# Patient Record
Sex: Female | Born: 1935
Health system: Southern US, Community
[De-identification: ages and names within clinical notes are randomized; demographics above are authoritative.]

## PROBLEM LIST (undated history)

## (undated) DIAGNOSIS — E119 Type 2 diabetes mellitus without complications: Secondary | ICD-10-CM

## (undated) DIAGNOSIS — I10 Essential (primary) hypertension: Secondary | ICD-10-CM

## (undated) DIAGNOSIS — H269 Unspecified cataract: Secondary | ICD-10-CM

## (undated) DIAGNOSIS — J449 Chronic obstructive pulmonary disease, unspecified: Secondary | ICD-10-CM

## (undated) DIAGNOSIS — I428 Other cardiomyopathies: Secondary | ICD-10-CM

## (undated) DIAGNOSIS — I509 Heart failure, unspecified: Secondary | ICD-10-CM

## (undated) DIAGNOSIS — I4891 Unspecified atrial fibrillation: Secondary | ICD-10-CM

## (undated) DIAGNOSIS — T7840XA Allergy, unspecified, initial encounter: Secondary | ICD-10-CM

## (undated) HISTORY — PX: OTHER SURGICAL HISTORY: SHX169

## (undated) HISTORY — DX: Allergy, unspecified, initial encounter: T78.40XA

## (undated) HISTORY — DX: Unspecified cataract: H26.9

## (undated) HISTORY — DX: Chronic obstructive pulmonary disease, unspecified: J44.9

## (undated) HISTORY — PX: CHOLECYSTECTOMY: SHX55

## (undated) HISTORY — DX: Heart failure, unspecified: I50.9

---

## 2006-09-20 ENCOUNTER — Ambulatory Visit (HOSPITAL_COMMUNITY): Admission: RE | Admit: 2006-09-20 | Discharge: 2006-09-20 | Payer: Self-pay | Admitting: Ophthalmology

## 2006-10-30 ENCOUNTER — Ambulatory Visit (HOSPITAL_COMMUNITY): Admission: RE | Admit: 2006-10-30 | Discharge: 2006-10-30 | Payer: Self-pay | Admitting: Ophthalmology

## 2006-11-20 ENCOUNTER — Ambulatory Visit (HOSPITAL_COMMUNITY): Admission: RE | Admit: 2006-11-20 | Discharge: 2006-11-20 | Payer: Self-pay | Admitting: Ophthalmology

## 2012-05-06 ENCOUNTER — Encounter (HOSPITAL_COMMUNITY): Payer: Self-pay | Admitting: Pharmacy Technician

## 2012-05-07 ENCOUNTER — Ambulatory Visit (HOSPITAL_COMMUNITY)
Admission: RE | Admit: 2012-05-07 | Discharge: 2012-05-07 | Disposition: A | Discharge status: Home / Self Care | Payer: Medicare Other | Source: Ambulatory Visit | Attending: Ophthalmology | Admitting: Ophthalmology

## 2012-05-07 ENCOUNTER — Encounter (HOSPITAL_COMMUNITY): Payer: Self-pay

## 2012-05-07 ENCOUNTER — Encounter (HOSPITAL_COMMUNITY)
Admission: RE | Disposition: A | Discharge status: Home / Self Care | Payer: Self-pay | Source: Ambulatory Visit | Attending: Ophthalmology

## 2012-05-07 DIAGNOSIS — H26499 Other secondary cataract, unspecified eye: Secondary | ICD-10-CM | POA: Insufficient documentation

## 2012-05-07 SURGERY — TREATMENT, USING YAG LASER
Anesthesia: LOCAL | Laterality: Left

## 2012-05-07 MED ORDER — TROPICAMIDE 1 % OP SOLN
1.0000 [drp] | OPHTHALMIC | Status: AC
  Administered 2012-05-07 (×2): 1 [drp] via OPHTHALMIC

## 2012-05-07 MED ORDER — TROPICAMIDE 1 % OP SOLN
OPHTHALMIC | Status: AC
  Filled 2012-05-07: qty 3

## 2012-05-07 NOTE — Brief Op Note (Signed)
Leslie Kim 05/07/2012  [name] T. Nile Riggs, MD  Yag Laser Self Test Completedyes. Procedure: Posterior Capsulotomy, left eye.  Eye Protection Worn by Staff yes. Laser In Use Sign on Door yes.  Laser: Nd:YAG Spot Size: Fixed Burst Mode: III Power Setting: 3.7 mJ/burst  Number of shots: 13 Total energy delivered: 46.4 mJ  Patency of the peripheral iridotomy was confirmed visually.  The patient tolerated the procedure without difficulty. No complications were encountered.    The patient was discharged home with the instructions to continue all her current glaucoma medications, if any.   Patient instructed to go to office at 0100 for intraocular pressure check.  Patient verbalizes understanding of discharge instructions yes.

## 2012-05-07 NOTE — H&P (Signed)
The patient was re examined and there is no change in the patients condition since the original H and P. 

## 2012-05-15 ENCOUNTER — Encounter (HOSPITAL_COMMUNITY): Payer: Self-pay

## 2012-05-21 ENCOUNTER — Encounter (HOSPITAL_COMMUNITY)
Admission: RE | Disposition: A | Discharge status: Home / Self Care | Payer: Self-pay | Source: Ambulatory Visit | Attending: Ophthalmology

## 2012-05-21 ENCOUNTER — Encounter (HOSPITAL_COMMUNITY): Payer: Self-pay | Admitting: *Deleted

## 2012-05-21 ENCOUNTER — Ambulatory Visit (HOSPITAL_COMMUNITY)
Admission: RE | Admit: 2012-05-21 | Discharge: 2012-05-21 | Disposition: A | Discharge status: Home / Self Care | Payer: Medicare Other | Source: Ambulatory Visit | Attending: Ophthalmology | Admitting: Ophthalmology

## 2012-05-21 DIAGNOSIS — H26499 Other secondary cataract, unspecified eye: Secondary | ICD-10-CM | POA: Insufficient documentation

## 2012-05-21 SURGERY — TREATMENT, USING YAG LASER
Anesthesia: LOCAL | Laterality: Right

## 2012-05-21 MED ORDER — TROPICAMIDE 1 % OP SOLN
1.0000 [drp] | OPHTHALMIC | Status: AC
  Administered 2012-05-21 (×2): 1 [drp] via OPHTHALMIC

## 2012-05-21 MED ORDER — TROPICAMIDE 1 % OP SOLN
OPHTHALMIC | Status: AC
  Filled 2012-05-21: qty 3

## 2012-05-21 NOTE — Brief Op Note (Signed)
Leslie Kim 05/21/2012  Jeremaih Klima T. Nile Riggs, MD  Yag Laser Self Test Completedyes. Procedure: Posterior Capsulotomy, left eye.  Eye Protection Worn by Staff yes. Laser In Use Sign on Door yes.  Laser: Nd:YAG Spot Size: Fixed Burst Mode: III Power Setting: 3.3 mJ/burst  Number of shots: 16 Total energy delivered: 53.0 mJ  Patency of the peripheral iridotomy was confirmed visually.  The patient tolerated the procedure without difficulty. No complications were encountered.    The patient was discharged home with the instructions to continue all her current glaucoma medications, if any.   Patient instructed to go to office at 0100 for intraocular pressure check.  Patient verbalizes understanding of discharge instructions yes.

## 2012-05-21 NOTE — H&P (Signed)
The patient was re examined and there is no change in the patients condition since the original H and P. 

## 2013-03-25 ENCOUNTER — Encounter (HOSPITAL_COMMUNITY): Payer: Self-pay | Admitting: Anesthesiology

## 2014-02-19 ENCOUNTER — Other Ambulatory Visit (HOSPITAL_COMMUNITY): Payer: Self-pay | Admitting: Pulmonary Disease

## 2014-02-19 DIAGNOSIS — Z780 Asymptomatic menopausal state: Secondary | ICD-10-CM

## 2014-02-27 ENCOUNTER — Other Ambulatory Visit (HOSPITAL_COMMUNITY): Payer: Medicare Other

## 2014-09-22 DIAGNOSIS — K210 Gastro-esophageal reflux disease with esophagitis: Secondary | ICD-10-CM | POA: Diagnosis not present

## 2014-09-22 DIAGNOSIS — E1121 Type 2 diabetes mellitus with diabetic nephropathy: Secondary | ICD-10-CM | POA: Diagnosis not present

## 2014-09-22 DIAGNOSIS — I10 Essential (primary) hypertension: Secondary | ICD-10-CM | POA: Diagnosis not present

## 2014-09-22 DIAGNOSIS — J449 Chronic obstructive pulmonary disease, unspecified: Secondary | ICD-10-CM | POA: Diagnosis not present

## 2014-12-21 DIAGNOSIS — Z23 Encounter for immunization: Secondary | ICD-10-CM | POA: Diagnosis not present

## 2014-12-21 DIAGNOSIS — Z0000 Encounter for general adult medical examination without abnormal findings: Secondary | ICD-10-CM | POA: Diagnosis not present

## 2015-03-23 DIAGNOSIS — K210 Gastro-esophageal reflux disease with esophagitis: Secondary | ICD-10-CM | POA: Diagnosis not present

## 2015-03-23 DIAGNOSIS — J449 Chronic obstructive pulmonary disease, unspecified: Secondary | ICD-10-CM | POA: Diagnosis not present

## 2015-03-23 DIAGNOSIS — E1121 Type 2 diabetes mellitus with diabetic nephropathy: Secondary | ICD-10-CM | POA: Diagnosis not present

## 2015-03-23 DIAGNOSIS — I10 Essential (primary) hypertension: Secondary | ICD-10-CM | POA: Diagnosis not present

## 2015-03-24 DIAGNOSIS — J449 Chronic obstructive pulmonary disease, unspecified: Secondary | ICD-10-CM | POA: Diagnosis not present

## 2015-03-24 DIAGNOSIS — K210 Gastro-esophageal reflux disease with esophagitis: Secondary | ICD-10-CM | POA: Diagnosis not present

## 2015-03-24 DIAGNOSIS — E1121 Type 2 diabetes mellitus with diabetic nephropathy: Secondary | ICD-10-CM | POA: Diagnosis not present

## 2015-03-24 DIAGNOSIS — I10 Essential (primary) hypertension: Secondary | ICD-10-CM | POA: Diagnosis not present

## 2015-06-15 DIAGNOSIS — Z23 Encounter for immunization: Secondary | ICD-10-CM | POA: Diagnosis not present

## 2015-06-15 DIAGNOSIS — K210 Gastro-esophageal reflux disease with esophagitis: Secondary | ICD-10-CM | POA: Diagnosis not present

## 2015-06-15 DIAGNOSIS — I10 Essential (primary) hypertension: Secondary | ICD-10-CM | POA: Diagnosis not present

## 2015-06-15 DIAGNOSIS — J449 Chronic obstructive pulmonary disease, unspecified: Secondary | ICD-10-CM | POA: Diagnosis not present

## 2015-06-15 DIAGNOSIS — E1121 Type 2 diabetes mellitus with diabetic nephropathy: Secondary | ICD-10-CM | POA: Diagnosis not present

## 2015-10-13 DIAGNOSIS — I10 Essential (primary) hypertension: Secondary | ICD-10-CM | POA: Diagnosis not present

## 2015-10-13 DIAGNOSIS — K210 Gastro-esophageal reflux disease with esophagitis: Secondary | ICD-10-CM | POA: Diagnosis not present

## 2015-10-13 DIAGNOSIS — E1121 Type 2 diabetes mellitus with diabetic nephropathy: Secondary | ICD-10-CM | POA: Diagnosis not present

## 2015-10-13 DIAGNOSIS — J449 Chronic obstructive pulmonary disease, unspecified: Secondary | ICD-10-CM | POA: Diagnosis not present

## 2015-11-29 DIAGNOSIS — I4891 Unspecified atrial fibrillation: Secondary | ICD-10-CM

## 2015-11-29 HISTORY — DX: Unspecified atrial fibrillation: I48.91

## 2015-12-18 ENCOUNTER — Inpatient Hospital Stay (HOSPITAL_COMMUNITY)
Admission: EM | Admit: 2015-12-18 | Discharge: 2015-12-24 | DRG: 286 | Disposition: A | Discharge status: Home / Self Care | Payer: Medicare Other | Attending: Internal Medicine | Admitting: Internal Medicine

## 2015-12-18 ENCOUNTER — Emergency Department (HOSPITAL_COMMUNITY): Payer: Medicare Other

## 2015-12-18 ENCOUNTER — Encounter (HOSPITAL_COMMUNITY): Payer: Self-pay

## 2015-12-18 ENCOUNTER — Inpatient Hospital Stay (HOSPITAL_COMMUNITY): Payer: Medicare Other

## 2015-12-18 DIAGNOSIS — I259 Chronic ischemic heart disease, unspecified: Secondary | ICD-10-CM | POA: Diagnosis not present

## 2015-12-18 DIAGNOSIS — I509 Heart failure, unspecified: Secondary | ICD-10-CM | POA: Diagnosis not present

## 2015-12-18 DIAGNOSIS — K219 Gastro-esophageal reflux disease without esophagitis: Secondary | ICD-10-CM | POA: Diagnosis not present

## 2015-12-18 DIAGNOSIS — N179 Acute kidney failure, unspecified: Secondary | ICD-10-CM | POA: Diagnosis not present

## 2015-12-18 DIAGNOSIS — Z79899 Other long term (current) drug therapy: Secondary | ICD-10-CM | POA: Diagnosis not present

## 2015-12-18 DIAGNOSIS — E119 Type 2 diabetes mellitus without complications: Secondary | ICD-10-CM | POA: Diagnosis present

## 2015-12-18 DIAGNOSIS — I4891 Unspecified atrial fibrillation: Secondary | ICD-10-CM | POA: Diagnosis present

## 2015-12-18 DIAGNOSIS — R000 Tachycardia, unspecified: Secondary | ICD-10-CM | POA: Diagnosis not present

## 2015-12-18 DIAGNOSIS — I272 Other secondary pulmonary hypertension: Secondary | ICD-10-CM | POA: Diagnosis not present

## 2015-12-18 DIAGNOSIS — I248 Other forms of acute ischemic heart disease: Secondary | ICD-10-CM | POA: Diagnosis present

## 2015-12-18 DIAGNOSIS — E875 Hyperkalemia: Secondary | ICD-10-CM | POA: Diagnosis not present

## 2015-12-18 DIAGNOSIS — I5023 Acute on chronic systolic (congestive) heart failure: Secondary | ICD-10-CM | POA: Diagnosis not present

## 2015-12-18 DIAGNOSIS — Z87891 Personal history of nicotine dependence: Secondary | ICD-10-CM | POA: Diagnosis not present

## 2015-12-18 DIAGNOSIS — I428 Other cardiomyopathies: Secondary | ICD-10-CM

## 2015-12-18 DIAGNOSIS — E876 Hypokalemia: Secondary | ICD-10-CM | POA: Diagnosis not present

## 2015-12-18 DIAGNOSIS — I959 Hypotension, unspecified: Secondary | ICD-10-CM | POA: Diagnosis not present

## 2015-12-18 DIAGNOSIS — Z7984 Long term (current) use of oral hypoglycemic drugs: Secondary | ICD-10-CM

## 2015-12-18 DIAGNOSIS — I447 Left bundle-branch block, unspecified: Secondary | ICD-10-CM | POA: Diagnosis not present

## 2015-12-18 DIAGNOSIS — I480 Paroxysmal atrial fibrillation: Secondary | ICD-10-CM | POA: Diagnosis not present

## 2015-12-18 DIAGNOSIS — I110 Hypertensive heart disease with heart failure: Secondary | ICD-10-CM | POA: Diagnosis present

## 2015-12-18 DIAGNOSIS — Z7982 Long term (current) use of aspirin: Secondary | ICD-10-CM | POA: Diagnosis not present

## 2015-12-18 DIAGNOSIS — I5020 Unspecified systolic (congestive) heart failure: Secondary | ICD-10-CM | POA: Diagnosis not present

## 2015-12-18 DIAGNOSIS — I481 Persistent atrial fibrillation: Secondary | ICD-10-CM | POA: Diagnosis not present

## 2015-12-18 DIAGNOSIS — J189 Pneumonia, unspecified organism: Secondary | ICD-10-CM | POA: Diagnosis present

## 2015-12-18 DIAGNOSIS — I5021 Acute systolic (congestive) heart failure: Secondary | ICD-10-CM | POA: Diagnosis not present

## 2015-12-18 DIAGNOSIS — I429 Cardiomyopathy, unspecified: Secondary | ICD-10-CM | POA: Diagnosis present

## 2015-12-18 DIAGNOSIS — J440 Chronic obstructive pulmonary disease with acute lower respiratory infection: Secondary | ICD-10-CM | POA: Diagnosis not present

## 2015-12-18 DIAGNOSIS — Z7901 Long term (current) use of anticoagulants: Secondary | ICD-10-CM

## 2015-12-18 DIAGNOSIS — I10 Essential (primary) hypertension: Secondary | ICD-10-CM | POA: Diagnosis not present

## 2015-12-18 DIAGNOSIS — R0602 Shortness of breath: Secondary | ICD-10-CM | POA: Diagnosis not present

## 2015-12-18 DIAGNOSIS — R7989 Other specified abnormal findings of blood chemistry: Secondary | ICD-10-CM | POA: Diagnosis not present

## 2015-12-18 DIAGNOSIS — E118 Type 2 diabetes mellitus with unspecified complications: Secondary | ICD-10-CM | POA: Diagnosis not present

## 2015-12-18 DIAGNOSIS — I2510 Atherosclerotic heart disease of native coronary artery without angina pectoris: Secondary | ICD-10-CM | POA: Diagnosis not present

## 2015-12-18 DIAGNOSIS — R079 Chest pain, unspecified: Secondary | ICD-10-CM | POA: Diagnosis not present

## 2015-12-18 HISTORY — DX: Unspecified atrial fibrillation: I48.91

## 2015-12-18 HISTORY — DX: Type 2 diabetes mellitus without complications: E11.9

## 2015-12-18 HISTORY — DX: Essential (primary) hypertension: I10

## 2015-12-18 HISTORY — DX: Other cardiomyopathies: I42.8

## 2015-12-18 LAB — CBG MONITORING, ED: Glucose-Capillary: 313 mg/dL — ABNORMAL HIGH (ref 65–99)

## 2015-12-18 LAB — GLUCOSE, CAPILLARY
Glucose-Capillary: 291 mg/dL — ABNORMAL HIGH (ref 65–99)
Glucose-Capillary: 202 mg/dL — ABNORMAL HIGH (ref 65–99)
Glucose-Capillary: 143 mg/dL — ABNORMAL HIGH (ref 65–99)

## 2015-12-18 LAB — I-STAT CG4 LACTIC ACID, ED: Lactic Acid, Venous: 2.87 mmol/L (ref 0.5–2.0)

## 2015-12-18 LAB — CBC WITH DIFFERENTIAL/PLATELET
Basophils Absolute: 0 10*3/uL (ref 0.0–0.1)
Basophils Relative: 0 %
Eosinophils Absolute: 0 10*3/uL (ref 0.0–0.7)
Eosinophils Relative: 0 %
HEMATOCRIT: 33.3 % — AB (ref 36.0–46.0)
HEMOGLOBIN: 10.9 g/dL — AB (ref 12.0–15.0)
LYMPHS ABS: 2 10*3/uL (ref 0.7–4.0)
Lymphocytes Relative: 15 %
MCH: 26.4 pg (ref 26.0–34.0)
MCHC: 32.7 g/dL (ref 30.0–36.0)
MCV: 80.6 fL (ref 78.0–100.0)
MONO ABS: 0.6 10*3/uL (ref 0.1–1.0)
MONOS PCT: 5 %
NEUTROS ABS: 10.8 10*3/uL — AB (ref 1.7–7.7)
NEUTROS PCT: 80 %
Platelets: 242 10*3/uL (ref 150–400)
RBC: 4.13 MIL/uL (ref 3.87–5.11)
RDW: 15 % (ref 11.5–15.5)
WBC: 13.4 10*3/uL — ABNORMAL HIGH (ref 4.0–10.5)

## 2015-12-18 LAB — TSH: TSH: 3.456 u[IU]/mL (ref 0.350–4.500)

## 2015-12-18 LAB — MRSA PCR SCREENING: MRSA BY PCR: NEGATIVE

## 2015-12-18 LAB — TROPONIN I
TROPONIN I: 0.06 ng/mL — AB (ref <0.031)
Troponin I: 0.05 ng/mL — ABNORMAL HIGH (ref <0.031)
Troponin I: 0.11 ng/mL — ABNORMAL HIGH (ref <0.031)

## 2015-12-18 LAB — BRAIN NATRIURETIC PEPTIDE: B Natriuretic Peptide: 661 pg/mL — ABNORMAL HIGH (ref 0.0–100.0)

## 2015-12-18 LAB — BASIC METABOLIC PANEL
Anion gap: 14 (ref 5–15)
BUN: 29 mg/dL — AB (ref 6–20)
CHLORIDE: 94 mmol/L — AB (ref 101–111)
CO2: 21 mmol/L — ABNORMAL LOW (ref 22–32)
CREATININE: 1.28 mg/dL — AB (ref 0.44–1.00)
Calcium: 9.2 mg/dL (ref 8.9–10.3)
GFR calc Af Amer: 46 mL/min — ABNORMAL LOW (ref >60)
GFR calc non Af Amer: 39 mL/min — ABNORMAL LOW (ref >60)
GLUCOSE: 282 mg/dL — AB (ref 65–99)
POTASSIUM: 3.7 mmol/L (ref 3.5–5.1)
Sodium: 129 mmol/L — ABNORMAL LOW (ref 135–145)

## 2015-12-18 LAB — ECHOCARDIOGRAM COMPLETE
Height: 64 in
WEIGHTICAEL: 2800 [oz_av]

## 2015-12-18 LAB — MAGNESIUM: Magnesium: 1.4 mg/dL — ABNORMAL LOW (ref 1.7–2.4)

## 2015-12-18 MED ORDER — ONDANSETRON HCL 4 MG/2ML IJ SOLN: 4.0000 mg | Freq: Four times a day (QID) | INTRAMUSCULAR | Status: DC | PRN

## 2015-12-18 MED ORDER — DEXTROSE 5 % IV SOLN
1.0000 g | INTRAVENOUS | Status: DC
  Administered 2015-12-19 – 2015-12-21 (×3): 1 g via INTRAVENOUS
  Filled 2015-12-18 (×4): qty 10

## 2015-12-18 MED ORDER — ENOXAPARIN SODIUM 80 MG/0.8ML ~~LOC~~ SOLN
80.0000 mg | Freq: Two times a day (BID) | SUBCUTANEOUS | Status: DC
  Administered 2015-12-18 – 2015-12-22 (×9): 80 mg via SUBCUTANEOUS
  Filled 2015-12-18 (×11): qty 0.8

## 2015-12-18 MED ORDER — DEXTROSE 5 % IV SOLN
500.0000 mg | Freq: Once | INTRAVENOUS | Status: AC
  Administered 2015-12-18: 500 mg via INTRAVENOUS
  Filled 2015-12-18: qty 500

## 2015-12-18 MED ORDER — AMIODARONE HCL IN DEXTROSE 360-4.14 MG/200ML-% IV SOLN: 30.0000 mg/h | INTRAVENOUS | Status: DC

## 2015-12-18 MED ORDER — GUAIFENESIN ER 600 MG PO TB12
1200.0000 mg | ORAL_TABLET | Freq: Two times a day (BID) | ORAL | Status: DC
  Administered 2015-12-18 – 2015-12-24 (×13): 1200 mg via ORAL
  Filled 2015-12-18 (×14): qty 2

## 2015-12-18 MED ORDER — DEXTROSE 5 % IV SOLN
500.0000 mg | INTRAVENOUS | Status: DC
  Administered 2015-12-19 – 2015-12-21 (×3): 500 mg via INTRAVENOUS
  Filled 2015-12-18 (×4): qty 500

## 2015-12-18 MED ORDER — LEVALBUTEROL HCL 0.63 MG/3ML IN NEBU
0.6300 mg | INHALATION_SOLUTION | Freq: Four times a day (QID) | RESPIRATORY_TRACT | Status: DC | PRN
  Administered 2015-12-18 – 2015-12-20 (×5): 0.63 mg via RESPIRATORY_TRACT
  Filled 2015-12-18 (×5): qty 3

## 2015-12-18 MED ORDER — ETOMIDATE 2 MG/ML IV SOLN
INTRAVENOUS | Status: AC
  Filled 2015-12-18: qty 10

## 2015-12-18 MED ORDER — ACETAMINOPHEN 325 MG PO TABS: 650.0000 mg | ORAL_TABLET | ORAL | Status: DC | PRN

## 2015-12-18 MED ORDER — AMIODARONE HCL IN DEXTROSE 360-4.14 MG/200ML-% IV SOLN: 60.0000 mg/h | INTRAVENOUS | Status: DC

## 2015-12-18 MED ORDER — AMIODARONE LOAD VIA INFUSION: 150.0000 mg | Freq: Once | INTRAVENOUS | Status: DC

## 2015-12-18 MED ORDER — SODIUM CHLORIDE 0.9 % IV BOLUS (SEPSIS)
500.0000 mL | Freq: Once | INTRAVENOUS | Status: AC
  Administered 2015-12-18: 500 mL via INTRAVENOUS

## 2015-12-18 MED ORDER — ASPIRIN EC 81 MG PO TBEC
81.0000 mg | DELAYED_RELEASE_TABLET | Freq: Every day | ORAL | Status: DC
  Administered 2015-12-18 – 2015-12-24 (×7): 81 mg via ORAL
  Filled 2015-12-18 (×7): qty 1

## 2015-12-18 MED ORDER — DILTIAZEM HCL 25 MG/5ML IV SOLN
INTRAVENOUS | Status: AC
  Administered 2015-12-18: 10 mg
  Filled 2015-12-18: qty 5

## 2015-12-18 MED ORDER — DILTIAZEM LOAD VIA INFUSION
10.0000 mg | Freq: Once | INTRAVENOUS | Status: AC
  Administered 2015-12-18: 10 mg via INTRAVENOUS
  Filled 2015-12-18: qty 10

## 2015-12-18 MED ORDER — MAGNESIUM SULFATE 2 GM/50ML IV SOLN
2.0000 g | INTRAVENOUS | Status: AC
  Administered 2015-12-18 (×2): 2 g via INTRAVENOUS
  Filled 2015-12-18 (×2): qty 50

## 2015-12-18 MED ORDER — SODIUM CHLORIDE 0.9% FLUSH
3.0000 mL | Freq: Two times a day (BID) | INTRAVENOUS | Status: DC
  Administered 2015-12-18 – 2015-12-23 (×8): 3 mL via INTRAVENOUS

## 2015-12-18 MED ORDER — DEXTROSE 5 % IV SOLN: 5.0000 mg/h | INTRAVENOUS | Status: DC

## 2015-12-18 MED ORDER — PANTOPRAZOLE SODIUM 40 MG PO TBEC
40.0000 mg | DELAYED_RELEASE_TABLET | Freq: Every day | ORAL | Status: DC
Start: 2015-12-18 — End: 2015-12-24
  Administered 2015-12-18 – 2015-12-24 (×7): 40 mg via ORAL
  Filled 2015-12-18 (×7): qty 1

## 2015-12-18 MED ORDER — VITAMIN B-12 1000 MCG PO TABS
1000.0000 ug | ORAL_TABLET | Freq: Every day | ORAL | Status: DC
  Administered 2015-12-18 – 2015-12-24 (×7): 1000 ug via ORAL
  Filled 2015-12-18 (×7): qty 1

## 2015-12-18 MED ORDER — SODIUM CHLORIDE 0.9% FLUSH: 3.0000 mL | INTRAVENOUS | Status: DC | PRN

## 2015-12-18 MED ORDER — ETOMIDATE 2 MG/ML IV SOLN
5.0000 mg | Freq: Once | INTRAVENOUS | Status: AC
  Administered 2015-12-18: 5 mg via INTRAVENOUS

## 2015-12-18 MED ORDER — SODIUM CHLORIDE 0.9 % IV SOLN
250.0000 mL | INTRAVENOUS | Status: DC | PRN
  Administered 2015-12-18: 10 mL via INTRAVENOUS
  Administered 2015-12-20: 250 mL via INTRAVENOUS

## 2015-12-18 MED ORDER — METOPROLOL TARTRATE 25 MG PO TABS
25.0000 mg | ORAL_TABLET | Freq: Two times a day (BID) | ORAL | Status: DC
  Administered 2015-12-18 – 2015-12-19 (×4): 25 mg via ORAL
  Filled 2015-12-18 (×5): qty 1

## 2015-12-18 MED ORDER — INSULIN ASPART 100 UNIT/ML ~~LOC~~ SOLN
0.0000 [IU] | Freq: Three times a day (TID) | SUBCUTANEOUS | Status: DC
  Administered 2015-12-18: 8 [IU] via SUBCUTANEOUS
  Administered 2015-12-18: 5 [IU] via SUBCUTANEOUS
  Administered 2015-12-19: 3 [IU] via SUBCUTANEOUS
  Administered 2015-12-19: 5 [IU] via SUBCUTANEOUS
  Administered 2015-12-19 – 2015-12-20 (×2): 2 [IU] via SUBCUTANEOUS
  Administered 2015-12-20 (×2): 3 [IU] via SUBCUTANEOUS
  Administered 2015-12-21: 5 [IU] via SUBCUTANEOUS
  Administered 2015-12-21: 2 [IU] via SUBCUTANEOUS
  Administered 2015-12-21 – 2015-12-23 (×6): 3 [IU] via SUBCUTANEOUS
  Administered 2015-12-23: 5 [IU] via SUBCUTANEOUS
  Administered 2015-12-24 (×2): 3 [IU] via SUBCUTANEOUS

## 2015-12-18 MED ORDER — FUROSEMIDE 10 MG/ML IJ SOLN
20.0000 mg | Freq: Two times a day (BID) | INTRAMUSCULAR | Status: DC
  Administered 2015-12-18 – 2015-12-21 (×6): 20 mg via INTRAVENOUS
  Filled 2015-12-18 (×6): qty 2

## 2015-12-18 MED ORDER — MAGNESIUM SULFATE 4 GM/100ML IV SOLN
4.0000 g | Freq: Once | INTRAVENOUS | Status: DC
Start: 2015-12-18 — End: 2015-12-18

## 2015-12-18 MED ORDER — INSULIN ASPART 100 UNIT/ML ~~LOC~~ SOLN
0.0000 [IU] | Freq: Every day | SUBCUTANEOUS | Status: DC
  Administered 2015-12-20 – 2015-12-23 (×3): 2 [IU] via SUBCUTANEOUS

## 2015-12-18 MED ORDER — CEFTRIAXONE SODIUM 1 G IJ SOLR
1.0000 g | Freq: Once | INTRAMUSCULAR | Status: AC
  Administered 2015-12-18: 1 g via INTRAVENOUS
  Filled 2015-12-18: qty 10

## 2015-12-18 MED ORDER — DILTIAZEM HCL 100 MG IV SOLR
INTRAVENOUS | Status: AC
  Filled 2015-12-18: qty 100

## 2015-12-18 NOTE — ED Notes (Cosign Needed)
Pt BP 106/88. Dr Rubin Payor notified. Decrease Cardizem 5mg  and continue to monitor pt

## 2015-12-18 NOTE — ED Notes (Signed)
Pt BP 133/95 HR 175. Dr Rubin Payor notified order to Increase Cardizem 10 mg IV

## 2015-12-18 NOTE — ED Notes (Signed)
BP 63/40. Dr Rubin Payor notified and at bedside at this time. Pt reports feeling well. Pale in color

## 2015-12-18 NOTE — H&P (Signed)
History and Physical    Leslie Kim:[id number] DOB: 08/15/1935 DOA: 12/18/2015  Referring MD/NP/PA: Benjiman Core, MD  PCP: Fredirick Maudlin, MD  Outpatient Specialists:  Patient coming from: Home  Chief Complaint: Shortness of breath   HPI: Leslie Kim is a 80 y.o. female with medical history significant of COPD, HTN and DM presented with complaints of shortness of breath that onset three days ago. She has felt feverish but has no document fever. She has a nonproductive cough. Denied chest pain or palpations. She also denied nausea, vomiting or diarrhea, new medications Although she denies outpatient LE edema, she feels her legs are more swollen since arrival to ED. Since her SOB progressively became worse she came to the ED for evaluation.   ED Course: In the ED she was noted to have uncontrolled HR in the 180's and EKG revealed new onset atrial fibrillation. CXR revealed bilateral infiltrate consistent with CHF vs PNA. BNP noted to be elevated at 661. She was started on Cardizem drip with no improvement. Since she remained tachycardiac and was becoming hypotensive. Cardizem drip was discontinued and she was successfully cardioverted by EDP. She has been referred for admission.    Review of Systems: As per HPI otherwise 10 point review of systems negative.    Past Medical History  Diagnosis Date  . Diabetes mellitus without complication (HCC)   . Hypertension     Past Surgical History  Procedure Laterality Date  . Cholecystectomy       reports that she quit smoking about 8 years ago. She does not have any smokeless tobacco history on file. She reports that she does not drink alcohol or use illicit drugs.  Allergies  Allergen Reactions  . Codeine Other (See Comments)    Too strong for patient      No family history on file.   Prior to Admission medications   Medication Sig Start Date End Date Taking? Authorizing Provider  acetaminophen (TYLENOL) 500 MG tablet  Take 1,000 mg by mouth every 6 (six) hours as needed. Pain    Historical Provider, MD  aspirin EC 81 MG tablet Take 81 mg by mouth daily.    Historical Provider, MD  Cranberry 500 MG CAPS Take 1 capsule by mouth daily.    Historical Provider, MD  esomeprazole (NEXIUM) 40 MG capsule Take 40 mg by mouth daily.    Historical Provider, MD  Olmesartan-Amlodipine-HCTZ (TRIBENZOR) 40-10-25 MG TABS Take 1 tablet by mouth daily.    Historical Provider, MD  sitaGLIPtan-metformin (JANUMET) 50-1000 MG per tablet Take 1 tablet by mouth 2 (two) times daily.    Historical Provider, MD  vitamin B-12 (CYANOCOBALAMIN) 1000 MCG tablet Take 1,000 mcg by mouth daily.    Historical Provider, MD    Physical Exam: Filed Vitals:   12/18/15 0745 12/18/15 0755 12/18/15 0815 12/18/15 0832  BP: 140/105 122/79 107/72 120/95  Pulse: 32 80 105 54  Temp:      Resp: 29 28 27 30   Height:      Weight:      SpO2: 97% 95% 96% 97%      Constitutional: NAD, calm, comfortable Filed Vitals:   12/18/15 0745 12/18/15 0755 12/18/15 0815 12/18/15 0832  BP: 140/105 122/79 107/72 120/95  Pulse: 32 80 105 54  Temp:      Resp: 29 28 27 30   Height:      Weight:      SpO2: 97% 95% 96% 97%   Eyes: PERRL, lids and  conjunctivae normal ENMT: Mucous membranes are moist. Posterior pharynx clear of any exudate or lesions.Normal dentition.  Neck: normal, supple, no masses, no thyromegaly Respiratory: Crackles at bases. Increased respiratory effort.   Cardiovascular: Irregular. No murmurs / rubs / gallops. 1+ LE edema. 2+ pedal pulses. No carotid bruits.  Abdomen: no tenderness, no masses palpated. No hepatosplenomegaly. Bowel sounds positive.  Musculoskeletal: no clubbing / cyanosis. No joint deformity upper and lower extremities. Good ROM, no contractures. Normal muscle tone.  Skin: no rashes, lesions, ulcers. No induration Neurologic: CN 2-12 grossly intact. Sensation intact, DTR normal. Strength 5/5 in all 4.  Psychiatric: Normal  judgment and insight. Alert and oriented x 3. Normal mood.   Labs on Admission: I have personally reviewed following labs and imaging studies  CBC:  Recent Labs Lab 12/18/15 0650  WBC 13.4*  NEUTROABS 10.8*  HGB 10.9*  HCT 33.3*  MCV 80.6  PLT 242   Basic Metabolic Panel:  Recent Labs Lab 12/18/15 0650  NA 129*  K 3.7  CL 94*  CO2 21*  GLUCOSE 282*  BUN 29*  CREATININE 1.28*  CALCIUM 9.2     Recent Labs Lab 12/18/15 0650  TROPONINI 0.05*    CBG:  Recent Labs Lab 12/18/15 0742  GLUCAP 313*    Radiological Exams on Admission: Dg Chest Portable 1 View  12/18/2015  CLINICAL DATA:  Chest pain EXAM: PORTABLE CHEST 1 VIEW COMPARISON:  None. FINDINGS: Mild focal/patchy opacity in the right upper lobe. Additional patchy opacities in the bilateral lower lobes. Overall, this appearance is considered suspicious for multifocal pneumonia. No definite pleural effusions.  No pneumothorax. Heart is normal in size. IMPRESSION: Mild patchy opacities in the right upper lobe and bilateral lower lobes, suspicious for pneumonia. Electronically Signed   By: Charline Bills M.D.   On: 12/18/2015 08:01    EKG: Independently reviewed. Afib with RVR.  Assessment/Plan Active Problems:   Atrial fibrillation with RVR (HCC)   HTN (hypertension)   Diabetes mellitus (HCC)                                                                                                                                   1. Atrial fibrillation with RVR, she has been cardioverted and is now back in SR. We will start low dose lopressor to help maintain SR. Has [place] VAS score of at least 5. She will be started on anticoagulation with Lovenox, with likely transition to [facility]. Will check TSH and cycle cardiac markers.  2. Possible CAP. Started on empiric abx. BC in process. Afebrile, mild leukocytosis. Elevated lactic acid likely related to extreme tachycardia as opposed to sepsis.  3. AKI, Creatinine elevated  will continue to monitor in the setting of diuresis.  4. Acute CHF. ECHO has been order to assess LV function. She has been started on low dose IV lasix. Monitor intake and output. She is also on low-dose BB. Will restart  ARB once, hemodynamics have shown stability.  5. COPD. No significant wheezing at this time. Will continue on bronchodilators  6. Essential HTN. hypotensive due to tachycardia. Blood pressure is now stable. Will hold antihypertensives for now.  7. DM type 2. Will hold oral agents and start SSI.    DVT prophylaxis: Lovenox.  Code Status: Full  Family Communication: Husband and son bedside.  Disposition Plan: Admit to SDU Consults called: None Admission status: Inpatient   Erick Blinks, MD  [facility] Pager [phone]  If 7PM-7AM, please contact night-coverage [email or web address] Password [email or web address]  12/18/2015, 8:48 AM     By signing my name below, I, Zadie Cleverly, attest that this documentation has been prepared under the direction and in the presence of Erick Blinks, MD. Electronically signed: Zadie Cleverly, Scribe. 12/18/2015 10:10am   I, Dr. Erick Blinks, personally performed the services described in this documentaiton. All medical record entries made by the scribe were at my direction and in my presence. I have reviewed the chart and agree that the record reflects my personal performance and is accurate and complete  Erick Blinks, MD, 12/18/2015 10:39 AM

## 2015-12-18 NOTE — ED Notes (Cosign Needed)
Dr Rubin Payor notified of BP 107/72. No change in cardizem  at this time

## 2015-12-18 NOTE — Sedation Documentation (Signed)
Pt delivered 2 shocks converted to sinus rhythm after 2nd shock

## 2015-12-18 NOTE — ED Notes (Signed)
Pt reports having shortness of breath a few days ago that has progressively got worse.

## 2015-12-18 NOTE — ED Notes (Cosign Needed)
Pt signed consent for conscious sedation and cardioversion

## 2015-12-18 NOTE — ED Provider Notes (Signed)
CSN: [id number]     Arrival date & time 12/18/15  0761 History   First MD Initiated Contact with Patient 12/18/15 [phone]     Chief Complaint  Patient presents with  . Shortness of Breath    Level 5 caveat due to shortness of breath.  Patient is a 80 y.o. female presenting with shortness of breath. The history is provided by the patient.  Shortness of Breath Associated symptoms: no chest pain and no cough   Patient presents with shortness of breath. Has had it for the last 3 days. No real cough. No real chest pain. States she's had decreased activity. No fevers or chills. No sick contacts. She states she's had less physical activity loss appetite. No dysuria. Found to have a new onset A. fib with RVR. Patient states she does not feel her heart racing. No history of atrial fibrillation.  Past Medical History  Diagnosis Date  . Diabetes mellitus without complication (HCC)   . Hypertension    Past Surgical History  Procedure Laterality Date  . Cholecystectomy     No family history on file. Social History  Substance Use Topics  . Smoking status: Former Smoker    Quit date: 12/18/2007  . Smokeless tobacco: None  . Alcohol Use: No   OB History    No data available     Review of Systems  Unable to perform ROS: Severe respiratory distress  Constitutional: Positive for appetite change and fatigue.  Respiratory: Positive for shortness of breath. Negative for cough.   Cardiovascular: Negative for chest pain and leg swelling.  Genitourinary: Negative for dysuria.  Musculoskeletal: Negative for back pain.      Allergies  Codeine  Home Medications   Prior to Admission medications   Medication Sig Start Date End Date Taking? Authorizing Provider  acetaminophen (TYLENOL) 500 MG tablet Take 1,000 mg by mouth every 6 (six) hours as needed. Pain   Yes Historical Provider, MD  aspirin EC 81 MG tablet Take 81 mg by mouth daily.   Yes Historical Provider, MD  Cranberry 500 MG CAPS Take 1  capsule by mouth daily.   Yes Historical Provider, MD  esomeprazole (NEXIUM) 40 MG capsule Take 40 mg by mouth daily.   Yes Historical Provider, MD  Olmesartan-Amlodipine-HCTZ (TRIBENZOR) 40-10-25 MG TABS Take 1 tablet by mouth daily.   Yes Historical Provider, MD  sitaGLIPtan-metformin (JANUMET) 50-1000 MG per tablet Take 1 tablet by mouth 2 (two) times daily.   Yes Historical Provider, MD  vitamin B-12 (CYANOCOBALAMIN) 1000 MCG tablet Take 1,000 mcg by mouth daily.   Yes Historical Provider, MD   BP 135/80 mmHg  Pulse 81  Temp(Src) 97.8 F (36.6 C)  Resp 25  Ht 5\' 4"  (1.626 m)  Wt 175 lb (79.379 kg)  BMI 30.02 kg/m2  SpO2 97% Physical Exam  Constitutional: She appears well-developed.  HENT:  Head: Atraumatic.  Eyes: EOM are normal.  Neck: Neck supple.  Cardiovascular:  Regular tachycardia  Pulmonary/Chest:  Tachypnea without wheezes or rales  Abdominal: Soft. There is no tenderness.  Musculoskeletal: She exhibits no edema.  Neurological: She is alert.  Skin: Skin is warm.    ED Course  .Cardioversion Date/Time: 12/18/2015 10:00 AM Performed by: Benjiman Core Authorized by: Benjiman Core Consent: Verbal consent obtained. Written consent obtained. Risks and benefits: risks, benefits and alternatives were discussed Consent given by: patient Patient understanding: patient states understanding of the procedure being performed Patient consent: the patient's understanding of the procedure matches consent  given Imaging studies: imaging studies available Required items: required blood products, implants, devices, and special equipment available Patient identity confirmed: verbally with patient and arm band Time out: Immediately prior to procedure a "time out" was called to verify the correct patient, procedure, equipment, support staff and site/side marked as required. Patient sedated: yes Sedation type: moderate (conscious) sedation Sedatives: etomidate Vitals: Vital  signs were monitored during sedation. (5 minutes of sedation) Cardioversion basis: emergent Pre-procedure rhythm: atrial fibrillation Chest area: chest area exposed Electrodes: pads Electrodes placed: anterior-posterior Number of attempts: 2 Attempt 1 mode: synchronous Attempt 1 waveform: biphasic Attempt 1 shock (in Joules): 120 Attempt 1 outcome: no change in rhythm Attempt 2 mode: synchronous Attempt 2 waveform: biphasic Attempt 2 shock (in Joules): 150 Attempt 2 outcome: conversion to normal sinus rhythm Post-procedure rhythm: normal sinus rhythm Complications: no complications Patient tolerance: Patient tolerated the procedure well with no immediate complications   (including critical care time) Labs Review Labs Reviewed  BASIC METABOLIC PANEL - Abnormal; Notable for the following:    Sodium 129 (*)    Chloride 94 (*)    CO2 21 (*)    Glucose, Bld 282 (*)    BUN 29 (*)    Creatinine, Ser 1.28 (*)    GFR calc non Af Amer 39 (*)    GFR calc Af Amer 46 (*)    All other components within normal limits  TROPONIN I - Abnormal; Notable for the following:    Troponin I 0.05 (*)    All other components within normal limits  BRAIN NATRIURETIC PEPTIDE - Abnormal; Notable for the following:    B Natriuretic Peptide 661.0 (*)    All other components within normal limits  CBC WITH DIFFERENTIAL/PLATELET - Abnormal; Notable for the following:    WBC 13.4 (*)    Hemoglobin 10.9 (*)    HCT 33.3 (*)    Neutro Abs 10.8 (*)    All other components within normal limits  CBG MONITORING, ED - Abnormal; Notable for the following:    Glucose-Capillary 313 (*)    All other components within normal limits  I-STAT CG4 LACTIC ACID, ED - Abnormal; Notable for the following:    Lactic Acid, Venous 2.87 (*)    All other components within normal limits  CULTURE, BLOOD (ROUTINE X 2)  CULTURE, BLOOD (ROUTINE X 2)    Imaging Review Dg Chest Portable 1 View  12/18/2015  CLINICAL DATA:  Chest  pain EXAM: PORTABLE CHEST 1 VIEW COMPARISON:  None. FINDINGS: Mild focal/patchy opacity in the right upper lobe. Additional patchy opacities in the bilateral lower lobes. Overall, this appearance is considered suspicious for multifocal pneumonia. No definite pleural effusions.  No pneumothorax. Heart is normal in size. IMPRESSION: Mild patchy opacities in the right upper lobe and bilateral lower lobes, suspicious for pneumonia. Electronically Signed   By: Charline Bills M.D.   On: 12/18/2015 08:01   I have personally reviewed and evaluated these images and lab results as part of my medical decision-making.   EKG Interpretation   Date/Time:  Saturday Dec 18 2015 09:56:00 EDT Ventricular Rate:  79 PR Interval:  125 QRS Duration: 117 QT Interval:  392 QTC Calculation: 449 R Axis:   69 Text Interpretation:  Sinus rhythm Nonspecific intraventricular conduction  delay Anterior infarct, old Repol abnrm suggests ischemia, diffuse leads  Afib is now sinus Confirmed by Rubin Payor  MD, Harrold Donath [phone]) on 12/18/2015  10:12:11 AM      MDM   Final  diagnoses:  Atrial fibrillation with RVR (HCC)  CAP (community acquired pneumonia)    Patient presented with weakness and shortness of breath. Began around 3 days ago. Found to be in new onset A. fib with RVR. Lab work showed minimally elevated troponin and BNP elevated. Chest x-ray showed was called with multifocal pneumonia. She's had a occasional cough but no production. No fevers. Could be edema. Patient became more hypotensive and was not tolerating Cardizem drip. Cardizem drip stopping fluid boluses given and she was still somewhat hypotensive. Decision made with patient 2 proceed with electrical cardioversion. On second attempt at 150 J after first attempt 120 patient converted to sinus rhythm patient felt somewhat better and will be admitted to step down.  Chadsvasc score of 5.  CRITICAL CARE Performed by: Billee Cashing Total critical care  time: 30 minutes Critical care time was exclusive of separately billable procedures and treating other patients. Critical care was necessary to treat or prevent imminent or life-threatening deterioration. Critical care was time spent personally by me on the following activities: development of treatment plan with patient and/or surrogate as well as nursing, discussions with consultants, evaluation of patient's response to treatment, examination of patient, obtaining history from patient or surrogate, ordering and performing treatments and interventions, ordering and review of laboratory studies, ordering and review of radiographic studies, pulse oximetry and re-evaluation of patient's condition.    Benjiman Core, MD 12/18/15 1034

## 2015-12-18 NOTE — ED Notes (Cosign Needed)
BP 74/57 dr Britt Boozer notified. Order to stop cardizem bolus 500 NS

## 2015-12-18 NOTE — Progress Notes (Signed)
ANTICOAGULATION CONSULT NOTE - Initial Consult  Pharmacy Consult for Lovenox Indication: atrial fibrillation  Allergies  Allergen Reactions  . Codeine Other (See Comments)    Too strong for patient      Patient Measurements: Height: 5\' 4"  (162.6 cm) Weight: 175 lb (79.379 kg) IBW/kg (Calculated) : 54.7 Heparin Dosing Weight: 79.4Kg  Vital Signs: Temp: 97.2 F (36.2 C) (05/20 1138) Temp Source: Oral (05/20 1138) BP: 115/73 mmHg (05/20 1050) Pulse Rate: 87 (05/20 1050)  Labs:  Recent Labs  12/18/15 0650  HGB 10.9*  HCT 33.3*  PLT 242  CREATININE 1.28*  TROPONINI 0.05*    Estimated Creatinine Clearance: 37.5 mL/min (by C-G formula based on Cr of 1.28).   Medical History: Past Medical History  Diagnosis Date  . Diabetes mellitus without complication (HCC)   . Hypertension     Medications:  Prescriptions prior to admission  Medication Sig Dispense Refill Last Dose  . acetaminophen (TYLENOL) 500 MG tablet Take 1,000 mg by mouth every 6 (six) hours as needed. Pain   unknown  . aspirin EC 81 MG tablet Take 81 mg by mouth daily.   12/16/2015  . Cranberry 500 MG CAPS Take 1 capsule by mouth daily.   12/16/2015  . esomeprazole (NEXIUM) 40 MG capsule Take 40 mg by mouth daily.   12/16/2015  . Olmesartan-Amlodipine-HCTZ (TRIBENZOR) 40-10-25 MG TABS Take 1 tablet by mouth daily.   12/17/2015 at Unknown time  . sitaGLIPtan-metformin (JANUMET) 50-1000 MG per tablet Take 1 tablet by mouth 2 (two) times daily.   12/16/2015  . vitamin B-12 (CYANOCOBALAMIN) 1000 MCG tablet Take 1,000 mcg by mouth daily.   12/16/2015    Assessment: 79yo female.  Asked to initiate Lovenox for afib.  clcr > 30  Goal of Therapy:  Anti-Xa level 0.6-1 units/ml 4hrs after LMWH dose given Monitor platelets by anticoagulation protocol: Yes   Plan:  Lovenox 1mg /Kg SQ q12hrs Monitor CBC, s/sx of bleeding complications  Margo Aye, Siera Beyersdorf A 12/18/2015,11:48 AM

## 2015-12-18 NOTE — ED Notes (Signed)
Cultures obtained prior to starting zithromax IV

## 2015-12-18 NOTE — Progress Notes (Signed)
*  PRELIMINARY RESULTS* Echocardiogram 2D Echocardiogram has been performed.  Doristine Section 12/18/2015, 1:58 PM

## 2015-12-19 LAB — CBC
HCT: 30.8 % — ABNORMAL LOW (ref 36.0–46.0)
Hemoglobin: 10.1 g/dL — ABNORMAL LOW (ref 12.0–15.0)
MCH: 26.6 pg (ref 26.0–34.0)
MCHC: 32.8 g/dL (ref 30.0–36.0)
MCV: 81.1 fL (ref 78.0–100.0)
Platelets: 202 10*3/uL (ref 150–400)
RBC: 3.8 MIL/uL — AB (ref 3.87–5.11)
RDW: 14.6 % (ref 11.5–15.5)
WBC: 11.6 10*3/uL — ABNORMAL HIGH (ref 4.0–10.5)

## 2015-12-19 LAB — GLUCOSE, CAPILLARY
GLUCOSE-CAPILLARY: 183 mg/dL — AB (ref 65–99)
GLUCOSE-CAPILLARY: 169 mg/dL — AB (ref 65–99)
Glucose-Capillary: 238 mg/dL — ABNORMAL HIGH (ref 65–99)
Glucose-Capillary: 149 mg/dL — ABNORMAL HIGH (ref 65–99)

## 2015-12-19 LAB — BASIC METABOLIC PANEL
Anion gap: 11 (ref 5–15)
BUN: 23 mg/dL — ABNORMAL HIGH (ref 6–20)
CALCIUM: 8.6 mg/dL — AB (ref 8.9–10.3)
CO2: 25 mmol/L (ref 22–32)
CREATININE: 1.05 mg/dL — AB (ref 0.44–1.00)
Chloride: 98 mmol/L — ABNORMAL LOW (ref 101–111)
GFR calc non Af Amer: 50 mL/min — ABNORMAL LOW (ref >60)
GFR, EST AFRICAN AMERICAN: 58 mL/min — AB (ref >60)
Glucose, Bld: 183 mg/dL — ABNORMAL HIGH (ref 65–99)
Potassium: 3 mmol/L — ABNORMAL LOW (ref 3.5–5.1)
Sodium: 134 mmol/L — ABNORMAL LOW (ref 135–145)

## 2015-12-19 LAB — MAGNESIUM: Magnesium: 2 mg/dL (ref 1.7–2.4)

## 2015-12-19 LAB — TROPONIN I: TROPONIN I: 0.15 ng/mL — AB (ref <0.031)

## 2015-12-19 MED ORDER — POTASSIUM CHLORIDE CRYS ER 20 MEQ PO TBCR
40.0000 meq | EXTENDED_RELEASE_TABLET | Freq: Two times a day (BID) | ORAL | Status: DC
  Administered 2015-12-19 (×2): 40 meq via ORAL
  Filled 2015-12-19 (×2): qty 2

## 2015-12-19 NOTE — Progress Notes (Signed)
Subjective: She was admitted yesterday with atrial fibrillation rapid ventricular response. She says she feels better. She was cardioverted in the emergency department. She had not been responding to medical treatment. This morning she says she feels better. She had low magnesium and low potassium. Echocardiogram showed evidence of systolic heart failure plus some dyskinesia of her cardiac muscle. She has not been known to have cardiac disease in the past but has had COPD. She also has what appears to be pulmonary hypertension. Chest x-ray showed an infiltrate and there was concern about whether this was heart failure and she did have an elevated BNP but she is also coughing and may have pneumonia.  Objective: Vital signs in last 24 hours: Temp:  [97 F (36.1 C)-98.1 F (36.7 C)] 97.2 F (36.2 C) (05/21 0840) Pulse Rate:  [66-163] 83 (05/21 0800) Resp:  [15-32] 32 (05/21 0800) BP: (78-161)/(11-126) 121/63 mmHg (05/21 0800) SpO2:  [89 %-100 %] 96 % (05/21 0800) FiO2 (%):  [32 %] 32 % (05/21 0840) Weight:  [79.4 kg (175 lb 0.7 oz)] 79.4 kg (175 lb 0.7 oz) (05/21 0500) Weight change: 0.02 kg (0.7 oz) Last BM Date: 12/17/15  Intake/Output from previous day: 05/20 0701 - 05/21 0700 In: 50.5 [I.V.:50.5] Out: 475 [Urine:475]  PHYSICAL EXAM General appearance: alert, cooperative and no distress Resp: rhonchi bilaterally and More on the right than on the left Cardio: regular rate and rhythm, S1, S2 normal, no murmur, click, rub or gallop GI: soft, non-tender; bowel sounds normal; no masses,  no organomegaly Extremities: extremities normal, atraumatic, no cyanosis or edema  Lab Results:  Results for orders placed or performed during the hospital encounter of 12/18/15 (from the past 48 hour(s))  Basic metabolic panel     Status: Abnormal   Collection Time: 12/18/15  6:50 AM  Result Value Ref Range   Sodium 129 (L) 135 - 145 mmol/L   Potassium 3.7 3.5 - 5.1 mmol/L   Chloride 94 (L) 101 -  111 mmol/L   CO2 21 (L) 22 - 32 mmol/L   Glucose, Bld 282 (H) 65 - 99 mg/dL   BUN 29 (H) 6 - 20 mg/dL   Creatinine, Ser 1.28 (H) 0.44 - 1.00 mg/dL   Calcium 9.2 8.9 - 10.3 mg/dL   GFR calc non Af Amer 39 (L) >60 mL/min   GFR calc Af Amer 46 (L) >60 mL/min    Comment: (NOTE) The eGFR has been calculated using the CKD EPI equation. This calculation has not been validated in all clinical situations. eGFR's persistently <60 mL/min signify possible Chronic Kidney Disease.    Anion gap 14 5 - 15  Troponin I     Status: Abnormal   Collection Time: 12/18/15  6:50 AM  Result Value Ref Range   Troponin I 0.05 (H) <0.031 ng/mL    Comment:        PERSISTENTLY INCREASED TROPONIN VALUES IN THE RANGE OF 0.04-0.49 ng/mL CAN BE SEEN IN:       -UNSTABLE ANGINA       -CONGESTIVE HEART FAILURE       -MYOCARDITIS       -CHEST TRAUMA       -ARRYHTHMIAS       -LATE PRESENTING MYOCARDIAL INFARCTION       -COPD   CLINICAL FOLLOW-UP RECOMMENDED.   Brain natriuretic peptide     Status: Abnormal   Collection Time: 12/18/15  6:50 AM  Result Value Ref Range   B Natriuretic Peptide 661.0 (H)  0.0 - 100.0 pg/mL  CBC with Differential     Status: Abnormal   Collection Time: 12/18/15  6:50 AM  Result Value Ref Range   WBC 13.4 (H) 4.0 - 10.5 K/uL   RBC 4.13 3.87 - 5.11 MIL/uL   Hemoglobin 10.9 (L) 12.0 - 15.0 g/dL   HCT 33.3 (L) 36.0 - 46.0 %   MCV 80.6 78.0 - 100.0 fL   MCH 26.4 26.0 - 34.0 pg   MCHC 32.7 30.0 - 36.0 g/dL   RDW 15.0 11.5 - 15.5 %   Platelets 242 150 - 400 K/uL   Neutrophils Relative % 80 %   Neutro Abs 10.8 (H) 1.7 - 7.7 K/uL   Lymphocytes Relative 15 %   Lymphs Abs 2.0 0.7 - 4.0 K/uL   Monocytes Relative 5 %   Monocytes Absolute 0.6 0.1 - 1.0 K/uL   Eosinophils Relative 0 %   Eosinophils Absolute 0.0 0.0 - 0.7 K/uL   Basophils Relative 0 %   Basophils Absolute 0.0 0.0 - 0.1 K/uL  CBG monitoring, ED     Status: Abnormal   Collection Time: 12/18/15  7:42 AM  Result Value Ref  Range   Glucose-Capillary 313 (H) 65 - 99 mg/dL  Culture, blood (routine x 2)     Status: None (Preliminary result)   Collection Time: 12/18/15  8:17 AM  Result Value Ref Range   Specimen Description BLOOD LEFT ANTECUBITAL    Special Requests BOTTLES DRAWN AEROBIC AND ANAEROBIC 6CC    Culture NO GROWTH < 24 HOURS    Report Status PENDING   Culture, blood (routine x 2)     Status: None (Preliminary result)   Collection Time: 12/18/15  8:24 AM  Result Value Ref Range   Specimen Description BLOOD BLOOD RIGHT HAND    Special Requests BOTTLES DRAWN AEROBIC ONLY 6CC    Culture NO GROWTH < 24 HOURS    Report Status PENDING   I-Stat CG4 Lactic Acid, ED     Status: Abnormal   Collection Time: 12/18/15  8:30 AM  Result Value Ref Range   Lactic Acid, Venous 2.87 (HH) 0.5 - 2.0 mmol/L   Comment NOTIFIED PHYSICIAN   MRSA PCR Screening     Status: None   Collection Time: 12/18/15 11:13 AM  Result Value Ref Range   MRSA by PCR NEGATIVE NEGATIVE    Comment:        The GeneXpert MRSA Assay (FDA approved for NASAL specimens only), is one component of a comprehensive MRSA colonization surveillance program. It is not intended to diagnose MRSA infection nor to guide or monitor treatment for MRSA infections.   Glucose, capillary     Status: Abnormal   Collection Time: 12/18/15 11:21 AM  Result Value Ref Range   Glucose-Capillary 291 (H) 65 - 99 mg/dL   Comment 1 Notify RN    Comment 2 Document in Chart   TSH     Status: None   Collection Time: 12/18/15 11:33 AM  Result Value Ref Range   TSH 3.456 0.350 - 4.500 uIU/mL  Troponin I     Status: Abnormal   Collection Time: 12/18/15 11:33 AM  Result Value Ref Range   Troponin I 0.06 (H) <0.031 ng/mL    Comment:        PERSISTENTLY INCREASED TROPONIN VALUES IN THE RANGE OF 0.04-0.49 ng/mL CAN BE SEEN IN:       -UNSTABLE ANGINA       -CONGESTIVE HEART FAILURE       -  MYOCARDITIS       -CHEST TRAUMA       -ARRYHTHMIAS       -LATE  PRESENTING MYOCARDIAL INFARCTION       -COPD   CLINICAL FOLLOW-UP RECOMMENDED.   Magnesium     Status: Abnormal   Collection Time: 12/18/15 11:33 AM  Result Value Ref Range   Magnesium 1.4 (L) 1.7 - 2.4 mg/dL  Glucose, capillary     Status: Abnormal   Collection Time: 12/18/15  4:39 PM  Result Value Ref Range   Glucose-Capillary 202 (H) 65 - 99 mg/dL   Comment 1 Notify RN    Comment 2 Document in Chart   Troponin I     Status: Abnormal   Collection Time: 12/18/15  4:43 PM  Result Value Ref Range   Troponin I 0.11 (H) <0.031 ng/mL    Comment:        PERSISTENTLY INCREASED TROPONIN VALUES IN THE RANGE OF 0.04-0.49 ng/mL CAN BE SEEN IN:       -UNSTABLE ANGINA       -CONGESTIVE HEART FAILURE       -MYOCARDITIS       -CHEST TRAUMA       -ARRYHTHMIAS       -LATE PRESENTING MYOCARDIAL INFARCTION       -COPD   CLINICAL FOLLOW-UP RECOMMENDED.   Glucose, capillary     Status: Abnormal   Collection Time: 12/18/15  9:31 PM  Result Value Ref Range   Glucose-Capillary 143 (H) 65 - 99 mg/dL  Troponin I     Status: Abnormal   Collection Time: 12/18/15 11:48 PM  Result Value Ref Range   Troponin I 0.15 (H) <0.031 ng/mL    Comment:        PERSISTENTLY INCREASED TROPONIN VALUES IN THE RANGE OF 0.04-0.49 ng/mL CAN BE SEEN IN:       -UNSTABLE ANGINA       -CONGESTIVE HEART FAILURE       -MYOCARDITIS       -CHEST TRAUMA       -ARRYHTHMIAS       -LATE PRESENTING MYOCARDIAL INFARCTION       -COPD   CLINICAL FOLLOW-UP RECOMMENDED.   Basic metabolic panel     Status: Abnormal   Collection Time: 12/19/15  5:09 AM  Result Value Ref Range   Sodium 134 (L) 135 - 145 mmol/L   Potassium 3.0 (L) 3.5 - 5.1 mmol/L    Comment: DELTA CHECK NOTED   Chloride 98 (L) 101 - 111 mmol/L   CO2 25 22 - 32 mmol/L   Glucose, Bld 183 (H) 65 - 99 mg/dL   BUN 23 (H) 6 - 20 mg/dL   Creatinine, Ser 1.05 (H) 0.44 - 1.00 mg/dL   Calcium 8.6 (L) 8.9 - 10.3 mg/dL   GFR calc non Af Amer 50 (L) >60 mL/min    GFR calc Af Amer 58 (L) >60 mL/min    Comment: (NOTE) The eGFR has been calculated using the CKD EPI equation. This calculation has not been validated in all clinical situations. eGFR's persistently <60 mL/min signify possible Chronic Kidney Disease.    Anion gap 11 5 - 15  CBC     Status: Abnormal   Collection Time: 12/19/15  5:09 AM  Result Value Ref Range   WBC 11.6 (H) 4.0 - 10.5 K/uL   RBC 3.80 (L) 3.87 - 5.11 MIL/uL   Hemoglobin 10.1 (L) 12.0 - 15.0 g/dL   HCT 30.8 (L)  36.0 - 46.0 %   MCV 81.1 78.0 - 100.0 fL   MCH 26.6 26.0 - 34.0 pg   MCHC 32.8 30.0 - 36.0 g/dL   RDW 14.6 11.5 - 15.5 %   Platelets 202 150 - 400 K/uL  Glucose, capillary     Status: Abnormal   Collection Time: 12/19/15  7:49 AM  Result Value Ref Range   Glucose-Capillary 183 (H) 65 - 99 mg/dL    ABGS No results for input(s): PHART, PO2ART, TCO2, HCO3 in the last 72 hours.  Invalid input(s): PCO2 CULTURES Recent Results (from the past 240 hour(s))  Culture, blood (routine x 2)     Status: None (Preliminary result)   Collection Time: 12/18/15  8:17 AM  Result Value Ref Range Status   Specimen Description BLOOD LEFT ANTECUBITAL  Final   Special Requests BOTTLES DRAWN AEROBIC AND ANAEROBIC 6CC  Final   Culture NO GROWTH < 24 HOURS  Final   Report Status PENDING  Incomplete  Culture, blood (routine x 2)     Status: None (Preliminary result)   Collection Time: 12/18/15  8:24 AM  Result Value Ref Range Status   Specimen Description BLOOD BLOOD RIGHT HAND  Final   Special Requests BOTTLES DRAWN AEROBIC ONLY 6CC  Final   Culture NO GROWTH < 24 HOURS  Final   Report Status PENDING  Incomplete  MRSA PCR Screening     Status: None   Collection Time: 12/18/15 11:13 AM  Result Value Ref Range Status   MRSA by PCR NEGATIVE NEGATIVE Final    Comment:        The GeneXpert MRSA Assay (FDA approved for NASAL specimens only), is one component of a comprehensive MRSA colonization surveillance program. It is  not intended to diagnose MRSA infection nor to guide or monitor treatment for MRSA infections.    Studies/Results: Dg Chest Portable 1 View  12/18/2015  CLINICAL DATA:  Chest pain EXAM: PORTABLE CHEST 1 VIEW COMPARISON:  None. FINDINGS: Mild focal/patchy opacity in the right upper lobe. Additional patchy opacities in the bilateral lower lobes. Overall, this appearance is considered suspicious for multifocal pneumonia. No definite pleural effusions.  No pneumothorax. Heart is normal in size. IMPRESSION: Mild patchy opacities in the right upper lobe and bilateral lower lobes, suspicious for pneumonia. Electronically Signed   By: Julian Hy M.D.   On: 12/18/2015 08:01    Medications:  Prior to Admission:  Prescriptions prior to admission  Medication Sig Dispense Refill Last Dose  . acetaminophen (TYLENOL) 500 MG tablet Take 1,000 mg by mouth every 6 (six) hours as needed. Pain   unknown  . aspirin EC 81 MG tablet Take 81 mg by mouth daily.   12/16/2015  . Cranberry 500 MG CAPS Take 1 capsule by mouth daily.   12/16/2015  . esomeprazole (NEXIUM) 40 MG capsule Take 40 mg by mouth daily.   12/16/2015  . Olmesartan-Amlodipine-HCTZ (TRIBENZOR) 40-10-25 MG TABS Take 1 tablet by mouth daily.   12/17/2015 at Unknown time  . sitaGLIPtan-metformin (JANUMET) 50-1000 MG per tablet Take 1 tablet by mouth 2 (two) times daily.   12/16/2015  . vitamin B-12 (CYANOCOBALAMIN) 1000 MCG tablet Take 1,000 mcg by mouth daily.   12/16/2015   Scheduled: . aspirin EC  81 mg Oral Daily  . azithromycin  500 mg Intravenous Q24H  . cefTRIAXone (ROCEPHIN)  IV  1 g Intravenous Q24H  . enoxaparin (LOVENOX) injection  80 mg Subcutaneous Q12H  . furosemide  20 mg  Intravenous BID  . guaiFENesin  1,200 mg Oral BID  . insulin aspart  0-15 Units Subcutaneous TID WC  . insulin aspart  0-5 Units Subcutaneous QHS  . metoprolol tartrate  25 mg Oral BID  . pantoprazole  40 mg Oral Daily  . potassium chloride  40 mEq Oral BID   . sodium chloride flush  3 mL Intravenous Q12H  . vitamin B-12  1,000 mcg Oral Daily   Continuous:  [id number] chloride, acetaminophen, levalbuterol, ondansetron (ZOFRAN) IV, sodium chloride flush  Assesment: She was admitted with atrial fibrillation with rapid ventricular response. She has responded to cardioversion. Her heart rates in the 70s now.  She has what appears to be acute congestive heart failure. Her echocardiogram shows reduced systolic function.  She has COPD at baseline and now has what appears to be community-acquired pneumonia as well.  She has diabetes which is doing okay  She has pulmonary hypertension which may be related to her heart failure or to her COPD. Her COPD has not seemed severe prior to this illness  She has hypertension and that's well-controlled  She has multiple electrolyte abnormalities that may have helped precipitate her episode of atrial fibrillation with rapid ventricular response  Troponin level is elevated Active Problems:   Atrial fibrillation with RVR (HCC)   HTN (hypertension)   Diabetes mellitus (HCC)   Acute CHF (congestive heart failure) ([place])   CAP (community acquired pneumonia)    Plan: She will remain in the intensive care unit. She has multiple medical problems. I will replace her potassium. Recheck her magnesium. Cardiology consultation has been requested. Continue with antibiotics for pneumonia. Repeat EKG    LOS: 1 day   Cresencio Reesor L 12/19/2015, 9:31 AM

## 2015-12-20 ENCOUNTER — Encounter (HOSPITAL_COMMUNITY): Payer: Self-pay | Admitting: Adult Health

## 2015-12-20 DIAGNOSIS — I5021 Acute systolic (congestive) heart failure: Secondary | ICD-10-CM

## 2015-12-20 LAB — BASIC METABOLIC PANEL
Anion gap: 7 (ref 5–15)
BUN: 18 mg/dL (ref 6–20)
CALCIUM: 8.5 mg/dL — AB (ref 8.9–10.3)
CHLORIDE: 99 mmol/L — AB (ref 101–111)
CO2: 29 mmol/L (ref 22–32)
CREATININE: 0.87 mg/dL (ref 0.44–1.00)
GFR calc Af Amer: >60 mL/min (ref >60)
GFR calc non Af Amer: >60 mL/min (ref >60)
GLUCOSE: 176 mg/dL — AB (ref 65–99)
Potassium: 3.4 mmol/L — ABNORMAL LOW (ref 3.5–5.1)
Sodium: 135 mmol/L (ref 135–145)

## 2015-12-20 LAB — GLUCOSE, CAPILLARY
GLUCOSE-CAPILLARY: 132 mg/dL — AB (ref 65–99)
GLUCOSE-CAPILLARY: 206 mg/dL — AB (ref 65–99)
Glucose-Capillary: 182 mg/dL — ABNORMAL HIGH (ref 65–99)
Glucose-Capillary: 161 mg/dL — ABNORMAL HIGH (ref 65–99)

## 2015-12-20 LAB — MAGNESIUM: Magnesium: 1.6 mg/dL — ABNORMAL LOW (ref 1.7–2.4)

## 2015-12-20 MED ORDER — CARVEDILOL 6.25 MG PO TABS
6.2500 mg | ORAL_TABLET | Freq: Two times a day (BID) | ORAL | Status: DC
  Administered 2015-12-20 – 2015-12-24 (×9): 6.25 mg via ORAL
  Filled 2015-12-20 (×2): qty 2
  Filled 2015-12-20 (×2): qty 1
  Filled 2015-12-20: qty 2
  Filled 2015-12-20: qty 1
  Filled 2015-12-20 (×3): qty 2

## 2015-12-20 MED ORDER — POTASSIUM CHLORIDE CRYS ER 20 MEQ PO TBCR
40.0000 meq | EXTENDED_RELEASE_TABLET | Freq: Three times a day (TID) | ORAL | Status: DC
  Administered 2015-12-20 – 2015-12-24 (×12): 40 meq via ORAL
  Filled 2015-12-20 (×14): qty 2

## 2015-12-20 MED ORDER — OFF THE BEAT BOOK
Freq: Once | Status: AC
  Administered 2015-12-21: 1
  Filled 2015-12-20: qty 1

## 2015-12-20 MED ORDER — MAGNESIUM SULFATE 2 GM/50ML IV SOLN
2.0000 g | Freq: Once | INTRAVENOUS | Status: AC
  Administered 2015-12-20: 2 g via INTRAVENOUS
  Filled 2015-12-20: qty 50

## 2015-12-20 NOTE — Clinical Documentation Improvement (Signed)
Cardiology Internal Medicine Pulmonology  Can the diagnosis of "Acute CHF" be further specified? Please document response in next progress note. Thank you!    Type - Systolic, Diastolic, Systolic and Diastolic  Other  Clinically Undetermined  Document any associated diagnoses/conditions  Supporting Information:  Systolic Dysfunction: Echo reveals EF of 30-35% with no prior echo to compare this to. Will change from metoprolol to coreg 6.25 mg BID. BP has been labile. Consider adding ACE-I or ARB later. Once she is recovered from pneumonia, will consider transfer to [facility] for cardiac cath.  Being treated with IV Lasix 20 mg 2 times daily  Please exercise your independent, professional judgment when responding. A specific answer is not anticipated or expected.  Thank You,  Shellee Milo RN, BSN, CCDS Health Information Management [place] [phone]; Cell: [phone]

## 2015-12-20 NOTE — Consult Note (Signed)
CARDIOLOGY CONSULT NOTE   Patient ID: Leslie Kim MRN: [id number] DOB/AGE: 09-03-1935 80 y.o.  Admit Date: 12/18/2015 Requesting Physician: Kari Baars MD Primary Physician: Fredirick Maudlin, MD Consulting Cardiologist: Nona Dell MD Reason for Consultation: New Onset Atrial fib with RVR  Clinical Summary Leslie Kim is a 80 y.o.female with history of hypertension, COPD, and diabetes mellitus who presented to ER with complaints of progressive dyspnea and fatigue with exertion. She states that symptoms began approximately Wednesday of last week with DOE. This worsened to PND and orthopnea over the next 3 days but she waited to seek medical treatment until Saturday. She states that she actually began to have worsening fatigue about 1 year ago with decreased energy but felt it was related to her age. She denies having any recent chest tightness or palpitations. No fevers or chills, no recent progressive cough.  On arrival to ER, BP elevated, HR 175, O2 Sat 94%, R 32. CXR revealed suspected multifocal pneumonia and she was started on IV Rocephin. She was found to be in atrial fibrillation with RVR on initial EKG. The patient was unaware of rapid HR.  She was started on IV diltiazem gtt after 10 mg bolus, and started at 5 mg hour. She  became hypotensive with BP 74/57. As a result of this diltiazem was stopped and DCCV was completed by ER physician with shock of 120 J, followed by 150 J which was successful in converting her to NSR. Repeat EKG demonstrated NSR with LBBB.    Na 129, Creatinine 1.28, Glucose 282. Troponin 0.05, BNP 661. WBC 13.4. Hgb 10.9, Hct 33.3 Subsequent troponin 0.06, 0.11, 0.15 respectively. She has been placed on Lasix 20 mg IV BID, metoprolol, and ASA. She is coughing some this morning, mildly productive but breathing status is improved overall. Able to sleep without PND or orthopnea.   Allergies  Allergen Reactions  . Codeine Other (See Comments)    Too strong  for patient      Medications Scheduled Medications: . aspirin EC  81 mg Oral Daily  . azithromycin  500 mg Intravenous Q24H  . carvedilol  6.25 mg Oral BID WC  . cefTRIAXone (ROCEPHIN)  IV  1 g Intravenous Q24H  . enoxaparin (LOVENOX) injection  80 mg Subcutaneous Q12H  . furosemide  20 mg Intravenous BID  . guaiFENesin  1,200 mg Oral BID  . insulin aspart  0-15 Units Subcutaneous TID WC  . insulin aspart  0-5 Units Subcutaneous QHS  . magnesium sulfate 1 - 4 g bolus IVPB  2 g Intravenous Once  . pantoprazole  40 mg Oral Daily  . potassium chloride  40 mEq Oral TID  . sodium chloride flush  3 mL Intravenous Q12H  . vitamin B-12  1,000 mcg Oral Daily    PRN Medications: sodium chloride, acetaminophen, levalbuterol, ondansetron (ZOFRAN) IV, sodium chloride flush   Past Medical History  Diagnosis Date  . Type 2 diabetes mellitus (HCC)   . Essential hypertension     Past Surgical History  Procedure Laterality Date  . Cholecystectomy      Family History  Problem Relation Age of Onset  . Diabetes Mother   . Diabetes Father   . Diabetes Brother   . Diabetes Brother   . Diabetes Brother   . Diabetes Brother   . Diabetes Brother   . Cancer Father    Social History Leslie Kim reports that she quit smoking about 80 years ago. Her smoking use included Cigarettes.  She does not have any smokeless tobacco history on file. Leslie Kim reports that she does not drink alcohol.  Review of Systems Complete review of systems are found to be negative unless outlined in H&P above. Exertional fatigue over several months to a year. No syncope. No leg edema.  Physical Examination Blood pressure 157/73, pulse 90, temperature 97 F (36.1 C), temperature source Oral, resp. rate 27, height  (1.626 m), weight 173 lb 15.1 oz (78.9 kg), SpO2 93 %.  Intake/Output Summary (Last 24 hours) at 12/20/15 0918 Last data filed at 12/20/15 0600  Gross per 24 hour  Intake   1380 ml  Output   1650  ml  Net   -270 ml    Telemetry: NSR with LBBB rates in the 90's.   GEN: Calm, speaking in full senses after coughing spell this morning.  HEENT: Conjunctiva and lids normal, oropharynx clear. Neck: Supple, mildlyelevated JVP, no carotid bruits, no thyromegaly. Lungs: Few scattered crackles, no wheezing, diminished slightly in the bases.  Cardiac: Regular rate and rhythm, no S3 or significant systolic murmur, no pericardial rub. Abdomen: Soft, nontender, no hepatomegaly, bowel sounds present, no guarding or rebound. Extremities: No pitting edema, distal pulses 2+. Skin: Warm and dry. Musculoskeletal: No kyphosis. Neuropsychiatric: Alert and oriented x3, affect grossly appropriate.  Prior Cardiac Testing/Procedures 1.Echocardiogram:12/18/2015 Left ventricle: The cavity size was normal. Systolic function was  moderately to severely reduced. The estimated ejection fraction  was in the range of 30% to 35%. Dyskinesis of the anteroseptal,  anterior, and apical myocardium. - Aortic valve: Valve area (VTI): 2.03 cm^2. Valve area (Vmax):  2.04 cm^2. Valve area (Vmean): 1.9 cm^2. - Mitral valve: Moderately to severely calcified annulus. Mildly  calcified leaflets . There was mild to moderate regurgitation.  Valve area by continuity equation (using LVOT flow): 1.11 cm^2. - Left atrium: The atrium was mildly dilated. - Pulmonary arteries: Systolic pressure was increased. PA peak  pressure: 40 mm Hg (S).  Lab Results  Basic Metabolic Panel:  Recent Labs Lab 12/18/15 0650 12/18/15 1133 12/19/15 0509 12/20/15 0441  NA 129*  --  134* 135  K 3.7  --  3.0* 3.4*  CL 94*  --  98* 99*  CO2 21*  --  25 29  GLUCOSE 282*  --  183* 176*  BUN 29*  --  23* 18  CREATININE 1.28*  --  1.05* 0.87  CALCIUM 9.2  --  8.6* 8.5*  MG  --  1.4* 2.0 1.6*   CBC:  Recent Labs Lab 12/18/15 0650 12/19/15 0509  WBC 13.4* 11.6*  NEUTROABS 10.8*  --   HGB 10.9* 10.1*  HCT 33.3* 30.8*  MCV 80.6  81.1  PLT 242 202   Cardiac Enzymes:  Recent Labs Lab 12/18/15 0650 12/18/15 1133 12/18/15 1643 12/18/15 2348  TROPONINI 0.05* 0.06* 0.11* 0.15*   Radiology: FINDINGS:CXR Mild focal/patchy opacity in the right upper lobe. Additional patchy opacities in the bilateral lower lobes. Overall, this appearance is considered suspicious for multifocal pneumonia.  No definite pleural effusions. No pneumothorax.  Heart is normal in size.  IMPRESSION: Mild patchy opacities in the right upper lobe and bilateral lower lobes, suspicious for pneumonia.  ECG: NSR with LBBB. Rate of 82 bpm.   Impression and Recommendations  1. Atrial fibrillation with RVR: Now converted to NSR with LBBB after DCCV in ER. She is on metoprolol 25 mg BID and ASA. CHADSVASC Score of 6. Mildly elevated troponin, likely related to demand ischemia due  to HR, but with associated cardiomyopathy and wall motion abnormalities, underlying ischemic heart disease is also suspected.  2. Systolic Dysfunction: Echo reveals EF of 30-35% with no prior echo to compare this to. Will change from metoprolol to coreg 6.25 mg BID. BP has been labile. Consider adding ACE-I or ARB later. Once she is recovered from pneumonia, will consider transfer to [facility] for cardiac cath. Check lipids.   3. Pneumonia: Per PCP  4. Diabetes: Recommend Hgb A1C.   5. Hypertension: Labile. At home on  olmesartan-amlodipine-HCTZ (Tribenzor).   6. GERD: On PPI. Magnesium 1.6. Replete daily  Signed: Bettey Mare. Lawrence NP AACC  12/20/2015, 9:18 AM Co-Sign MD  Attending note:  Patient seen and examined. Reviewed records and updated her chart. Discussed the case with Ms. Lawrence NP and modified above note. Ms. Nghiem presents with a one-week history of progressive dyspnea on exertion and fatigue culminating in orthopnea. She does not report any recent fevers or chills, no cough until just recently. She states that over the last year she has had more  prominent dyspnea on exertion with ADLs, but no exertional chest tightness or palpitations. She is now admitted to the hospital with suspected pneumonia, also had transient atrial fibrillation with RVR at presentation - was treated with IV diltiazem initially, although ultimately underwent DCCV in the ER due to hypotension. CHADSVASC score is 6.  On examination this morning she stated that she was breathing more easily. Did have a coughing spell this morning. She has been afebrile, recent heart rate in the 90s and sinus rhythm, systolic blood pressure in the 150s. Lungs exhibit a few scattered crackles, no wheezing. Mildly elevated JVP noted. Cardiac exam with RRR but no S3. No peripheral edema. Lab work shows creatinine 0.8, hemoglobin 10.1, peak troponin I 0.15, BNP 661. Echocardiogram reported LVEF 30-35% with dyskinesis of the anteroseptal and apical myocardium. ECG in sinus rhythm shows left bundle branch block. Chest x-ray reveals mild patchy airspace opacities in the right upper lobe and both lower lobes.  Atrial fibrillation with RVR, now maintaining sinus rhythm after electrical cardioversion in the ER. We are transitioning from metoprolol to Coreg in the face of cardiomyopathy with LVEF 30-35%, also newly diagnosed. She is being treated for possible pneumonia, although acute systolic heart failure also is part of this picture. Whether her cardiomyopathy is truly new or not is hard to say, she reports dyspnea and exertion and fatigue over the last year. Cardiac enzymes are increased with troponin I of 0.15, possibly demand ischemia in the face of RVR, but underlying CAD is certainly suspected. Plan is to continue aspirin for now, transition from metoprolol to Coreg, consider adding ARB next depending on blood pressure, check lipid panel. Ultimately can further discuss oral anticoagulation after invasive testing complete. Cardiac catheterization will be considered for further evaluation of cardiac status  as pulmonary status improves. Will tentatively consider transfer to [facility] in the next few days.  Jonelle Sidle, M.D., F.A.C.C.

## 2015-12-20 NOTE — Progress Notes (Signed)
Subjective: She says she feels better. She had another EKG yesterday that showed left bundle branch block which was not present on her original EKG. Her echocardiogram showed significant abnormalities. She's not short of breath. She is coughing a little bit.  Objective: Vital signs in last 24 hours: Temp:  [97 F (36.1 C)-98 F (36.7 C)] 97 F (36.1 C) (05/22 0400) Pulse Rate:  [64-94] 90 (05/22 0630) Resp:  [17-30] 27 (05/22 0630) BP: (84-157)/(47-117) 157/73 mmHg (05/22 0600) SpO2:  [88 %-98 %] 93 % (05/22 0630) Weight:  [78.9 kg (173 lb 15.1 oz)] 78.9 kg (173 lb 15.1 oz) (05/22 0500) Weight change: -0.5 kg (-1 lb 1.6 oz) Last BM Date: 12/17/15  Intake/Output from previous day: 05/21 0701 - 05/22 0700 In: 1430 [P.O.:840; I.V.:290; IV Piggyback:300] Out: 1650 [Urine:1650]  PHYSICAL EXAM General appearance: alert, cooperative and no distress Resp: clear to auscultation bilaterally Cardio: regular rate and rhythm, S1, S2 normal, no murmur, click, rub or gallop GI: soft, non-tender; bowel sounds normal; no masses,  no organomegaly Extremities: extremities normal, atraumatic, no cyanosis or edema  Lab Results:  Results for orders placed or performed during the hospital encounter of 12/18/15 (from the past 48 hour(s))  MRSA PCR Screening     Status: None   Collection Time: 12/18/15 11:13 AM  Result Value Ref Range   MRSA by PCR NEGATIVE NEGATIVE    Comment:        The GeneXpert MRSA Assay (FDA approved for NASAL specimens only), is one component of a comprehensive MRSA colonization surveillance program. It is not intended to diagnose MRSA infection nor to guide or monitor treatment for MRSA infections.   Glucose, capillary     Status: Abnormal   Collection Time: 12/18/15 11:21 AM  Result Value Ref Range   Glucose-Capillary 291 (H) 65 - 99 mg/dL   Comment 1 Notify RN    Comment 2 Document in Chart   TSH     Status: None   Collection Time: 12/18/15 11:33 AM  Result  Value Ref Range   TSH 3.456 0.350 - 4.500 uIU/mL  Troponin I     Status: Abnormal   Collection Time: 12/18/15 11:33 AM  Result Value Ref Range   Troponin I 0.06 (H) <0.031 ng/mL    Comment:        PERSISTENTLY INCREASED TROPONIN VALUES IN THE RANGE OF 0.04-0.49 ng/mL CAN BE SEEN IN:       -UNSTABLE ANGINA       -CONGESTIVE HEART FAILURE       -MYOCARDITIS       -CHEST TRAUMA       -ARRYHTHMIAS       -LATE PRESENTING MYOCARDIAL INFARCTION       -COPD   CLINICAL FOLLOW-UP RECOMMENDED.   Magnesium     Status: Abnormal   Collection Time: 12/18/15 11:33 AM  Result Value Ref Range   Magnesium 1.4 (L) 1.7 - 2.4 mg/dL  Glucose, capillary     Status: Abnormal   Collection Time: 12/18/15  4:39 PM  Result Value Ref Range   Glucose-Capillary 202 (H) 65 - 99 mg/dL   Comment 1 Notify RN    Comment 2 Document in Chart   Troponin I     Status: Abnormal   Collection Time: 12/18/15  4:43 PM  Result Value Ref Range   Troponin I 0.11 (H) <0.031 ng/mL    Comment:        PERSISTENTLY INCREASED TROPONIN VALUES IN THE RANGE OF  0.04-0.49 ng/mL CAN BE SEEN IN:       -UNSTABLE ANGINA       -CONGESTIVE HEART FAILURE       -MYOCARDITIS       -CHEST TRAUMA       -ARRYHTHMIAS       -LATE PRESENTING MYOCARDIAL INFARCTION       -COPD   CLINICAL FOLLOW-UP RECOMMENDED.   Glucose, capillary     Status: Abnormal   Collection Time: 12/18/15  9:31 PM  Result Value Ref Range   Glucose-Capillary 143 (H) 65 - 99 mg/dL  Troponin I     Status: Abnormal   Collection Time: 12/18/15 11:48 PM  Result Value Ref Range   Troponin I 0.15 (H) <0.031 ng/mL    Comment:        PERSISTENTLY INCREASED TROPONIN VALUES IN THE RANGE OF 0.04-0.49 ng/mL CAN BE SEEN IN:       -UNSTABLE ANGINA       -CONGESTIVE HEART FAILURE       -MYOCARDITIS       -CHEST TRAUMA       -ARRYHTHMIAS       -LATE PRESENTING MYOCARDIAL INFARCTION       -COPD   CLINICAL FOLLOW-UP RECOMMENDED.   Basic metabolic panel     Status:  Abnormal   Collection Time: 12/19/15  5:09 AM  Result Value Ref Range   Sodium 134 (L) 135 - 145 mmol/L   Potassium 3.0 (L) 3.5 - 5.1 mmol/L    Comment: DELTA CHECK NOTED   Chloride 98 (L) 101 - 111 mmol/L   CO2 25 22 - 32 mmol/L   Glucose, Bld 183 (H) 65 - 99 mg/dL   BUN 23 (H) 6 - 20 mg/dL   Creatinine, Ser 1.05 (H) 0.44 - 1.00 mg/dL   Calcium 8.6 (L) 8.9 - 10.3 mg/dL   GFR calc non Af Amer 50 (L) >60 mL/min   GFR calc Af Amer 58 (L) >60 mL/min    Comment: (NOTE) The eGFR has been calculated using the CKD EPI equation. This calculation has not been validated in all clinical situations. eGFR's persistently <60 mL/min signify possible Chronic Kidney Disease.    Anion gap 11 5 - 15  CBC     Status: Abnormal   Collection Time: 12/19/15  5:09 AM  Result Value Ref Range   WBC 11.6 (H) 4.0 - 10.5 K/uL   RBC 3.80 (L) 3.87 - 5.11 MIL/uL   Hemoglobin 10.1 (L) 12.0 - 15.0 g/dL   HCT 30.8 (L) 36.0 - 46.0 %   MCV 81.1 78.0 - 100.0 fL   MCH 26.6 26.0 - 34.0 pg   MCHC 32.8 30.0 - 36.0 g/dL   RDW 14.6 11.5 - 15.5 %   Platelets 202 150 - 400 K/uL  Magnesium     Status: None   Collection Time: 12/19/15  5:09 AM  Result Value Ref Range   Magnesium 2.0 1.7 - 2.4 mg/dL  Glucose, capillary     Status: Abnormal   Collection Time: 12/19/15  7:49 AM  Result Value Ref Range   Glucose-Capillary 183 (H) 65 - 99 mg/dL  Glucose, capillary     Status: Abnormal   Collection Time: 12/19/15 11:46 AM  Result Value Ref Range   Glucose-Capillary 238 (H) 65 - 99 mg/dL   Comment 1 Notify RN    Comment 2 Document in Chart   Glucose, capillary     Status: Abnormal   Collection Time: 12/19/15  4:26 PM  Result Value Ref Range   Glucose-Capillary 149 (H) 65 - 99 mg/dL   Comment 1 Notify RN    Comment 2 Document in Chart   Glucose, capillary     Status: Abnormal   Collection Time: 12/19/15  9:52 PM  Result Value Ref Range   Glucose-Capillary 169 (H) 65 - 99 mg/dL  Basic metabolic panel     Status:  Abnormal   Collection Time: 12/20/15  4:41 AM  Result Value Ref Range   Sodium 135 135 - 145 mmol/L   Potassium 3.4 (L) 3.5 - 5.1 mmol/L   Chloride 99 (L) 101 - 111 mmol/L   CO2 29 22 - 32 mmol/L   Glucose, Bld 176 (H) 65 - 99 mg/dL   BUN 18 6 - 20 mg/dL   Creatinine, Ser 0.87 0.44 - 1.00 mg/dL   Calcium 8.5 (L) 8.9 - 10.3 mg/dL   GFR calc non Af Amer >60 >60 mL/min   GFR calc Af Amer >60 >60 mL/min    Comment: (NOTE) The eGFR has been calculated using the CKD EPI equation. This calculation has not been validated in all clinical situations. eGFR's persistently <60 mL/min signify possible Chronic Kidney Disease.    Anion gap 7 5 - 15  Magnesium     Status: Abnormal   Collection Time: 12/20/15  4:41 AM  Result Value Ref Range   Magnesium 1.6 (L) 1.7 - 2.4 mg/dL  Glucose, capillary     Status: Abnormal   Collection Time: 12/20/15  7:38 AM  Result Value Ref Range   Glucose-Capillary 182 (H) 65 - 99 mg/dL   Comment 1 Notify RN    Comment 2 Document in Chart     ABGS No results for input(s): PHART, PO2ART, TCO2, HCO3 in the last 72 hours.  Invalid input(s): PCO2 CULTURES Recent Results (from the past 240 hour(s))  Culture, blood (routine x 2)     Status: None (Preliminary result)   Collection Time: 12/18/15  8:17 AM  Result Value Ref Range Status   Specimen Description BLOOD LEFT ANTECUBITAL  Final   Special Requests BOTTLES DRAWN AEROBIC AND ANAEROBIC 6CC  Final   Culture NO GROWTH < 24 HOURS  Final   Report Status PENDING  Incomplete  Culture, blood (routine x 2)     Status: None (Preliminary result)   Collection Time: 12/18/15  8:24 AM  Result Value Ref Range Status   Specimen Description BLOOD BLOOD RIGHT HAND  Final   Special Requests BOTTLES DRAWN AEROBIC ONLY 6CC  Final   Culture NO GROWTH < 24 HOURS  Final   Report Status PENDING  Incomplete  MRSA PCR Screening     Status: None   Collection Time: 12/18/15 11:13 AM  Result Value Ref Range Status   MRSA by PCR  NEGATIVE NEGATIVE Final    Comment:        The GeneXpert MRSA Assay (FDA approved for NASAL specimens only), is one component of a comprehensive MRSA colonization surveillance program. It is not intended to diagnose MRSA infection nor to guide or monitor treatment for MRSA infections.    Studies/Results: No results found.  Medications:  Prior to Admission:  Prescriptions prior to admission  Medication Sig Dispense Refill Last Dose  . acetaminophen (TYLENOL) 500 MG tablet Take 1,000 mg by mouth every 6 (six) hours as needed. Pain   unknown  . aspirin EC 81 MG tablet Take 81 mg by mouth daily.   12/16/2015  .  Cranberry 500 MG CAPS Take 1 capsule by mouth daily.   12/16/2015  . esomeprazole (NEXIUM) 40 MG capsule Take 40 mg by mouth daily.   12/16/2015  . Olmesartan-Amlodipine-HCTZ (TRIBENZOR) 40-10-25 MG TABS Take 1 tablet by mouth daily.   12/17/2015 at Unknown time  . sitaGLIPtan-metformin (JANUMET) 50-1000 MG per tablet Take 1 tablet by mouth 2 (two) times daily.   12/16/2015  . vitamin B-12 (CYANOCOBALAMIN) 1000 MCG tablet Take 1,000 mcg by mouth daily.   12/16/2015   Scheduled: . aspirin EC  81 mg Oral Daily  . azithromycin  500 mg Intravenous Q24H  . cefTRIAXone (ROCEPHIN)  IV  1 g Intravenous Q24H  . enoxaparin (LOVENOX) injection  80 mg Subcutaneous Q12H  . furosemide  20 mg Intravenous BID  . guaiFENesin  1,200 mg Oral BID  . insulin aspart  0-15 Units Subcutaneous TID WC  . insulin aspart  0-5 Units Subcutaneous QHS  . magnesium sulfate 1 - 4 g bolus IVPB  2 g Intravenous Once  . metoprolol tartrate  25 mg Oral BID  . pantoprazole  40 mg Oral Daily  . potassium chloride  40 mEq Oral TID  . sodium chloride flush  3 mL Intravenous Q12H  . vitamin B-12  1,000 mcg Oral Daily   Continuous:  FBX:UXYBFX chloride, acetaminophen, levalbuterol, ondansetron (ZOFRAN) IV, sodium chloride flush  Assesment: She was admitted with atrial fibrillation with rapid ventricular response  and required cardioversion in the emergency department because she did not respond to medical therapy. She has what appears to be acute congestive heart failure and her cardiac ejection fraction was in the 30s on echocardiogram yesterday. She has community-acquired pneumonia which is improving she has hypertension which is stable and free well controlled on current medications. She has diabetes which is doing okay. She had hypokalemia and low magnesium which are being replaced Active Problems:   Atrial fibrillation with RVR (HCC)   HTN (hypertension)   Diabetes mellitus ([place])   Acute CHF (congestive heart failure) ([place])   CAP (community acquired pneumonia)    Plan: Continue treatments. Cardiology consultation. She may be able to be transferred out of stepdown depending on results.    LOS: 2 days   Taelyr Jantz L 12/20/2015, 8:40 AM

## 2015-12-21 ENCOUNTER — Inpatient Hospital Stay (HOSPITAL_COMMUNITY): Payer: Medicare Other

## 2015-12-21 DIAGNOSIS — I480 Paroxysmal atrial fibrillation: Secondary | ICD-10-CM

## 2015-12-21 DIAGNOSIS — I5023 Acute on chronic systolic (congestive) heart failure: Secondary | ICD-10-CM | POA: Diagnosis present

## 2015-12-21 LAB — GLUCOSE, CAPILLARY
GLUCOSE-CAPILLARY: 228 mg/dL — AB (ref 65–99)
GLUCOSE-CAPILLARY: 135 mg/dL — AB (ref 65–99)
Glucose-Capillary: 173 mg/dL — ABNORMAL HIGH (ref 65–99)
Glucose-Capillary: 176 mg/dL — ABNORMAL HIGH (ref 65–99)

## 2015-12-21 LAB — BASIC METABOLIC PANEL
ANION GAP: 7 (ref 5–15)
BUN: 20 mg/dL (ref 6–20)
CALCIUM: 8.8 mg/dL — AB (ref 8.9–10.3)
CO2: 29 mmol/L (ref 22–32)
CREATININE: 0.89 mg/dL (ref 0.44–1.00)
Chloride: 101 mmol/L (ref 101–111)
GLUCOSE: 163 mg/dL — AB (ref 65–99)
Potassium: 4.4 mmol/L (ref 3.5–5.1)
Sodium: 137 mmol/L (ref 135–145)

## 2015-12-21 LAB — LIPID PANEL
CHOLESTEROL: 165 mg/dL (ref 0–200)
HDL: 49 mg/dL (ref >40)
LDL Cholesterol: 97 mg/dL (ref 0–99)
TRIGLYCERIDES: 97 mg/dL (ref <150)
Total CHOL/HDL Ratio: 3.4 RATIO
VLDL: 19 mg/dL (ref 0–40)

## 2015-12-21 LAB — HEMOGLOBIN A1C
HEMOGLOBIN A1C: 7.4 % — AB (ref 4.8–5.6)
MEAN PLASMA GLUCOSE: 166 mg/dL

## 2015-12-21 MED ORDER — FUROSEMIDE 10 MG/ML IJ SOLN
40.0000 mg | Freq: Two times a day (BID) | INTRAMUSCULAR | Status: DC
  Administered 2015-12-21 – 2015-12-22 (×3): 40 mg via INTRAVENOUS
  Filled 2015-12-21 (×4): qty 4

## 2015-12-21 MED ORDER — POLYETHYLENE GLYCOL 3350 17 G PO PACK
17.0000 g | PACK | Freq: Every day | ORAL | Status: DC
  Administered 2015-12-21: 17 g via ORAL
  Filled 2015-12-21 (×3): qty 1

## 2015-12-21 NOTE — Progress Notes (Signed)
Subjective: Cardiology consult noted and appreciated. I think she probably does have acute on chronic systolic heart failure based on her history. She still has some congestion in her chest. She is receiving IV Lasix. However she is positive a liter or so since admission. She has no chest pain.  Objective: Vital signs in last 24 hours: Temp:  [97 F (36.1 C)-98 F (36.7 C)] 97.7 F (36.5 C) (05/23 0400) Pulse Rate:  [65-88] 87 (05/23 0700) Resp:  [15-30] 30 (05/23 0700) BP: (99-151)/(54-89) 144/81 mmHg (05/23 0700) SpO2:  [94 %-100 %] 97 % (05/23 0700) FiO2 (%):  [32 %] 32 % (05/23 0757) Weight:  [78.6 kg (173 lb 4.5 oz)] 78.6 kg (173 lb 4.5 oz) (05/23 0500) Weight change: -0.3 kg (-10.6 oz) Last BM Date: 12/17/15  Intake/Output from previous day: 05/22 0701 - 05/23 0700 In: 1820 [P.O.:1280; I.V.:240; IV Piggyback:300] Out: -   PHYSICAL EXAM General appearance: alert, cooperative and mild distress Resp: rhonchi bilaterally Cardio: regular rate and rhythm, S1, S2 normal, no murmur, click, rub or gallop GI: soft, non-tender; bowel sounds normal; no masses,  no organomegaly Extremities: extremities normal, atraumatic, no cyanosis or edema  Lab Results:  Results for orders placed or performed during the hospital encounter of 12/18/15 (from the past 48 hour(s))  Glucose, capillary     Status: Abnormal   Collection Time: 12/19/15 11:46 AM  Result Value Ref Range   Glucose-Capillary 238 (H) 65 - 99 mg/dL   Comment 1 Notify RN    Comment 2 Document in Chart   Glucose, capillary     Status: Abnormal   Collection Time: 12/19/15  4:26 PM  Result Value Ref Range   Glucose-Capillary 149 (H) 65 - 99 mg/dL   Comment 1 Notify RN    Comment 2 Document in Chart   Glucose, capillary     Status: Abnormal   Collection Time: 12/19/15  9:52 PM  Result Value Ref Range   Glucose-Capillary 169 (H) 65 - 99 mg/dL  Basic metabolic panel     Status: Abnormal   Collection Time: 12/20/15  4:41 AM   Result Value Ref Range   Sodium 135 135 - 145 mmol/L   Potassium 3.4 (L) 3.5 - 5.1 mmol/L   Chloride 99 (L) 101 - 111 mmol/L   CO2 29 22 - 32 mmol/L   Glucose, Bld 176 (H) 65 - 99 mg/dL   BUN 18 6 - 20 mg/dL   Creatinine, Ser 0.87 0.44 - 1.00 mg/dL   Calcium 8.5 (L) 8.9 - 10.3 mg/dL   GFR calc non Af Amer >60 >60 mL/min   GFR calc Af Amer >60 >60 mL/min    Comment: (NOTE) The eGFR has been calculated using the CKD EPI equation. This calculation has not been validated in all clinical situations. eGFR's persistently <60 mL/min signify possible Chronic Kidney Disease.    Anion gap 7 5 - 15  Magnesium     Status: Abnormal   Collection Time: 12/20/15  4:41 AM  Result Value Ref Range   Magnesium 1.6 (L) 1.7 - 2.4 mg/dL  Glucose, capillary     Status: Abnormal   Collection Time: 12/20/15  7:38 AM  Result Value Ref Range   Glucose-Capillary 182 (H) 65 - 99 mg/dL   Comment 1 Notify RN    Comment 2 Document in Chart   Glucose, capillary     Status: Abnormal   Collection Time: 12/20/15 11:39 AM  Result Value Ref Range   Glucose-Capillary  161 (H) 65 - 99 mg/dL   Comment 1 Notify RN    Comment 2 Document in Chart   Glucose, capillary     Status: Abnormal   Collection Time: 12/20/15  4:35 PM  Result Value Ref Range   Glucose-Capillary 132 (H) 65 - 99 mg/dL  Glucose, capillary     Status: Abnormal   Collection Time: 12/20/15  9:34 PM  Result Value Ref Range   Glucose-Capillary 206 (H) 65 - 99 mg/dL   Comment 1 Notify RN   Basic metabolic panel     Status: Abnormal   Collection Time: 12/21/15  4:58 AM  Result Value Ref Range   Sodium 137 135 - 145 mmol/L   Potassium 4.4 3.5 - 5.1 mmol/L    Comment: DELTA CHECK NOTED   Chloride 101 101 - 111 mmol/L   CO2 29 22 - 32 mmol/L   Glucose, Bld 163 (H) 65 - 99 mg/dL   BUN 20 6 - 20 mg/dL   Creatinine, Ser 0.89 0.44 - 1.00 mg/dL   Calcium 8.8 (L) 8.9 - 10.3 mg/dL   GFR calc non Af Amer >60 >60 mL/min   GFR calc Af Amer >60 >60 mL/min     Comment: (NOTE) The eGFR has been calculated using the CKD EPI equation. This calculation has not been validated in all clinical situations. eGFR's persistently <60 mL/min signify possible Chronic Kidney Disease.    Anion gap 7 5 - 15  Lipid panel     Status: None   Collection Time: 12/21/15  4:58 AM  Result Value Ref Range   Cholesterol 165 0 - 200 mg/dL   Triglycerides 97 <150 mg/dL   HDL 49 >40 mg/dL   Total CHOL/HDL Ratio 3.4 RATIO   VLDL 19 0 - 40 mg/dL   LDL Cholesterol 97 0 - 99 mg/dL    Comment:        Total Cholesterol/HDL:CHD Risk Coronary Heart Disease Risk Table                     Men   Women  1/2 Average Risk   3.4   3.3  Average Risk       5.0   4.4  2 X Average Risk   9.6   7.1  3 X Average Risk  23.4   11.0        Use the calculated Patient Ratio above and the CHD Risk Table to determine the patient's CHD Risk.        ATP III CLASSIFICATION (LDL):  <100     mg/dL   Optimal  100-129  mg/dL   Near or Above                    Optimal  130-159  mg/dL   Borderline  160-189  mg/dL   High  >190     mg/dL   Very High   Glucose, capillary     Status: Abnormal   Collection Time: 12/21/15  7:23 AM  Result Value Ref Range   Glucose-Capillary 173 (H) 65 - 99 mg/dL   Comment 1 Notify RN    Comment 2 Document in Chart     ABGS No results for input(s): PHART, PO2ART, TCO2, HCO3 in the last 72 hours.  Invalid input(s): PCO2 CULTURES Recent Results (from the past 240 hour(s))  Culture, blood (routine x 2)     Status: None (Preliminary result)   Collection Time: 12/18/15  8:17  AM  Result Value Ref Range Status   Specimen Description BLOOD LEFT ANTECUBITAL  Final   Special Requests BOTTLES DRAWN AEROBIC AND ANAEROBIC 6CC  Final   Culture NO GROWTH 2 DAYS  Final   Report Status PENDING  Incomplete  Culture, blood (routine x 2)     Status: None (Preliminary result)   Collection Time: 12/18/15  8:24 AM  Result Value Ref Range Status   Specimen Description  BLOOD BLOOD RIGHT HAND  Final   Special Requests BOTTLES DRAWN AEROBIC ONLY 6CC  Final   Culture NO GROWTH 2 DAYS  Final   Report Status PENDING  Incomplete  MRSA PCR Screening     Status: None   Collection Time: 12/18/15 11:13 AM  Result Value Ref Range Status   MRSA by PCR NEGATIVE NEGATIVE Final    Comment:        The GeneXpert MRSA Assay (FDA approved for NASAL specimens only), is one component of a comprehensive MRSA colonization surveillance program. It is not intended to diagnose MRSA infection nor to guide or monitor treatment for MRSA infections.    Studies/Results: No results found.  Medications:  Prior to Admission:  Prescriptions prior to admission  Medication Sig Dispense Refill Last Dose  . acetaminophen (TYLENOL) 500 MG tablet Take 1,000 mg by mouth every 6 (six) hours as needed. Pain   unknown  . aspirin EC 81 MG tablet Take 81 mg by mouth daily.   12/16/2015  . Cranberry 500 MG CAPS Take 1 capsule by mouth daily.   12/16/2015  . esomeprazole (NEXIUM) 40 MG capsule Take 40 mg by mouth daily.   12/16/2015  . Olmesartan-Amlodipine-HCTZ (TRIBENZOR) 40-10-25 MG TABS Take 1 tablet by mouth daily.   12/17/2015 at Unknown time  . sitaGLIPtan-metformin (JANUMET) 50-1000 MG per tablet Take 1 tablet by mouth 2 (two) times daily.   12/16/2015  . vitamin B-12 (CYANOCOBALAMIN) 1000 MCG tablet Take 1,000 mcg by mouth daily.   12/16/2015   Scheduled: . aspirin EC  81 mg Oral Daily  . azithromycin  500 mg Intravenous Q24H  . carvedilol  6.25 mg Oral BID WC  . cefTRIAXone (ROCEPHIN)  IV  1 g Intravenous Q24H  . enoxaparin (LOVENOX) injection  80 mg Subcutaneous Q12H  . furosemide  20 mg Intravenous BID  . guaiFENesin  1,200 mg Oral BID  . insulin aspart  0-15 Units Subcutaneous TID WC  . insulin aspart  0-5 Units Subcutaneous QHS  . off the beat book   Does not apply Once  . pantoprazole  40 mg Oral Daily  . polyethylene glycol  17 g Oral Daily  . potassium chloride  40 mEq  Oral TID  . sodium chloride flush  3 mL Intravenous Q12H  . vitamin B-12  1,000 mcg Oral Daily   Continuous:  EYC:XKGYJE chloride, acetaminophen, levalbuterol, ondansetron (ZOFRAN) IV, sodium chloride flush  Assesment: She was admitted with atrial fibrillation with rapid ventricular response. She was cardioverted in the emergency department because she did not respond to medical treatment. She has remained in sinus rhythm. She has what appears to be acute on chronic heart failure. She has wall motion abnormalities on echocardiogram. She has some element of community-acquired pneumonia and has COPD at baseline. She has diabetes which is fairly well controlled. She probably has coronary atherosclerosis but that is to be determined Active Problems:   Atrial fibrillation with RVR (HCC)   HTN (hypertension)   Diabetes mellitus (HCC)   Acute CHF (congestive  heart failure) ([place])   CAP (community acquired pneumonia)   Acute on chronic systolic heart failure ([place])    Plan: Check chest x-ray. There was some question as to whether she truly had pneumonia or whether this was heart failure. She has been coughing up some purulent sputum and is being treated for community-acquired pneumonia chest x-ray is to see if her infiltrates look better. Continue with other treatments. Plan is to transfer to [facility] later for cardiac catheterization. She may need increase in diuretics and will discuss that with cardiology    LOS: 3 days   Jataya Wann L 12/21/2015, 8:00 AM

## 2015-12-21 NOTE — Progress Notes (Signed)
Subjective:  Short of breath moving in bed to sit up. No chest pain.  Objective:  Vital Signs in the last 24 hours: Temp:  [97 F (36.1 C)-98.2 F (36.8 C)] 97.7 F (36.5 C) (05/23 0400) Pulse Rate:  [65-88] 73 (05/23 0600) Resp:  [15-27] 18 (05/23 0600) BP: (99-153)/(54-89) 122/89 mmHg (05/23 0600) SpO2:  [94 %-100 %] 100 % (05/23 0600) Weight:  [173 lb 4.5 oz (78.6 kg)] 173 lb 4.5 oz (78.6 kg) (05/23 0500)  Intake/Output from previous day: 05/22 0701 - 05/23 0700 In: 1820 [P.O.:1280; I.V.:240; IV Piggyback:300] Out: -   Physical Exam: NECK: Without JVD, HJR, or bruit LUNGS:Decreased breath sounds right lung. No rales HEART: Regular rate and rhythm, no murmur, gallop, rub, bruit, thrill, or heave EXTREMITIES: Without cyanosis, clubbing, or edema  Lab Results:  Recent Labs  12/19/15 0509  WBC 11.6*  HGB 10.1*  PLT 202    Recent Labs  12/20/15 0441 12/21/15 0458  NA 135 137  K 3.4* 4.4  CL 99* 101  CO2 29 29  GLUCOSE 176* 163*  BUN 18 20  CREATININE 0.87 0.89    Recent Labs  12/18/15 1643 12/18/15 2348  TROPONINI 0.11* 0.15*    Recent Labs  12/21/15 0458  CHOL 165    Cardiac Studies:  Echocardiogram:12/18/2015 Left ventricle: The cavity size was normal. Systolic function was   moderately to severely reduced. The estimated ejection fraction   was in the range of 30% to 35%. Dyskinesis of the anteroseptal,   anterior, and apical myocardium. - Aortic valve: Valve area (VTI): 2.03 cm^2. Valve area (Vmax):   2.04 cm^2. Valve area (Vmean): 1.9 cm^2. - Mitral valve: Moderately to severely calcified annulus. Mildly   calcified leaflets . There was mild to moderate regurgitation.   Valve area by continuity equation (using LVOT flow): 1.11 cm^2. - Left atrium: The atrium was mildly dilated. - Pulmonary arteries: Systolic pressure was increased. PA peak   pressure: 40 mm Hg (S).  Assessment/Plan:   1. Atrial fibrillation with RVR: Maintaining  sinus rhythm with LBBB after DCCV in ER due to hypotension. She is now on coreg and ASA. CHADSVASC Score of 6. Mildly elevated troponin and cardiomyopathy, will ultimately need cardiac catheterization to rule out underlying ischemic heart disease. Transition to oral anticoagulation after catheterization.  2. Acute systolic heart failure: :I/O's positive 1820. CXR pending. Will increase Lasix 40 mg BID today. Discussed with Dr. Juanetta Gosling. Will diurese today and plan to send to [facility] tomorrow for catheterization. Echo reveals EF of 30-35%. On coreg 6.25 mg BID. BP has been low normal. Consider adding ACE-I or ARB later.  3. Pneumonia: On IV antibiotics per Dr. Juanetta Gosling.  4. Hypertension: At home on  olmesartan-amlodipine-HCTZ (Tribenzor).   LOS: 3 days   Jacolyn Reedy 12/21/2015, 7:53 AM  Attending note:  Patient seen and examined. Discussed with Ms. Leslie Bers PA-C and modified above note. Ms. Guttierrez was dyspneic this AM, no chest pain, intake greater than output. She remains in sinus rhythm on telemetry. Current medications include aspirin, Coreg, Lovenox, and Lasix with potassium supplements. Plan is still ultimately a cardiac catheterization for further evaluation of coronary anatomy in the setting of acute systolic heart failure and cardiomyopathy. We are holding off on anticoagulation for atrial fibrillation until after invasive testing is completed. Agree with increasing Lasix to 40 mg IV twice daily. Depending on clinical progress tomorrow, we may consider transfer to [facility] at that time. Hold off on  ARB for now.  Jonelle Sidle, M.D., F.A.C.C.

## 2015-12-21 NOTE — Care Management Note (Signed)
Case Management Note  Patient Details  Name: HAMIDA JIMINEZ MRN: [id number] Date of Birth: 01-19-1936  Subjective/Objective:                  Pt admitted with a-fib and CHF. Pt is from home, lives with her husband, pt's son at the bedside. Pt is ind with ADL's at baseline. Pt has PCP, drives herself to appointments and has no difficulty affording her medications. Pt ambulates independently and has no respiratory DME prior to admission. Pt currently on supplemental O2. Anticipate transfer to [facility] in next 1-2 days for cardiac cath.   Action/Plan: Pt may need home O2 assessment if still requiring supplemental O2 at DC. Anticipate DC home with self care. Will cont to follow for DC planning.   Expected Discharge Date:     12/23/2015             Expected Discharge Plan:  Home/Self Care  In-House Referral:  NA  Discharge planning Services  CM Consult  Post Acute Care Choice:  NA Choice offered to:  NA  DME Arranged:    DME Agency:     HH Arranged:    HH Agency:     Status of Service:  In process, will continue to follow  Medicare Important Message Given:    Date Medicare IM Given:    Medicare IM give by:    Date Additional Medicare IM Given:    Additional Medicare Important Message give by:     If discussed at Long Length of Stay Meetings, dates discussed:    Additional Comments:  Malcolm Metro, RN 12/21/2015, 2:25 PM

## 2015-12-21 NOTE — Care Management Important Message (Signed)
Important Message  Patient Details  Name: Leslie Kim MRN: [id number] Date of Birth: 04-01-1936   Medicare Important Message Given:  Yes    Malcolm Metro, RN 12/21/2015, 2:27 PM

## 2015-12-22 LAB — BASIC METABOLIC PANEL
Anion gap: 7 (ref 5–15)
BUN: 21 mg/dL — ABNORMAL HIGH (ref 6–20)
CHLORIDE: 93 mmol/L — AB (ref 101–111)
CO2: 34 mmol/L — AB (ref 22–32)
CREATININE: 1.04 mg/dL — AB (ref 0.44–1.00)
Calcium: 9.6 mg/dL (ref 8.9–10.3)
GFR calc non Af Amer: 50 mL/min — ABNORMAL LOW (ref >60)
GFR, EST AFRICAN AMERICAN: 59 mL/min — AB (ref >60)
Glucose, Bld: 281 mg/dL — ABNORMAL HIGH (ref 65–99)
POTASSIUM: 4.5 mmol/L (ref 3.5–5.1)
SODIUM: 134 mmol/L — AB (ref 135–145)

## 2015-12-22 LAB — GLUCOSE, CAPILLARY
GLUCOSE-CAPILLARY: 186 mg/dL — AB (ref 65–99)
GLUCOSE-CAPILLARY: 174 mg/dL — AB (ref 65–99)
GLUCOSE-CAPILLARY: 232 mg/dL — AB (ref 65–99)
Glucose-Capillary: 183 mg/dL — ABNORMAL HIGH (ref 65–99)

## 2015-12-22 LAB — CBC WITH DIFFERENTIAL/PLATELET
BASOS ABS: 0 10*3/uL (ref 0.0–0.1)
BASOS PCT: 0 %
EOS ABS: 0.2 10*3/uL (ref 0.0–0.7)
Eosinophils Relative: 2 %
HEMATOCRIT: 35.3 % — AB (ref 36.0–46.0)
HEMOGLOBIN: 10.9 g/dL — AB (ref 12.0–15.0)
Lymphocytes Relative: 27 %
Lymphs Abs: 2.4 10*3/uL (ref 0.7–4.0)
MCH: 26.3 pg (ref 26.0–34.0)
MCHC: 30.9 g/dL (ref 30.0–36.0)
MCV: 85.1 fL (ref 78.0–100.0)
Monocytes Absolute: 0.4 10*3/uL (ref 0.1–1.0)
Monocytes Relative: 5 %
NEUTROS ABS: 5.7 10*3/uL (ref 1.7–7.7)
NEUTROS PCT: 66 %
Platelets: 284 10*3/uL (ref 150–400)
RBC: 4.15 MIL/uL (ref 3.87–5.11)
RDW: 15.1 % (ref 11.5–15.5)
WBC: 8.7 10*3/uL (ref 4.0–10.5)

## 2015-12-22 MED ORDER — LOSARTAN POTASSIUM 25 MG PO TABS
25.0000 mg | ORAL_TABLET | Freq: Every day | ORAL | Status: DC
  Administered 2015-12-22 – 2015-12-24 (×3): 25 mg via ORAL
  Filled 2015-12-22 (×3): qty 1

## 2015-12-22 MED ORDER — AZITHROMYCIN 250 MG PO TABS
250.0000 mg | ORAL_TABLET | Freq: Every day | ORAL | Status: DC
  Administered 2015-12-22 – 2015-12-24 (×3): 250 mg via ORAL
  Filled 2015-12-22 (×3): qty 1

## 2015-12-22 NOTE — Care Management Important Message (Signed)
Important Message  Patient Details  Name: Leslie Kim MRN: [id number] Date of Birth: [birth year]   Medicare Important Message Given:  Yes    Malcolm Metro, RN 12/22/2015, 11:39 AM

## 2015-12-22 NOTE — Progress Notes (Signed)
Consulting cardiologist: Dr. Jonelle Sidle  Seen for followup: Acute systolic heart failure, transient atrial fibrillation  Subjective:    Breathing more easily today. No chest pain or palpitations. Stable appetite.  Objective:   Temp:  [96.9 F (36.1 C)-98 F (36.7 C)] 98 F (36.7 C) (05/24 0400) Pulse Rate:  [69-86] 74 (05/24 0600) Resp:  [16-28] 18 (05/24 0600) BP: (81-153)/(53-96) 148/69 mmHg (05/24 0600) SpO2:  [98 %-100 %] 100 % (05/24 0600) FiO2 (%):  [32 %] 32 % (05/23 0757) Weight:  [173 lb 4.5 oz (78.6 kg)] 173 lb 4.5 oz (78.6 kg) (05/24 0500) Last BM Date: 12/17/15  Filed Weights   12/20/15 0500 12/21/15 0500 12/22/15 0500  Weight: 173 lb 15.1 oz (78.9 kg) 173 lb 4.5 oz (78.6 kg) 173 lb 4.5 oz (78.6 kg)    Intake/Output Summary (Last 24 hours) at 12/22/15 0756 Last data filed at 12/21/15 1200  Gross per 24 hour  Intake    780 ml  Output      0 ml  Net    780 ml    Telemetry: Sinus rhythm.  Exam:  General: Elderly woman, no distress.  Lungs: Decreased breath sounds with scattered rhonchi, no egophony or wheezing.  Cardiac: RRR with soft apical systolic murmur.  Abdomen: NABS.  Extremities: No pitting edema.  Lab Results:  Basic Metabolic Panel:  Recent Labs Lab 12/18/15 1133 12/19/15 0509 12/20/15 0441 12/21/15 0458  NA  --  134* 135 137  K  --  3.0* 3.4* 4.4  CL  --  98* 99* 101  CO2  --  25 29 29   GLUCOSE  --  183* 176* 163*  BUN  --  23* 18 20  CREATININE  --  1.05* 0.87 0.89  CALCIUM  --  8.6* 8.5* 8.8*  MG 1.4* 2.0 1.6*  --     CBC:  Recent Labs Lab 12/18/15 0650 12/19/15 0509  WBC 13.4* 11.6*  HGB 10.9* 10.1*  HCT 33.3* 30.8*  MCV 80.6 81.1  PLT 242 202    Cardiac Enzymes:  Recent Labs Lab 12/18/15 1133 12/18/15 1643 12/18/15 2348  TROPONINI 0.06* 0.11* 0.15*    Echocardiogram 12/18/2015: Study Conclusions  - Left ventricle: The cavity size was normal. Systolic function was  moderately to  severely reduced. The estimated ejection fraction  was in the range of 30% to 35%. Dyskinesis of the anteroseptal,  anterior, and apical myocardium. - Aortic valve: Valve area (VTI): 2.03 cm^2. Valve area (Vmax):  2.04 cm^2. Valve area (Vmean): 1.9 cm^2. - Mitral valve: Moderately to severely calcified annulus. Mildly  calcified leaflets . There was mild to moderate regurgitation.  Valve area by continuity equation (using LVOT flow): 1.11 cm^2. - Left atrium: The atrium was mildly dilated. - Pulmonary arteries: Systolic pressure was increased. PA peak  pressure: 40 mm Hg (S).  Chest x-ray 12/21/2015: FINDINGS: Stable heart size and vascular pattern. Previous right upper lobe opacity nearly resolved. Previous left lower lobe opacity also nearly resolved. No pleural effusion.  IMPRESSION: Near complete resolution of previous bilateral opacities   Medications:   Scheduled Medications: . aspirin EC  81 mg Oral Daily  . azithromycin  500 mg Intravenous Q24H  . carvedilol  6.25 mg Oral BID WC  . cefTRIAXone (ROCEPHIN)  IV  1 g Intravenous Q24H  . enoxaparin (LOVENOX) injection  80 mg Subcutaneous Q12H  . furosemide  40 mg Intravenous BID  . guaiFENesin  1,200 mg Oral BID  .  insulin aspart  0-15 Units Subcutaneous TID WC  . insulin aspart  0-5 Units Subcutaneous QHS  . pantoprazole  40 mg Oral Daily  . polyethylene glycol  17 g Oral Daily  . potassium chloride  40 mEq Oral TID  . sodium chloride flush  3 mL Intravenous Q12H  . vitamin B-12  1,000 mcg Oral Daily     PRN Medications:  sodium chloride, acetaminophen, levalbuterol, ondansetron (ZOFRAN) IV, sodium chloride flush   Assessment:   1. Acute systolic heart failure with LVEF of 30-35% by echocardiogram. Onset of cardiomyopathy uncertain but symptoms worsened approximately one week prior to presentation. She feels better on IV Lasix (intake and output are incomplete) and chest x-ray shows clearing.  2. Transient  atrial fibrillation with RVR. Patient was cardioverted in the ER at presentation due to hypotension on diltiazem. CHADSVASC score is 6. Have held off anticoagulation anticipating upcoming cardiac catheterization. She has maintained sinus rhythm.  3. Possible community-acquired pneumonia. She has been treated with Rocephin per Dr. Juanetta Gosling. Afebrile and normal white blood cell count. Rapid improvement in chest x-ray also suggests improving pulmonary edema.  4. Essential hypertension.  5. Elevated troponin I, peak 0.15. Likely secondary to demand ischemia with acute systolic heart failure and rapid atrial fibrillation.  6. LDL 97.   Plan/Discussion:    Discussed and family. Will arrange transfer to our cardiology service at [facility]. Scheduling right and left heart catheterization for tomorrow for evaluation of coronary anatomy and hemodynamics to help guide further treatment. Continue aspirin, Coreg, Lovenox, and Lasix. Add low dose ARB. We will check with Dr. Juanetta Gosling regarding conversion to oral antibiotics to complete course for possible CAP.   Jonelle Sidle, M.D., F.A.C.C.

## 2015-12-22 NOTE — Progress Notes (Signed)
Subjective: She says she feels better. She has less shortness of breath this morning. No chest pain. No cough.  Objective: Vital signs in last 24 hours: Temp:  [96.9 F (36.1 C)-98 F (36.7 C)] 98 F (36.7 C) (05/24 0400) Pulse Rate:  [69-86] 74 (05/24 0600) Resp:  [16-28] 18 (05/24 0600) BP: (81-153)/(53-96) 148/69 mmHg (05/24 0600) SpO2:  [98 %-100 %] 100 % (05/24 0600) Weight:  [78.6 kg (173 lb 4.5 oz)] 78.6 kg (173 lb 4.5 oz) (05/24 0500) Weight change: 0 kg (0 lb) Last BM Date: 12/17/15  Intake/Output from previous day: 05/23 0701 - 05/24 0700 In: 780 [P.O.:480; IV Piggyback:300] Out: -   PHYSICAL EXAM General appearance: alert, cooperative and no distress Resp: clear to auscultation bilaterally Cardio: regular rate and rhythm, S1, S2 normal, no murmur, click, rub or gallop GI: soft, non-tender; bowel sounds normal; no masses,  no organomegaly Extremities: extremities normal, atraumatic, no cyanosis or edema  Lab Results:  Results for orders placed or performed during the hospital encounter of 12/18/15 (from the past 48 hour(s))  Glucose, capillary     Status: Abnormal   Collection Time: 12/20/15 11:39 AM  Result Value Ref Range   Glucose-Capillary 161 (H) 65 - 99 mg/dL   Comment 1 Notify RN    Comment 2 Document in Chart   Glucose, capillary     Status: Abnormal   Collection Time: 12/20/15  4:35 PM  Result Value Ref Range   Glucose-Capillary 132 (H) 65 - 99 mg/dL  Glucose, capillary     Status: Abnormal   Collection Time: 12/20/15  9:34 PM  Result Value Ref Range   Glucose-Capillary 206 (H) 65 - 99 mg/dL   Comment 1 Notify RN   Basic metabolic panel     Status: Abnormal   Collection Time: 12/21/15  4:58 AM  Result Value Ref Range   Sodium 137 135 - 145 mmol/L   Potassium 4.4 3.5 - 5.1 mmol/L    Comment: DELTA CHECK NOTED   Chloride 101 101 - 111 mmol/L   CO2 29 22 - 32 mmol/L   Glucose, Bld 163 (H) 65 - 99 mg/dL   BUN 20 6 - 20 mg/dL   Creatinine, Ser  0.89 0.44 - 1.00 mg/dL   Calcium 8.8 (L) 8.9 - 10.3 mg/dL   GFR calc non Af Amer >60 >60 mL/min   GFR calc Af Amer >60 >60 mL/min    Comment: (NOTE) The eGFR has been calculated using the CKD EPI equation. This calculation has not been validated in all clinical situations. eGFR's persistently <60 mL/min signify possible Chronic Kidney Disease.    Anion gap 7 5 - 15  Lipid panel     Status: None   Collection Time: 12/21/15  4:58 AM  Result Value Ref Range   Cholesterol 165 0 - 200 mg/dL   Triglycerides 97 <150 mg/dL   HDL 49 >40 mg/dL   Total CHOL/HDL Ratio 3.4 RATIO   VLDL 19 0 - 40 mg/dL   LDL Cholesterol 97 0 - 99 mg/dL    Comment:        Total Cholesterol/HDL:CHD Risk Coronary Heart Disease Risk Table                     Men   Women  1/2 Average Risk   3.4   3.3  Average Risk       5.0   4.4  2 X Average Risk   9.6  7.1  3 X Average Risk  23.4   11.0        Use the calculated Patient Ratio above and the CHD Risk Table to determine the patient's CHD Risk.        ATP III CLASSIFICATION (LDL):  <100     mg/dL   Optimal  100-129  mg/dL   Near or Above                    Optimal  130-159  mg/dL   Borderline  160-189  mg/dL   High  >190     mg/dL   Very High   Glucose, capillary     Status: Abnormal   Collection Time: 12/21/15  7:23 AM  Result Value Ref Range   Glucose-Capillary 173 (H) 65 - 99 mg/dL   Comment 1 Notify RN    Comment 2 Document in Chart   Glucose, capillary     Status: Abnormal   Collection Time: 12/21/15 11:35 AM  Result Value Ref Range   Glucose-Capillary 228 (H) 65 - 99 mg/dL   Comment 1 Notify RN    Comment 2 Document in Chart   Glucose, capillary     Status: Abnormal   Collection Time: 12/21/15  4:25 PM  Result Value Ref Range   Glucose-Capillary 135 (H) 65 - 99 mg/dL   Comment 1 Notify RN    Comment 2 Document in Chart   Glucose, capillary     Status: Abnormal   Collection Time: 12/21/15  9:10 PM  Result Value Ref Range    Glucose-Capillary 176 (H) 65 - 99 mg/dL   Comment 1 Notify RN    Comment 2 Document in Chart   Glucose, capillary     Status: Abnormal   Collection Time: 12/22/15  7:45 AM  Result Value Ref Range   Glucose-Capillary 186 (H) 65 - 99 mg/dL   Comment 1 Notify RN    Comment 2 Document in Chart     ABGS No results for input(s): PHART, PO2ART, TCO2, HCO3 in the last 72 hours.  Invalid input(s): PCO2 CULTURES Recent Results (from the past 240 hour(s))  Culture, blood (routine x 2)     Status: None (Preliminary result)   Collection Time: 12/18/15  8:17 AM  Result Value Ref Range Status   Specimen Description BLOOD LEFT ANTECUBITAL  Final   Special Requests BOTTLES DRAWN AEROBIC AND ANAEROBIC 6CC  Final   Culture NO GROWTH 3 DAYS  Final   Report Status PENDING  Incomplete  Culture, blood (routine x 2)     Status: None (Preliminary result)   Collection Time: 12/18/15  8:24 AM  Result Value Ref Range Status   Specimen Description BLOOD BLOOD RIGHT HAND  Final   Special Requests BOTTLES DRAWN AEROBIC ONLY 6CC  Final   Culture NO GROWTH 3 DAYS  Final   Report Status PENDING  Incomplete  MRSA PCR Screening     Status: None   Collection Time: 12/18/15 11:13 AM  Result Value Ref Range Status   MRSA by PCR NEGATIVE NEGATIVE Final    Comment:        The GeneXpert MRSA Assay (FDA approved for NASAL specimens only), is one component of a comprehensive MRSA colonization surveillance program. It is not intended to diagnose MRSA infection nor to guide or monitor treatment for MRSA infections.    Studies/Results: Dg Chest Port 1 View  12/21/2015  CLINICAL DATA:  Shortness of breath EXAM: PORTABLE CHEST  1 VIEW COMPARISON:  12/18/2015 FINDINGS: Stable heart size and vascular pattern. Previous right upper lobe opacity nearly resolved. Previous left lower lobe opacity also nearly resolved. No pleural effusion. IMPRESSION: Near complete resolution of previous bilateral opacities Electronically  Signed   By: Skipper Cliche M.D.   On: 12/21/2015 08:50    Medications:  Prior to Admission:  Prescriptions prior to admission  Medication Sig Dispense Refill Last Dose  . acetaminophen (TYLENOL) 500 MG tablet Take 1,000 mg by mouth every 6 (six) hours as needed. Pain   unknown  . aspirin EC 81 MG tablet Take 81 mg by mouth daily.   12/16/2015  . Cranberry 500 MG CAPS Take 1 capsule by mouth daily.   12/16/2015  . esomeprazole (NEXIUM) 40 MG capsule Take 40 mg by mouth daily.   12/16/2015  . Olmesartan-Amlodipine-HCTZ (TRIBENZOR) 40-10-25 MG TABS Take 1 tablet by mouth daily.   12/17/2015 at Unknown time  . sitaGLIPtan-metformin (JANUMET) 50-1000 MG per tablet Take 1 tablet by mouth 2 (two) times daily.   12/16/2015  . vitamin B-12 (CYANOCOBALAMIN) 1000 MCG tablet Take 1,000 mcg by mouth daily.   12/16/2015   Scheduled: . aspirin EC  81 mg Oral Daily  . azithromycin  500 mg Intravenous Q24H  . carvedilol  6.25 mg Oral BID WC  . cefTRIAXone (ROCEPHIN)  IV  1 g Intravenous Q24H  . enoxaparin (LOVENOX) injection  80 mg Subcutaneous Q12H  . furosemide  40 mg Intravenous BID  . guaiFENesin  1,200 mg Oral BID  . insulin aspart  0-15 Units Subcutaneous TID WC  . insulin aspart  0-5 Units Subcutaneous QHS  . pantoprazole  40 mg Oral Daily  . polyethylene glycol  17 g Oral Daily  . potassium chloride  40 mEq Oral TID  . sodium chloride flush  3 mL Intravenous Q12H  . vitamin B-12  1,000 mcg Oral Daily   Continuous:  GBT:DVVOHY chloride, acetaminophen, levalbuterol, ondansetron (ZOFRAN) IV, sodium chloride flush  Assesment: She was admitted with atrial fibrillation with rapid ventricular response and had cardioversion in the emergency department because she did not respond to medical treatment. She has had elevated troponin probably multifactorial. Echocardiogram shows that she has systolic heart failure and I think this is acute on chronic. She also has some wall motion abnormalities. She has  been treated for pneumonia and her chest x-ray yesterday showed near complete resolution of the infiltrate. This may have been focal luminary edema but she did have some cough and congestion and has known COPD. She has diabetes which is been pretty well controlled and hypertension in her medications for that are being adjusted. She had an increase in her dose of Lasix yesterday but intake and output is incomplete with no urine output recorded she looks better Active Problems:   Atrial fibrillation with RVR (HCC)   HTN (hypertension)   Diabetes mellitus (HCC)   Acute CHF (congestive heart failure) (HCC)   CAP (community acquired pneumonia)   Acute on chronic systolic heart failure ([place])    Plan: Switch to oral antibiotic probably just for a total of 5 days. Continue other treatments. She looks clinically better although we can't tell if she is diuresed based on her intake and output. Okay from my point of view for transfer    LOS: 4 days   Leslie Kim L 12/22/2015, 8:05 AM

## 2015-12-22 NOTE — Progress Notes (Signed)
Report called to 3 west RN @[facility]. Waiting for [facility] to transport pt this evening.

## 2015-12-23 ENCOUNTER — Encounter (HOSPITAL_COMMUNITY)
Admission: EM | Disposition: A | Discharge status: Home / Self Care | Payer: Self-pay | Source: Home / Self Care | Attending: Pulmonary Disease

## 2015-12-23 DIAGNOSIS — I5020 Unspecified systolic (congestive) heart failure: Secondary | ICD-10-CM

## 2015-12-23 DIAGNOSIS — I10 Essential (primary) hypertension: Secondary | ICD-10-CM

## 2015-12-23 DIAGNOSIS — R7989 Other specified abnormal findings of blood chemistry: Secondary | ICD-10-CM

## 2015-12-23 DIAGNOSIS — I4891 Unspecified atrial fibrillation: Secondary | ICD-10-CM

## 2015-12-23 DIAGNOSIS — I5023 Acute on chronic systolic (congestive) heart failure: Secondary | ICD-10-CM

## 2015-12-23 DIAGNOSIS — I2510 Atherosclerotic heart disease of native coronary artery without angina pectoris: Secondary | ICD-10-CM

## 2015-12-23 DIAGNOSIS — J189 Pneumonia, unspecified organism: Secondary | ICD-10-CM

## 2015-12-23 HISTORY — PX: CARDIAC CATHETERIZATION: SHX172

## 2015-12-23 LAB — GLUCOSE, CAPILLARY
GLUCOSE-CAPILLARY: 203 mg/dL — AB (ref 65–99)
GLUCOSE-CAPILLARY: 158 mg/dL — AB (ref 65–99)
GLUCOSE-CAPILLARY: 153 mg/dL — AB (ref 65–99)
GLUCOSE-CAPILLARY: 167 mg/dL — AB (ref 65–99)
Glucose-Capillary: 228 mg/dL — ABNORMAL HIGH (ref 65–99)

## 2015-12-23 LAB — CULTURE, BLOOD (ROUTINE X 2)
CULTURE: NO GROWTH
Culture: NO GROWTH

## 2015-12-23 LAB — POCT I-STAT 3, VENOUS BLOOD GAS (G3P V)
Acid-Base Excess: 6 mmol/L — ABNORMAL HIGH (ref 0.0–2.0)
BICARBONATE: 32.8 meq/L — AB (ref 20.0–24.0)
O2 Saturation: 62 %
PCO2 VEN: 60.5 mmHg — AB (ref 45.0–50.0)
PH VEN: 7.342 — AB (ref 7.250–7.300)
PO2 VEN: 35 mmHg (ref 31.0–45.0)
TCO2: 35 mmol/L (ref 0–100)

## 2015-12-23 LAB — PROTIME-INR
INR: 1.12 (ref 0.00–1.49)
Prothrombin Time: 14.6 seconds (ref 11.6–15.2)

## 2015-12-23 SURGERY — RIGHT/LEFT HEART CATH AND CORONARY ANGIOGRAPHY

## 2015-12-23 MED ORDER — MIDAZOLAM HCL 2 MG/2ML IJ SOLN
INTRAMUSCULAR | Status: AC
  Filled 2015-12-23: qty 2

## 2015-12-23 MED ORDER — HEPARIN (PORCINE) IN NACL 2-0.9 UNIT/ML-% IJ SOLN
INTRAMUSCULAR | Status: DC | PRN
  Administered 2015-12-23: 1000 mL

## 2015-12-23 MED ORDER — IOPAMIDOL (ISOVUE-370) INJECTION 76%
INTRAVENOUS | Status: AC
  Filled 2015-12-23: qty 100

## 2015-12-23 MED ORDER — NITROGLYCERIN 1 MG/10 ML FOR IR/CATH LAB
INTRA_ARTERIAL | Status: DC | PRN
  Administered 2015-12-23: 200 ug via INTRACORONARY

## 2015-12-23 MED ORDER — LIDOCAINE HCL (PF) 1 % IJ SOLN
INTRAMUSCULAR | Status: AC
  Filled 2015-12-23: qty 30

## 2015-12-23 MED ORDER — MIDAZOLAM HCL 2 MG/2ML IJ SOLN
INTRAMUSCULAR | Status: DC | PRN
  Administered 2015-12-23 (×2): 1 mg via INTRAVENOUS

## 2015-12-23 MED ORDER — HEPARIN SODIUM (PORCINE) 1000 UNIT/ML IJ SOLN
INTRAMUSCULAR | Status: AC
  Filled 2015-12-23: qty 1

## 2015-12-23 MED ORDER — SODIUM CHLORIDE 0.9 % IV SOLN: INTRAVENOUS | Status: DC

## 2015-12-23 MED ORDER — APIXABAN 5 MG PO TABS
5.0000 mg | ORAL_TABLET | Freq: Two times a day (BID) | ORAL | Status: DC
  Administered 2015-12-24: 5 mg via ORAL
  Filled 2015-12-23: qty 1

## 2015-12-23 MED ORDER — SODIUM CHLORIDE 0.9% FLUSH: 3.0000 mL | Freq: Two times a day (BID) | INTRAVENOUS | Status: DC

## 2015-12-23 MED ORDER — SODIUM CHLORIDE 0.9 % IV SOLN: 250.0000 mL | INTRAVENOUS | Status: DC | PRN

## 2015-12-23 MED ORDER — FUROSEMIDE 10 MG/ML IJ SOLN: 40.0000 mg | Freq: Two times a day (BID) | INTRAMUSCULAR | Status: DC

## 2015-12-23 MED ORDER — LIDOCAINE HCL (PF) 1 % IJ SOLN
INTRAMUSCULAR | Status: DC | PRN
  Administered 2015-12-23: 20 mL
  Administered 2015-12-23: 2 mL

## 2015-12-23 MED ORDER — FUROSEMIDE 10 MG/ML IJ SOLN
40.0000 mg | Freq: Two times a day (BID) | INTRAMUSCULAR | Status: DC
  Administered 2015-12-23: 40 mg via INTRAVENOUS
  Filled 2015-12-23 (×2): qty 4

## 2015-12-23 MED ORDER — FENTANYL CITRATE (PF) 100 MCG/2ML IJ SOLN
INTRAMUSCULAR | Status: AC
  Filled 2015-12-23: qty 2

## 2015-12-23 MED ORDER — SODIUM CHLORIDE 0.9% FLUSH
3.0000 mL | INTRAVENOUS | Status: DC | PRN
Start: 2015-12-23 — End: 2015-12-23

## 2015-12-23 MED ORDER — SODIUM CHLORIDE 0.9 % WEIGHT BASED INFUSION: 1.0000 mL/kg/h | INTRAVENOUS | Status: AC

## 2015-12-23 MED ORDER — ACETAMINOPHEN 325 MG PO TABS: 650.0000 mg | ORAL_TABLET | ORAL | Status: DC | PRN

## 2015-12-23 MED ORDER — FENTANYL CITRATE (PF) 100 MCG/2ML IJ SOLN
INTRAMUSCULAR | Status: DC | PRN
  Administered 2015-12-23 (×2): 25 ug via INTRAVENOUS

## 2015-12-23 MED ORDER — NITROGLYCERIN 1 MG/10 ML FOR IR/CATH LAB
INTRA_ARTERIAL | Status: AC
  Filled 2015-12-23: qty 10

## 2015-12-23 MED ORDER — VERAPAMIL HCL 2.5 MG/ML IV SOLN
INTRAVENOUS | Status: AC
  Filled 2015-12-23: qty 2

## 2015-12-23 MED ORDER — ONDANSETRON HCL 4 MG/2ML IJ SOLN: 4.0000 mg | Freq: Four times a day (QID) | INTRAMUSCULAR | Status: DC | PRN

## 2015-12-23 MED ORDER — HEPARIN SODIUM (PORCINE) 1000 UNIT/ML IJ SOLN
INTRAMUSCULAR | Status: DC | PRN
  Administered 2015-12-23: 4000 [IU] via INTRAVENOUS

## 2015-12-23 MED ORDER — SODIUM CHLORIDE 0.9% FLUSH: 3.0000 mL | INTRAVENOUS | Status: DC | PRN

## 2015-12-23 MED ORDER — HEPARIN (PORCINE) IN NACL 2-0.9 UNIT/ML-% IJ SOLN
INTRAMUSCULAR | Status: AC
  Filled 2015-12-23: qty 500

## 2015-12-23 MED ORDER — SODIUM CHLORIDE 0.9% FLUSH
3.0000 mL | Freq: Two times a day (BID) | INTRAVENOUS | Status: DC
  Administered 2015-12-23 – 2015-12-24 (×2): 3 mL via INTRAVENOUS

## 2015-12-23 SURGICAL SUPPLY — 13 items
CATH INFINITI 5 FR JL3.5 (CATHETERS) ×4 IMPLANT
CATH INFINITI JR4 5F (CATHETERS) ×2 IMPLANT
CATH SWAN GANZ 7F STRAIGHT (CATHETERS) ×2 IMPLANT
DEVICE RAD COMP TR BAND LRG (VASCULAR PRODUCTS) ×3 IMPLANT
GLIDESHEATH SLEND SS 6F .021 (SHEATH) ×2 IMPLANT
KIT HEART LEFT (KITS) ×3 IMPLANT
KIT HEART RIGHT NAMIC (KITS) ×3 IMPLANT
PACK CARDIAC CATHETERIZATION (CUSTOM PROCEDURE TRAY) ×3 IMPLANT
SHEATH PINNACLE 7F 10CM (SHEATH) ×2 IMPLANT
TRANSDUCER W/STOPCOCK (MISCELLANEOUS) ×3 IMPLANT
TUBING CIL FLEX 10 FLL-RA (TUBING) ×3 IMPLANT
WIRE HI TORQ VERSACORE-J 145CM (WIRE) ×2 IMPLANT
WIRE SAFE-T 1.5MM-J .035X260CM (WIRE) ×2 IMPLANT

## 2015-12-23 NOTE — Progress Notes (Signed)
Patient arrived via stretcher by [facility] and update given on VS and route to [facility] events, will get her settled and show her where everything is and get her ready for a heart cath in the am, VSS with no c/o pain voiced by patient.

## 2015-12-23 NOTE — Interval H&P Note (Signed)
Cath Lab Visit (complete for each Cath Lab visit)  Clinical Evaluation Leading to the Procedure:   ACS: No.  Non-ACS:    Anginal Classification: CCS III  Anti-ischemic medical therapy: Minimal Therapy (1 class of medications)  Non-Invasive Test Results: No non-invasive testing performed  Prior CABG: No previous CABG      History and Physical Interval Note:  12/23/2015 4:32 PM  [name]  has presented today for surgery, with the diagnosis of cp  The various methods of treatment have been discussed with the patient and family. After consideration of risks, benefits and other options for treatment, the patient has consented to  Procedure(s): Right/Left Heart Cath and Coronary Angiography (N/A) as a surgical intervention .  The patient's history has been reviewed, patient examined, no change in status, stable for surgery.  I have reviewed the patient's chart and labs.  Questions were answered to the patient's satisfaction.     Dalton [employer]

## 2015-12-23 NOTE — Progress Notes (Signed)
DAILY PROGRESS NOTE  Subjective:  No events overnight. Breathing is good. Transferred from [facility] for R/LHC due to cardiomyopathy and elevated troponin.  Objective:  Temp:  [96 F (35.6 C)-98.2 F (36.8 C)] 98.1 F (36.7 C) (05/25 0731) Pulse Rate:  [73-83] 80 (05/25 0908) Resp:  [17-29] 28 (05/25 0731) BP: (113-157)/(49-112) 139/78 mmHg (05/25 0908) SpO2:  [98 %-100 %] 98 % (05/25 0731) Weight:  [169 lb (76.658 kg)] 169 lb (76.658 kg) (05/25 0037) Weight change: -4 lb 4.5 oz (-1.942 kg)  Intake/Output from previous day: 05/24 0701 - 05/25 0700 In: 680 [P.O.:680] Out: 400 [Urine:400]  Intake/Output from this shift:    Medications: Current Facility-Administered Medications  Medication Dose Route Frequency Provider Last Rate Last Dose  . 0.9 %  sodium chloride infusion  250 mL Intravenous PRN Kathie Dike, MD 10 mL/hr at 12/21/15 0701 250 mL at 12/21/15 0701  . acetaminophen (TYLENOL) tablet 650 mg  650 mg Oral Q4H PRN Kathie Dike, MD      . aspirin EC tablet 81 mg  81 mg Oral Daily Kathie Dike, MD   81 mg at 12/22/15 0946  . azithromycin (ZITHROMAX) tablet 250 mg  250 mg Oral Daily Sinda Du, MD   250 mg at 12/22/15 0945  . carvedilol (COREG) tablet 6.25 mg  6.25 mg Oral BID WC Lendon Colonel, NP   6.25 mg at 12/23/15 0908  . enoxaparin (LOVENOX) injection 80 mg  80 mg Subcutaneous Q12H Kathie Dike, MD   80 mg at 12/22/15 2336  . furosemide (LASIX) injection 40 mg  40 mg Intravenous BID Imogene Burn, PA-C   40 mg at 12/22/15 1732  . guaiFENesin (MUCINEX) 12 hr tablet 1,200 mg  1,200 mg Oral BID Kathie Dike, MD   1,200 mg at 12/22/15 2127  . insulin aspart (novoLOG) injection 0-15 Units  0-15 Units Subcutaneous TID WC Kathie Dike, MD   3 Units at 12/22/15 1732  . insulin aspart (novoLOG) injection 0-5 Units  0-5 Units Subcutaneous QHS Kathie Dike, MD   2 Units at 12/22/15 2122  . levalbuterol (XOPENEX) nebulizer solution 0.63 mg  0.63 mg  Nebulization Q6H PRN Kathie Dike, MD   0.63 mg at 12/20/15 2256  . losartan (COZAAR) tablet 25 mg  25 mg Oral Daily Satira Sark, MD   25 mg at 12/22/15 0947  . ondansetron (ZOFRAN) injection 4 mg  4 mg Intravenous Q6H PRN Kathie Dike, MD      . pantoprazole (PROTONIX) EC tablet 40 mg  40 mg Oral Daily Kathie Dike, MD   40 mg at 12/22/15 0946  . polyethylene glycol (MIRALAX / GLYCOLAX) packet 17 g  17 g Oral Daily Sinda Du, MD   17 g at 12/21/15 0931  . potassium chloride SA (K-DUR,KLOR-CON) CR tablet 40 mEq  40 mEq Oral TID Sinda Du, MD   40 mEq at 12/22/15 2124  . sodium chloride flush (NS) 0.9 % injection 3 mL  3 mL Intravenous Q12H Kathie Dike, MD   3 mL at 12/22/15 2128  . sodium chloride flush (NS) 0.9 % injection 3 mL  3 mL Intravenous PRN Kathie Dike, MD      . vitamin B-12 (CYANOCOBALAMIN) tablet 1,000 mcg  1,000 mcg Oral Daily Kathie Dike, MD   1,000 mcg at 12/22/15 1031    Physical Exam: General appearance: alert and no distress Neck: no carotid bruit and no JVD Lungs: clear to auscultation bilaterally Heart: regular rate and rhythm,  S1, S2 normal, no murmur, click, rub or gallop Abdomen: soft, non-tender; bowel sounds normal; no masses,  no organomegaly Extremities: extremities normal, atraumatic, no cyanosis or edema Pulses: 2+ and symmetric Skin: Skin color, texture, turgor normal. No rashes or lesions Neurologic: Grossly normal Psych: Pleasant  Lab Results: Results for orders placed or performed during the hospital encounter of 12/18/15 (from the past 48 hour(s))  Glucose, capillary     Status: Abnormal   Collection Time: 12/21/15 11:35 AM  Result Value Ref Range   Glucose-Capillary 228 (H) 65 - 99 mg/dL   Comment 1 Notify RN    Comment 2 Document in Chart   Glucose, capillary     Status: Abnormal   Collection Time: 12/21/15  4:25 PM  Result Value Ref Range   Glucose-Capillary 135 (H) 65 - 99 mg/dL   Comment 1 Notify RN    Comment  2 Document in Chart   Glucose, capillary     Status: Abnormal   Collection Time: 12/21/15  9:10 PM  Result Value Ref Range   Glucose-Capillary 176 (H) 65 - 99 mg/dL   Comment 1 Notify RN    Comment 2 Document in Chart   Glucose, capillary     Status: Abnormal   Collection Time: 12/22/15  7:45 AM  Result Value Ref Range   Glucose-Capillary 186 (H) 65 - 99 mg/dL   Comment 1 Notify RN    Comment 2 Document in Chart   Basic metabolic panel     Status: Abnormal   Collection Time: 12/22/15  9:20 AM  Result Value Ref Range   Sodium 134 (L) 135 - 145 mmol/L   Potassium 4.5 3.5 - 5.1 mmol/L   Chloride 93 (L) 101 - 111 mmol/L   CO2 34 (H) 22 - 32 mmol/L   Glucose, Bld 281 (H) 65 - 99 mg/dL   BUN 21 (H) 6 - 20 mg/dL   Creatinine, Ser 1.04 (H) 0.44 - 1.00 mg/dL   Calcium 9.6 8.9 - 10.3 mg/dL   GFR calc non Af Amer 50 (L) >60 mL/min   GFR calc Af Amer 59 (L) >60 mL/min    Comment: (NOTE) The eGFR has been calculated using the CKD EPI equation. This calculation has not been validated in all clinical situations. eGFR's persistently <60 mL/min signify possible Chronic Kidney Disease.    Anion gap 7 5 - 15  CBC with Differential/Platelet     Status: Abnormal   Collection Time: 12/22/15  9:20 AM  Result Value Ref Range   WBC 8.7 4.0 - 10.5 K/uL   RBC 4.15 3.87 - 5.11 MIL/uL   Hemoglobin 10.9 (L) 12.0 - 15.0 g/dL   HCT 35.3 (L) 36.0 - 46.0 %   MCV 85.1 78.0 - 100.0 fL   MCH 26.3 26.0 - 34.0 pg   MCHC 30.9 30.0 - 36.0 g/dL   RDW 15.1 11.5 - 15.5 %   Platelets 284 150 - 400 K/uL   Neutrophils Relative % 66 %   Neutro Abs 5.7 1.7 - 7.7 K/uL   Lymphocytes Relative 27 %   Lymphs Abs 2.4 0.7 - 4.0 K/uL   Monocytes Relative 5 %   Monocytes Absolute 0.4 0.1 - 1.0 K/uL   Eosinophils Relative 2 %   Eosinophils Absolute 0.2 0.0 - 0.7 K/uL   Basophils Relative 0 %   Basophils Absolute 0.0 0.0 - 0.1 K/uL  Glucose, capillary     Status: Abnormal   Collection Time: 12/22/15 11:15 AM  Result  Value Ref Range   Glucose-Capillary 174 (H) 65 - 99 mg/dL   Comment 1 Notify RN    Comment 2 Document in Chart   Glucose, capillary     Status: Abnormal   Collection Time: 12/22/15  4:22 PM  Result Value Ref Range   Glucose-Capillary 183 (H) 65 - 99 mg/dL   Comment 1 Notify RN    Comment 2 Document in Chart   Glucose, capillary     Status: Abnormal   Collection Time: 12/22/15  9:17 PM  Result Value Ref Range   Glucose-Capillary 232 (H) 65 - 99 mg/dL  Glucose, capillary     Status: Abnormal   Collection Time: 12/23/15  8:58 AM  Result Value Ref Range   Glucose-Capillary 203 (H) 65 - 99 mg/dL    Imaging: No results found.  Assessment:  1. Active Problems: 2.   Atrial fibrillation with RVR ([place]) 3.   HTN (hypertension) 4.   Diabetes mellitus ([place]) 5.   Acute CHF (congestive heart failure) ([place]) 6.   CAP (community acquired pneumonia) 46.   Acute on chronic systolic heart failure ([place]) 8.   Plan:  1. Plan for [facility] today. Discussed risks/benefits/alternatives with her and she is agreeable to proceed. Clear liquid diet this am and NPO after that.   Time Spent Directly with Patient:  15 minutes  Length of Stay:  LOS: 5 days   Leslie Casino, MD, [facility] Attending Cardiologist [place] 12/23/2015, 9:42 AM

## 2015-12-23 NOTE — Progress Notes (Addendum)
Site area: RFV Site Prior to Removal:  Level 0 Pressure Applied For:10 min Manual:  yes  Patient Status During Pull:stable   Post Pull Site:  Level 0 Post Pull Instructions Given:yes   Post Pull Pulses Present: palpable Dressing Applied:  tegaderm Bedrest begins @ 1800 till 2000 Comments:

## 2015-12-23 NOTE — H&P (View-Only) (Signed)
DAILY PROGRESS NOTE  Subjective:  No events overnight. Breathing is good. Transferred from [facility] for R/LHC due to cardiomyopathy and elevated troponin.  Objective:  Temp:  [96 F (35.6 C)-98.2 F (36.8 C)] 98.1 F (36.7 C) (05/25 0731) Pulse Rate:  [73-83] 80 (05/25 0908) Resp:  [17-29] 28 (05/25 0731) BP: (113-157)/(49-112) 139/78 mmHg (05/25 0908) SpO2:  [98 %-100 %] 98 % (05/25 0731) Weight:  [169 lb (76.658 kg)] 169 lb (76.658 kg) (05/25 0037) Weight change: -4 lb 4.5 oz (-1.942 kg)  Intake/Output from previous day: 05/24 0701 - 05/25 0700 In: 680 [P.O.:680] Out: 400 [Urine:400]  Intake/Output from this shift:    Medications: Current Facility-Administered Medications  Medication Dose Route Frequency Provider Last Rate Last Dose  . 0.9 %  sodium chloride infusion  250 mL Intravenous PRN Kathie Dike, MD 10 mL/hr at 12/21/15 0701 250 mL at 12/21/15 0701  . acetaminophen (TYLENOL) tablet 650 mg  650 mg Oral Q4H PRN Kathie Dike, MD      . aspirin EC tablet 81 mg  81 mg Oral Daily Kathie Dike, MD   81 mg at 12/22/15 0946  . azithromycin (ZITHROMAX) tablet 250 mg  250 mg Oral Daily Sinda Du, MD   250 mg at 12/22/15 0945  . carvedilol (COREG) tablet 6.25 mg  6.25 mg Oral BID WC Lendon Colonel, NP   6.25 mg at 12/23/15 0908  . enoxaparin (LOVENOX) injection 80 mg  80 mg Subcutaneous Q12H Kathie Dike, MD   80 mg at 12/22/15 2336  . furosemide (LASIX) injection 40 mg  40 mg Intravenous BID Imogene Burn, PA-C   40 mg at 12/22/15 1732  . guaiFENesin (MUCINEX) 12 hr tablet 1,200 mg  1,200 mg Oral BID Kathie Dike, MD   1,200 mg at 12/22/15 2127  . insulin aspart (novoLOG) injection 0-15 Units  0-15 Units Subcutaneous TID WC Kathie Dike, MD   3 Units at 12/22/15 1732  . insulin aspart (novoLOG) injection 0-5 Units  0-5 Units Subcutaneous QHS Kathie Dike, MD   2 Units at 12/22/15 2122  . levalbuterol (XOPENEX) nebulizer solution 0.63 mg  0.63 mg  Nebulization Q6H PRN Kathie Dike, MD   0.63 mg at 12/20/15 2256  . losartan (COZAAR) tablet 25 mg  25 mg Oral Daily Satira Sark, MD   25 mg at 12/22/15 0947  . ondansetron (ZOFRAN) injection 4 mg  4 mg Intravenous Q6H PRN Kathie Dike, MD      . pantoprazole (PROTONIX) EC tablet 40 mg  40 mg Oral Daily Kathie Dike, MD   40 mg at 12/22/15 0946  . polyethylene glycol (MIRALAX / GLYCOLAX) packet 17 g  17 g Oral Daily Sinda Du, MD   17 g at 12/21/15 0931  . potassium chloride SA (K-DUR,KLOR-CON) CR tablet 40 mEq  40 mEq Oral TID Sinda Du, MD   40 mEq at 12/22/15 2124  . sodium chloride flush (NS) 0.9 % injection 3 mL  3 mL Intravenous Q12H Kathie Dike, MD   3 mL at 12/22/15 2128  . sodium chloride flush (NS) 0.9 % injection 3 mL  3 mL Intravenous PRN Kathie Dike, MD      . vitamin B-12 (CYANOCOBALAMIN) tablet 1,000 mcg  1,000 mcg Oral Daily Kathie Dike, MD   1,000 mcg at 12/22/15 2094    Physical Exam: General appearance: alert and no distress Neck: no carotid bruit and no JVD Lungs: clear to auscultation bilaterally Heart: regular rate and rhythm,  S1, S2 normal, no murmur, click, rub or gallop Abdomen: soft, non-tender; bowel sounds normal; no masses,  no organomegaly Extremities: extremities normal, atraumatic, no cyanosis or edema Pulses: 2+ and symmetric Skin: Skin color, texture, turgor normal. No rashes or lesions Neurologic: Grossly normal Psych: Pleasant  Lab Results: Results for orders placed or performed during the hospital encounter of 12/18/15 (from the past 48 hour(s))  Glucose, capillary     Status: Abnormal   Collection Time: 12/21/15 11:35 AM  Result Value Ref Range   Glucose-Capillary 228 (H) 65 - 99 mg/dL   Comment 1 Notify RN    Comment 2 Document in Chart   Glucose, capillary     Status: Abnormal   Collection Time: 12/21/15  4:25 PM  Result Value Ref Range   Glucose-Capillary 135 (H) 65 - 99 mg/dL   Comment 1 Notify RN    Comment  2 Document in Chart   Glucose, capillary     Status: Abnormal   Collection Time: 12/21/15  9:10 PM  Result Value Ref Range   Glucose-Capillary 176 (H) 65 - 99 mg/dL   Comment 1 Notify RN    Comment 2 Document in Chart   Glucose, capillary     Status: Abnormal   Collection Time: 12/22/15  7:45 AM  Result Value Ref Range   Glucose-Capillary 186 (H) 65 - 99 mg/dL   Comment 1 Notify RN    Comment 2 Document in Chart   Basic metabolic panel     Status: Abnormal   Collection Time: 12/22/15  9:20 AM  Result Value Ref Range   Sodium 134 (L) 135 - 145 mmol/L   Potassium 4.5 3.5 - 5.1 mmol/L   Chloride 93 (L) 101 - 111 mmol/L   CO2 34 (H) 22 - 32 mmol/L   Glucose, Bld 281 (H) 65 - 99 mg/dL   BUN 21 (H) 6 - 20 mg/dL   Creatinine, Ser 1.04 (H) 0.44 - 1.00 mg/dL   Calcium 9.6 8.9 - 10.3 mg/dL   GFR calc non Af Amer 50 (L) >60 mL/min   GFR calc Af Amer 59 (L) >60 mL/min    Comment: (NOTE) The eGFR has been calculated using the CKD EPI equation. This calculation has not been validated in all clinical situations. eGFR's persistently <60 mL/min signify possible Chronic Kidney Disease.    Anion gap 7 5 - 15  CBC with Differential/Platelet     Status: Abnormal   Collection Time: 12/22/15  9:20 AM  Result Value Ref Range   WBC 8.7 4.0 - 10.5 K/uL   RBC 4.15 3.87 - 5.11 MIL/uL   Hemoglobin 10.9 (L) 12.0 - 15.0 g/dL   HCT 35.3 (L) 36.0 - 46.0 %   MCV 85.1 78.0 - 100.0 fL   MCH 26.3 26.0 - 34.0 pg   MCHC 30.9 30.0 - 36.0 g/dL   RDW 15.1 11.5 - 15.5 %   Platelets 284 150 - 400 K/uL   Neutrophils Relative % 66 %   Neutro Abs 5.7 1.7 - 7.7 K/uL   Lymphocytes Relative 27 %   Lymphs Abs 2.4 0.7 - 4.0 K/uL   Monocytes Relative 5 %   Monocytes Absolute 0.4 0.1 - 1.0 K/uL   Eosinophils Relative 2 %   Eosinophils Absolute 0.2 0.0 - 0.7 K/uL   Basophils Relative 0 %   Basophils Absolute 0.0 0.0 - 0.1 K/uL  Glucose, capillary     Status: Abnormal   Collection Time: 12/22/15 11:15 AM  Result  Value Ref Range   Glucose-Capillary 174 (H) 65 - 99 mg/dL   Comment 1 Notify RN    Comment 2 Document in Chart   Glucose, capillary     Status: Abnormal   Collection Time: 12/22/15  4:22 PM  Result Value Ref Range   Glucose-Capillary 183 (H) 65 - 99 mg/dL   Comment 1 Notify RN    Comment 2 Document in Chart   Glucose, capillary     Status: Abnormal   Collection Time: 12/22/15  9:17 PM  Result Value Ref Range   Glucose-Capillary 232 (H) 65 - 99 mg/dL  Glucose, capillary     Status: Abnormal   Collection Time: 12/23/15  8:58 AM  Result Value Ref Range   Glucose-Capillary 203 (H) 65 - 99 mg/dL    Imaging: No results found.  Assessment:  1. Active Problems: 2.   Atrial fibrillation with RVR ([place]) 3.   HTN (hypertension) 4.   Diabetes mellitus ([place]) 5.   Acute CHF (congestive heart failure) ([place]) 6.   CAP (community acquired pneumonia) 40.   Acute on chronic systolic heart failure ([place]) 8.   Plan:  1. Plan for [facility] today. Discussed risks/benefits/alternatives with her and she is agreeable to proceed. Clear liquid diet this am and NPO after that.   Time Spent Directly with Patient:  15 minutes  Length of Stay:  LOS: 5 days   Pixie Casino, MD, [facility] Attending Cardiologist [place] 12/23/2015, 9:42 AM

## 2015-12-23 NOTE — Progress Notes (Signed)
Report received via phone from Robyne Peers RN, reviewed orders, labs, test, VS, meds and patient's general condition, awaiting her arrival to [facility].

## 2015-12-24 ENCOUNTER — Encounter (HOSPITAL_COMMUNITY): Payer: Self-pay | Admitting: Cardiology

## 2015-12-24 ENCOUNTER — Other Ambulatory Visit: Payer: Self-pay | Admitting: Student

## 2015-12-24 DIAGNOSIS — E118 Type 2 diabetes mellitus with unspecified complications: Secondary | ICD-10-CM

## 2015-12-24 DIAGNOSIS — Z5181 Encounter for therapeutic drug level monitoring: Secondary | ICD-10-CM

## 2015-12-24 DIAGNOSIS — I2510 Atherosclerotic heart disease of native coronary artery without angina pectoris: Secondary | ICD-10-CM

## 2015-12-24 DIAGNOSIS — E875 Hyperkalemia: Secondary | ICD-10-CM

## 2015-12-24 DIAGNOSIS — Z79899 Other long term (current) drug therapy: Secondary | ICD-10-CM

## 2015-12-24 DIAGNOSIS — I428 Other cardiomyopathies: Secondary | ICD-10-CM

## 2015-12-24 LAB — GLUCOSE, CAPILLARY
Glucose-Capillary: 183 mg/dL — ABNORMAL HIGH (ref 65–99)
Glucose-Capillary: 164 mg/dL — ABNORMAL HIGH (ref 65–99)

## 2015-12-24 LAB — BASIC METABOLIC PANEL
ANION GAP: 8 (ref 5–15)
BUN: 28 mg/dL — AB (ref 6–20)
CO2: 32 mmol/L (ref 22–32)
Calcium: 10.1 mg/dL (ref 8.9–10.3)
Chloride: 95 mmol/L — ABNORMAL LOW (ref 101–111)
Creatinine, Ser: 1.19 mg/dL — ABNORMAL HIGH (ref 0.44–1.00)
GFR calc Af Amer: 50 mL/min — ABNORMAL LOW (ref >60)
GFR calc non Af Amer: 43 mL/min — ABNORMAL LOW (ref >60)
GLUCOSE: 180 mg/dL — AB (ref 65–99)
POTASSIUM: 5.3 mmol/L — AB (ref 3.5–5.1)
Sodium: 135 mmol/L (ref 135–145)

## 2015-12-24 LAB — CBC
HCT: 34.2 % — ABNORMAL LOW (ref 36.0–46.0)
Hemoglobin: 10.4 g/dL — ABNORMAL LOW (ref 12.0–15.0)
MCH: 25.3 pg — ABNORMAL LOW (ref 26.0–34.0)
MCHC: 30.4 g/dL (ref 30.0–36.0)
MCV: 83.2 fL (ref 78.0–100.0)
Platelets: 274 10*3/uL (ref 150–400)
RBC: 4.11 MIL/uL (ref 3.87–5.11)
RDW: 15 % (ref 11.5–15.5)
WBC: 9.7 10*3/uL (ref 4.0–10.5)

## 2015-12-24 MED ORDER — LOSARTAN POTASSIUM 25 MG PO TABS: 25.0000 mg | ORAL_TABLET | Freq: Every day | ORAL | Status: DC

## 2015-12-24 MED ORDER — FUROSEMIDE 40 MG PO TABS: 40.0000 mg | ORAL_TABLET | Freq: Every day | ORAL | Status: DC

## 2015-12-24 MED ORDER — APIXABAN 5 MG PO TABS: 5.0000 mg | ORAL_TABLET | Freq: Two times a day (BID) | ORAL | Status: DC

## 2015-12-24 MED ORDER — APIXABAN 5 MG PO TABS
5.0000 mg | ORAL_TABLET | Freq: Two times a day (BID) | ORAL | Status: DC
Start: 2015-12-24 — End: 2016-01-21

## 2015-12-24 MED ORDER — CARVEDILOL 6.25 MG PO TABS
6.2500 mg | ORAL_TABLET | Freq: Two times a day (BID) | ORAL | Status: DC
Start: 2015-12-24 — End: 2016-07-26

## 2015-12-24 MED ORDER — OFF THE BEAT BOOK
Freq: Once | Status: AC
  Administered 2015-12-24: 10:00:00
  Filled 2015-12-24: qty 1

## 2015-12-24 MED ORDER — FUROSEMIDE 40 MG PO TABS
40.0000 mg | ORAL_TABLET | Freq: Every day | ORAL | Status: DC
  Administered 2015-12-24: 40 mg via ORAL
  Filled 2015-12-24: qty 1

## 2015-12-24 MED FILL — Verapamil HCl IV Soln 2.5 MG/ML: INTRAVENOUS | Qty: 2 | Status: AC

## 2015-12-24 NOTE — Discharge Instructions (Signed)
Information on my medicine - ELIQUIS (apixaban)  This medication education was reviewed with me or my healthcare representative as part of my discharge preparation.  The pharmacist that spoke with me during my hospital stay was:  Signe Colt, RPH  Why was Eliquis prescribed for you? Eliquis was prescribed for you to reduce the risk of forming blood clots that can cause a stroke if you have a medical condition called atrial fibrillation (a type of irregular heartbeat) OR to reduce the risk of a blood clots forming after orthopedic surgery.  What do You need to know about Eliquis ? Take your Eliquis TWICE DAILY - one tablet in the morning and one tablet in the evening with or without food.  It would be best to take the doses about the same time each day.  If you have difficulty swallowing the tablet whole please discuss with your pharmacist how to take the medication safely.  Take Eliquis exactly as prescribed by your doctor and DO NOT stop taking Eliquis without talking to the doctor who prescribed the medication.  Stopping may increase your risk of developing a new clot or stroke.  Refill your prescription before you run out.  After discharge, you should have regular check-up appointments with your healthcare provider that is prescribing your Eliquis.  In the future your dose may need to be changed if your kidney function or weight changes by a significant amount or as you get older.  What do you do if you miss a dose? If you miss a dose, take it as soon as you remember on the same day and resume taking twice daily.  Do not take more than one dose of ELIQUIS at the same time.  Important Safety Information A possible side effect of Eliquis is bleeding. You should call your healthcare provider right away if you experience any of the following: ? Bleeding from an injury or your nose that does not stop. ? Unusual colored urine (red or dark brown) or unusual colored stools (red or  black). ? Unusual bruising for unknown reasons. ? A serious fall or if you hit your head (even if there is no bleeding).  Some medicines may interact with Eliquis and might increase your risk of bleeding or clotting while on Eliquis. To help avoid this, consult your healthcare provider or pharmacist prior to using any new prescription or non-prescription medications, including herbals, vitamins, non-steroidal anti-inflammatory drugs (NSAIDs) and supplements.  This website has more information on Eliquis (apixaban): [email or web address].cy.

## 2015-12-24 NOTE — Progress Notes (Signed)
Ambulatory O2 saturation is acceptable. Finished 7 days of antibiotics, therefore azithromycin d/c'd. Mildly hyperkalemic today. Potassium supplementation stopped. Will need repeat BMET on Monday. Ok for discharge this afternoon.  Chrystie Nose, MD, [facility] Attending Cardiologist [facility]

## 2015-12-24 NOTE — Progress Notes (Signed)
SATURATION QUALIFICATIONS: (This note is used to comply with regulatory documentation for home oxygen)  Patient Saturations on Room Air at Rest = 90-93%  Patient Saturations on Room Air while Ambulating = 90-93%  Patient Saturations on 0 Liters of oxygen while Ambulating = 90%

## 2015-12-24 NOTE — Progress Notes (Signed)
DAILY PROGRESS NOTE  Subjective:  Breathing better - no significant obstructive CAD by cath yesterday. Moderately reduced CO/CI with fairly well compensated right heart pressures with RA 8, PCWP of 9 and PA Sat% of 62.  Objective:  Temp:  [97.7 F (36.5 C)-98.6 F (37 C)] 98.6 F (37 C) (05/26 0700) Pulse Rate:  [0-92] 79 (05/26 0700) Resp:  [0-27] 25 (05/26 0700) BP: (105-155)/(39-84) 139/79 mmHg (05/26 0700) SpO2:  [0 %-99 %] 95 % (05/26 0700) Weight:  [165 lb 9.6 oz (75.116 kg)] 165 lb 9.6 oz (75.116 kg) (05/26 0406) Weight change: -3 lb 6.4 oz (-1.542 kg)  Intake/Output from previous day: 05/25 0701 - 05/26 0700 In: 600 [P.O.:600] Out: -   Intake/Output from this shift:    Medications: Current Facility-Administered Medications  Medication Dose Route Frequency Provider Last Rate Last Dose  . 0.9 %  sodium chloride infusion  250 mL Intravenous PRN Kathie Dike, MD 10 mL/hr at 12/21/15 0701 250 mL at 12/21/15 0701  . 0.9 %  sodium chloride infusion  250 mL Intravenous PRN Larey Dresser, MD      . acetaminophen (TYLENOL) tablet 650 mg  650 mg Oral Q4H PRN Kathie Dike, MD      . acetaminophen (TYLENOL) tablet 650 mg  650 mg Oral Q4H PRN Larey Dresser, MD      . apixaban Arne [place]) tablet 5 mg  5 mg Oral BID Larey Dresser, MD      . aspirin EC tablet 81 mg  81 mg Oral Daily Kathie Dike, MD   81 mg at 12/23/15 0959  . azithromycin (ZITHROMAX) tablet 250 mg  250 mg Oral Daily Sinda Du, MD   250 mg at 12/23/15 0959  . carvedilol (COREG) tablet 6.25 mg  6.25 mg Oral BID WC Lendon Colonel, NP   6.25 mg at 12/23/15 1845  . furosemide (LASIX) injection 40 mg  40 mg Intravenous BID Sinda Du, MD   40 mg at 12/23/15 2012  . guaiFENesin (MUCINEX) 12 hr tablet 1,200 mg  1,200 mg Oral BID Kathie Dike, MD   1,200 mg at 12/23/15 2038  . insulin aspart (novoLOG) injection 0-15 Units  0-15 Units Subcutaneous TID WC Kathie Dike, MD   3 Units at 12/23/15  1848  . insulin aspart (novoLOG) injection 0-5 Units  0-5 Units Subcutaneous QHS Kathie Dike, MD   2 Units at 12/23/15 2227  . levalbuterol (XOPENEX) nebulizer solution 0.63 mg  0.63 mg Nebulization Q6H PRN Kathie Dike, MD   0.63 mg at 12/20/15 2256  . losartan (COZAAR) tablet 25 mg  25 mg Oral Daily Satira Sark, MD   25 mg at 12/23/15 1000  . ondansetron (ZOFRAN) injection 4 mg  4 mg Intravenous Q6H PRN Kathie Dike, MD      . ondansetron (ZOFRAN) injection 4 mg  4 mg Intravenous Q6H PRN Larey Dresser, MD      . pantoprazole (PROTONIX) EC tablet 40 mg  40 mg Oral Daily Kathie Dike, MD   40 mg at 12/23/15 1000  . polyethylene glycol (MIRALAX / GLYCOLAX) packet 17 g  17 g Oral Daily Sinda Du, MD   17 g at 12/21/15 0931  . potassium chloride SA (K-DUR,KLOR-CON) CR tablet 40 mEq  40 mEq Oral TID Sinda Du, MD   40 mEq at 12/23/15 1846  . sodium chloride flush (NS) 0.9 % injection 3 mL  3 mL Intravenous Q12H Kathie Dike, MD   3  mL at 12/23/15 1003  . sodium chloride flush (NS) 0.9 % injection 3 mL  3 mL Intravenous PRN Kathie Dike, MD      . sodium chloride flush (NS) 0.9 % injection 3 mL  3 mL Intravenous Q12H Larey Dresser, MD   3 mL at 12/23/15 2013  . sodium chloride flush (NS) 0.9 % injection 3 mL  3 mL Intravenous PRN Larey Dresser, MD      . vitamin B-12 (CYANOCOBALAMIN) tablet 1,000 mcg  1,000 mcg Oral Daily Kathie Dike, MD   1,000 mcg at 12/23/15 1001    Physical Exam: General appearance: alert and no distress Lungs: diminished breath sounds RLL Heart: regular rate and rhythm Extremities: extremities normal, atraumatic, no cyanosis or edema and cath site without ecchymosis, bruit or hematoma Neurologic: Grossly normal  Lab Results: Results for orders placed or performed during the hospital encounter of 12/18/15 (from the past 48 hour(s))  Basic metabolic panel     Status: Abnormal   Collection Time: 12/22/15  9:20 AM  Result Value Ref Range     Sodium 134 (L) 135 - 145 mmol/L   Potassium 4.5 3.5 - 5.1 mmol/L   Chloride 93 (L) 101 - 111 mmol/L   CO2 34 (H) 22 - 32 mmol/L   Glucose, Bld 281 (H) 65 - 99 mg/dL   BUN 21 (H) 6 - 20 mg/dL   Creatinine, Ser 1.04 (H) 0.44 - 1.00 mg/dL   Calcium 9.6 8.9 - 10.3 mg/dL   GFR calc non Af Amer 50 (L) >60 mL/min   GFR calc Af Amer 59 (L) >60 mL/min    Comment: (NOTE) The eGFR has been calculated using the CKD EPI equation. This calculation has not been validated in all clinical situations. eGFR's persistently <60 mL/min signify possible Chronic Kidney Disease.    Anion gap 7 5 - 15  CBC with Differential/Platelet     Status: Abnormal   Collection Time: 12/22/15  9:20 AM  Result Value Ref Range   WBC 8.7 4.0 - 10.5 K/uL   RBC 4.15 3.87 - 5.11 MIL/uL   Hemoglobin 10.9 (L) 12.0 - 15.0 g/dL   HCT 35.3 (L) 36.0 - 46.0 %   MCV 85.1 78.0 - 100.0 fL   MCH 26.3 26.0 - 34.0 pg   MCHC 30.9 30.0 - 36.0 g/dL   RDW 15.1 11.5 - 15.5 %   Platelets 284 150 - 400 K/uL   Neutrophils Relative % 66 %   Neutro Abs 5.7 1.7 - 7.7 K/uL   Lymphocytes Relative 27 %   Lymphs Abs 2.4 0.7 - 4.0 K/uL   Monocytes Relative 5 %   Monocytes Absolute 0.4 0.1 - 1.0 K/uL   Eosinophils Relative 2 %   Eosinophils Absolute 0.2 0.0 - 0.7 K/uL   Basophils Relative 0 %   Basophils Absolute 0.0 0.0 - 0.1 K/uL  Glucose, capillary     Status: Abnormal   Collection Time: 12/22/15 11:15 AM  Result Value Ref Range   Glucose-Capillary 174 (H) 65 - 99 mg/dL   Comment 1 Notify RN    Comment 2 Document in Chart   Glucose, capillary     Status: Abnormal   Collection Time: 12/22/15  4:22 PM  Result Value Ref Range   Glucose-Capillary 183 (H) 65 - 99 mg/dL   Comment 1 Notify RN    Comment 2 Document in Chart   Glucose, capillary     Status: Abnormal   Collection Time: 12/22/15  9:17 PM  Result Value Ref Range   Glucose-Capillary 232 (H) 65 - 99 mg/dL  Glucose, capillary     Status: Abnormal   Collection Time: 12/23/15   8:58 AM  Result Value Ref Range   Glucose-Capillary 203 (H) 65 - 99 mg/dL  Protime-INR     Status: None   Collection Time: 12/23/15 10:41 AM  Result Value Ref Range   Prothrombin Time 14.6 11.6 - 15.2 seconds   INR 1.12 0.00 - 1.49  Glucose, capillary     Status: Abnormal   Collection Time: 12/23/15 11:38 AM  Result Value Ref Range   Glucose-Capillary 158 (H) 65 - 99 mg/dL  I-STAT 3, venous blood gas (G3P V)     Status: Abnormal   Collection Time: 12/23/15  5:00 PM  Result Value Ref Range   pH, Ven 7.342 (H) 7.250 - 7.300   pCO2, Ven 60.5 (H) 45.0 - 50.0 mmHg   pO2, Ven 35.0 31.0 - 45.0 mmHg   Bicarbonate 32.8 (H) 20.0 - 24.0 mEq/L   TCO2 35 0 - 100 mmol/L   O2 Saturation 62.0 %   Acid-Base Excess 6.0 (H) 0.0 - 2.0 mmol/L   Patient temperature HIDE    Sample type VENOUS    Comment NOTIFIED PHYSICIAN   Glucose, capillary     Status: Abnormal   Collection Time: 12/23/15  5:50 PM  Result Value Ref Range   Glucose-Capillary 153 (H) 65 - 99 mg/dL  Glucose, capillary     Status: Abnormal   Collection Time: 12/23/15  6:31 PM  Result Value Ref Range   Glucose-Capillary 167 (H) 65 - 99 mg/dL  Glucose, capillary     Status: Abnormal   Collection Time: 12/23/15  9:23 PM  Result Value Ref Range   Glucose-Capillary 228 (H) 65 - 99 mg/dL  CBC     Status: Abnormal   Collection Time: 12/24/15  4:41 AM  Result Value Ref Range   WBC 9.7 4.0 - 10.5 K/uL   RBC 4.11 3.87 - 5.11 MIL/uL   Hemoglobin 10.4 (L) 12.0 - 15.0 g/dL   HCT 34.2 (L) 36.0 - 46.0 %   MCV 83.2 78.0 - 100.0 fL   MCH 25.3 (L) 26.0 - 34.0 pg   MCHC 30.4 30.0 - 36.0 g/dL   RDW 15.0 11.5 - 15.5 %   Platelets 274 150 - 400 K/uL    Imaging: No results found.  Assessment:  1. Active Problems: 2.   Atrial fibrillation with RVR ([place]) 3.   HTN (hypertension) 4.   Diabetes mellitus ([place]) 5.   Acute CHF (congestive heart failure) ([place]) 6.   CAP (community acquired pneumonia) 43.   Acute on chronic systolic heart failure  ([place]) 8.   Plan:  1. Reassuring cath findings - non-obstructive CAD and reduced CO with compensated right heart pressures. Will switch from IV to oral lasix today. She is on aspirin for CAD and Eliquis for a-fib (however, spontaneously converted back to sinus at [facility]), coreg and losartan. Vitals stable. Will need 3 more days of azithromycin (including today) to complete a 5 day course as per pulmonary recommendations. O2 sat was in the upper 80's overnight, placed back on oxygen. Advised nurse to check ambulatory O2 off oxygen today - if it stays above 88% she could be discharged home without oxygen. Follow-up with Dr. Domenic Polite in [place].  Time Spent Directly with Patient:  15 minutes   Length of Stay:  LOS: 6 days   Leslie Kim  C. Debara Pickett, MD, [facility] Attending Cardiologist [place] 12/24/2015, 7:53 AM

## 2015-12-24 NOTE — Discharge Summary (Signed)
Discharge Summary    Patient ID: Leslie Kim,  MRN: [id number], DOB/AGE: 1936/04/09 80 y.o.  Admit date: 12/18/2015 Discharge date: 12/24/2015  Primary Care Provider: HAWKINS,EDWARD Kim Primary Cardiologist: Dr. Diona Browner  Discharge Diagnoses    Principal Problem:   Atrial fibrillation with RVR (HCC) Active Problems:   HTN (hypertension)   Diabetes mellitus (HCC)   Acute CHF (congestive heart failure) (HCC)   CAP (community acquired pneumonia)   Acute on chronic systolic heart failure (HCC)   History of Present Illness     Leslie Kim is a 80 y.o. female with past medical history of COPD, HTN, and Type 2 DM who presented to [facility] on 12/18/2015 for worsening dyspnea and a non-productive cough for the past 3 days. While in the ED, her CXR was suspicious for PNA and EKG showed new onset atrial fibrillation with RVR, HR in the 180's.   She was started on a Cardizem drip at that time but became hypotensive while in the ED and underwent successful DCCV (120J, followed by 150J) with return to NSR. With her CHA2DS2-VASc Score of 7 (CHF, HTN, DM, Vascular, Female, Age (2)), she was started on Lovenox at that time with the plan to transition to a NOAC prior to discharge. She was also started on empiric antibiotic therapy and admitted for further observation.   Hospital Course     Consultants: Pulmonology   On 12/20/2015, Cardiology was consulted for her new onset atrial fibrillation along with an echocardiogram showing a newly reduced EF of 30-35% (no prior echocardiogram available for comparison). She was switched from Metoprolol to Coreg with anticipation of the addition of an ACE-I prior to discharge. She had been started on IV Lasix  BID and was diuresing well with improvement in her dyspnea.  As she continued to recover from her PNA, transfer to [facility] for a cardiac catheterization to evaluate the etiology of her cardiomyopathy was recommended to the patient.  The risks and benefits of the procedure were thoroughly reviewed with the patient and she agreed to proceed. She was therefore transferred to [facility] on 12/23/2015.  Her cardiac catheterization showed normal left and right heart filling pressures and normal cardiac output. No obstructive CAD was noted on her cath, therefore her reduced EF was consistent with a nonischemic cardiomyopathy.   The following morning, she reported her breathing was improved. With her reassuring cath and compensated right heart filling pressures, her IV Lasix was switched to PO and she was started on Eliquis for anticoagulation in regards to her new onset-atrial fibrillation. Overnight, her oxygen saturations dropped into the 80's, therefore her O2 saturation was checked with ambulation the following morning and remained at 90%. She completed her 7-day course of Abx treatment for CAP prior to discharge.   She was last examined by Dr. Rennis Golden and deemed stable for discharge. She was instructed to have a BMET next Tuesday at [facility] in [place] to further evaluate her potassium (at 5.3 on 12/24/2015). Two-week Cardiology follow-up has been arranged at our [facility] location. She was given prescriptions for her new cardiac medications along with a 30-day card to use with Eliquis. She discharged home in stable condition.  _____________  Discharge Vitals Blood pressure 120/58, pulse 78, temperature 98.1 F (36.7 C), temperature source Oral, resp. rate 18, height  (1.626 m), weight 165 lb 9.6 oz (75.116 kg), SpO2 94 %.  Filed Weights   12/22/15 0500 12/23/15 0037 12/24/15 0406  Weight:  173 lb 4.5 oz (78.6 kg) 169 lb (76.658 kg) 165 lb 9.6 oz (75.116 kg)    Labs & Radiologic Studies     CBC  Recent Labs  12/22/15 0920 12/24/15 0441  WBC 8.7 9.7  NEUTROABS 5.7  --   HGB 10.9* 10.4*  HCT 35.3* 34.2*  MCV 85.1 83.2  PLT 284 274   Basic Metabolic Panel  Recent Labs  12/22/15 0920 12/24/15 0735  NA  134* 135  K 4.5 5.3*  CL 93* 95*  CO2 34* 32  GLUCOSE 281* 180*  BUN 21* 28*  CREATININE 1.04* 1.19*  CALCIUM 9.6 10.1    Dg Chest Port 1 View: 12/21/2015  CLINICAL DATA:  Shortness of breath EXAM: PORTABLE CHEST 1 VIEW COMPARISON:  12/18/2015 FINDINGS: Stable heart size and vascular pattern. Previous right upper lobe opacity nearly resolved. Previous left lower lobe opacity also nearly resolved. No pleural effusion. IMPRESSION: Near complete resolution of previous bilateral opacities Electronically Signed   By: Esperanza Heir M.D.   On: 12/21/2015 08:50   Dg Chest Portable 1 View: 12/18/2015  CLINICAL DATA:  Chest pain EXAM: PORTABLE CHEST 1 VIEW COMPARISON:  None. FINDINGS: Mild focal/patchy opacity in the right upper lobe. Additional patchy opacities in the bilateral lower lobes. Overall, this appearance is considered suspicious for multifocal pneumonia. No definite pleural effusions.  No pneumothorax. Heart is normal in size. IMPRESSION: Mild patchy opacities in the right upper lobe and bilateral lower lobes, suspicious for pneumonia. Electronically Signed   By: Charline Bills M.D.   On: 12/18/2015 08:01    Diagnostic Studies/Procedures     Cardiac Catheterization: 12/23/2015  Dominance: Co-dominant   Left Main  Short, no significant disease.     Left Anterior Descending  Luminal irregularities in the LAD. 30% distal LAD stenosis. Moderate D1 with 30% ostial stenosis.     Left Circumflex  Large, co-dominant vessel. Moderate ramus, small OM1, large branching OM2 that gave off a left-sided PDA branch. 40% stenosis mid AV LCx just after OM2 take-off.     Right Coronary Artery  Unusual RCA configuration. Larger caliber RCA at the ostium narrowed in the proximal RCA considerably. The RCA remained small throughout its course. There were luminal irregularities but no significant atherosclerotic stenosis. No evidence for coronary artery dissection. 200 mcg IC NTG was given, no change  in the appearance of the artery so not vasospasm.       Right Heart Pressures RHC Procedural Findings (mmHg): Hemodynamics RA 8 RV 34/8 PA 26/15, mean 19 PCWP mean 9 LV 127/12 AO 132/61  Oxygen saturations: PA 62% AO 94%  Cardiac Output (Fick) 5.07  Cardiac Index (Fick) 2.8  Cardiac Output (Thermo) 4.67 Cardiac Index (Thermo) 2.58    1. Normal left and right heart filling pressures and normal cardiac output.  2. Nonobstructive CAD.  3. Nonischemic cardiomyopathy.   Echocardiogram: 12/18/2015 Study Conclusions - Left ventricle: The cavity size was normal. Systolic function was  moderately to severely reduced. The estimated ejection fraction  was in the range of 30% to 35%. Dyskinesis of the anteroseptal,  anterior, and apical myocardium. - Aortic valve: Valve area (VTI): 2.03 cm^2. Valve area (Vmax):  2.04 cm^2. Valve area (Vmean): 1.9 cm^2. - Mitral valve: Moderately to severely calcified annulus. Mildly  calcified leaflets . There was mild to moderate regurgitation.  Valve area by continuity equation (using LVOT flow): 1.11 cm^2. - Left atrium: The atrium was mildly dilated. - Pulmonary arteries: Systolic pressure was increased. PA peak  pressure: 40 mm Hg (S).  Disposition   Pt is being discharged home today in good condition.  Follow-up Plans & Appointments    Follow-up Information    Follow up with Joni Reining, NP On 01/07/2016.   Specialties:  Nurse Practitioner, Radiology, Cardiology   Why:  Cardiology Hospital Follow-Up on 01/07/2016 at 2:30PM (Dr. McDowell's Office).    Contact information:   [address] [place] [address] [phone]       Follow up with [facility] On 12/28/2015.   Why:  Lab Work on Tuesday (Across from [facility] Emergency Room at [facility] is closed on Monday so please go Tuesday).   Contact information:   [address] [place] [phone]      Discharge Instructions    Diet - low sodium heart healthy    Complete by:  As directed      Increase activity slowly    Complete by:  As directed            Discharge Medications   Current Discharge Medication List    START taking these medications   Details  apixaban (ELIQUIS) 5 MG TABS tablet Take 1 tablet (5 mg total) by mouth 2 (two) times daily. Qty: 60 tablet, Refills: 10    carvedilol (COREG) 6.25 MG tablet Take 1 tablet (6.25 mg total) by mouth 2 (two) times daily with a meal. Qty: 60 tablet, Refills: 6    furosemide (LASIX) 40 MG tablet Take 1 tablet (40 mg total) by mouth daily. Qty: 30 tablet, Refills: 6    losartan (COZAAR) 25 MG tablet Take 1 tablet (25 mg total) by mouth daily. Qty: 30 tablet, Refills: 6      CONTINUE these medications which have NOT CHANGED   Details  acetaminophen (TYLENOL) 500 MG tablet Take 1,000 mg by mouth every 6 (six) hours as needed. Pain    aspirin EC 81 MG tablet Take 81 mg by mouth daily.    Cranberry 500 MG CAPS Take 1 capsule by mouth daily.    esomeprazole (NEXIUM) 40 MG capsule Take 40 mg by mouth daily.    sitaGLIPtan-metformin (JANUMET) 50-1000 MG per tablet Take 1 tablet by mouth 2 (two) times daily.    vitamin B-12 (CYANOCOBALAMIN) 1000 MCG tablet Take 1,000 mcg by mouth daily.      STOP taking these medications     Olmesartan-Amlodipine-HCTZ (TRIBENZOR) 40-10-25 MG TABS           Allergies Allergies  Allergen Reactions  . Codeine Other (See Comments)    Too strong for patient       Outstanding Labs/Studies   BMET on 12/28/2015.  Duration of Discharge Encounter   Greater than 30 minutes including physician time.  Signed, Ellsworth Lennox, PA-C 12/24/2015, 1:56 PM

## 2015-12-24 NOTE — Care Management Note (Signed)
Case Management Note  Patient Details  Name: Leslie Kim MRN: [id number] Date of Birth: 07-12-1936  Subjective/Objective:   Pt admitted for Atrial Fib. Plan for d/c home on Eliquis. Pt has 30 day free card.                  Action/Plan: CM did call [facility] in [place] to make sure medication is in stock and it is. Pt aware of co pay. Pt did state that for refills she would like to get a 90 Rx. If not here she can wait until later. Pt will not need home 02. No further needs from CM at this time.    Expected Discharge Date:                  Expected Discharge Plan:  Home/Self Care  In-House Referral:  NA  Discharge planning Services  CM Consult, Medication Assistance  Post Acute Care Choice:  NA Choice offered to:  NA  DME Arranged:  N/A DME Agency:  NA  HH Arranged:  NA HH Agency:  NA  Status of Service:  Completed, signed off  Medicare Important Message Given:  Yes Date Medicare IM Given:    Medicare IM give by:    Date Additional Medicare IM Given:    Additional Medicare Important Message give by:     If discussed at Long Length of Stay Meetings, dates discussed:    Additional Comments:  Gala Lewandowsky, RN 12/24/2015, 1:33 PM

## 2015-12-28 ENCOUNTER — Other Ambulatory Visit: Payer: Self-pay | Admitting: *Deleted

## 2015-12-28 DIAGNOSIS — E875 Hyperkalemia: Secondary | ICD-10-CM

## 2015-12-28 DIAGNOSIS — Z79899 Other long term (current) drug therapy: Secondary | ICD-10-CM | POA: Diagnosis not present

## 2015-12-28 DIAGNOSIS — Z5181 Encounter for therapeutic drug level monitoring: Secondary | ICD-10-CM | POA: Diagnosis not present

## 2015-12-28 LAB — BASIC METABOLIC PANEL
BUN: 22 mg/dL (ref 7–25)
CALCIUM: 9.5 mg/dL (ref 8.6–10.4)
CO2: 27 mmol/L (ref 20–31)
Chloride: 98 mmol/L (ref 98–110)
Creat: 1.08 mg/dL — ABNORMAL HIGH (ref 0.60–0.93)
Glucose, Bld: 141 mg/dL — ABNORMAL HIGH (ref 65–99)
POTASSIUM: 4.6 mmol/L (ref 3.5–5.3)
SODIUM: 135 mmol/L (ref 135–146)

## 2016-01-07 ENCOUNTER — Ambulatory Visit (INDEPENDENT_AMBULATORY_CARE_PROVIDER_SITE_OTHER): Payer: Medicare Other | Admitting: Adult Health

## 2016-01-07 ENCOUNTER — Encounter: Payer: Self-pay | Admitting: Adult Health

## 2016-01-07 VITALS — BP 124/66 | HR 89 | Ht 64.0 in | Wt 171.0 lb

## 2016-01-07 DIAGNOSIS — I481 Persistent atrial fibrillation: Secondary | ICD-10-CM | POA: Diagnosis not present

## 2016-01-07 DIAGNOSIS — I4819 Other persistent atrial fibrillation: Secondary | ICD-10-CM

## 2016-01-07 DIAGNOSIS — I119 Hypertensive heart disease without heart failure: Secondary | ICD-10-CM

## 2016-01-07 DIAGNOSIS — I43 Cardiomyopathy in diseases classified elsewhere: Secondary | ICD-10-CM

## 2016-01-07 DIAGNOSIS — I4891 Unspecified atrial fibrillation: Secondary | ICD-10-CM | POA: Insufficient documentation

## 2016-01-07 DIAGNOSIS — I509 Heart failure, unspecified: Secondary | ICD-10-CM | POA: Diagnosis not present

## 2016-01-07 NOTE — Progress Notes (Signed)
Name: Leslie Kim    DOB: 07/13/1936  Age: 80 y.o.  MR#: [id number]       PCP:  Fredirick Maudlin, MD      Insurance: Payor: [employer] MEDICARE / Plan: [facility] MEDICARE / Product Type: *No Product type* /   CC:   No chief complaint on file.   VS Filed Vitals:   01/07/16 1418  BP: 124/66  Pulse: 89  Height: 5\' 4"  (1.626 m)  Weight: 171 lb (77.565 kg)  SpO2: 93%    Weights Current Weight  01/07/16 171 lb (77.565 kg)  12/24/15 165 lb 9.6 oz (75.116 kg)    Blood Pressure  BP Readings from Last 3 Encounters:  01/07/16 124/66  12/24/15 120/58  05/21/12 135/69     Admit date:  (Not on file) Last encounter with RMR:  Visit date not found   Allergy Codeine  Current Outpatient Prescriptions  Medication Sig Dispense Refill  . acetaminophen (TYLENOL) 500 MG tablet Take 1,000 mg by mouth every 6 (six) hours as needed. Pain    . apixaban (ELIQUIS) 5 MG TABS tablet Take 1 tablet (5 mg total) by mouth 2 (two) times daily. 60 tablet 10  . aspirin EC 81 MG tablet Take 81 mg by mouth daily.    . carvedilol (COREG) 6.25 MG tablet Take 1 tablet (6.25 mg total) by mouth 2 (two) times daily with a meal. 60 tablet 6  . Cranberry 500 MG CAPS Take 1 capsule by mouth daily.    Marland Kitchen esomeprazole (NEXIUM) 40 MG capsule Take 40 mg by mouth daily.    . furosemide (LASIX) 40 MG tablet Take 1 tablet (40 mg total) by mouth daily. 30 tablet 6  . losartan (COZAAR) 25 MG tablet Take 1 tablet (25 mg total) by mouth daily. 30 tablet 6  . sitaGLIPtan-metformin (JANUMET) 50-1000 MG per tablet Take 1 tablet by mouth 2 (two) times daily.    . vitamin B-12 (CYANOCOBALAMIN) 1000 MCG tablet Take 1,000 mcg by mouth daily.     No current facility-administered medications for this visit.    Discontinued Meds:   There are no discontinued medications.  Patient Active Problem List   Diagnosis Date Noted  . Nonischemic cardiomyopathy (HCC) 12/24/2015  . Acute on chronic systolic heart failure (HCC) 12/21/2015   . Atrial fibrillation with RVR (HCC) 12/18/2015  . HTN (hypertension) 12/18/2015  . Diabetes mellitus (HCC) 12/18/2015  . CAP (community acquired pneumonia) 12/18/2015    LABS    Component Value Date/Time   NA 135 12/28/2015 1142   NA 135 12/24/2015 0735   NA 134* 12/22/2015 0920   K 4.6 12/28/2015 1142   K 5.3* 12/24/2015 0735   K 4.5 12/22/2015 0920   CL 98 12/28/2015 1142   CL 95* 12/24/2015 0735   CL 93* 12/22/2015 0920   CO2 27 12/28/2015 1142   CO2 32 12/24/2015 0735   CO2 34* 12/22/2015 0920   GLUCOSE 141* 12/28/2015 1142   GLUCOSE 180* 12/24/2015 0735   GLUCOSE 281* 12/22/2015 0920   BUN 22 12/28/2015 1142   BUN 28* 12/24/2015 0735   BUN 21* 12/22/2015 0920   CREATININE 1.08* 12/28/2015 1142   CREATININE 1.19* 12/24/2015 0735   CREATININE 1.04* 12/22/2015 0920   CREATININE 0.89 12/21/2015 0458   CALCIUM 9.5 12/28/2015 1142   CALCIUM 10.1 12/24/2015 0735   CALCIUM 9.6 12/22/2015 0920   GFRNONAA 43* 12/24/2015 0735   GFRNONAA 50* 12/22/2015 0920   GFRNONAA >60 12/21/2015 4098  GFRAA 50* 12/24/2015 0735   GFRAA 59* 12/22/2015 0920   GFRAA >60 12/21/2015 0458   CMP     Component Value Date/Time   NA 135 12/28/2015 1142   K 4.6 12/28/2015 1142   CL 98 12/28/2015 1142   CO2 27 12/28/2015 1142   GLUCOSE 141* 12/28/2015 1142   BUN 22 12/28/2015 1142   CREATININE 1.08* 12/28/2015 1142   CREATININE 1.19* 12/24/2015 0735   CALCIUM 9.5 12/28/2015 1142   GFRNONAA 43* 12/24/2015 0735   GFRAA 50* 12/24/2015 0735       Component Value Date/Time   WBC 9.7 12/24/2015 0441   WBC 8.7 12/22/2015 0920   WBC 11.6* 12/19/2015 0509   HGB 10.4* 12/24/2015 0441   HGB 10.9* 12/22/2015 0920   HGB 10.1* 12/19/2015 0509   HCT 34.2* 12/24/2015 0441   HCT 35.3* 12/22/2015 0920   HCT 30.8* 12/19/2015 0509   MCV 83.2 12/24/2015 0441   MCV 85.1 12/22/2015 0920   MCV 81.1 12/19/2015 0509    Lipid Panel     Component Value Date/Time   CHOL 165 12/21/2015 0458   TRIG  97 12/21/2015 0458   HDL 49 12/21/2015 0458   CHOLHDL 3.4 12/21/2015 0458   VLDL 19 12/21/2015 0458   LDLCALC 97 12/21/2015 0458    ABG    Component Value Date/Time   HCO3 32.8* 12/23/2015 1700   TCO2 35 12/23/2015 1700   O2SAT 62.0 12/23/2015 1700     Lab Results  Component Value Date   TSH 3.456 12/18/2015   BNP (last 3 results)  Recent Labs  12/18/15 0650  BNP 661.0*    ProBNP (last 3 results) No results for input(s): PROBNP in the last 8760 hours.  Cardiac Panel (last 3 results) No results for input(s): CKTOTAL, CKMB, TROPONINI, RELINDX in the last 72 hours.  Iron/TIBC/Ferritin/ %Sat No results found for: IRON, TIBC, FERRITIN, IRONPCTSAT   EKG Orders placed or performed during the hospital encounter of 12/18/15  . EKG 12-Lead  . EKG 12-Lead  . ED EKG  . ED EKG  . EKG 12-Lead  . EKG 12-Lead  . EKG 12-Lead  . EKG 12-Lead  . EKG 12-Lead  . EKG 12-Lead  . EKG 12-Lead  . EKG 12-Lead  . EKG 12-Lead  . EKG 12-Lead  . EKG     Prior Assessment and Plan Problem List as of 01/07/2016      Cardiovascular and Mediastinum   Atrial fibrillation with RVR (HCC)   HTN (hypertension)   Acute on chronic systolic heart failure (HCC)   Nonischemic cardiomyopathy (HCC)     Respiratory   CAP (community acquired pneumonia)     Endocrine   Diabetes mellitus [facility])       Imaging: Dg Chest Port 1 View  12/21/2015  CLINICAL DATA:  Shortness of breath EXAM: PORTABLE CHEST 1 VIEW COMPARISON:  12/18/2015 FINDINGS: Stable heart size and vascular pattern. Previous right upper lobe opacity nearly resolved. Previous left lower lobe opacity also nearly resolved. No pleural effusion. IMPRESSION: Near complete resolution of previous bilateral opacities Electronically Signed   By: Esperanza Heir M.D.   On: 12/21/2015 08:50   Dg Chest Portable 1 View  12/18/2015  CLINICAL DATA:  Chest pain EXAM: PORTABLE CHEST 1 VIEW COMPARISON:  None. FINDINGS: Mild focal/patchy opacity in the  right upper lobe. Additional patchy opacities in the bilateral lower lobes. Overall, this appearance is considered suspicious for multifocal pneumonia. No definite pleural effusions.  No pneumothorax. Heart  is normal in size. IMPRESSION: Mild patchy opacities in the right upper lobe and bilateral lower lobes, suspicious for pneumonia. Electronically Signed   By: Charline Bills M.D.   On: 12/18/2015 08:01

## 2016-01-07 NOTE — Progress Notes (Signed)
Cardiology Office Note   Date:  01/07/2016   ID:  Leslie Kim, DOB 19-Nov-1935, MRN [id number]  PCP:  Fredirick Maudlin, MD  Cardiologist: McDowell/  Joni Reining, NP   No chief complaint on file.     History of Present Illness: Leslie Kim is a 80 y.o. female who presents for ongoing assessment and management of atrial fibrillation with RVR, hypertension, with history of type 2 diabetes, and COPD. The patient was started on a diltiazem drip but became hypotensive while in ED and underwent successful direct current cardioversion. (120 J., followed by 150 J). CHADS VASC Score of 7.   The patient was transferred to [facility] for cardiac catheterization to evaluate the etiology of cardiomyopathy as she had an echocardiogram which revealed newly reduced EF of 30-35%. Her cardiac catheterization showed normal left and right filling pressures and normal cardiac output. No obstructive CAD was noted on her cath, and therefore her reduced EF was consistent with nonischemic cardiomyopathy. The patient had mild CHF and was diuresed and switched to Lasix by mouth, she started on ELIQUIS for anticoagulation. She was also treated for community-acquired pneumonia for the seven-day course of antibiotics. She is here for TOC followup.  Today she feels mildly short of breath.this is just occurred this morning. She admits to having trouble avoiding salty foods. She continues to take her Lasix as directed. She states she is having some fatigue from [place] medications. She has changed her medication regimen timing to take the Lasix after lunch, but now complains that it is keeping her up at night urinating. She denies any dizziness, fluid retention, or chest discomfort. She had no bleeding on ELIQUIS.  Past Medical History  Diagnosis Date  . Type 2 diabetes mellitus (HCC)   . Essential hypertension   . New onset atrial fibrillation (HCC) 11/2015    a. s/p DCCV on 12/18/2015 b. started on Eliquis  .  Nonischemic cardiomyopathy (HCC)     a. 11/2015: echo showing EF of 30-35%, cath with nonobstructive dz    Past Surgical History  Procedure Laterality Date  . Cholecystectomy    . Cardiac catheterization N/A 12/23/2015    Procedure: Right/Left Heart Cath and Coronary Angiography;  Surgeon: Laurey Morale, MD;  Location: [facility] INVASIVE CV LAB;  Service: Cardiovascular;  Laterality: N/A;     Current Outpatient Prescriptions  Medication Sig Dispense Refill  . acetaminophen (TYLENOL) 500 MG tablet Take 1,000 mg by mouth every 6 (six) hours as needed. Pain    . apixaban (ELIQUIS) 5 MG TABS tablet Take 1 tablet (5 mg total) by mouth 2 (two) times daily. 60 tablet 10  . aspirin EC 81 MG tablet Take 81 mg by mouth daily.    . carvedilol (COREG) 6.25 MG tablet Take 1 tablet (6.25 mg total) by mouth 2 (two) times daily with a meal. 60 tablet 6  . Cranberry 500 MG CAPS Take 1 capsule by mouth daily.    Marland Kitchen esomeprazole (NEXIUM) 40 MG capsule Take 40 mg by mouth daily.    . furosemide (LASIX) 40 MG tablet Take 1 tablet (40 mg total) by mouth daily. 30 tablet 6  . losartan (COZAAR) 25 MG tablet Take 1 tablet (25 mg total) by mouth daily. 30 tablet 6  . sitaGLIPtan-metformin (JANUMET) 50-1000 MG per tablet Take 1 tablet by mouth 2 (two) times daily.    . vitamin B-12 (CYANOCOBALAMIN) 1000 MCG tablet Take 1,000 mcg by mouth daily.     No current facility-administered  medications for this visit.    Allergies:   Codeine    Social History:  The patient  reports that she quit smoking about 8 years ago. Her smoking use included Cigarettes. She does not have any smokeless tobacco history on file. She reports that she does not drink alcohol or use illicit drugs.   Family History:  The patient's family history includes Cancer in her father; Diabetes in her brother, brother, brother, brother, brother, father, and mother.    ROS: All other systems are reviewed and negative. Unless otherwise mentioned in H&P     PHYSICAL EXAM: VS:  BP 124/66 mmHg  Pulse 89  Ht 5\' 4"  (1.626 m)  Wt 171 lb (77.565 kg)  BMI 29.34 kg/m2  SpO2 93% , BMI Body mass index is 29.34 kg/(m^2). GEN: Well nourished, well developed, in no acute distress HEENT: normal Neck: no JVD, carotid bruits, or masses Cardiac: RRR; no murmurs, rubs, or gallops,no edema  Respiratory:  Clear to auscultation bilaterally, normal work of breathing GI: soft, nontender, nondistended, + BS MS: no deformity or atrophy Skin: warm and dry, no rash Neuro:  Strength and sensation are intact Psych: euthymic mood, full affect  Recent Labs: 12/18/2015: B Natriuretic Peptide 661.0*; TSH 3.456 12/20/2015: Magnesium 1.6* 12/24/2015: Hemoglobin 10.4*; Platelets 274 12/28/2015: BUN 22; Creat 1.08*; Potassium 4.6; Sodium 135    Lipid Panel    Component Value Date/Time   CHOL 165 12/21/2015 0458   TRIG 97 12/21/2015 0458   HDL 49 12/21/2015 0458   CHOLHDL 3.4 12/21/2015 0458   VLDL 19 12/21/2015 0458   LDLCALC 97 12/21/2015 0458      Wt Readings from Last 3 Encounters:  01/07/16 171 lb (77.565 kg)  12/24/15 165 lb 9.6 oz (75.116 kg)    ASSESSMENT AND PLAN:  1. Atrial fibrillation with RVR: She is tolerating Apixaban and carvedilol without side effects with the exception of some mild fatigue. Continue this medication regimen. Heart rate is controlled.  2. Nonischemic cardiomyopathy:EF of 30-35%. She is having some trouble breathing today and has gained approximately 6 pounds. I've advised her to take an additional dose of Lasix this afternoon when returning home. She is given a daily weight chart and advised to weigh herself at the same time every day usually in the morning after urinating. She is been counseled on low sodium diet. She will need a followup BMET in 2 weeks and a followup appointment in one week.  3. Hypertension:moderately controlled for reduced EF. She's been advised to take her Lasix first thing in the morning and losartan  after lunch to avoid accumulative effect of medications and she states that that is why she started taking her Lasix in the afternoon. I would prefer that she take her diuretic Persing in the morning to avoid nighttime urination.   Current medicines are reviewed at length with the patient today.    Labs/ tests ordered today include: BMET  Orders Placed This Encounter  Procedures  . Basic Metabolic Panel (BMET)     Disposition:   FU with 1 month  Signed, Joni Reining, NP  01/07/2016 2:56 PM    [facility] [address] [place], [place] [address] Phone: [phone]; Fax: [phone]

## 2016-01-07 NOTE — Patient Instructions (Addendum)
Your physician recommends that you schedule a follow-up appointment in: 1 month K Lawrence NP   Please take extra 1/2 dose lasix when you get home today   Weigh daily and keep record   Lab work: BMET in 2 weeks      Thank you for choosing [facility] !

## 2016-01-18 DIAGNOSIS — Z0000 Encounter for general adult medical examination without abnormal findings: Secondary | ICD-10-CM | POA: Diagnosis not present

## 2016-01-21 ENCOUNTER — Other Ambulatory Visit: Payer: Self-pay | Admitting: Student

## 2016-01-21 DIAGNOSIS — Z0000 Encounter for general adult medical examination without abnormal findings: Secondary | ICD-10-CM | POA: Diagnosis not present

## 2016-01-21 DIAGNOSIS — E119 Type 2 diabetes mellitus without complications: Secondary | ICD-10-CM | POA: Diagnosis not present

## 2016-01-21 DIAGNOSIS — E1121 Type 2 diabetes mellitus with diabetic nephropathy: Secondary | ICD-10-CM | POA: Diagnosis not present

## 2016-01-21 DIAGNOSIS — I509 Heart failure, unspecified: Secondary | ICD-10-CM | POA: Diagnosis not present

## 2016-01-21 LAB — BASIC METABOLIC PANEL
BUN: 16 mg/dL (ref 7–25)
CALCIUM: 9 mg/dL (ref 8.6–10.4)
CO2: 27 mmol/L (ref 20–31)
CREATININE: 0.92 mg/dL (ref 0.60–0.93)
Chloride: 98 mmol/L (ref 98–110)
GLUCOSE: 154 mg/dL — AB (ref 65–99)
Potassium: 3.9 mmol/L (ref 3.5–5.3)
Sodium: 139 mmol/L (ref 135–146)

## 2016-01-24 ENCOUNTER — Telehealth: Payer: Self-pay

## 2016-01-24 NOTE — Telephone Encounter (Signed)
Prior auth for Eliquis 5 mg submitted to [facility]. 

## 2016-01-25 ENCOUNTER — Telehealth: Payer: Self-pay

## 2016-01-25 NOTE — Telephone Encounter (Signed)
Eliquis 5 mg approved through 07/30/2016. [id number].

## 2016-02-07 ENCOUNTER — Encounter: Payer: Self-pay | Admitting: Adult Health

## 2016-02-07 ENCOUNTER — Ambulatory Visit (INDEPENDENT_AMBULATORY_CARE_PROVIDER_SITE_OTHER): Payer: Medicare Other | Admitting: Adult Health

## 2016-02-07 VITALS — BP 132/64 | HR 65 | Ht 63.0 in | Wt 167.0 lb

## 2016-02-07 DIAGNOSIS — I5022 Chronic systolic (congestive) heart failure: Secondary | ICD-10-CM | POA: Diagnosis not present

## 2016-02-07 DIAGNOSIS — I10 Essential (primary) hypertension: Secondary | ICD-10-CM

## 2016-02-07 DIAGNOSIS — Z79899 Other long term (current) drug therapy: Secondary | ICD-10-CM | POA: Diagnosis not present

## 2016-02-07 MED ORDER — SACUBITRIL-VALSARTAN 24-26 MG PO TABS: 1.0000 | ORAL_TABLET | Freq: Two times a day (BID) | ORAL | Status: DC

## 2016-02-07 NOTE — Progress Notes (Signed)
Name: Leslie Kim    DOB: 10/31/1935  Age: 80 y.o.  MR#: [id number]       PCP:  Fredirick Maudlin, MD      Insurance: Payor: Advertising copywriter MEDICARE / Plan: [facility] MEDICARE / Product Type: *No Product type* /   CC:   No chief complaint on file.   VS Filed Vitals:   02/07/16 1327  Pulse: 65  Height: 5\' 3"  (1.6 m)  Weight: 167 lb (75.751 kg)  SpO2: 93%    Weights Current Weight  02/07/16 167 lb (75.751 kg)  01/07/16 171 lb (77.565 kg)  12/24/15 165 lb 9.6 oz (75.116 kg)    Blood Pressure  BP Readings from Last 3 Encounters:  01/07/16 124/66  12/24/15 120/58  05/21/12 135/69     Admit date:  (Not on file) Last encounter with RMR:  01/07/2016   Allergy Codeine  Current Outpatient Prescriptions  Medication Sig Dispense Refill  . acetaminophen (TYLENOL) 500 MG tablet Take 1,000 mg by mouth every 6 (six) hours as needed. Pain    . aspirin EC 81 MG tablet Take 81 mg by mouth daily.    . carvedilol (COREG) 6.25 MG tablet Take 1 tablet (6.25 mg total) by mouth 2 (two) times daily with a meal. 60 tablet 6  . Cranberry 500 MG CAPS Take 1 capsule by mouth daily.    Marland Kitchen ELIQUIS 5 MG TABS tablet TAKE 1 TABLET BY MOUTH TWICE DAILY 60 tablet 6  . esomeprazole (NEXIUM) 40 MG capsule Take 40 mg by mouth daily.    . furosemide (LASIX) 40 MG tablet Take 1 tablet (40 mg total) by mouth daily. 30 tablet 6  . losartan (COZAAR) 25 MG tablet Take 1 tablet (25 mg total) by mouth daily. 30 tablet 6  . sitaGLIPtan-metformin (JANUMET) 50-1000 MG per tablet Take 1 tablet by mouth 2 (two) times daily.    . vitamin B-12 (CYANOCOBALAMIN) 1000 MCG tablet Take 1,000 mcg by mouth daily.     No current facility-administered medications for this visit.    Discontinued Meds:   There are no discontinued medications.  Patient Active Problem List   Diagnosis Date Noted  . Atrial fibrillation (HCC) 01/07/2016  . Nonischemic cardiomyopathy (HCC) 12/24/2015  . Acute on chronic systolic heart failure (HCC)  71/69/6789  . Atrial fibrillation with RVR (HCC) 12/18/2015  . HTN (hypertension) 12/18/2015  . Diabetes mellitus (HCC) 12/18/2015  . CAP (community acquired pneumonia) 12/18/2015    LABS    Component Value Date/Time   NA 139 01/21/2016 0815   NA 135 12/28/2015 1142   NA 135 12/24/2015 0735   K 3.9 01/21/2016 0815   K 4.6 12/28/2015 1142   K 5.3* 12/24/2015 0735   CL 98 01/21/2016 0815   CL 98 12/28/2015 1142   CL 95* 12/24/2015 0735   CO2 27 01/21/2016 0815   CO2 27 12/28/2015 1142   CO2 32 12/24/2015 0735   GLUCOSE 154* 01/21/2016 0815   GLUCOSE 141* 12/28/2015 1142   GLUCOSE 180* 12/24/2015 0735   BUN 16 01/21/2016 0815   BUN 22 12/28/2015 1142   BUN 28* 12/24/2015 0735   CREATININE 0.92 01/21/2016 0815   CREATININE 1.08* 12/28/2015 1142   CREATININE 1.19* 12/24/2015 0735   CREATININE 1.04* 12/22/2015 0920   CREATININE 0.89 12/21/2015 0458   CALCIUM 9.0 01/21/2016 0815   CALCIUM 9.5 12/28/2015 1142   CALCIUM 10.1 12/24/2015 0735   GFRNONAA 43* 12/24/2015 0735   GFRNONAA 50* 12/22/2015 0920  GFRNONAA >60 12/21/2015 0458   GFRAA 50* 12/24/2015 0735   GFRAA 59* 12/22/2015 0920   GFRAA >60 12/21/2015 0458   CMP     Component Value Date/Time   NA 139 01/21/2016 0815   K 3.9 01/21/2016 0815   CL 98 01/21/2016 0815   CO2 27 01/21/2016 0815   GLUCOSE 154* 01/21/2016 0815   BUN 16 01/21/2016 0815   CREATININE 0.92 01/21/2016 0815   CREATININE 1.19* 12/24/2015 0735   CALCIUM 9.0 01/21/2016 0815   GFRNONAA 43* 12/24/2015 0735   GFRAA 50* 12/24/2015 0735       Component Value Date/Time   WBC 9.7 12/24/2015 0441   WBC 8.7 12/22/2015 0920   WBC 11.6* 12/19/2015 0509   HGB 10.4* 12/24/2015 0441   HGB 10.9* 12/22/2015 0920   HGB 10.1* 12/19/2015 0509   HCT 34.2* 12/24/2015 0441   HCT 35.3* 12/22/2015 0920   HCT 30.8* 12/19/2015 0509   MCV 83.2 12/24/2015 0441   MCV 85.1 12/22/2015 0920   MCV 81.1 12/19/2015 0509    Lipid Panel     Component Value  Date/Time   CHOL 165 12/21/2015 0458   TRIG 97 12/21/2015 0458   HDL 49 12/21/2015 0458   CHOLHDL 3.4 12/21/2015 0458   VLDL 19 12/21/2015 0458   LDLCALC 97 12/21/2015 0458    ABG    Component Value Date/Time   HCO3 32.8* 12/23/2015 1700   TCO2 35 12/23/2015 1700   O2SAT 62.0 12/23/2015 1700     Lab Results  Component Value Date   TSH 3.456 12/18/2015   BNP (last 3 results)  Recent Labs  12/18/15 0650  BNP 661.0*    ProBNP (last 3 results) No results for input(s): PROBNP in the last 8760 hours.  Cardiac Panel (last 3 results) No results for input(s): CKTOTAL, CKMB, TROPONINI, RELINDX in the last 72 hours.  Iron/TIBC/Ferritin/ %Sat No results found for: IRON, TIBC, FERRITIN, IRONPCTSAT   EKG Orders placed or performed during the hospital encounter of 12/18/15  . EKG 12-Lead  . EKG 12-Lead  . ED EKG  . ED EKG  . EKG 12-Lead  . EKG 12-Lead  . EKG 12-Lead  . EKG 12-Lead  . EKG 12-Lead  . EKG 12-Lead  . EKG 12-Lead  . EKG 12-Lead  . EKG 12-Lead  . EKG 12-Lead  . EKG     Prior Assessment and Plan Problem List as of 02/07/2016      Cardiovascular and Mediastinum   Atrial fibrillation with RVR (HCC)   HTN (hypertension)   Acute on chronic systolic heart failure (HCC)   Nonischemic cardiomyopathy (HCC)   Atrial fibrillation (HCC)     Respiratory   CAP (community acquired pneumonia)     Endocrine   Diabetes mellitus (HCC)       Imaging: No results found.

## 2016-02-07 NOTE — Progress Notes (Signed)
Cardiology Office Note   Date:  02/07/2016   ID:  Leslie Kim, DOB 10-16-1935, MRN [id number]  PCP:  Fredirick Maudlin, MD  Cardiologist: [place]/ Joni Reining, NP   No chief complaint on file.     History of Present Illness: Leslie Kim is a 80 y.o. female who presents for ongoing assessment and management of atrial fibrillation with RVR, hypertension with history of type 2 diabetes and COPD.she was last seen in the office on 01/07/2016 with complaints of dyspnea. She needed to having trouble avoiding salty foods. She had no bleeding with ELIQUIS.  She was advised to take her Lasix daily and additional one in the afternoon should she gain 3-5 pounds. She was re-counseled on a low sodium diet. She is advised to take losartan after lunch to avoid accumulative effect with as her morning medications.followup labs were completed sodium 139 potassium 3.9 chloride 90 CO2 27 BUN 16 creatinine 0.92.  She is still in very well today with changes in medication timing. She denies any further complaints of significant fatigue. The patient's only complaint is when she climbs stairs from her basement after doing laundry she does have some significant fatigue and has to sit down to rest. She is asking her husband to carry the laundry for her as she is unable to do so without being short of breath. She has extremely brief periods of dizziness which occur for a second or more and dissipate on its own. Denies any rapid heart rhythm or chest discomfort  Past Medical History  Diagnosis Date  . Type 2 diabetes mellitus (HCC)   . Essential hypertension   . New onset atrial fibrillation (HCC) 11/2015    a. s/p DCCV on 12/18/2015 b. started on Eliquis  . Nonischemic cardiomyopathy (HCC)     a. 11/2015: echo showing EF of 30-35%, cath with nonobstructive dz    Past Surgical History  Procedure Laterality Date  . Cholecystectomy    . Cardiac catheterization N/A 12/23/2015    Procedure: Right/Left Heart  Cath and Coronary Angiography;  Surgeon: Laurey Morale, MD;  Location: [facility] INVASIVE CV LAB;  Service: Cardiovascular;  Laterality: N/A;     Current Outpatient Prescriptions  Medication Sig Dispense Refill  . acetaminophen (TYLENOL) 500 MG tablet Take 1,000 mg by mouth every 6 (six) hours as needed. Pain    . aspirin EC 81 MG tablet Take 81 mg by mouth daily.    . carvedilol (COREG) 6.25 MG tablet Take 1 tablet (6.25 mg total) by mouth 2 (two) times daily with a meal. 60 tablet 6  . Cranberry 500 MG CAPS Take 1 capsule by mouth daily.    Marland Kitchen ELIQUIS 5 MG TABS tablet TAKE 1 TABLET BY MOUTH TWICE DAILY 60 tablet 6  . esomeprazole (NEXIUM) 40 MG capsule Take 40 mg by mouth daily.    . furosemide (LASIX) 40 MG tablet Take 1 tablet (40 mg total) by mouth daily. 30 tablet 6  . losartan (COZAAR) 25 MG tablet Take 1 tablet (25 mg total) by mouth daily. 30 tablet 6  . sitaGLIPtan-metformin (JANUMET) 50-1000 MG per tablet Take 1 tablet by mouth 2 (two) times daily.    . vitamin B-12 (CYANOCOBALAMIN) 1000 MCG tablet Take 1,000 mcg by mouth daily.     No current facility-administered medications for this visit.    Allergies:   Codeine    Social History:  The patient  reports that she quit smoking about 8 years ago. Her smoking use included  Cigarettes. She does not have any smokeless tobacco history on file. She reports that she does not drink alcohol or use illicit drugs.   Family History:  The patient's family history includes Cancer in her father; Diabetes in her brother, brother, brother, brother, brother, father, and mother.    ROS: All other systems are reviewed and negative. Unless otherwise mentioned in H&P    PHYSICAL EXAM: VS:  There were no vitals taken for this visit. , BMI There is no weight on file to calculate BMI. GEN: Well nourished, well developed, in no acute distress HEENT: normal Neck: no JVD, carotid bruits, or masses Cardiac: RRR; distant heart sounds:no murmurs, rubs, or  gallops,no edema  Respiratory:  clear to auscultation bilaterally, normal work of breathing GI: soft, nontender, nondistended, + BS MS: no deformity or atrophy Skin: warm and dry, no rash Neuro:  Strength and sensation are intact Psych: euthymic mood, full affect  Recent Labs: 12/18/2015: B Natriuretic Peptide 661.0*; TSH 3.456 12/20/2015: Magnesium 1.6* 12/24/2015: Hemoglobin 10.4*; Platelets 274 01/21/2016: BUN 16; Creat 0.92; Potassium 3.9; Sodium 139    Lipid Panel    Component Value Date/Time   CHOL 165 12/21/2015 0458   TRIG 97 12/21/2015 0458   HDL 49 12/21/2015 0458   CHOLHDL 3.4 12/21/2015 0458   VLDL 19 12/21/2015 0458   LDLCALC 97 12/21/2015 0458      Wt Readings from Last 3 Encounters:  01/07/16 171 lb (77.565 kg)  12/24/15 165 lb 9.6 oz (75.116 kg)    ASSESSMENT AND PLAN:  1. Nonischemic cardiopathy with systolic dysfunction: EF of 30-35% noted on most recent echo. We'll discontinue losartan and began contrast showed 26/24 mg. She will take this twice a day. I have given her samples and she will come back in 2 weeks for a blood pressure check on any medicine along with a BMET she is tolerating this well will give her an Rx for this.Will await her response to treatment prior to doing so. She may need financial assistance.  2. Atrial fib: heart rate is well-controlled until she climbs stairs. She then feels her heart racing. I've advised her to avoid strenuous activity for now and have her husband. A heavy laundry basket filled with clothes. She will remain on ELIQUIS as she is not having any complaints of bleeding.  Current medicines are reviewed at length with the patient today.    Labs/ tests ordered today include: BMET     Disposition:   FU with 2 weeks for a blood pressure check, 1 month for a followup appointment with BMET  Signed, Joni Reining, NP  02/07/2016 6:58 AM    [facility] [address] [place], [place]  [address] Phone: [phone]; Fax: [phone]

## 2016-02-07 NOTE — Patient Instructions (Signed)
Medication Instructions:  STOP LOSARTAN START ENTRESTO 24/26 TWO TIMES DAILY    Labwork: Your physician recommends that you return for lab work in: JUST BEFORE THE 1 MONTHS FOLLOW UP    Testing/Procedures: NONE  Follow-Up: Your physician recommends that you schedule a follow-up appointment in: 1 MONTH    Any Other Special Instructions Will Be Listed Below (If Applicable).  COME BACK IN 2 WEEKS FOR A BLOOD PRESSURE CHECK    If you need a refill on your cardiac medications before your next appointment, please call your pharmacy.

## 2016-02-10 ENCOUNTER — Telehealth: Payer: Self-pay | Admitting: Adult Health

## 2016-02-10 NOTE — Telephone Encounter (Signed)
Will forward to KL 

## 2016-02-10 NOTE — Telephone Encounter (Signed)
Have her stop the Enteresto. Restart losartan as she was taking before. Recheck BP in a few days. Sorry this medication made her feel badly.

## 2016-02-10 NOTE — Telephone Encounter (Signed)
Patient states Leslie Kim started her on Entresto Monday, 02/08/16 but she's afraid it's making her too sick to continue taking. Leslie Kim stated that she is taking the medication right after breakfast and right after dinner (twice a day) and it has made her feel light headed and dizzy each time.   The last dose Leslie Kim took was last night (Tuesday, 02/09/16)  after supper, she chose to skip her AM dose this morning and still feels light headed and dizzy. Leslie Kim reports that even though she still has these symptoms they have been improving as the day goes on.   Please advise on any medication changes we can make.

## 2016-02-14 MED ORDER — LOSARTAN POTASSIUM 25 MG PO TABS: 25.0000 mg | ORAL_TABLET | Freq: Every day | ORAL | Status: DC

## 2016-02-14 NOTE — Telephone Encounter (Signed)
Pt already stopped Entresto ,placed herself back on losartan 25 mg daily.Cannot find BP machine at home,will come tomorrow for BP check at 4 pm

## 2016-02-15 ENCOUNTER — Ambulatory Visit (INDEPENDENT_AMBULATORY_CARE_PROVIDER_SITE_OTHER): Payer: Medicare Other

## 2016-02-15 VITALS — BP 130/68 | HR 90 | Wt 167.0 lb

## 2016-02-15 DIAGNOSIS — I10 Essential (primary) hypertension: Secondary | ICD-10-CM

## 2016-02-15 NOTE — Progress Notes (Signed)
Pt was started on Entresto, but did not tolerate well. She went back on the losartan 25 mg daily. She has not had any problems. She states that she just bought a new bp monitor for home and she will keep track of her bp better than before.

## 2016-02-18 NOTE — Progress Notes (Signed)
Glad she is feeling better. Leslie Kim is not for everyone. She had samples given to her. She is to continue to keep BP record as she has been doing.

## 2016-03-13 ENCOUNTER — Encounter: Payer: Self-pay | Admitting: Adult Health

## 2016-03-13 ENCOUNTER — Ambulatory Visit (INDEPENDENT_AMBULATORY_CARE_PROVIDER_SITE_OTHER): Payer: Medicare Other | Admitting: Adult Health

## 2016-03-13 VITALS — BP 148/68 | HR 89 | Ht 63.0 in | Wt 166.0 lb

## 2016-03-13 DIAGNOSIS — Z79899 Other long term (current) drug therapy: Secondary | ICD-10-CM | POA: Diagnosis not present

## 2016-03-13 DIAGNOSIS — I429 Cardiomyopathy, unspecified: Secondary | ICD-10-CM | POA: Diagnosis not present

## 2016-03-13 DIAGNOSIS — I482 Chronic atrial fibrillation, unspecified: Secondary | ICD-10-CM

## 2016-03-13 NOTE — Progress Notes (Signed)
Name: Leslie Kim    DOB: August 01, 1935  Age: 80 y.o.  MR#: [id number]       PCP:  Fredirick MaudlinHAWKINS,EDWARD L, MD      Insurance: Payor: [employer]vertising copywriter[employer]ALTHCARE MEDICARE / [facility] / Product Type: *No Product type* /   CC:   No chief complaint on file.   VS Vitals:   03/13/16 1426  BP: (!) 148/68  Pulse: 89  SpO2: 94%  Weight: 166 lb (75.3 kg)  Height: 5\' 3"  (1.6 m)    Weights Current Weight  03/13/16 166 lb (75.3 kg)  02/15/16 167 lb (75.8 kg)  02/07/16 167 lb (75.8 kg)    Blood Pressure  BP Readings from Last 3 Encounters:  03/13/16 (!) 148/68  02/15/16 130/68  02/07/16 132/64     Admit date:  (Not on file) Last encounter with RMR:  02/10/2016   Allergy Codeine  Current Outpatient Prescriptions  Medication Sig Dispense Refill  . acetaminophen (TYLENOL) 500 MG tablet Take 1,000 mg by mouth every 6 (six) hours as needed. Pain    . aspirin EC 81 MG tablet Take 81 mg by mouth daily.    . carvedilol (COREG) 6.25 MG tablet Take 1 tablet (6.25 mg total) by mouth 2 (two) times daily with a meal. 60 tablet 6  . Cranberry 500 MG CAPS Take 1 capsule by mouth daily.    Marland Kitchen. ELIQUIS 5 MG TABS tablet TAKE 1 TABLET BY MOUTH TWICE DAILY 60 tablet 6  . esomeprazole (NEXIUM) 40 MG capsule Take 40 mg by mouth daily.    . furosemide (LASIX) 40 MG tablet Take 1 tablet (40 mg total) by mouth daily. 30 tablet 6  . losartan (COZAAR) 25 MG tablet Take 1 tablet (25 mg total) by mouth daily. 90 tablet 3  . sitaGLIPtan-metformin (JANUMET) 50-1000 MG per tablet Take 1 tablet by mouth 2 (two) times daily.    . vitamin B-12 (CYANOCOBALAMIN) 1000 MCG tablet Take 1,000 mcg by mouth daily.     No current facility-administered medications for this visit.     Discontinued Meds:   There are no discontinued medications.  Patient Active Problem List   Diagnosis Date Noted  . Atrial fibrillation (HCC) 01/07/2016  . Nonischemic cardiomyopathy (HCC) 12/24/2015  . Acute on chronic systolic heart failure (HCC)  14/78/295605/23/2017  . Atrial fibrillation with RVR (HCC) 12/18/2015  . HTN (hypertension) 12/18/2015  . Diabetes mellitus (HCC) 12/18/2015  . CAP (community acquired pneumonia) 12/18/2015    LABS    Component Value Date/Time   NA 139 01/21/2016 0815   NA 135 12/28/2015 1142   NA 135 12/24/2015 0735   K 3.9 01/21/2016 0815   K 4.6 12/28/2015 1142   K 5.3 (H) 12/24/2015 0735   CL 98 01/21/2016 0815   CL 98 12/28/2015 1142   CL 95 (L) 12/24/2015 0735   CO2 27 01/21/2016 0815   CO2 27 12/28/2015 1142   CO2 32 12/24/2015 0735   GLUCOSE 154 (H) 01/21/2016 0815   GLUCOSE 141 (H) 12/28/2015 1142   GLUCOSE 180 (H) 12/24/2015 0735   BUN 16 01/21/2016 0815   BUN 22 12/28/2015 1142   BUN 28 (H) 12/24/2015 0735   CREATININE 0.92 01/21/2016 0815   CREATININE 1.08 (H) 12/28/2015 1142   CREATININE 1.19 (H) 12/24/2015 0735   CREATININE 1.04 (H) 12/22/2015 0920   CREATININE 0.89 12/21/2015 0458   CALCIUM 9.0 01/21/2016 0815   CALCIUM 9.5 12/28/2015 1142   CALCIUM 10.1 12/24/2015 0735  GFRNONAA 43 (L) 12/24/2015 0735   GFRNONAA 50 (L) 12/22/2015 0920   GFRNONAA >60 12/21/2015 0458   GFRAA 50 (L) 12/24/2015 0735   GFRAA 59 (L) 12/22/2015 0920   GFRAA >60 12/21/2015 0458   CMP     Component Value Date/Time   NA 139 01/21/2016 0815   K 3.9 01/21/2016 0815   CL 98 01/21/2016 0815   CO2 27 01/21/2016 0815   GLUCOSE 154 (H) 01/21/2016 0815   BUN 16 01/21/2016 0815   CREATININE 0.92 01/21/2016 0815   CALCIUM 9.0 01/21/2016 0815   GFRNONAA 43 (L) 12/24/2015 0735   GFRAA 50 (L) 12/24/2015 0735       Component Value Date/Time   WBC 9.7 12/24/2015 0441   WBC 8.7 12/22/2015 0920   WBC 11.6 (H) 12/19/2015 0509   HGB 10.4 (L) 12/24/2015 0441   HGB 10.9 (L) 12/22/2015 0920   HGB 10.1 (L) 12/19/2015 0509   HCT 34.2 (L) 12/24/2015 0441   HCT 35.3 (L) 12/22/2015 0920   HCT 30.8 (L) 12/19/2015 0509   MCV 83.2 12/24/2015 0441   MCV 85.1 12/22/2015 0920   MCV 81.1 12/19/2015 0509    Lipid  Panel     Component Value Date/Time   CHOL 165 12/21/2015 0458   TRIG 97 12/21/2015 0458   HDL 49 12/21/2015 0458   CHOLHDL 3.4 12/21/2015 0458   VLDL 19 12/21/2015 0458   LDLCALC 97 12/21/2015 0458    ABG    Component Value Date/Time   HCO3 32.8 (H) 12/23/2015 1700   TCO2 35 12/23/2015 1700   O2SAT 62.0 12/23/2015 1700     Lab Results  Component Value Date   TSH 3.456 12/18/2015   BNP (last 3 results)  Recent Labs  12/18/15 0650  BNP 661.0*    ProBNP (last 3 results) No results for input(s): PROBNP in the last 8760 hours.  Cardiac Panel (last 3 results) No results for input(s): CKTOTAL, CKMB, TROPONINI, RELINDX in the last 72 hours.  Iron/TIBC/Ferritin/ %Sat No results found for: IRON, TIBC, FERRITIN, IRONPCTSAT   EKG Orders placed or performed during the hospital encounter of 12/18/15  . EKG 12-Lead  . EKG 12-Lead  . ED EKG  . ED EKG  . EKG 12-Lead  . EKG 12-Lead  . EKG 12-Lead  . EKG 12-Lead  . EKG 12-Lead  . EKG 12-Lead  . EKG 12-Lead  . EKG 12-Lead  . EKG 12-Lead  . EKG 12-Lead  . EKG     Prior Assessment and Plan Problem List as of 03/13/2016 Reviewed: 02/07/2016  2:17 PM by Joni Reining, NP     Cardiovascular and Mediastinum   Atrial fibrillation with RVR (HCC)   HTN (hypertension)   Acute on chronic systolic heart failure (HCC)   Nonischemic cardiomyopathy (HCC)   Atrial fibrillation (HCC)     Respiratory   CAP (community acquired pneumonia)     Endocrine   Diabetes mellitus (HCC)       Imaging: No results found.

## 2016-03-13 NOTE — Progress Notes (Signed)
Cardiology Office Note   Date:  03/13/2016   ID:  Leslie Mantisancy A Johnsen, DOB 10-Jan-1936, MRN [id number]  PCP:  Fredirick MaudlinHAWKINS,EDWARD L, MD  Cardiologist: McDowell/  Joni ReiningKathryn Jahan Friedlander, NP   No chief complaint on file.     History of Present Illness: Leslie Kim is a 80 y.o. female who presents for ongoing assessment and management of atrial fibrillation with RVR, hypertension, and history also to include diabetes and COPD. The patient was last seen in the office in July of 2017 and continued to have complaints of dyspnea he was also having trouble avoiding salty foods. She was advised on taking extra doses of Lasix if she gained 3-5 pounds.  Most recent echocardiogram revealed an EF of 30-35%. The patient was started on Entresto, 2624 mg twice a day samples were provided.she did not tolerate this and she was returned to losartan. She is here for followup.follow up evaluation by phone concerning restarting losartan found that she was feeling much better off of the prior medication and back on her losartan.  She is without any complaints today. Doing well without dyspnea lower extremity edema or chest pain.  Past Medical History:  Diagnosis Date  . Essential hypertension   . New onset atrial fibrillation (HCC) 11/2015   a. s/p DCCV on 12/18/2015 b. started on Eliquis  . Nonischemic cardiomyopathy (HCC)    a. 11/2015: echo showing EF of 30-35%, cath with nonobstructive dz  . Type 2 diabetes mellitus (HCC)     Past Surgical History:  Procedure Laterality Date  . CARDIAC CATHETERIZATION N/A 12/23/2015   Procedure: Right/Left Heart Cath and Coronary Angiography;  Surgeon: Laurey Moralealton S McLean, MD;  [facility];  Service: Cardiovascular;  Laterality: N/A;  . CHOLECYSTECTOMY       Current Outpatient Prescriptions  Medication Sig Dispense Refill  . acetaminophen (TYLENOL) 500 MG tablet Take 1,000 mg by mouth every 6 (six) hours as needed. Pain    . aspirin EC 81 MG tablet Take 81 mg by  mouth daily.    . carvedilol (COREG) 6.25 MG tablet Take 1 tablet (6.25 mg total) by mouth 2 (two) times daily with a meal. 60 tablet 6  . Cranberry 500 MG CAPS Take 1 capsule by mouth daily.    Marland Kitchen. ELIQUIS 5 MG TABS tablet TAKE 1 TABLET BY MOUTH TWICE DAILY 60 tablet 6  . esomeprazole (NEXIUM) 40 MG capsule Take 40 mg by mouth daily.    . furosemide (LASIX) 40 MG tablet Take 1 tablet (40 mg total) by mouth daily. 30 tablet 6  . losartan (COZAAR) 25 MG tablet Take 1 tablet (25 mg total) by mouth daily. 90 tablet 3  . sitaGLIPtan-metformin (JANUMET) 50-1000 MG per tablet Take 1 tablet by mouth 2 (two) times daily.    . vitamin B-12 (CYANOCOBALAMIN) 1000 MCG tablet Take 1,000 mcg by mouth daily.     No current facility-administered medications for this visit.     Allergies:   Codeine    Social History:  The patient  reports that she quit smoking about 8 years ago. Her smoking use included Cigarettes. She does not have any smokeless tobacco history on file. She reports that she does not drink alcohol or use drugs.   Family History:  The patient's family history includes Cancer in her father; Diabetes in her brother, brother, brother, brother, brother, father, and mother.    ROS: All other systems are reviewed and negative. Unless otherwise mentioned in H&P  PHYSICAL EXAM: VS:  BP (!) 148/68   Pulse 89   Ht 5\' 3"  (1.6 m)   Wt 166 lb (75.3 kg)   SpO2 94%   BMI 29.41 kg/m  , BMI Body mass index is 29.41 kg/m. GEN: Well nourished, well developed, in no acute distress  HEENT: normal  Neck: no JVD, carotid bruits, or masses Cardiac: RRR; no murmurs, rubs, or gallops,no edema  Respiratory:  clear to auscultation bilaterally, normal work of breathing GI: soft, nontender, nondistended, + BS MS: no deformity or atrophy  Skin: warm and dry, no rash Neuro:  Strength and sensation are intact Psych: euthymic mood, full affect   Recent Labs: 12/18/2015: B Natriuretic Peptide 661.0; TSH  3.456 12/20/2015: Magnesium 1.6 12/24/2015: Hemoglobin 10.4; Platelets 274 01/21/2016: BUN 16; Creat 0.92; Potassium 3.9; Sodium 139    Lipid Panel    Component Value Date/Time   CHOL 165 12/21/2015 0458   TRIG 97 12/21/2015 0458   HDL 49 12/21/2015 0458   CHOLHDL 3.4 12/21/2015 0458   VLDL 19 12/21/2015 0458   LDLCALC 97 12/21/2015 0458      Wt Readings from Last 3 Encounters:  03/13/16 166 lb (75.3 kg)  02/15/16 167 lb (75.8 kg)  02/07/16 167 lb (75.8 kg)      ASSESSMENT AND PLAN:  1. Atrial fibrillation:well, heart rate is well controlled, she is asymptomatic. Remains on carvedilol 6.25 mg twice a day. She is tolerating ELIQUIS 5 mg twice a day without excessive bruising. She had lab work done yesterday and we're awaiting the results. No changes in her medication regimen.  2. Nonischemic arthropathy with reduced systolic function: patient has no evidence of fluid overload. She was unable to tolerate Entresto. She continues on losartan, beta blocker, and Lasix 40 mg daily. Labs were drawn yesterday. Blood pressure is slightly elevated. We'll repeat echocardiogram in 2 months.will increase carvedilol to 12.5 mg twice a day.   Current medicines are reviewed at length with the patient today.    Labs/ tests ordered today include: echocardiogram October 2017  Orders Placed This Encounter  Procedures  . ECHOCARDIOGRAM COMPLETE     Disposition:   FU with 3 months Signed, Joni Reining, NP  03/13/2016 3:23 PM    [facility] [address], [place], [place] [address] Phone: [phone]; Fax: [phone]

## 2016-03-13 NOTE — Patient Instructions (Signed)
Your physician recommends that you schedule a follow-up appointment in: 3 Months with Dr. McDowell  Your physician recommends that you continue on your current medications as directed. Please refer to the Current Medication list given to you today.  Your physician has requested that you have an echocardiogram. Echocardiography is a painless test that uses sound waves to create images of your heart. It provides your doctor with information about the size and shape of your heart and how well your heart's chambers and valves are working. This procedure takes approximately one hour. There are no restrictions for this procedure.  If you need a refill on your cardiac medications before your next appointment, please call your pharmacy.  Thank you for choosing [facility]!    

## 2016-03-14 ENCOUNTER — Telehealth: Payer: Self-pay | Admitting: *Deleted

## 2016-03-14 DIAGNOSIS — E119 Type 2 diabetes mellitus without complications: Secondary | ICD-10-CM | POA: Diagnosis not present

## 2016-03-14 DIAGNOSIS — Z79899 Other long term (current) drug therapy: Secondary | ICD-10-CM

## 2016-03-14 DIAGNOSIS — Z961 Presence of intraocular lens: Secondary | ICD-10-CM | POA: Diagnosis not present

## 2016-03-14 LAB — BASIC METABOLIC PANEL
BUN: 16 mg/dL (ref 7–25)
CHLORIDE: 100 mmol/L (ref 98–110)
CO2: 28 mmol/L (ref 20–31)
Calcium: 9 mg/dL (ref 8.6–10.4)
Creat: 0.9 mg/dL (ref 0.60–0.93)
Glucose, Bld: 96 mg/dL (ref 65–99)
Potassium: 3.6 mmol/L (ref 3.5–5.3)
Sodium: 137 mmol/L (ref 135–146)

## 2016-03-14 MED ORDER — MAGNESIUM OXIDE -MG SUPPLEMENT 200 MG PO TABS: 200.0000 mg | ORAL_TABLET | Freq: Every day | ORAL | 11 refills | Status: DC

## 2016-03-14 NOTE — Telephone Encounter (Signed)
Orders placed.

## 2016-03-14 NOTE — Telephone Encounter (Signed)
-----   Message from Jodelle Gross, NP sent at 03/14/2016  9:52 AM EDT ----- Have her begin magnesium 200 mg daily. This will help potassium as she is on Nexium, which can decrease magnesium and potassium. Follow up BMET in 2 weeks.   Thanks!

## 2016-03-28 DIAGNOSIS — Z79899 Other long term (current) drug therapy: Secondary | ICD-10-CM | POA: Diagnosis not present

## 2016-03-29 LAB — BASIC METABOLIC PANEL
BUN: 14 mg/dL (ref 7–25)
CHLORIDE: 99 mmol/L (ref 98–110)
CO2: 26 mmol/L (ref 20–31)
Calcium: 9.3 mg/dL (ref 8.6–10.4)
Creat: 1.08 mg/dL — ABNORMAL HIGH (ref 0.60–0.93)
Glucose, Bld: 113 mg/dL — ABNORMAL HIGH (ref 65–99)
POTASSIUM: 4.4 mmol/L (ref 3.5–5.3)
Sodium: 137 mmol/L (ref 135–146)

## 2016-05-15 ENCOUNTER — Ambulatory Visit (HOSPITAL_COMMUNITY)
Admission: RE | Admit: 2016-05-15 | Discharge: 2016-05-15 | Disposition: A | Discharge status: Home / Self Care | Payer: Medicare Other | Source: Ambulatory Visit | Attending: Adult Health | Admitting: Adult Health

## 2016-05-15 DIAGNOSIS — I501 Left ventricular failure: Secondary | ICD-10-CM | POA: Diagnosis not present

## 2016-05-15 DIAGNOSIS — I348 Other nonrheumatic mitral valve disorders: Secondary | ICD-10-CM | POA: Insufficient documentation

## 2016-05-15 DIAGNOSIS — I429 Cardiomyopathy, unspecified: Secondary | ICD-10-CM | POA: Diagnosis not present

## 2016-05-15 NOTE — Progress Notes (Signed)
*  PRELIMINARY RESULTS* Echocardiogram 2D Echocardiogram has been performed.  Stacey Drain 05/15/2016, 11:18 AM

## 2016-06-15 NOTE — Progress Notes (Signed)
Cardiology Office Note  Date: 06/16/2016   ID: Leslie Kim, DOB [birth year], MRN [id number]  PCP: Fredirick MaudlinHAWKINS,EDWARD L, MD  Primary Cardiologist: Nona DellSamuel McDowell, MD   Chief Complaint  Patient presents with  . Cardiomyopathy    History of Present Illness: Leslie Mantisancy A Sandford is a 80 y.o. female that I saw as an inpatient consult back in May of this year. Most recent office visit was with Ms. Lawrence NP in August. She presents for a routine follow-up visit, states that she has been doing well. Currently has NYHA class II dyspnea. No chest pain with exertion. Her weight has been stable and she has had no significant leg edema or orthopnea.  She has a history of nonischemic cardiomyopathy, follow-up echocardiogram in October is outlined below showing LVEF 35-40%. She continues on Coreg, Cozaar, and Lasix. Did not tolerate Entresto when tried originally.  She has had no palpitations or obvious recurrent atrial fibrillation. She continues on Eliquis without bleeding problems. I did ask her to stop aspirin.  Past Medical History:  Diagnosis Date  . Essential hypertension   . New onset atrial fibrillation (HCC) 11/2015   a. s/p DCCV on 12/18/2015 b. started on Eliquis  . Nonischemic cardiomyopathy (HCC)    a. 11/2015: echo showing EF of 30-35%, cath with nonobstructive dz  . Type 2 diabetes mellitus (HCC)     Past Surgical History:  Procedure Laterality Date  . CARDIAC CATHETERIZATION N/A 12/23/2015   Procedure: Right/Left Heart Cath and Coronary Angiography;  Surgeon: Laurey Moralealton S McLean, MD;  [facility];  Service: Cardiovascular;  Laterality: N/A;  . CHOLECYSTECTOMY      Current Outpatient Prescriptions  Medication Sig Dispense Refill  . acetaminophen (TYLENOL) 500 MG tablet Take 1,000 mg by mouth every 6 (six) hours as needed. Pain    . carvedilol (COREG) 6.25 MG tablet Take 1 tablet (6.25 mg total) by mouth 2 (two) times daily with a meal. 60 tablet 6  . Cranberry 500  MG CAPS Take 1 capsule by mouth daily.    Marland Kitchen. ELIQUIS 5 MG TABS tablet TAKE 1 TABLET BY MOUTH TWICE DAILY 60 tablet 6  . esomeprazole (NEXIUM) 40 MG capsule Take 40 mg by mouth daily.    . furosemide (LASIX) 40 MG tablet Take 1 tablet (40 mg total) by mouth daily. 30 tablet 6  . Magnesium Oxide 200 MG TABS Take 1 tablet (200 mg total) by mouth daily. 30 each 11  . sitaGLIPtan-metformin (JANUMET) 50-1000 MG per tablet Take 1 tablet by mouth 2 (two) times daily.    . vitamin B-12 (CYANOCOBALAMIN) 1000 MCG tablet Take 1,000 mcg by mouth daily.    Marland Kitchen. losartan (COZAAR) 50 MG tablet Take 1 tablet (50 mg total) by mouth daily. 90 tablet 3   No current facility-administered medications for this visit.    Allergies:  Codeine   Social History: The patient  reports that she quit smoking about 8 years ago. Her smoking use included Cigarettes. She has never used smokeless tobacco. She reports that she does not drink alcohol or use drugs.   ROS:  Please see the history of present illness. Otherwise, complete review of systems is positive for none.  All other systems are reviewed and negative.   Physical Exam: VS:  BP 136/70   Pulse 88   Ht 5\' 4"  (1.626 m)   Wt 165 lb 3.2 oz (74.9 kg)   SpO2 94%   BMI 28.36 kg/m , BMI Body mass  index is 28.36 kg/m.  Wt Readings from Last 3 Encounters:  06/16/16 165 lb 3.2 oz (74.9 kg)  03/13/16 166 lb (75.3 kg)  02/15/16 167 lb (75.8 kg)    General: Patient appears comfortable at rest. HEENT: Conjunctiva and lids normal, oropharynx clear. Neck: Supple, no elevated JVP or carotid bruits, no thyromegaly. Lungs: Clear to auscultation, nonlabored breathing at rest. Cardiac: Regular rate and rhythm, no S3 or significant systolic murmur, no pericardial rub. Abdomen: Soft, nontender, bowel sounds present. Extremities: No pitting edema, distal pulses 2+. Skin: Warm and dry. Musculoskeletal: No kyphosis. Neuropsychiatric: Alert and oriented x3, affect grossly  appropriate.  ECG:  Recent Labwork: 12/18/2015: B Natriuretic Peptide 661.0; TSH 3.456 12/20/2015: Magnesium 1.6 12/24/2015: Hemoglobin 10.4; Platelets 274 03/28/2016: BUN 14; Creat 1.08; Potassium 4.4; Sodium 137     Component Value Date/Time   CHOL 165 12/21/2015 0458   TRIG 97 12/21/2015 0458   HDL 49 12/21/2015 0458   CHOLHDL 3.4 12/21/2015 0458   VLDL 19 12/21/2015 0458   LDLCALC 97 12/21/2015 0458    Other Studies Reviewed Today:  Echocardiogram 05/15/2016: Study Conclusions  - Left ventricle: The cavity size was normal. Wall thickness was   increased in a pattern of mild posterior wall LVH. Systolic   function was moderately reduced. The estimated ejection fraction   was in the range of 35% to 40%. Diffuse hypokinesis. Doppler   parameters are consistent with abnormal left ventricular   relaxation (grade 1 diastolic dysfunction). Doppler parameters   are consistent with high ventricular filling pressure. - Ventricular septum: Septal motion showed abnormal function and   dyssynergy. These changes are consistent with intraventricular   conduction delay. - Mitral valve: Severely calcified annulus. Mildly calcified   leaflets .  Assessment and Plan:  1. History of atrial fibrillation status post cardioversion back in May of this year. She has been maintaining sinus rhythm and continues on Ekiquis with CHADSVASC score of 5.  2. Nonischemic cardiomyopathy, LVEF 35-40% range by most recent assessment. Continue Coreg, increase Cozaar to 50 mg daily, continue Lasix.  3. Essential hypertension, systolic blood pressure in the 130s.  4. Type 2 diabetes mellitus, followed by Dr. Juanetta Gosling.  Current medicines were reviewed with the patient today.   Disposition: Follow-up in 4 months.  Signed, Jonelle Sidle, MD, [facility] 06/16/2016 11:18 AM    [facility] at [facility] [address] [place], [place] [address] Phone: [phone]; Fax: [phone]

## 2016-06-16 ENCOUNTER — Ambulatory Visit (INDEPENDENT_AMBULATORY_CARE_PROVIDER_SITE_OTHER): Payer: Medicare Other | Admitting: Cardiology

## 2016-06-16 ENCOUNTER — Encounter: Payer: Self-pay | Admitting: Cardiology

## 2016-06-16 VITALS — BP 136/70 | HR 88 | Ht 64.0 in | Wt 165.2 lb

## 2016-06-16 DIAGNOSIS — Z8679 Personal history of other diseases of the circulatory system: Secondary | ICD-10-CM

## 2016-06-16 DIAGNOSIS — I10 Essential (primary) hypertension: Secondary | ICD-10-CM | POA: Diagnosis not present

## 2016-06-16 DIAGNOSIS — E119 Type 2 diabetes mellitus without complications: Secondary | ICD-10-CM

## 2016-06-16 DIAGNOSIS — I428 Other cardiomyopathies: Secondary | ICD-10-CM

## 2016-06-16 MED ORDER — LOSARTAN POTASSIUM 50 MG PO TABS: 50.0000 mg | ORAL_TABLET | Freq: Every day | ORAL | 3 refills | Status: DC

## 2016-06-16 NOTE — Patient Instructions (Signed)
Your physician recommends that you schedule a follow-up appointment in: 4 months with Dr Diona Browner     STOP Aspirin    INCREASE Cozaar to 50 mg daily       Thank you for choosing [facility] !

## 2016-07-26 ENCOUNTER — Other Ambulatory Visit: Payer: Self-pay | Admitting: Student

## 2016-08-28 ENCOUNTER — Other Ambulatory Visit: Payer: Self-pay | Admitting: *Deleted

## 2016-08-28 MED ORDER — APIXABAN 5 MG PO TABS: 5.0000 mg | ORAL_TABLET | Freq: Two times a day (BID) | ORAL | 6 refills | Status: DC

## 2016-09-25 DIAGNOSIS — I10 Essential (primary) hypertension: Secondary | ICD-10-CM | POA: Diagnosis not present

## 2016-09-25 DIAGNOSIS — K210 Gastro-esophageal reflux disease with esophagitis: Secondary | ICD-10-CM | POA: Diagnosis not present

## 2016-09-25 DIAGNOSIS — I4891 Unspecified atrial fibrillation: Secondary | ICD-10-CM | POA: Diagnosis not present

## 2016-09-25 DIAGNOSIS — J449 Chronic obstructive pulmonary disease, unspecified: Secondary | ICD-10-CM | POA: Diagnosis not present

## 2016-09-27 DIAGNOSIS — E785 Hyperlipidemia, unspecified: Secondary | ICD-10-CM | POA: Diagnosis not present

## 2016-09-27 DIAGNOSIS — K210 Gastro-esophageal reflux disease with esophagitis: Secondary | ICD-10-CM | POA: Diagnosis not present

## 2016-09-27 DIAGNOSIS — J449 Chronic obstructive pulmonary disease, unspecified: Secondary | ICD-10-CM | POA: Diagnosis not present

## 2016-09-27 DIAGNOSIS — I4891 Unspecified atrial fibrillation: Secondary | ICD-10-CM | POA: Diagnosis not present

## 2016-09-27 DIAGNOSIS — I10 Essential (primary) hypertension: Secondary | ICD-10-CM | POA: Diagnosis not present

## 2016-10-19 NOTE — Progress Notes (Signed)
Cardiology Office Note  Date: 10/23/2016   ID: JILLIEN LOUWAGIE, DOB February 26, 1936, MRN [id number]  PCP: Fredirick Maudlin, MD  Primary Cardiologist: Nona Dell, MD   Chief Complaint  Patient presents with  . Cardiomyopathy  . Atrial Fibrillation    History of Present Illness: Leslie Kim is a 81 y.o. female last seen in November 2017. She presents for a routine follow-up visit. States that she has noted some increased shortness of breath at times. No significant orthopnea or leg swelling. Her weight is up a few pounds. Today we talked about increasing Lasix dose based on weight gain of 2-3 pounds in 24 hours or 5 pounds in a week.  I reviewed her present cardiac regimen which is outlined below. She did not tolerate Entresto previously and remains on Cozaar. We will plan to further up titrate the dose. She does not check her blood pressure at home. Also discussed salt restriction.  I personally reviewed her ECG today which shows sinus rhythm with left bundle-branch block.  She reports recent evaluation with Dr. Juanetta Gosling and had llab work obtained, requesting the results.  Past Medical History:  Diagnosis Date  . Essential hypertension   . New onset atrial fibrillation (HCC) 11/2015   a. s/p DCCV on 12/18/2015 b. started on Eliquis  . Nonischemic cardiomyopathy (HCC)    a. 11/2015: echo showing EF of 30-35%, cath with nonobstructive dz  . Type 2 diabetes mellitus (HCC)     Past Surgical History:  Procedure Laterality Date  . CARDIAC CATHETERIZATION N/A 12/23/2015   Procedure: Right/Left Heart Cath and Coronary Angiography;  Surgeon: Laurey Morale, MD;  Location: [facility] INVASIVE CV LAB;  Service: Cardiovascular;  Laterality: N/A;  . CHOLECYSTECTOMY      Current Outpatient Prescriptions  Medication Sig Dispense Refill  . acetaminophen (TYLENOL) 500 MG tablet Take 1,000 mg by mouth every 6 (six) hours as needed. Pain    . apixaban (ELIQUIS) 5 MG TABS tablet Take 1 tablet (5 mg  total) by mouth 2 (two) times daily. 60 tablet 6  . carvedilol (COREG) 6.25 MG tablet TAKE 1 TABLET BY MOUTH TWICE DAILY WITH MEAL 180 tablet 1  . Cranberry 500 MG CAPS Take 1 capsule by mouth daily.    . furosemide (LASIX) 40 MG tablet TAKE 1 TABLET BY MOUTH DAILY 90 tablet 1  . Magnesium Oxide 200 MG TABS Take 1 tablet (200 mg total) by mouth daily. 30 each 11  . sitaGLIPtan-metformin (JANUMET) 50-1000 MG per tablet Take 1 tablet by mouth 2 (two) times daily.    . vitamin B-12 (CYANOCOBALAMIN) 1000 MCG tablet Take 1,000 mcg by mouth daily.     No current facility-administered medications for this visit.    Allergies:  Codeine   Social History: The patient  reports that she quit smoking about 8 years ago. Her smoking use included Cigarettes. She has never used smokeless tobacco. She reports that she does not drink alcohol or use drugs.   ROS:  Please see the history of present illness. Otherwise, complete review of systems is positive for none.  All other systems are reviewed and negative.   Physical Exam: VS:  BP (!) 148/62   Pulse 88   Ht 5\' 3"  (1.6 m)   Wt 168 lb (76.2 kg)   SpO2 96%   BMI 29.76 kg/m , BMI Body mass index is 29.76 kg/m.  Wt Readings from Last 3 Encounters:  10/23/16 168 lb (76.2 kg)  06/16/16 165 lb  3.2 oz (74.9 kg)  03/13/16 166 lb (75.3 kg)    General: Patient appears comfortable at rest. HEENT: Conjunctiva and lids normal, oropharynx clear. Neck: Supple, no elevated JVP or carotid bruits, no thyromegaly. Lungs: Decreased breath sounds at the bases, nonlabored breathing at rest. Cardiac: Regular rate and rhythm, no S3 or significant systolic murmur, no pericardial rub. Abdomen: Soft, nontender, bowel sounds present. Extremities: No pitting edema, distal pulses 2+. Skin: Warm and dry. Musculoskeletal: No kyphosis. Neuropsychiatric: Alert and oriented x3, affect grossly appropriate.  ECG: I personally reviewed the tracing from 12/19/2015 which showed  sinus rhythm with left bundle branch block.  Recent Labwork: 12/18/2015: B Natriuretic Peptide 661.0; TSH 3.456 12/20/2015: Magnesium 1.6 12/24/2015: Hemoglobin 10.4; Platelets 274 03/28/2016: BUN 14; Creat 1.08; Potassium 4.4; Sodium 137     Component Value Date/Time   CHOL 165 12/21/2015 0458   TRIG 97 12/21/2015 0458   HDL 49 12/21/2015 0458   CHOLHDL 3.4 12/21/2015 0458   VLDL 19 12/21/2015 0458   LDLCALC 97 12/21/2015 0458    Other Studies Reviewed Today:  Echocardiogram 05/15/2016: Study Conclusions  - Left ventricle: The cavity size was normal. Wall thickness was increased in a pattern of mild posterior wall LVH. Systolic function was moderately reduced. The estimated ejection fraction was in the range of 35% to 40%. Diffuse hypokinesis. Doppler parameters are consistent with abnormal left ventricular relaxation (grade 1 diastolic dysfunction). Doppler parameters are consistent with high ventricular filling pressure. - Ventricular septum: Septal motion showed abnormal function and dyssynergy. These changes are consistent with intraventricular conduction delay. - Mitral valve: Severely calcified annulus. Mildly calcified leaflets .  Assessment and Plan:  1. Nonischemic cardiomyopathy, LVEF 35-40% range by most recent echocardiogram. Plan to further up-titrate Cozaar to 75 mg daily, continue baseline regimen otherwise. She did not tolerate Entresto when tried previously. We will obtain a follow-up echocardiogram prior to her next visit in 3 months.  2. Essential hypertension, blood pressure not optimally controlled today. Cozaar dose being increased. Requesting most recent lab work from Dr. Juanetta Gosling.  3. History of atrial fibrillation status post cardioversion. Remains in sinus rhythm without palpitations. Continues on Eliquis for stroke prophylaxis.  4. Type 2 diabetes mellitus, remains on Janumet with follow-up per Dr. Juanetta Gosling.  Current medicines were  reviewed with the patient today.   Orders Placed This Encounter  Procedures  . EKG 12-Lead  . ECHOCARDIOGRAM COMPLETE    Disposition: Follow-up in 3 months.  Signed, Jonelle Sidle, MD, [facility] 10/23/2016 10:20 AM    [facility] at [facility] [address], [place], [place] [address] Phone: [phone]; Fax: [phone]

## 2016-10-23 ENCOUNTER — Encounter: Payer: Self-pay | Admitting: Cardiology

## 2016-10-23 ENCOUNTER — Ambulatory Visit (INDEPENDENT_AMBULATORY_CARE_PROVIDER_SITE_OTHER): Payer: Medicare Other | Admitting: Cardiology

## 2016-10-23 VITALS — BP 148/62 | HR 88 | Ht 63.0 in | Wt 168.0 lb

## 2016-10-23 DIAGNOSIS — E119 Type 2 diabetes mellitus without complications: Secondary | ICD-10-CM | POA: Diagnosis not present

## 2016-10-23 DIAGNOSIS — I10 Essential (primary) hypertension: Secondary | ICD-10-CM | POA: Diagnosis not present

## 2016-10-23 DIAGNOSIS — I428 Other cardiomyopathies: Secondary | ICD-10-CM | POA: Diagnosis not present

## 2016-10-23 DIAGNOSIS — I480 Paroxysmal atrial fibrillation: Secondary | ICD-10-CM | POA: Diagnosis not present

## 2016-10-23 MED ORDER — LOSARTAN POTASSIUM 50 MG PO TABS: 75.0000 mg | ORAL_TABLET | Freq: Every day | ORAL | 3 refills | Status: DC

## 2016-10-23 NOTE — Patient Instructions (Signed)
Your physician recommends that you schedule a follow-up appointment in: 3 months Dr Diona Browner    INCREASE Cozaar to 75 mg ( 1 1/2 tablets ) per day    Thank you for choosing [facility] !

## 2016-11-09 DIAGNOSIS — I2510 Atherosclerotic heart disease of native coronary artery without angina pectoris: Secondary | ICD-10-CM | POA: Diagnosis not present

## 2016-11-09 DIAGNOSIS — I5020 Unspecified systolic (congestive) heart failure: Secondary | ICD-10-CM | POA: Diagnosis not present

## 2016-11-09 DIAGNOSIS — I4891 Unspecified atrial fibrillation: Secondary | ICD-10-CM | POA: Diagnosis not present

## 2016-11-09 DIAGNOSIS — J449 Chronic obstructive pulmonary disease, unspecified: Secondary | ICD-10-CM | POA: Diagnosis not present

## 2016-11-16 DIAGNOSIS — I4891 Unspecified atrial fibrillation: Secondary | ICD-10-CM | POA: Diagnosis not present

## 2016-11-16 DIAGNOSIS — J449 Chronic obstructive pulmonary disease, unspecified: Secondary | ICD-10-CM | POA: Diagnosis not present

## 2016-11-16 DIAGNOSIS — I25810 Atherosclerosis of coronary artery bypass graft(s) without angina pectoris: Secondary | ICD-10-CM | POA: Diagnosis not present

## 2016-11-16 DIAGNOSIS — I5022 Chronic systolic (congestive) heart failure: Secondary | ICD-10-CM | POA: Diagnosis not present

## 2016-12-27 DIAGNOSIS — J449 Chronic obstructive pulmonary disease, unspecified: Secondary | ICD-10-CM | POA: Diagnosis not present

## 2016-12-27 DIAGNOSIS — I4891 Unspecified atrial fibrillation: Secondary | ICD-10-CM | POA: Diagnosis not present

## 2016-12-27 DIAGNOSIS — E1121 Type 2 diabetes mellitus with diabetic nephropathy: Secondary | ICD-10-CM | POA: Diagnosis not present

## 2016-12-27 DIAGNOSIS — I10 Essential (primary) hypertension: Secondary | ICD-10-CM | POA: Diagnosis not present

## 2017-01-08 ENCOUNTER — Ambulatory Visit (HOSPITAL_COMMUNITY)
Admission: RE | Admit: 2017-01-08 | Discharge: 2017-01-08 | Disposition: A | Discharge status: Home / Self Care | Payer: Medicare Other | Source: Ambulatory Visit | Attending: Cardiology | Admitting: Cardiology

## 2017-01-08 DIAGNOSIS — Z87891 Personal history of nicotine dependence: Secondary | ICD-10-CM | POA: Insufficient documentation

## 2017-01-08 DIAGNOSIS — I429 Cardiomyopathy, unspecified: Secondary | ICD-10-CM | POA: Diagnosis present

## 2017-01-08 DIAGNOSIS — I119 Hypertensive heart disease without heart failure: Secondary | ICD-10-CM | POA: Diagnosis not present

## 2017-01-08 DIAGNOSIS — I4891 Unspecified atrial fibrillation: Secondary | ICD-10-CM | POA: Diagnosis not present

## 2017-01-08 DIAGNOSIS — I081 Rheumatic disorders of both mitral and tricuspid valves: Secondary | ICD-10-CM | POA: Diagnosis not present

## 2017-01-08 DIAGNOSIS — E119 Type 2 diabetes mellitus without complications: Secondary | ICD-10-CM | POA: Diagnosis not present

## 2017-01-08 DIAGNOSIS — I428 Other cardiomyopathies: Secondary | ICD-10-CM | POA: Diagnosis not present

## 2017-01-08 NOTE — Progress Notes (Signed)
*  PRELIMINARY RESULTS* Echocardiogram 2D Echocardiogram has been performed.  Leslie Kim 01/08/2017, 12:00 PM

## 2017-01-26 ENCOUNTER — Other Ambulatory Visit: Payer: Self-pay | Admitting: Student

## 2017-02-05 ENCOUNTER — Encounter: Payer: Self-pay | Admitting: Cardiology

## 2017-02-05 ENCOUNTER — Ambulatory Visit (INDEPENDENT_AMBULATORY_CARE_PROVIDER_SITE_OTHER): Payer: Medicare Other | Admitting: Cardiology

## 2017-02-05 VITALS — BP 122/68 | HR 86 | Ht 64.0 in | Wt 171.0 lb

## 2017-02-05 DIAGNOSIS — I480 Paroxysmal atrial fibrillation: Secondary | ICD-10-CM

## 2017-02-05 DIAGNOSIS — I10 Essential (primary) hypertension: Secondary | ICD-10-CM | POA: Diagnosis not present

## 2017-02-05 DIAGNOSIS — I428 Other cardiomyopathies: Secondary | ICD-10-CM

## 2017-02-05 DIAGNOSIS — E119 Type 2 diabetes mellitus without complications: Secondary | ICD-10-CM

## 2017-02-05 NOTE — Patient Instructions (Signed)
Your physician wants you to follow-up in:6 months with Dr.McDowell You will receive a reminder letter in the mail two months in advance. If you don't receive a letter, please call our office to schedule the follow-up appointment.   Your physician recommends that you continue on your current medications as directed. Please refer to the Current Medication list given to you today.   If you need a refill on your cardiac medications before your next appointment, please call your pharmacy.    No lab work or tests ordered today.      Thank you for choosing [facility] !         

## 2017-02-05 NOTE — Progress Notes (Signed)
Cardiology Office Note  Date: 02/05/2017   ID: GWYNDA Kim, DOB [birth year], MRN [id number]  PCP: Kari Baars, MD  Primary Cardiologist: Nona Dell, MD   Chief Complaint  Patient presents with  . Cardiomyopathy    History of Present Illness: Leslie Kim is a 81 y.o. female last seen in March. She presents for a routine follow-up visit. Reports stable dyspnea on exertion, no palpitations or chest pain.  She did not tolerate Entresto, we have titrated Cozaar. Blood pressure is better controlled today. She states that she has not required any additional Lasix, her weight is up by only a few pounds. She has no leg edema at present.  I reviewed her lab work from February.  She had a follow-up echocardiogram done in June which showed relatively stable LVEF in the range of 30-35%, I reviewed the images.  Past Medical History:  Diagnosis Date  . Essential hypertension   . New onset atrial fibrillation (HCC) 11/2015   a. s/p DCCV on 12/18/2015 b. started on Eliquis  . Nonischemic cardiomyopathy (HCC)    a. 11/2015: echo showing EF of 30-35%, cath with nonobstructive dz  . Type 2 diabetes mellitus (HCC)     Past Surgical History:  Procedure Laterality Date  . CARDIAC CATHETERIZATION N/A 12/23/2015   Procedure: Right/Left Heart Cath and Coronary Angiography;  Surgeon: Laurey Morale, MD;  Location: [facility] INVASIVE CV LAB;  Service: Cardiovascular;  Laterality: N/A;  . CHOLECYSTECTOMY      Current Outpatient Prescriptions  Medication Sig Dispense Refill  . acetaminophen (TYLENOL) 500 MG tablet Take 1,000 mg by mouth every 6 (six) hours as needed. Pain    . apixaban (ELIQUIS) 5 MG TABS tablet Take 1 tablet (5 mg total) by mouth 2 (two) times daily. 60 tablet 6  . carvedilol (COREG) 6.25 MG tablet TAKE 1 TABLET BY MOUTH TWICE DAILY WITH MEAL 180 tablet 3  . Cranberry 500 MG CAPS Take 1 capsule by mouth daily.    . furosemide (LASIX) 40 MG tablet TAKE 1 TABLET BY MOUTH DAILY  90 tablet 3  . Glycopyrrolate-Formoterol (BEVESPI AEROSPHERE) 9-4.8 MCG/ACT AERO Inhale 9 mcg into the lungs 2 (two) times daily.    Marland Kitchen losartan (COZAAR) 50 MG tablet Take 1.5 tablets (75 mg total) by mouth daily. 135 tablet 3  . Magnesium Oxide 200 MG TABS Take 1 tablet (200 mg total) by mouth daily. 30 each 11  . sitaGLIPtan-metformin (JANUMET) 50-1000 MG per tablet Take 1 tablet by mouth 2 (two) times daily.    . vitamin B-12 (CYANOCOBALAMIN) 1000 MCG tablet Take 1,000 mcg by mouth daily.     No current facility-administered medications for this visit.    Allergies:  Codeine   Social History: The patient  reports that she quit smoking about 9 years ago. Her smoking use included Cigarettes. She has never used smokeless tobacco. She reports that she does not drink alcohol or use drugs.   ROS:  Please see the history of present illness. Otherwise, complete review of systems is positive for recent rash on her legs.  All other systems are reviewed and negative.   Physical Exam: VS:  BP 122/68   Pulse 86   Ht 5\' 4"  (1.626 m)   Wt 171 lb (77.6 kg)   SpO2 96%   BMI 29.35 kg/m , BMI Body mass index is 29.35 kg/m.  Wt Readings from Last 3 Encounters:  02/05/17 171 lb (77.6 kg)  10/23/16 168 lb (76.2 kg)  06/16/16 165 lb 3.2 oz (74.9 kg)    General: Patient appears comfortable at rest. HEENT: Conjunctiva and lids normal, oropharynx clear. Neck: Supple, no elevated JVP or carotid bruits, no thyromegaly. Lungs: Decreased breath sounds at the bases, nonlabored breathing at rest. Cardiac: Regular rate and rhythm, no S3 or significant systolic murmur, no pericardial rub. Abdomen: Soft, nontender, bowel sounds present. Extremities: No pitting edema, distal pulses 2+. Skin: Warm and dry. Maculopapular rash on her lower legs. Musculoskeletal: No kyphosis. Neuropsychiatric: Alert and oriented x3, affect grossly appropriate.  ECG: I personally reviewed the tracing from 10/23/2016 which showed  sinus rhythm with left bundle branch block and PVCs.  Recent Labwork: February 2018: BUN 15, creatinine 0.86, potassium 4.2, AST 20, ALT 12, cholesterol 212, triglycerides 165, HDL 59, LDL 120, hemoglobin A1c 6.7, TSH 3.94  Other Studies Reviewed Today:  Echocardiogram 01/08/2017: Study Conclusions  - Procedure narrative: Transthoracic echocardiography. Image   quality was suboptimal. - Left ventricle: The cavity size was normal. Wall thickness was   increased in a pattern of moderate LVH. Systolic function was   severely reduced. The estimated ejection fraction was 30%.   Diffuse hypokinesis. Doppler parameters are consistent with   abnormal left ventricular relaxation (grade 1 diastolic   dysfunction). Doppler parameters are consistent with high   ventricular filling pressure. - Ventricular septum: Septal motion showed abnormal function and   dyssynergy. These changes are consistent with a left bundle   branch block. - Mitral valve: Severely calcified annulus. Normal thickness   leaflets . Valve area by pressure half-time: 2.32 cm^2. - Tricuspid valve: There was mild regurgitation. - Pulmonary arteries: PA peak pressure: 37 mm Hg (S).  Assessment and Plan:  1. Nonischemic cardiomyopathy with LVEF 30-35% range. Plan to continue medical therapy, no adjustments made today with good blood pressure. Might be able to increase Coreg next.  2. Essential hypertension, blood pressure is better controlled today.  3. Atrial fibrillation status post cardioversion. She continues on Eliquis, has follow-up lab work pending with Dr. Juanetta Gosling. No bleeding problems. No palpitations.  4. Type 2 diabetes mellitus, recent hemoglobin A1c 6.7. Keep follow-up with Dr. Juanetta Gosling.  Current medicines were reviewed with the patient today.  Disposition: Follow-up in 6 months.  Signed, Jonelle Sidle, MD, [facility] 02/05/2017 10:09 AM    [facility] at [facility] [address]  [place], [place] [address] Phone: [phone]; Fax: [phone]

## 2017-02-22 DIAGNOSIS — I4891 Unspecified atrial fibrillation: Secondary | ICD-10-CM | POA: Diagnosis not present

## 2017-02-22 DIAGNOSIS — I25810 Atherosclerosis of coronary artery bypass graft(s) without angina pectoris: Secondary | ICD-10-CM | POA: Diagnosis not present

## 2017-02-22 DIAGNOSIS — J449 Chronic obstructive pulmonary disease, unspecified: Secondary | ICD-10-CM | POA: Diagnosis not present

## 2017-02-22 DIAGNOSIS — I5022 Chronic systolic (congestive) heart failure: Secondary | ICD-10-CM | POA: Diagnosis not present

## 2017-03-19 DIAGNOSIS — J449 Chronic obstructive pulmonary disease, unspecified: Secondary | ICD-10-CM | POA: Diagnosis not present

## 2017-03-19 DIAGNOSIS — I10 Essential (primary) hypertension: Secondary | ICD-10-CM | POA: Diagnosis not present

## 2017-03-19 DIAGNOSIS — E1121 Type 2 diabetes mellitus with diabetic nephropathy: Secondary | ICD-10-CM | POA: Diagnosis not present

## 2017-03-19 DIAGNOSIS — I4891 Unspecified atrial fibrillation: Secondary | ICD-10-CM | POA: Diagnosis not present

## 2017-03-20 DIAGNOSIS — H524 Presbyopia: Secondary | ICD-10-CM | POA: Diagnosis not present

## 2017-03-20 DIAGNOSIS — Z961 Presence of intraocular lens: Secondary | ICD-10-CM | POA: Diagnosis not present

## 2017-03-20 DIAGNOSIS — E119 Type 2 diabetes mellitus without complications: Secondary | ICD-10-CM | POA: Diagnosis not present

## 2017-03-28 ENCOUNTER — Other Ambulatory Visit: Payer: Self-pay | Admitting: Cardiology

## 2017-05-29 DIAGNOSIS — Z23 Encounter for immunization: Secondary | ICD-10-CM | POA: Diagnosis not present

## 2017-05-29 DIAGNOSIS — J449 Chronic obstructive pulmonary disease, unspecified: Secondary | ICD-10-CM | POA: Diagnosis not present

## 2017-05-29 DIAGNOSIS — I10 Essential (primary) hypertension: Secondary | ICD-10-CM | POA: Diagnosis not present

## 2017-05-29 DIAGNOSIS — I25119 Atherosclerotic heart disease of native coronary artery with unspecified angina pectoris: Secondary | ICD-10-CM | POA: Diagnosis not present

## 2017-05-29 DIAGNOSIS — K210 Gastro-esophageal reflux disease with esophagitis: Secondary | ICD-10-CM | POA: Diagnosis not present

## 2017-08-27 DIAGNOSIS — J449 Chronic obstructive pulmonary disease, unspecified: Secondary | ICD-10-CM | POA: Diagnosis not present

## 2017-08-27 DIAGNOSIS — I4891 Unspecified atrial fibrillation: Secondary | ICD-10-CM | POA: Diagnosis not present

## 2017-08-27 DIAGNOSIS — I5022 Chronic systolic (congestive) heart failure: Secondary | ICD-10-CM | POA: Diagnosis not present

## 2017-08-27 DIAGNOSIS — I25810 Atherosclerosis of coronary artery bypass graft(s) without angina pectoris: Secondary | ICD-10-CM | POA: Diagnosis not present

## 2017-08-27 DIAGNOSIS — I10 Essential (primary) hypertension: Secondary | ICD-10-CM | POA: Diagnosis not present

## 2017-09-07 DIAGNOSIS — I10 Essential (primary) hypertension: Secondary | ICD-10-CM | POA: Diagnosis not present

## 2017-09-07 DIAGNOSIS — I5022 Chronic systolic (congestive) heart failure: Secondary | ICD-10-CM | POA: Diagnosis not present

## 2017-09-07 DIAGNOSIS — J441 Chronic obstructive pulmonary disease with (acute) exacerbation: Secondary | ICD-10-CM | POA: Diagnosis not present

## 2017-09-07 DIAGNOSIS — I2510 Atherosclerotic heart disease of native coronary artery without angina pectoris: Secondary | ICD-10-CM | POA: Diagnosis not present

## 2017-09-21 DIAGNOSIS — I2510 Atherosclerotic heart disease of native coronary artery without angina pectoris: Secondary | ICD-10-CM | POA: Diagnosis not present

## 2017-09-21 DIAGNOSIS — E1121 Type 2 diabetes mellitus with diabetic nephropathy: Secondary | ICD-10-CM | POA: Diagnosis not present

## 2017-09-21 DIAGNOSIS — J449 Chronic obstructive pulmonary disease, unspecified: Secondary | ICD-10-CM | POA: Diagnosis not present

## 2017-09-21 DIAGNOSIS — I5022 Chronic systolic (congestive) heart failure: Secondary | ICD-10-CM | POA: Diagnosis not present

## 2017-09-25 NOTE — Progress Notes (Signed)
Cardiology Office Note  Date: 09/26/2017   ID: Leslie Kim, DOB 1936/03/19, MRN [id number]  PCP: Kari Baars, MD  Primary Cardiologist: Nona Dell, MD   Chief Complaint  Patient presents with  . Cardiomyopathy    History of Present Illness: Leslie Kim is a 82 y.o. female last seen in July 2018.  She is here for a routine follow-up visit.  Since last encounter she states that she had a worsening spell of shortness of breath, it sounds like she was treated with antibiotics and steroids, but then ultimately had her Lasix increased to 60 mg daily by Dr. Juanetta Gosling.  She has had no palpitations and heart rate is regular today.  We went over her medications.  Current cardiac regimen includes Coreg, Eliquis, Lasix, and Cozaar.  She is currently not on potassium supplements.  She did not tolerate Entresto when we tried this originally.  Last echocardiogram in June 2018 revealed LVEF approximately 30% with diffuse hypokinesis.  She has a nonischemic cardiomyopathy with mild nonobstructive coronary atherosclerosis by cardiac catheterization in 2017.  Past Medical History:  Diagnosis Date  . Essential hypertension   . New onset atrial fibrillation (HCC) 11/2015   a. s/p DCCV on 12/18/2015 b. started on Eliquis  . Nonischemic cardiomyopathy (HCC)    a. 11/2015: echo showing EF of 30-35%, cath with nonobstructive dz  . Type 2 diabetes mellitus (HCC)     Past Surgical History:  Procedure Laterality Date  . CARDIAC CATHETERIZATION N/A 12/23/2015   Procedure: Right/Left Heart Cath and Coronary Angiography;  Surgeon: Laurey Morale, MD;  Location: [facility] INVASIVE CV LAB;  Service: Cardiovascular;  Laterality: N/A;  . CHOLECYSTECTOMY      Current Outpatient Medications  Medication Sig Dispense Refill  . acetaminophen (TYLENOL) 500 MG tablet Take 1,000 mg by mouth every 6 (six) hours as needed. Pain    . b complex vitamins tablet Take 1 tablet by mouth every other day.    .  carvedilol (COREG) 6.25 MG tablet TAKE 1 TABLET BY MOUTH TWICE DAILY WITH MEAL 180 tablet 3  . Cranberry 500 MG CAPS Take 1 capsule by mouth daily.    Marland Kitchen ELIQUIS 5 MG TABS tablet TAKE 1 TABLET(5 MG) BY MOUTH TWICE DAILY 60 tablet 11  . esomeprazole (NEXIUM) 20 MG capsule Take 20 mg by mouth daily at 12 noon.    . furosemide (LASIX) 40 MG tablet TAKE 1 TABLET BY MOUTH DAILY (Patient taking differently: PT IS TAKING 60 MG OF LASIX DAILY) 90 tablet 3  . Glycopyrrolate-Formoterol (BEVESPI AEROSPHERE) 9-4.8 MCG/ACT AERO Inhale 9 mcg into the lungs 2 (two) times daily.    . Magnesium 250 MG TABS Take 1 tablet by mouth daily.    Marland Kitchen PROAIR HFA 108 (90 Base) MCG/ACT inhaler Inhale 2 puffs into the lungs 4 (four) times daily as needed.  12  . sitaGLIPtan-metformin (JANUMET) 50-1000 MG per tablet Take 1 tablet by mouth 2 (two) times daily.    . TRELEGY ELLIPTA 100-62.5-25 MCG/INH AEPB Inhale 1 puff into the lungs daily.  5  . vitamin B-12 (CYANOCOBALAMIN) 1000 MCG tablet Take 1,500 mcg by mouth every other day.     . losartan (COZAAR) 100 MG tablet Take 1 tablet (100 mg total) by mouth daily. 90 tablet 3   No current facility-administered medications for this visit.    Allergies:  Codeine   Social History: The patient  reports that she quit smoking about 9 years ago. Her smoking use  included cigarettes. she has never used smokeless tobacco. She reports that she does not drink alcohol or use drugs.   ROS:  Please see the history of present illness. Otherwise, complete review of systems is positive for NYHA class II dyspnea, no palpitations or syncope.  All other systems are reviewed and negative.   Physical Exam: VS:  BP (!) 150/80   Pulse 79   Ht  (1.626 m)   Wt 176 lb 3.2 oz (79.9 kg)   SpO2 97% Comment: on room air  BMI 30.24 kg/m , BMI Body mass index is 30.24 kg/m.  Wt Readings from Last 3 Encounters:  09/26/17 176 lb 3.2 oz (79.9 kg)  02/05/17 171 lb (77.6 kg)  10/23/16 168 lb (76.2  kg)    General: Patient appears comfortable at rest. HEENT: Conjunctiva and lids normal, oropharynx clear. Neck: Supple, no elevated JVP or carotid bruits, no thyromegaly. Lungs: No wheezing or rhonchi, nonlabored breathing at rest. Cardiac: Regular rate and rhythm, no S3 or significant systolic murmur. Abdomen: Soft, nontender, bowel sounds present. Extremities: No pitting edema, distal pulses 2+. Skin: Warm and dry. Musculoskeletal: No kyphosis. Neuropsychiatric: Alert and oriented x3, affect grossly appropriate.   ECG: I personally reviewed the tracing from 10/23/2016 which showed sinus rhythm with PVC and left bundle branch block.  Recent Labwork:  February 2018: BUN 15, creatinine 0.86, potassium 4.2, AST 20, ALT 12, cholesterol 212, triglycerides 165, HDL 59, LDL 120, hemoglobin A1c 6.7, TSH 3.94  Other Studies Reviewed Today:  Echocardiogram 01/08/2017: Study Conclusions  - Procedure narrative: Transthoracic echocardiography. Image   quality was suboptimal. - Left ventricle: The cavity size was normal. Wall thickness was   increased in a pattern of moderate LVH. Systolic function was   severely reduced. The estimated ejection fraction was 30%.   Diffuse hypokinesis. Doppler parameters are consistent with   abnormal left ventricular relaxation (grade 1 diastolic   dysfunction). Doppler parameters are consistent with high   ventricular filling pressure. - Ventricular septum: Septal motion showed abnormal function and   dyssynergy. These changes are consistent with a left bundle   branch block. - Mitral valve: Severely calcified annulus. Normal thickness   leaflets . Valve area by pressure half-time: 2.32 cm^2. - Tricuspid valve: There was mild regurgitation. - Pulmonary arteries: PA peak pressure: 37 mm Hg (S).  Assessment and Plan:  1.  Nonischemic cardiomyopathy with LVEF approximately 30% based on last assessment.  It sounds like she had an episode of volume overload  since I saw her resulting in an increase in Lasix by Dr. Juanetta Gosling.  Her weight is up somewhat.  Blood pressure also elevated.  We went over her medications, plan to increase Cozaar to 100 mg daily, follow-up BMET.  Continue with current dose of Lasix.  Unfortunately, she did not tolerate Entresto previously.  We will obtain a follow-up echocardiogram and bring her back to the office for reassessment.  May need to consider adding Aldactone next.  2.  History of atrial fibrillation status post cardioversion, maintaining sinus rhythm.  She remains on Eliquis.  Reports no bleeding problems.  3.  Essential hypertension, blood pressure is up today.  4.  Type 2 diabetes mellitus, followed by Dr. Juanetta Gosling.  Current medicines were reviewed with the patient today.   Orders Placed This Encounter  Procedures  . Basic Metabolic Panel (BMET)  . ECHOCARDIOGRAM COMPLETE    Disposition: Follow-up in the next few weeks.  Signed, Jonelle Sidle, MD, [facility] 09/26/2017 11:41  AM    [facility] at [facility] [address] [place], [place] [address] Phone: [phone]; Fax: [phone]

## 2017-09-26 ENCOUNTER — Other Ambulatory Visit: Payer: Self-pay

## 2017-09-26 ENCOUNTER — Encounter: Payer: Self-pay | Admitting: Cardiology

## 2017-09-26 ENCOUNTER — Ambulatory Visit: Payer: Medicare Other | Admitting: Cardiology

## 2017-09-26 VITALS — BP 150/80 | HR 79 | Ht 64.0 in | Wt 176.2 lb

## 2017-09-26 DIAGNOSIS — I428 Other cardiomyopathies: Secondary | ICD-10-CM | POA: Diagnosis not present

## 2017-09-26 DIAGNOSIS — E119 Type 2 diabetes mellitus without complications: Secondary | ICD-10-CM

## 2017-09-26 DIAGNOSIS — I10 Essential (primary) hypertension: Secondary | ICD-10-CM

## 2017-09-26 DIAGNOSIS — Z79899 Other long term (current) drug therapy: Secondary | ICD-10-CM | POA: Diagnosis not present

## 2017-09-26 DIAGNOSIS — I480 Paroxysmal atrial fibrillation: Secondary | ICD-10-CM | POA: Diagnosis not present

## 2017-09-26 MED ORDER — LOSARTAN POTASSIUM 100 MG PO TABS: 100.0000 mg | ORAL_TABLET | Freq: Every day | ORAL | 3 refills | Status: DC

## 2017-09-26 NOTE — Patient Instructions (Signed)
Medication Instructions:  INCREASE LOSARTAN TO 100 MG DAILY   Labwork: 1 WEEK  BMET   Testing/Procedures: Your physician has requested that you have an echocardiogram. Echocardiography is a painless test that uses sound waves to create images of your heart. It provides your doctor with information about the size and shape of your heart and how well your heart's chambers and valves are working. This procedure takes approximately one hour. There are no restrictions for this procedure.    Follow-Up: Your physician recommends that you schedule a follow-up appointment in: 3 WEEKS    Any Other Special Instructions Will Be Listed Below (If Applicable).     If you need a refill on your cardiac medications before your next appointment, please call your pharmacy.

## 2017-10-01 ENCOUNTER — Ambulatory Visit (HOSPITAL_COMMUNITY)
Admission: RE | Admit: 2017-10-01 | Discharge: 2017-10-01 | Disposition: A | Discharge status: Home / Self Care | Payer: Medicare Other | Source: Ambulatory Visit | Attending: Cardiology | Admitting: Cardiology

## 2017-10-01 DIAGNOSIS — I059 Rheumatic mitral valve disease, unspecified: Secondary | ICD-10-CM | POA: Diagnosis not present

## 2017-10-01 DIAGNOSIS — I428 Other cardiomyopathies: Secondary | ICD-10-CM | POA: Diagnosis not present

## 2017-10-01 DIAGNOSIS — E119 Type 2 diabetes mellitus without complications: Secondary | ICD-10-CM | POA: Diagnosis not present

## 2017-10-01 DIAGNOSIS — I4891 Unspecified atrial fibrillation: Secondary | ICD-10-CM | POA: Diagnosis not present

## 2017-10-01 DIAGNOSIS — I119 Hypertensive heart disease without heart failure: Secondary | ICD-10-CM | POA: Insufficient documentation

## 2017-10-01 LAB — ECHOCARDIOGRAM COMPLETE
AVLVOTPG: 6 mmHg
CHL CUP STROKE VOLUME: 40 mL
E/e' ratio: 15.09
EWDT: 261 ms
FS: 18 % — AB (ref 28–44)
IVS/LV PW RATIO, ED: 0.98
LA diam index: 1.66 cm/m2
LA vol A4C: 56.5 ml
LA vol: 57.2 mL
LASIZE: 32 mm
LAVOLIN: 29.7 mL/m2
LEFT ATRIUM END SYS DIAM: 32 mm
LV E/e' medial: 15.09
LV E/e'average: 15.09
LV PW d: 13.2 mm — AB (ref 0.6–1.1)
LV SIMPSON'S DISK: 50
LV TDI E'MEDIAL: 5.11
LV dias vol: 80 mL (ref 46–106)
LVDIAVOLIN: 41 mL/m2
LVELAT: 7.29 cm/s
LVOT SV: 71 mL
LVOT VTI: 24.9 cm
LVOT area: 2.84 cm2
LVOT peak vel: 124 cm/s
LVOTD: 19 mm
LVSYSVOL: 40 mL
LVSYSVOLIN: 21 mL/m2
Lateral S' vel: 13.2 cm/s
MV Dec: 261
MV pk A vel: 141 m/s
MVPG: 5 mmHg
MVPKEVEL: 110 m/s
RV sys press: 35 mmHg
Reg peak vel: 282 cm/s
TAPSE: 21.3 mm
TDI e' lateral: 7.29
TRMAXVEL: 282 cm/s

## 2017-10-01 NOTE — Progress Notes (Signed)
*  PRELIMINARY RESULTS* Echocardiogram 2D Echocardiogram has been performed.  Stacey Drain 10/01/2017, 12:28 PM

## 2017-10-04 ENCOUNTER — Other Ambulatory Visit: Payer: Self-pay | Admitting: Cardiology

## 2017-10-04 DIAGNOSIS — Z79899 Other long term (current) drug therapy: Secondary | ICD-10-CM | POA: Diagnosis not present

## 2017-10-05 LAB — BASIC METABOLIC PANEL
BUN / CREAT RATIO: 13 (ref 12–28)
BUN: 14 mg/dL (ref 8–27)
CO2: 24 mmol/L (ref 20–29)
CREATININE: 1.09 mg/dL — AB (ref 0.57–1.00)
Calcium: 9.7 mg/dL (ref 8.7–10.3)
Chloride: 98 mmol/L (ref 96–106)
GFR calc Af Amer: 56 mL/min/{1.73_m2} — ABNORMAL LOW (ref >59)
GFR calc non Af Amer: 48 mL/min/{1.73_m2} — ABNORMAL LOW (ref >59)
Glucose: 146 mg/dL — ABNORMAL HIGH (ref 65–99)
Potassium: 4 mmol/L (ref 3.5–5.2)
Sodium: 139 mmol/L (ref 134–144)

## 2017-10-05 LAB — SPECIMEN STATUS REPORT

## 2017-10-17 ENCOUNTER — Ambulatory Visit: Payer: Medicare Other | Admitting: Student

## 2017-10-17 ENCOUNTER — Encounter: Payer: Self-pay | Admitting: Student

## 2017-10-17 VITALS — BP 140/64 | HR 90 | Ht 63.0 in | Wt 179.0 lb

## 2017-10-17 DIAGNOSIS — I10 Essential (primary) hypertension: Secondary | ICD-10-CM

## 2017-10-17 DIAGNOSIS — I428 Other cardiomyopathies: Secondary | ICD-10-CM

## 2017-10-17 DIAGNOSIS — I480 Paroxysmal atrial fibrillation: Secondary | ICD-10-CM

## 2017-10-17 DIAGNOSIS — I5042 Chronic combined systolic (congestive) and diastolic (congestive) heart failure: Secondary | ICD-10-CM

## 2017-10-17 DIAGNOSIS — Z7901 Long term (current) use of anticoagulants: Secondary | ICD-10-CM | POA: Diagnosis not present

## 2017-10-17 MED ORDER — LOSARTAN POTASSIUM 50 MG PO TABS: 50.0000 mg | ORAL_TABLET | Freq: Two times a day (BID) | ORAL | 3 refills | Status: DC

## 2017-10-17 NOTE — Patient Instructions (Signed)
Medication Instructions:  Your physician has recommended you make the following change in your medication:  Take Losartan 50 mg Two Times Daily    Labwork: NONE   Testing/Procedures: NONE   Follow-Up: Your physician recommends that you schedule a follow-up appointment in: 3 Months    Any Other Special Instructions Will Be Listed Below (If Applicable).     If you need a refill on your cardiac medications before your next appointment, please call your pharmacy. Thank you for choosing [facility]!

## 2017-10-17 NOTE — Progress Notes (Signed)
Cardiology Office Note    Date:  10/17/2017   ID:  Leslie Kim, DOB [birth year], MRN [id number]  PCP:  Leslie Baars, MD  Cardiologist: Leslie Dell, MD    Chief Complaint  Patient presents with  . Follow-up    3 weeks    History of Present Illness:    Leslie Kim is a 82 y.o. female with past medical history of chronic combined systolic and diastolic CHF EF 50% in 2017, improved to 35-40% by echo in 09/2017), nonischemic cardiomyopathy (cath in 2017 showing mild non-obstructive CAD), PAF (s/p DCCV in 11/2015), HTN, and Type 2 DM who presents to the office today for 3-week follow-up.   She was recently evaluated by Dr. Diona Kim on 09/26/2017 and reported having an episode of recent worsening dyspnea which was treated with antibiotics and steroids, along with Lasix being increased to 60 mg daily by her PCP. BP was also elevated to 150/80 at the time of her visit. She was therefore continued on Lasix 60 mg daily and Losartan was increased to 100 mg daily. A repeat echocardiogram was obtained and showed her EF had improved slightly to 35-40% with diffuse hypokinesis noted.  In talking with the patient today, she reports overall doing well since her last office visit. Denies any recent dyspnea on exertion, chest pain, palpitations, PND, or edema. Does report having occasional shortness of breath at nighttime upon lying down to go to sleep.   She notes good compliance with her Lasix dosing and has remained on 40 mg in AM/20mg  in PM. Overall tries to consume a low-sodium diet but does have "cheat snacks" at time and consumes chips or popcorn but says this is very rare. Says weight has been stable at 175-176 lbs on her home scales. She did self-reduce her Losartan to 75mg  daily due to 100mg  daily making her feel fatigued.    Past Medical History:  Diagnosis Date  . Essential hypertension   . New onset atrial fibrillation (HCC) 11/2015   a. s/p DCCV on 12/18/2015 b. started on Eliquis    . Nonischemic cardiomyopathy (HCC)    a. 11/2015: echo showing EF of 30-35%, cath with nonobstructive dz  . Type 2 diabetes mellitus (HCC)     Past Surgical History:  Procedure Laterality Date  . CARDIAC CATHETERIZATION N/A 12/23/2015   Procedure: Right/Left Heart Cath and Coronary Angiography;  Surgeon: Laurey Morale, MD;  Location: [facility] INVASIVE CV LAB;  Service: Cardiovascular;  Laterality: N/A;  . CHOLECYSTECTOMY      Current Medications: Outpatient Medications Prior to Visit  Medication Sig Dispense Refill  . acetaminophen (TYLENOL) 500 MG tablet Take 1,000 mg by mouth every 6 (six) hours as needed. Pain    . b complex vitamins tablet Take 1 tablet by mouth every other day.    . carvedilol (COREG) 6.25 MG tablet TAKE 1 TABLET BY MOUTH TWICE DAILY WITH MEAL 180 tablet 3  . Cranberry 500 MG CAPS Take 1 capsule by mouth daily.    Marland Kitchen ELIQUIS 5 MG TABS tablet TAKE 1 TABLET(5 MG) BY MOUTH TWICE DAILY 60 tablet 11  . esomeprazole (NEXIUM) 20 MG capsule Take 20 mg by mouth daily at 12 noon.    . furosemide (LASIX) 40 MG tablet Take 40 mg by mouth. Take 40 mg ( 1 Tablet)  in the AM and Take 20 mg( 1/2 Tablet) in the PM    . Glycopyrrolate-Formoterol (BEVESPI AEROSPHERE) 9-4.8 MCG/ACT AERO Inhale 9 mcg into the lungs 2 (  two) times daily.    . Magnesium 250 MG TABS Take 1 tablet by mouth daily.    Marland Kitchen PROAIR HFA 108 (90 Base) MCG/ACT inhaler Inhale 2 puffs into the lungs 4 (four) times daily as needed.  12  . sitaGLIPtan-metformin (JANUMET) 50-1000 MG per tablet Take 1 tablet by mouth 2 (two) times daily.    . TRELEGY ELLIPTA 100-62.5-25 MCG/INH AEPB Inhale 1 puff into the lungs daily.  5  . vitamin B-12 (CYANOCOBALAMIN) 1000 MCG tablet Take 1,500 mcg by mouth every other day.     . furosemide (LASIX) 40 MG tablet TAKE 1 TABLET BY MOUTH DAILY (Patient taking differently: PT IS TAKING 60 MG OF LASIX DAILY) 90 tablet 3  . losartan (COZAAR) 100 MG tablet Take 1 tablet (100 mg total) by mouth daily.  (Patient not taking: Reported on 10/17/2017) 90 tablet 3   No facility-administered medications prior to visit.      Allergies:   Codeine   Social History   Socioeconomic History  . Marital status: Married    Spouse name: None  . Number of children: None  . Years of education: None  . Highest education level: None  Social Needs  . Financial resource strain: None  . Food insecurity - worry: None  . Food insecurity - inability: None  . Transportation needs - medical: None  . Transportation needs - non-medical: None  Occupational History  . None  Tobacco Use  . Smoking status: Former Smoker    Types: Cigarettes    Last attempt to quit: 12/18/2007    Years since quitting: 9.8  . Smokeless tobacco: Never Used  . Tobacco comment: Quit for 8 years  Substance and Sexual Activity  . Alcohol use: No    Alcohol/week: 0.0 oz  . Drug use: No  . Sexual activity: Yes  Other Topics Concern  . None  Social History Narrative  . None     Family History:  The patient's family history includes Cancer in her father; Diabetes in her brother, brother, brother, brother, brother, father, and mother.   Review of Systems:   Please see the history of present illness.     General:  No chills, fever, night sweats or weight changes.  Cardiovascular:  No chest pain, dyspnea on exertion, edema, palpitations, paroxysmal nocturnal dyspnea. Positive for orthopnea.  Dermatological: No rash, lesions/masses Respiratory: No cough, dyspnea Urologic: No hematuria, dysuria Abdominal:   No nausea, vomiting, diarrhea, bright red blood per rectum, melena, or hematemesis Neurologic:  No visual changes, wkns, changes in mental status. All other systems reviewed and are otherwise negative except as noted above.   Physical Exam:    VS:  BP 140/64 (BP Location: Left Arm)   Pulse 90   Ht 5\' 3"  (1.6 m)   Wt 179 lb (81.2 kg)   SpO2 95%   BMI 31.71 kg/m    General: Well developed, well nourished Caucasian  female appearing in no acute distress. Head: Normocephalic, atraumatic, sclera non-icteric, no xanthomas, nares are without discharge.  Neck: No carotid bruits. JVD not elevated.  Lungs: Respirations regular and unlabored, without wheezes or rales.  Heart: Regular rate and rhythm. No S3 or S4.  No murmur, no rubs, or gallops appreciated. Abdomen: Soft, non-tender, non-distended with normoactive bowel sounds. No hepatomegaly. No rebound/guarding. No obvious abdominal masses. Msk:  Strength and tone appear normal for age. No joint deformities or effusions. Extremities: No clubbing or cyanosis. No lower extremity edema.  Distal pedal pulses are  2+ bilaterally. Neuro: Alert and oriented X 3. Moves all extremities spontaneously. No focal deficits noted. Psych:  Responds to questions appropriately with a normal affect. Skin: No rashes or lesions noted  Wt Readings from Last 3 Encounters:  10/17/17 179 lb (81.2 kg)  09/26/17 176 lb 3.2 oz (79.9 kg)  02/05/17 171 lb (77.6 kg)      Studies/Labs Reviewed:   EKG:  EKG is ordered today.  The ekg ordered today demonstrates NSR, HR 78, with incomplete LBBB.   Recent Labs: 10/04/2017: BUN 14; Creatinine, Ser 1.09; Potassium 4.0; Sodium 139   Lipid Panel    Component Value Date/Time   CHOL 165 12/21/2015 0458   TRIG 97 12/21/2015 0458   HDL 49 12/21/2015 0458   CHOLHDL 3.4 12/21/2015 0458   VLDL 19 12/21/2015 0458   LDLCALC 97 12/21/2015 0458    Additional studies/ records that were reviewed today include:   Echocardiogram: 10/01/2017 Study Conclusions  - Left ventricle: The cavity size was normal. Wall thickness was   increased in a pattern of moderate LVH. Systolic function was   moderately reduced. The estimated ejection fraction was in the   range of 35% to 40%. Diffuse hypokinesis. Doppler parameters are   consistent with abnormal left ventricular relaxation (grade 1   diastolic dysfunction). Doppler parameters are consistent with    high ventricular filling pressure. - Aortic valve: Valve area (VTI): 2.84 cm^2. Valve area (Vmax):   2.69 cm^2. Valve area (Vmean): 2.27 cm^2. - Mitral valve: Moderately to severely calcified annulus. Normal   thickness leaflets . - Left atrium: The atrium was mildly dilated. - Pulmonary arteries: PA peak pressure: 35 mm Hg (S). PASP is   borderline elevated. - Technically difficult study.  Assessment:    1. Chronic combined systolic and diastolic heart failure (HCC)   2. Nonischemic cardiomyopathy (HCC)   3. Paroxysmal atrial fibrillation (HCC)   4. Current use of long term anticoagulation   5. Essential hypertension      Plan:   In order of problems listed above:  1. Chronic Combined Systolic and Diastolic CHF/ Nonischemic Cardiomyopathy - the patient has a known reduced EF 30% in 2017 with cath at that time showing nonobstructive CAD. A repeat echo was just recently obtained and showed her EF had improved slightly to 35-40%.  - she denies any recent dyspnea on exertion, chest pain, palpitations, PND, or edema. Does have occasional orthopnea. Weight has increased by 3 lbs on the office scales since her last visit but she notes this has been stable at 175 - 176 lbs on her home scales. Would continue on Lasix 40 mg in AM/20mg  in PM. She may take an additional  tablet for edema or weight gain > 3 lbs overnight. - Losartan was recently titrated at her last visit and labs showed her kidney function and K+ levels remained stable. However, within the past week she has reduced this back to  in AM and  in PM due to feeling fatigued when taking this once daily. With her cardiomyopathy and elevated BP, I recommended she try taking  in AM and  in PM. If unable to tolerate higher dosing, may need to consider adjustment of Coreg to 12.5mg  BID or the addition of Spironolactone. She is very hesitant to be on additional medications, therefore will focus on titration of her current  medications at this time.   2. Paroxysmal Atrial Fibrillation/ Use of Long-term Anticoagulation - s/p DCCV in 11/2015. She denies any recent  palpitations and is maintaining NSR at today's visit.  - will plan to continue Coreg 6.25mg  BID for rate-control.  - she denies any evidence of active bleeding. Remains on Eliquis for anticoagulation.   3. HTN - BP is slightly elevated at 140/64 during today's visit.  - will plan for titration of Losartan as outlined above. Continue Coreg at current dosing.    Medication Adjustments/Labs and Tests Ordered: Current medicines are reviewed at length with the patient today.  Concerns regarding medicines are outlined above.  Medication changes, Labs and Tests ordered today are listed in the Patient Instructions below. Patient Instructions  Medication Instructions:  Your physician has recommended you make the following change in your medication:  Take Losartan 50 mg Two Times Daily    Labwork: NONE   Testing/Procedures: NONE   Follow-Up: Your physician recommends that you schedule a follow-up appointment in: 3 Months   Any Other Special Instructions Will Be Listed Below (If Applicable).  If you need a refill on your cardiac medications before your next appointment, please call your pharmacy. Thank you for choosing [facility]!     Signed, Ellsworth Lennox, PA-C  10/17/2017 8:42 PM    [facility] [address] [place], [place] [address] Phone: [phone]

## 2017-11-06 ENCOUNTER — Telehealth: Payer: Self-pay | Admitting: Student

## 2017-11-06 NOTE — Telephone Encounter (Signed)
By review of notes, her weight is usually around 175 - 176 lbs on her home scales. With the weight gain and associated symptoms, would recommend we increase her Lasix until weight is back to baseline. Has she tried taking any additional Lasix? If not, would recommend she increase from 40 mg in AM/20mg  in PM to 40mg  BID. If no change in her symptoms or weight within two days, would take 60mg  in AM/40mg  in PM for 5 days then call back with her weight.  Signed, Ellsworth Lennox, PA-C 11/06/2017, 10:10 AM Pager: [phone]

## 2017-11-06 NOTE — Telephone Encounter (Signed)
Patient has had 1 week of leg swelling and having to sleep in recliner as she cannot lye supine due to SOB.her home weight is 182 lbs.She is elevating legs and watching sodium intake

## 2017-11-06 NOTE — Telephone Encounter (Signed)
Patient is calling in regards to her feet being swollen. / tg

## 2017-11-06 NOTE — Telephone Encounter (Signed)
Patient had not taken any extra Lasix.She will increase Lasix to 40 mg BID for 2 days, if no improvement, she will increase Lasix to 60 mg am, and 40 mg pm.She will keep in touch with updates

## 2017-11-26 DIAGNOSIS — I5023 Acute on chronic systolic (congestive) heart failure: Secondary | ICD-10-CM | POA: Diagnosis not present

## 2017-11-26 DIAGNOSIS — J449 Chronic obstructive pulmonary disease, unspecified: Secondary | ICD-10-CM | POA: Diagnosis not present

## 2017-11-26 DIAGNOSIS — E1165 Type 2 diabetes mellitus with hyperglycemia: Secondary | ICD-10-CM | POA: Diagnosis not present

## 2017-11-26 DIAGNOSIS — I10 Essential (primary) hypertension: Secondary | ICD-10-CM | POA: Diagnosis not present

## 2017-11-30 ENCOUNTER — Other Ambulatory Visit: Payer: Self-pay | Admitting: Cardiology

## 2017-11-30 MED ORDER — FUROSEMIDE 40 MG PO TABS: 40.0000 mg | ORAL_TABLET | Freq: Two times a day (BID) | ORAL | 6 refills | Status: DC

## 2017-11-30 NOTE — Telephone Encounter (Signed)
Needs refill on Furosemide sent to [facility] / tg

## 2017-11-30 NOTE — Telephone Encounter (Signed)
refilled lasix by request

## 2017-12-10 IMAGING — CR DG CHEST 1V PORT
1 series · 1 of 1 positions shown · non-contrast
Comparison: None.

CLINICAL DATA: Chest pain

EXAM:
PORTABLE CHEST 1 VIEW

[ap portable]
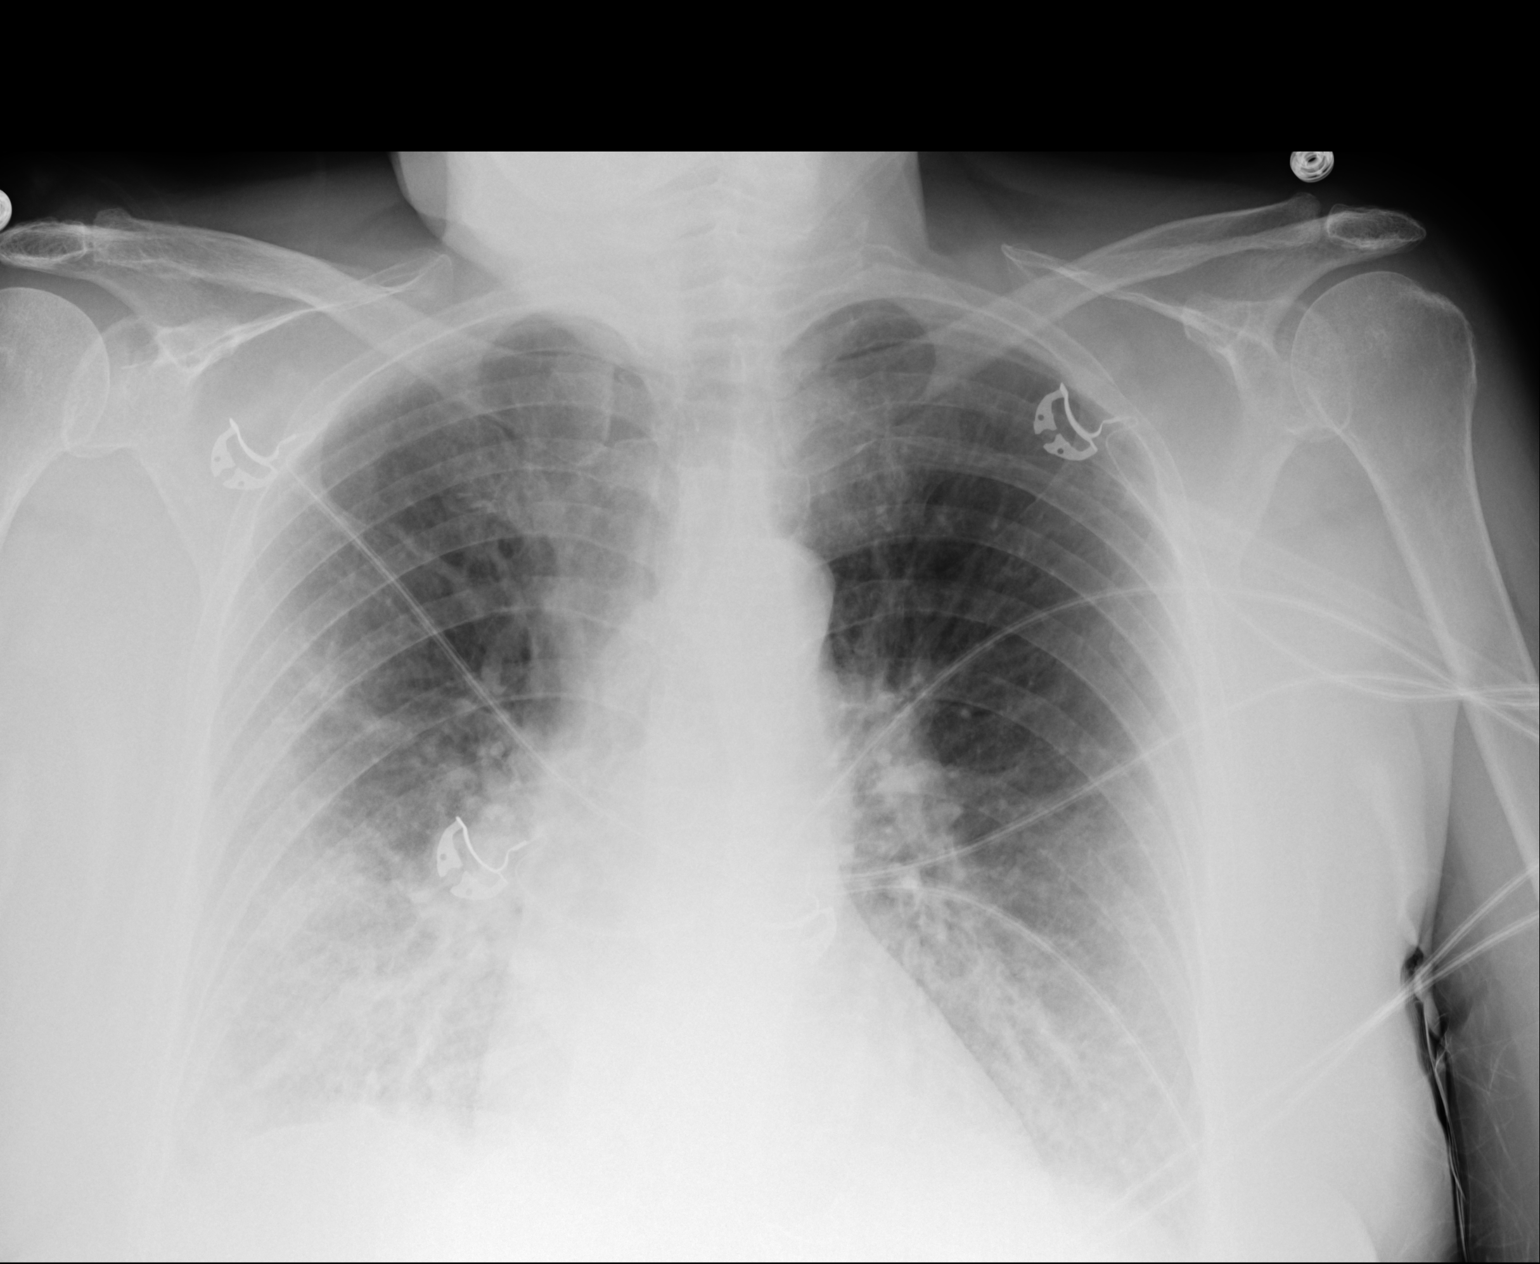

[1 of 1 positions shown; findings below may reference images not displayed]

FINDINGS: Mild focal/patchy opacity in the right upper lobe. Additional patchy
opacities in the bilateral lower lobes. Overall, this appearance is
considered suspicious for multifocal pneumonia.

No definite pleural effusions.  No pneumothorax.

Heart is normal in size.
IMPRESSION: Mild patchy opacities in the right upper lobe and bilateral lower
lobes, suspicious for pneumonia.

## 2017-12-27 DIAGNOSIS — I4891 Unspecified atrial fibrillation: Secondary | ICD-10-CM | POA: Diagnosis not present

## 2017-12-27 DIAGNOSIS — J449 Chronic obstructive pulmonary disease, unspecified: Secondary | ICD-10-CM | POA: Diagnosis not present

## 2017-12-27 DIAGNOSIS — I5023 Acute on chronic systolic (congestive) heart failure: Secondary | ICD-10-CM | POA: Diagnosis not present

## 2018-01-21 NOTE — Progress Notes (Signed)
Cardiology Office Note  Date: 01/22/2018   ID: Leslie Kim, DOB 12-30-1935, MRN [id number]  PCP: Kari Baars, MD  Primary Cardiologist: Nona Dell, MD   Chief Complaint  Patient presents with  . Cardiomyopathy    History of Present Illness: Leslie Kim is a 82 y.o. female last seen by Ms. Strader PA-C in March.  She presents for a routine visit.  Since last evaluation Lasix dose has been increased by Dr. Juanetta Gosling.  She still has shortness of breath mainly during the a.m. hours, gets better during the afternoon.  No current leg swelling.  She does not report any palpitations or chest pain.  I reviewed her current medications.  She reports compliance.  In the past she did not tolerate Entresto but has been able to take adequate doses of losartan.  I talked with her today about considering the addition of Aldactone but I would like to see her follow-up lab work in terms of renal function and potassium first.  Echocardiogram from March showed LVEF 35 to 40% range with diffuse hypokinesis and mild diastolic dysfunction.  Past Medical History:  Diagnosis Date  . Essential hypertension   . New onset atrial fibrillation (HCC) 11/2015   a. s/p DCCV on 12/18/2015 b. started on Eliquis  . Nonischemic cardiomyopathy (HCC)    a. 11/2015: echo showing EF of 30-35%, cath with nonobstructive dz  . Type 2 diabetes mellitus (HCC)     Past Surgical History:  Procedure Laterality Date  . CARDIAC CATHETERIZATION N/A 12/23/2015   Procedure: Right/Left Heart Cath and Coronary Angiography;  Surgeon: Laurey Morale, MD;  Location: [facility] INVASIVE CV LAB;  Service: Cardiovascular;  Laterality: N/A;  . CHOLECYSTECTOMY      Current Outpatient Medications  Medication Sig Dispense Refill  . acetaminophen (TYLENOL) 500 MG tablet Take 1,000 mg by mouth every 6 (six) hours as needed. Pain    . b complex vitamins tablet Take 1 tablet by mouth every other day.    . carvedilol (COREG) 6.25 MG tablet  TAKE 1 TABLET BY MOUTH TWICE DAILY WITH MEAL 180 tablet 3  . Cranberry 500 MG CAPS Take 1 capsule by mouth daily.    Marland Kitchen ELIQUIS 5 MG TABS tablet TAKE 1 TABLET(5 MG) BY MOUTH TWICE DAILY 60 tablet 11  . esomeprazole (NEXIUM) 20 MG capsule Take 20 mg by mouth daily at 12 noon.    . furosemide (LASIX) 40 MG tablet Take 1 tablet (40 mg total) by mouth 2 (two) times daily. Take 40 mg ( 1 Tablet)  in the AM and Take 20 mg( 1/2 Tablet) in the PM 30 tablet 6  . Glycopyrrolate-Formoterol (BEVESPI AEROSPHERE) 9-4.8 MCG/ACT AERO Inhale 9 mcg into the lungs 2 (two) times daily.    Marland Kitchen losartan (COZAAR) 50 MG tablet Take 50 mg by mouth 2 (two) times daily.    . Magnesium 250 MG TABS Take 1 tablet by mouth daily.    Marland Kitchen PROAIR HFA 108 (90 Base) MCG/ACT inhaler Inhale 2 puffs into the lungs 4 (four) times daily as needed.  12  . sitaGLIPtan-metformin (JANUMET) 50-1000 MG per tablet Take 1 tablet by mouth 2 (two) times daily.    . TRELEGY ELLIPTA 100-62.5-25 MCG/INH AEPB Inhale 1 puff into the lungs daily.  5  . vitamin B-12 (CYANOCOBALAMIN) 1000 MCG tablet Take 1,500 mcg by mouth every other day.      No current facility-administered medications for this visit.    Allergies:  Codeine  Social History: The patient  reports that she quit smoking about 10 years ago. Her smoking use included cigarettes. She has never used smokeless tobacco. She reports that she does not drink alcohol or use drugs.   ROS:  Please see the history of present illness. Otherwise, complete review of systems is positive for none.  All other systems are reviewed and negative.   Physical Exam: VS:  BP 140/70   Pulse 83   Ht 5\' 4"  (1.626 m)   Wt 174 lb (78.9 kg)   SpO2 96%   BMI 29.87 kg/m , BMI Body mass index is 29.87 kg/m.  Wt Readings from Last 3 Encounters:  01/22/18 174 lb (78.9 kg)  10/17/17 179 lb (81.2 kg)  09/26/17 176 lb 3.2 oz (79.9 kg)    General: Patient appears comfortable at rest. HEENT: Conjunctiva and lids  normal, oropharynx clear. Neck: Supple, no elevated JVP or carotid bruits, no thyromegaly. Lungs: Clear to auscultation, nonlabored breathing at rest. Cardiac: Regular rate and rhythm, no S3 or significant systolic murmur, no pericardial rub. Abdomen: Soft, nontender, bowel sounds present. Extremities: No pitting edema, distal pulses 2+. Skin: Warm and dry. Musculoskeletal: No kyphosis. Neuropsychiatric: Alert and oriented x3, affect grossly appropriate.  ECG: I personally reviewed the tracing from 10/17/2017 which showed sinus rhythm with incomplete left bundle branch block.  Recent Labwork: 10/04/2017: BUN 14; Creatinine, Ser 1.09; Potassium 4.0; Sodium 139     Component Value Date/Time   CHOL 165 12/21/2015 0458   TRIG 97 12/21/2015 0458   HDL 49 12/21/2015 0458   CHOLHDL 3.4 12/21/2015 0458   VLDL 19 12/21/2015 0458   LDLCALC 97 12/21/2015 0458    Other Studies Reviewed Today:  Echocardiogram 10/01/2017: Study Conclusions  - Left ventricle: The cavity size was normal. Wall thickness was   increased in a pattern of moderate LVH. Systolic function was   moderately reduced. The estimated ejection fraction was in the   range of 35% to 40%. Diffuse hypokinesis. Doppler parameters are   consistent with abnormal left ventricular relaxation (grade 1   diastolic dysfunction). Doppler parameters are consistent with   high ventricular filling pressure. - Aortic valve: Valve area (VTI): 2.84 cm^2. Valve area (Vmax):   2.69 cm^2. Valve area (Vmean): 2.27 cm^2. - Mitral valve: Moderately to severely calcified annulus. Normal   thickness leaflets . - Left atrium: The atrium was mildly dilated. - Pulmonary arteries: PA peak pressure: 35 mm Hg (S). PASP is   borderline elevated. - Technically difficult study.  Assessment and Plan:  1.  Chronic combined heart failure with nonischemic cardiomyopathy and LVEF 35 to 40% range by most recent assessment.  Plan to continue current regimen, she  will follow-up on lab work already ordered by Dr. Juanetta Gosling.  Presuming renal function and potassium are stable, Aldactone will be added next.  As noted previously, she did not tolerate Entresto but is been able to stay on Cozaar.  2.  History of atrial fibrillation, tolerating Eliquis for stroke prophylaxis.  3.  Essential hypertension, no changes in present regimen made today.  4.  Type 2 diabetes mellitus, managed by Dr. Juanetta Gosling.  Current medicines were reviewed with the patient today.  Disposition: Follow-up in 3 months.  Signed, Jonelle Sidle, MD, [facility] 01/22/2018 11:45 AM    [facility] [address], [place], [place] [address] Phone: [phone]; Fax: [phone]

## 2018-01-22 ENCOUNTER — Ambulatory Visit: Payer: Medicare Other | Admitting: Cardiology

## 2018-01-22 ENCOUNTER — Encounter: Payer: Self-pay | Admitting: Cardiology

## 2018-01-22 ENCOUNTER — Other Ambulatory Visit: Payer: Self-pay

## 2018-01-22 VITALS — BP 140/70 | HR 83 | Ht 64.0 in | Wt 174.0 lb

## 2018-01-22 DIAGNOSIS — I10 Essential (primary) hypertension: Secondary | ICD-10-CM | POA: Diagnosis not present

## 2018-01-22 DIAGNOSIS — I428 Other cardiomyopathies: Secondary | ICD-10-CM

## 2018-01-22 DIAGNOSIS — E119 Type 2 diabetes mellitus without complications: Secondary | ICD-10-CM | POA: Diagnosis not present

## 2018-01-22 DIAGNOSIS — I480 Paroxysmal atrial fibrillation: Secondary | ICD-10-CM

## 2018-01-22 NOTE — Patient Instructions (Signed)
Medication Instructions:  Your physician recommends that you continue on your current medications as directed. Please refer to the Current Medication list given to you today.   Labwork: PLEASE ASK LAB TO SEND RESULTS OF YOUR LABS ONCE THEY ARE DONE  Testing/Procedures: NONE  Follow-Up: Your physician recommends that you schedule a follow-up appointment in: 3 MONTHS    Any Other Special Instructions Will Be Listed Below (If Applicable).     If you need a refill on your cardiac medications before your next appointment, please call your pharmacy.

## 2018-01-24 DIAGNOSIS — I4891 Unspecified atrial fibrillation: Secondary | ICD-10-CM | POA: Diagnosis not present

## 2018-01-24 DIAGNOSIS — J449 Chronic obstructive pulmonary disease, unspecified: Secondary | ICD-10-CM | POA: Diagnosis not present

## 2018-01-24 DIAGNOSIS — I2510 Atherosclerotic heart disease of native coronary artery without angina pectoris: Secondary | ICD-10-CM | POA: Diagnosis not present

## 2018-01-24 DIAGNOSIS — I5023 Acute on chronic systolic (congestive) heart failure: Secondary | ICD-10-CM | POA: Diagnosis not present

## 2018-01-25 ENCOUNTER — Other Ambulatory Visit: Payer: Self-pay | Admitting: Cardiology

## 2018-01-25 MED ORDER — CARVEDILOL 6.25 MG PO TABS: ORAL_TABLET | ORAL | 3 refills | Status: DC

## 2018-01-25 NOTE — Telephone Encounter (Signed)
Needs refill on Carvedilol sent to [facility] / tg

## 2018-01-25 NOTE — Telephone Encounter (Signed)
Refilled coreg.

## 2018-01-28 ENCOUNTER — Telehealth: Payer: Self-pay

## 2018-01-28 DIAGNOSIS — Z79899 Other long term (current) drug therapy: Secondary | ICD-10-CM

## 2018-01-28 MED ORDER — SPIRONOLACTONE 25 MG PO TABS: 12.5000 mg | ORAL_TABLET | Freq: Every day | ORAL | 3 refills | Status: DC

## 2018-01-28 NOTE — Telephone Encounter (Signed)
Patient agrees to start aldactone 12.5 mg daily.I have mailed lab slip

## 2018-02-04 ENCOUNTER — Telehealth: Payer: Self-pay | Admitting: Cardiology

## 2018-02-04 NOTE — Telephone Encounter (Signed)
Patient informed that her doctor is aware that she is on furosemide and aldactone and she can continue all medications as prescribed. Verbalized understanding of plan.

## 2018-02-04 NOTE — Telephone Encounter (Signed)
Wants to know if she's supposed to take her spironolactone (ALDACTONE) 25 MG tablet [[id number]]  And lasix. Please give her a call @ [phone]

## 2018-02-22 DIAGNOSIS — Z79899 Other long term (current) drug therapy: Secondary | ICD-10-CM | POA: Diagnosis not present

## 2018-02-23 LAB — BASIC METABOLIC PANEL
BUN/Creatinine Ratio: 19 (ref 12–28)
BUN: 22 mg/dL (ref 8–27)
CO2: 22 mmol/L (ref 20–29)
Calcium: 9.8 mg/dL (ref 8.7–10.3)
Chloride: 88 mmol/L — ABNORMAL LOW (ref 96–106)
Creatinine, Ser: 1.18 mg/dL — ABNORMAL HIGH (ref 0.57–1.00)
GFR calc Af Amer: 51 mL/min/{1.73_m2} — ABNORMAL LOW (ref >59)
GFR calc non Af Amer: 44 mL/min/{1.73_m2} — ABNORMAL LOW (ref >59)
Glucose: 103 mg/dL — ABNORMAL HIGH (ref 65–99)
Potassium: 4.5 mmol/L (ref 3.5–5.2)
SODIUM: 128 mmol/L — AB (ref 134–144)

## 2018-02-26 ENCOUNTER — Telehealth: Payer: Self-pay

## 2018-02-26 DIAGNOSIS — Z79899 Other long term (current) drug therapy: Secondary | ICD-10-CM

## 2018-02-26 MED ORDER — FUROSEMIDE 40 MG PO TABS: 40.0000 mg | ORAL_TABLET | Freq: Every day | ORAL | 6 refills | Status: DC

## 2018-02-26 NOTE — Telephone Encounter (Signed)
Pt notified, mailed lab slip

## 2018-02-26 NOTE — Telephone Encounter (Signed)
-----   Message from Jonelle Sidle, MD sent at 02/25/2018  8:19 AM EDT ----- Results reviewed.  Creatinine has bumped up some from 1.09-1.18.  Potassium normal.  Sodium also down to 128.  I would have her decrease Lasix to 40 mg daily and otherwise continue low-dose Aldactone 12.5 mg daily.  Follow-up BMET in 3 weeks. A copy of this test should be forwarded to Kari Baars, MD.

## 2018-03-19 ENCOUNTER — Other Ambulatory Visit: Payer: Self-pay | Admitting: Cardiology

## 2018-03-19 DIAGNOSIS — Z79899 Other long term (current) drug therapy: Secondary | ICD-10-CM | POA: Diagnosis not present

## 2018-03-20 LAB — BASIC METABOLIC PANEL
BUN / CREAT RATIO: 16 (ref 12–28)
BUN: 20 mg/dL (ref 8–27)
CHLORIDE: 94 mmol/L — AB (ref 96–106)
CO2: 21 mmol/L (ref 20–29)
Calcium: 9.8 mg/dL (ref 8.7–10.3)
Creatinine, Ser: 1.22 mg/dL — ABNORMAL HIGH (ref 0.57–1.00)
GFR calc Af Amer: 49 mL/min/{1.73_m2} — ABNORMAL LOW (ref >59)
GFR calc non Af Amer: 42 mL/min/{1.73_m2} — ABNORMAL LOW (ref >59)
GLUCOSE: 91 mg/dL (ref 65–99)
Potassium: 4.7 mmol/L (ref 3.5–5.2)
SODIUM: 132 mmol/L — AB (ref 134–144)

## 2018-03-20 LAB — SPECIMEN STATUS REPORT

## 2018-03-25 ENCOUNTER — Other Ambulatory Visit: Payer: Self-pay | Admitting: Cardiology

## 2018-03-28 DIAGNOSIS — J449 Chronic obstructive pulmonary disease, unspecified: Secondary | ICD-10-CM | POA: Diagnosis not present

## 2018-03-28 DIAGNOSIS — I5022 Chronic systolic (congestive) heart failure: Secondary | ICD-10-CM | POA: Diagnosis not present

## 2018-03-28 DIAGNOSIS — I2510 Atherosclerotic heart disease of native coronary artery without angina pectoris: Secondary | ICD-10-CM | POA: Diagnosis not present

## 2018-03-28 DIAGNOSIS — I10 Essential (primary) hypertension: Secondary | ICD-10-CM | POA: Diagnosis not present

## 2018-04-08 ENCOUNTER — Other Ambulatory Visit: Payer: Self-pay | Admitting: Cardiology

## 2018-04-24 ENCOUNTER — Encounter: Payer: Self-pay | Admitting: Cardiology

## 2018-04-24 ENCOUNTER — Ambulatory Visit: Payer: Medicare Other | Admitting: Cardiology

## 2018-04-24 VITALS — BP 136/62 | HR 84 | Ht 63.0 in | Wt 178.0 lb

## 2018-04-24 DIAGNOSIS — Z79899 Other long term (current) drug therapy: Secondary | ICD-10-CM

## 2018-04-24 DIAGNOSIS — Z8679 Personal history of other diseases of the circulatory system: Secondary | ICD-10-CM

## 2018-04-24 DIAGNOSIS — E119 Type 2 diabetes mellitus without complications: Secondary | ICD-10-CM

## 2018-04-24 DIAGNOSIS — I5042 Chronic combined systolic (congestive) and diastolic (congestive) heart failure: Secondary | ICD-10-CM

## 2018-04-24 DIAGNOSIS — I10 Essential (primary) hypertension: Secondary | ICD-10-CM

## 2018-04-24 NOTE — Progress Notes (Signed)
Cardiology Office Note  Date: 04/24/2018   ID: Leslie Kim, DOB April 21, 1936, MRN [id number]  PCP: Leslie Baars, MD  Primary Cardiologist: Leslie Dell, MD   Chief Complaint  Patient presents with  . Cardiomyopathy    History of Present Illness: Leslie Kim is a 82 y.o. female last seen in June.  She is here for a routine follow-up visit.  She reports NYHA class II-III dyspnea, no palpitations or chest pain however.  Still functional with ADLs.  She states that she has been compliant with her medications.  We have adjusted her diuretic regimen with follow-up lab work in August noted below.  She states that her weight is fluctuating only by few pounds up or down and she is taking Lasix 40 mg once daily.  As noted previously, she did not tolerate Entresto but has maintained Cozaar without side effects.  Echocardiogram from March revealed LVEF 35 to 40%.  Past Medical History:  Diagnosis Date  . Essential hypertension   . New onset atrial fibrillation (HCC) 11/2015   a. s/p DCCV on 12/18/2015 b. started on Eliquis  . Nonischemic cardiomyopathy (HCC)    a. 11/2015: echo showing EF of 30-35%, cath with nonobstructive dz  . Type 2 diabetes mellitus (HCC)     Past Surgical History:  Procedure Laterality Date  . CARDIAC CATHETERIZATION N/A 12/23/2015   Procedure: Right/Left Heart Cath and Coronary Angiography;  Surgeon: Laurey Morale, MD;  Location: [facility] INVASIVE CV LAB;  Service: Cardiovascular;  Laterality: N/A;  . CHOLECYSTECTOMY      Current Outpatient Medications  Medication Sig Dispense Refill  . acetaminophen (TYLENOL) 500 MG tablet Take 1,000 mg by mouth every 6 (six) hours as needed. Pain    . b complex vitamins tablet Take 1 tablet by mouth every other day.    . carvedilol (COREG) 6.25 MG tablet TAKE 1 TABLET BY MOUTH TWICE DAILY WITH MEAL 180 tablet 3  . Cranberry 500 MG CAPS Take 1 capsule by mouth daily.    Marland Kitchen ELIQUIS 5 MG TABS tablet TAKE 1 TABLET(5 MG) BY  MOUTH TWICE DAILY 60 tablet 11  . esomeprazole (NEXIUM) 20 MG capsule Take 20 mg by mouth daily at 12 noon.    . furosemide (LASIX) 40 MG tablet Take 40 mg by mouth daily.    . Glycopyrrolate-Formoterol (BEVESPI AEROSPHERE) 9-4.8 MCG/ACT AERO Inhale 9 mcg into the lungs 2 (two) times daily.    Marland Kitchen losartan (COZAAR) 50 MG tablet Take 50 mg by mouth 2 (two) times daily.    . Magnesium 250 MG TABS Take 1 tablet by mouth daily.    Marland Kitchen PROAIR HFA 108 (90 Base) MCG/ACT inhaler Inhale 2 puffs into the lungs 4 (four) times daily as needed.  12  . sitaGLIPtan-metformin (JANUMET) 50-1000 MG per tablet Take 1 tablet by mouth 2 (two) times daily.    Marland Kitchen spironolactone (ALDACTONE) 25 MG tablet Take 0.5 tablets (12.5 mg total) by mouth daily. 45 tablet 3  . TRELEGY ELLIPTA 100-62.5-25 MCG/INH AEPB Inhale 1 puff into the lungs daily.  5  . vitamin B-12 (CYANOCOBALAMIN) 1000 MCG tablet Take 1,500 mcg by mouth every other day.      No current facility-administered medications for this visit.    Allergies:  Codeine   Social History: The patient  reports that she quit smoking about 10 years ago. Her smoking use included cigarettes. She has never used smokeless tobacco. She reports that she does not drink alcohol or use  drugs.   ROS:  Please see the history of present illness. Otherwise, complete review of systems is positive for chronic shortness of breath.  All other systems are reviewed and negative.   Physical Exam: VS:  BP 136/62 (BP Location: Right Arm)   Pulse 84   Ht 5\' 3"  (1.6 m)   Wt 178 lb (80.7 kg)   SpO2 95%   BMI 31.53 kg/m , BMI Body mass index is 31.53 kg/m.  Wt Readings from Last 3 Encounters:  04/24/18 178 lb (80.7 kg)  01/22/18 174 lb (78.9 kg)  10/17/17 179 lb (81.2 kg)    General: Patient appears comfortable at rest. HEENT: Conjunctiva and lids normal, oropharynx clear. Neck: Supple, no elevated JVP or carotid bruits, no thyromegaly. Lungs: Clear to auscultation, nonlabored breathing  at rest. Cardiac: Regular rate and rhythm, no S3 or significant systolic murmur. Abdomen: Soft, nontender, bowel sounds present. Extremities: No pitting edema, distal pulses 2+. Skin: Warm and dry. Musculoskeletal: No kyphosis. Neuropsychiatric: Alert and oriented x3, affect grossly appropriate.  ECG: I personally reviewed the tracing from 10/17/2017 which showed sinus rhythm with incomplete left bundle branch block.  Recent Labwork: 03/19/2018: BUN 20; Creatinine, Ser 1.22; Potassium 4.7; Sodium 132     Component Value Date/Time   CHOL 165 12/21/2015 0458   TRIG 97 12/21/2015 0458   HDL 49 12/21/2015 0458   CHOLHDL 3.4 12/21/2015 0458   VLDL 19 12/21/2015 0458   LDLCALC 97 12/21/2015 0458    Other Studies Reviewed Today:  Echocardiogram 10/01/2017: Study Conclusions  - Left ventricle: The cavity size was normal. Wall thickness was   increased in a pattern of moderate LVH. Systolic function was   moderately reduced. The estimated ejection fraction was in the   range of 35% to 40%. Diffuse hypokinesis. Doppler parameters are   consistent with abnormal left ventricular relaxation (grade 1   diastolic dysfunction). Doppler parameters are consistent with   high ventricular filling pressure. - Aortic valve: Valve area (VTI): 2.84 cm^2. Valve area (Vmax):   2.69 cm^2. Valve area (Vmean): 2.27 cm^2. - Mitral valve: Moderately to severely calcified annulus. Normal   thickness leaflets . - Left atrium: The atrium was mildly dilated. - Pulmonary arteries: PA peak pressure: 35 mm Hg (S). PASP is   borderline elevated. - Technically difficult study.  Assessment and Plan:  1.  Chronic combined heart failure with nonischemic cardiomyopathy.  LVEF 35 to 40% range.  Plan is to continue current medical regimen.  Reinforced checking daily weights and sodium restriction.  We will consider a follow-up echocardiogram around the time of her next visit.  2.  History of atrial fibrillation.  She  reports no palpitations and heart rate is regular today.  She remains on Eliquis.  Follow-up CBC and BMET for next visit.  3.  Essential hypertension, blood pressure is adequately controlled today.  4.  Type 2 diabetes mellitus, keep follow-up with Dr. Juanetta Gosling.  Current medicines were reviewed with the patient today.   Orders Placed This Encounter  Procedures  . CBC  . Basic Metabolic Panel (BMET)    Disposition: Follow-up in 4 months.  Signed, Jonelle Sidle, MD, [facility] 04/24/2018 11:18 AM    [facility] at [facility] [address], [place], [place] [address] Phone: [phone]; Fax: [phone]

## 2018-04-24 NOTE — Patient Instructions (Signed)
Your physician recommends that you schedule a follow-up appointment in: 4 months with Dr. Diona Browner    Get lab work: cbc,bmet JUST BEFORE NEXT VISIT   Your physician recommends that you continue on your current medications as directed. Please refer to the Current Medication list given to you today.    If you need a refill on your cardiac medications before your next appointment, please call your pharmacy.     No testing ordered today.      Thank you for choosing [facility] !

## 2018-05-02 DIAGNOSIS — Z23 Encounter for immunization: Secondary | ICD-10-CM | POA: Diagnosis not present

## 2018-05-02 DIAGNOSIS — Z0000 Encounter for general adult medical examination without abnormal findings: Secondary | ICD-10-CM | POA: Diagnosis not present

## 2018-08-05 DIAGNOSIS — I10 Essential (primary) hypertension: Secondary | ICD-10-CM | POA: Diagnosis not present

## 2018-08-05 DIAGNOSIS — I5022 Chronic systolic (congestive) heart failure: Secondary | ICD-10-CM | POA: Diagnosis not present

## 2018-08-05 DIAGNOSIS — I4891 Unspecified atrial fibrillation: Secondary | ICD-10-CM | POA: Diagnosis not present

## 2018-08-05 DIAGNOSIS — I2510 Atherosclerotic heart disease of native coronary artery without angina pectoris: Secondary | ICD-10-CM | POA: Diagnosis not present

## 2018-08-05 DIAGNOSIS — Z23 Encounter for immunization: Secondary | ICD-10-CM | POA: Diagnosis not present

## 2018-09-10 DIAGNOSIS — K210 Gastro-esophageal reflux disease with esophagitis: Secondary | ICD-10-CM | POA: Diagnosis not present

## 2018-09-10 DIAGNOSIS — E119 Type 2 diabetes mellitus without complications: Secondary | ICD-10-CM | POA: Diagnosis not present

## 2018-09-10 DIAGNOSIS — Z0000 Encounter for general adult medical examination without abnormal findings: Secondary | ICD-10-CM | POA: Diagnosis not present

## 2018-09-10 DIAGNOSIS — J449 Chronic obstructive pulmonary disease, unspecified: Secondary | ICD-10-CM | POA: Diagnosis not present

## 2018-09-10 DIAGNOSIS — I5022 Chronic systolic (congestive) heart failure: Secondary | ICD-10-CM | POA: Diagnosis not present

## 2018-09-12 DIAGNOSIS — D649 Anemia, unspecified: Secondary | ICD-10-CM | POA: Diagnosis not present

## 2018-10-29 ENCOUNTER — Other Ambulatory Visit: Payer: Self-pay | Admitting: Cardiology

## 2018-11-05 DIAGNOSIS — J449 Chronic obstructive pulmonary disease, unspecified: Secondary | ICD-10-CM | POA: Diagnosis not present

## 2018-11-05 DIAGNOSIS — I2510 Atherosclerotic heart disease of native coronary artery without angina pectoris: Secondary | ICD-10-CM | POA: Diagnosis not present

## 2018-11-05 DIAGNOSIS — J301 Allergic rhinitis due to pollen: Secondary | ICD-10-CM | POA: Diagnosis not present

## 2018-11-08 ENCOUNTER — Other Ambulatory Visit: Payer: Self-pay

## 2018-11-08 ENCOUNTER — Other Ambulatory Visit: Payer: Self-pay | Admitting: Cardiology

## 2018-11-08 MED ORDER — FUROSEMIDE 40 MG PO TABS: 40.0000 mg | ORAL_TABLET | Freq: Every day | ORAL | 2 refills | Status: DC

## 2018-11-08 NOTE — Telephone Encounter (Signed)
Pharmacy wanted refill for lasix 60 mg, per 02/25/18 order for lasix to be reduced to 40 mg daily due to renal function. I sent refill with note to pharmacy

## 2018-11-11 ENCOUNTER — Encounter: Payer: Self-pay | Admitting: Internal Medicine

## 2018-11-12 ENCOUNTER — Other Ambulatory Visit: Payer: Self-pay | Admitting: Cardiology

## 2018-11-12 ENCOUNTER — Other Ambulatory Visit: Payer: Self-pay

## 2018-11-12 MED ORDER — LOSARTAN POTASSIUM 50 MG PO TABS: 50.0000 mg | ORAL_TABLET | Freq: Two times a day (BID) | ORAL | 3 refills | Status: DC

## 2018-11-12 NOTE — Telephone Encounter (Signed)
Refilled losartan per request °

## 2018-11-12 NOTE — Telephone Encounter (Signed)
°*  STAT* If patient is at the pharmacy, call can be transferred to refill team.   1. Which medications need to be refilled? losartan (COZAAR) 50 MG tablet    2. Which pharmacy/location (including street and city if local pharmacy) is medication to be sent to? [facility] [place]  3. Do they need a 30 day or 90 day supply?    Patient will be out of medication tomorrow

## 2018-11-28 DIAGNOSIS — Z1211 Encounter for screening for malignant neoplasm of colon: Secondary | ICD-10-CM | POA: Diagnosis not present

## 2019-01-02 DIAGNOSIS — I5022 Chronic systolic (congestive) heart failure: Secondary | ICD-10-CM | POA: Diagnosis not present

## 2019-01-02 DIAGNOSIS — I10 Essential (primary) hypertension: Secondary | ICD-10-CM | POA: Diagnosis not present

## 2019-01-02 DIAGNOSIS — I5023 Acute on chronic systolic (congestive) heart failure: Secondary | ICD-10-CM | POA: Diagnosis not present

## 2019-01-02 DIAGNOSIS — J449 Chronic obstructive pulmonary disease, unspecified: Secondary | ICD-10-CM | POA: Diagnosis not present

## 2019-01-02 DIAGNOSIS — E1121 Type 2 diabetes mellitus with diabetic nephropathy: Secondary | ICD-10-CM | POA: Diagnosis not present

## 2019-01-22 ENCOUNTER — Other Ambulatory Visit: Payer: Self-pay | Admitting: Cardiology

## 2019-01-27 ENCOUNTER — Other Ambulatory Visit: Payer: Self-pay | Admitting: Cardiology

## 2019-02-03 ENCOUNTER — Ambulatory Visit: Payer: Medicare Other | Admitting: Gastroenterology

## 2019-02-06 ENCOUNTER — Other Ambulatory Visit: Payer: Self-pay | Admitting: Cardiology

## 2019-02-21 ENCOUNTER — Other Ambulatory Visit: Payer: Self-pay | Admitting: Cardiology

## 2019-03-03 ENCOUNTER — Other Ambulatory Visit: Payer: Self-pay | Admitting: Cardiology

## 2019-03-17 ENCOUNTER — Other Ambulatory Visit: Payer: Self-pay | Admitting: Cardiology

## 2019-04-03 ENCOUNTER — Ambulatory Visit (INDEPENDENT_AMBULATORY_CARE_PROVIDER_SITE_OTHER): Payer: Medicare Other | Admitting: Cardiology

## 2019-04-03 ENCOUNTER — Other Ambulatory Visit: Payer: Self-pay

## 2019-04-03 ENCOUNTER — Encounter: Payer: Self-pay | Admitting: Cardiology

## 2019-04-03 VITALS — BP 150/78 | HR 79 | Temp 97.8°F | Ht 63.0 in | Wt 175.0 lb

## 2019-04-03 DIAGNOSIS — I5042 Chronic combined systolic (congestive) and diastolic (congestive) heart failure: Secondary | ICD-10-CM

## 2019-04-03 DIAGNOSIS — Z8679 Personal history of other diseases of the circulatory system: Secondary | ICD-10-CM | POA: Diagnosis not present

## 2019-04-03 DIAGNOSIS — I428 Other cardiomyopathies: Secondary | ICD-10-CM | POA: Diagnosis not present

## 2019-04-03 DIAGNOSIS — I4891 Unspecified atrial fibrillation: Secondary | ICD-10-CM | POA: Diagnosis not present

## 2019-04-03 NOTE — Patient Instructions (Signed)
Medication Instructions:  Your physician recommends that you continue on your current medications as directed. Please refer to the Current Medication list given to you today.   Labwork: none  Procedures/Testing: Your physician has requested that you have an echocardiogram. Echocardiography is a painless test that uses sound waves to create images of your heart. It provides your doctor with information about the size and shape of your heart and how well your heart's chambers and valves are working. This procedure takes approximately one hour. There are no restrictions for this procedure.    Follow-Up: 6 months with Dr.McDowell  Any Additional Special Instructions Will Be Listed Below (If Applicable).     If you need a refill on your cardiac medications before your next appointment, please call your pharmacy.       Thank you for choosing [facility] !        

## 2019-04-03 NOTE — Progress Notes (Signed)
Cardiology Office Note  Date: 04/03/2019   ID: Leslie Kim, DOB 05-16-1936, MRN [id number]  PCP:  Kari Baars, MD  Cardiologist:  Nona Dell, MD Electrophysiologist:  None   Chief Complaint  Patient presents with  . Cardiac follow-up    History of Present Illness: Leslie Kim is an 83 y.o. female last seen in September 2019.  She is here today for a routine follow-up visit.  She actually missed her last scheduled visit.  She states that she has kept up with Dr. Juanetta Gosling, saw him with lab work in the last 6 months.  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  She helps to arrange all his medications.  She reports NYHA class II dyspnea, no palpitations or angina.  Last echocardiogram in March 2019 revealed LVEF in the range of 35 to 40% with mild diastolic dysfunction.  She continues on a stable medical regimen as outlined below.  Reports stable weight, no leg swelling or orthopnea.  We discussed obtaining a follow-up echocardiogram.  We are requesting her interval lab work for review as well.  I personally reviewed her ECG today which shows sinus rhythm with incomplete left bundle branch block.  Past Medical History:  Diagnosis Date  . Essential hypertension   . New onset atrial fibrillation (HCC) 11/2015   a. s/p DCCV on 12/18/2015 b. started on Eliquis  . Nonischemic cardiomyopathy (HCC)    a. 11/2015: echo showing EF of 30-35%, cath with nonobstructive dz  . Type 2 diabetes mellitus (HCC)     Past Surgical History:  Procedure Laterality Date  . CARDIAC CATHETERIZATION N/A 12/23/2015   Procedure: Right/Left Heart Cath and Coronary Angiography;  Surgeon: Laurey Morale, MD;  Location: [facility] INVASIVE CV LAB;  Service: Cardiovascular;  Laterality: N/A;  . CHOLECYSTECTOMY      Current Outpatient Medications  Medication Sig Dispense Refill  . acetaminophen (TYLENOL) 500 MG tablet Take 1,000 mg by mouth every 6 (six) hours as needed. Pain    . b  complex vitamins tablet Take 1 tablet by mouth every other day.    . carvedilol (COREG) 6.25 MG tablet TAKE 1 TABLET BY MOUTH TWICE DAILY WITH MEAL 180 tablet 3  . Cranberry 500 MG CAPS Take 1 capsule by mouth daily.    Marland Kitchen ELIQUIS 5 MG TABS tablet TAKE 1 TABLET(5 MG) BY MOUTH TWICE DAILY 60 tablet 11  . esomeprazole (NEXIUM) 20 MG capsule Take 20 mg by mouth daily at 12 noon.    . furosemide (LASIX) 40 MG tablet TAKE 1 TABLET(40 MG) BY MOUTH DAILY 30 tablet 2  . Glycopyrrolate-Formoterol (BEVESPI AEROSPHERE) 9-4.8 MCG/ACT AERO Inhale 9 mcg into the lungs 2 (two) times daily.    Marland Kitchen losartan (COZAAR) 50 MG tablet TAKE 1 TABLET(50 MG) BY MOUTH TWICE DAILY 180 tablet 3  . Magnesium 250 MG TABS Take 1 tablet by mouth daily.    Marland Kitchen PROAIR HFA 108 (90 Base) MCG/ACT inhaler Inhale 2 puffs into the lungs 4 (four) times daily as needed.  12  . sitaGLIPtan-metformin (JANUMET) 50-1000 MG per tablet Take 1 tablet by mouth 2 (two) times daily.    Marland Kitchen spironolactone (ALDACTONE) 25 MG tablet TAKE 1/2 TABLET(12.5 MG) BY MOUTH DAILY 45 tablet 3  . TRELEGY ELLIPTA 100-62.5-25 MCG/INH AEPB Inhale 1 puff into the lungs daily.  5  . vitamin B-12 (CYANOCOBALAMIN) 1000 MCG tablet Take 1,500 mcg by mouth every other day.      No  current facility-administered medications for this visit.    Allergies:  Codeine   Social History: The patient  reports that she quit smoking about 11 years ago. Her smoking use included cigarettes. She has never used smokeless tobacco. She reports that she does not drink alcohol or use drugs.   ROS:  Please see the history of present illness. Otherwise, complete review of systems is positive for arthritic pains.  All other systems are reviewed and negative.   Physical Exam: VS:  BP (!) 150/78   Pulse 79   Temp 97.8 F (36.6 C)   Ht 5\' 3"  (1.6 m)   Wt 175 lb (79.4 kg)   SpO2 97%   BMI 31.00 kg/m , BMI Body mass index is 31 kg/m.  Wt Readings from Last 3 Encounters:  04/03/19 175 lb  (79.4 kg)  04/24/18 178 lb (80.7 kg)  01/22/18 174 lb (78.9 kg)    General: Patient appears comfortable at rest. HEENT: Conjunctiva and lids normal, oropharynx clear. Neck: Supple, no elevated JVP or carotid bruits, no thyromegaly. Lungs: Clear to auscultation, nonlabored breathing at rest. Cardiac: Regular rate and rhythm, no S3 or significant systolic murmur. Abdomen: Soft, nontender, bowel sounds present. Extremities: No pitting edema, distal pulses 2+. Skin: Warm and dry. Musculoskeletal: No kyphosis. Neuropsychiatric: Alert and oriented x3, affect grossly appropriate.  ECG:  An ECG dated 10/17/2017 was personally reviewed today and demonstrated:  Sinus rhythm with incomplete left bundle branch block.  Recent Labwork:  No interval lab work for review.  Other Studies Reviewed Today:  Echocardiogram 10/01/2017: Study Conclusions  - Left ventricle: The cavity size was normal. Wall thickness was increased in a pattern of moderate LVH. Systolic function was moderately reduced. The estimated ejection fraction was in the range of 35% to 40%. Diffuse hypokinesis. Doppler parameters are consistent with abnormal left ventricular relaxation (grade 1 diastolic dysfunction). Doppler parameters are consistent with high ventricular filling pressure. - Aortic valve: Valve area (VTI): 2.84 cm^2. Valve area (Vmax): 2.69 cm^2. Valve area (Vmean): 2.27 cm^2. - Mitral valve: Moderately to severely calcified annulus. Normal thickness leaflets . - Left atrium: The atrium was mildly dilated. - Pulmonary arteries: PA peak pressure: 35 mm Hg (S). PASP is borderline elevated. - Technically difficult study.  Assessment and Plan:  1.  Nonischemic cardiomyopathy with chronic combined heart failure.  Last LVEF 35 to 40% range by echocardiogram in March 2019.  She appears euvolemic and has been tolerating present medical therapy.  No changes were made today.  Follow-up echocardiogram.   2.  History of atrial fibrillation, no palpitations and maintaining sinus rhythm by ECG today.  Requesting interval lab work from Dr. Luan Pulling.  She reports no bleeding problems on Eliquis.  3.  Essential hypertension, blood pressure is mildly elevated today.  She reports compliance with her medications.  We discussed sodium restriction and continued follow-up with Dr. Luan Pulling.   Medication Adjustments/Labs and Tests Ordered: Current medicines are reviewed at length with the patient today.  Concerns regarding medicines are outlined above.   Tests Ordered: Orders Placed This Encounter  Procedures  . EKG 12-Lead  . ECHOCARDIOGRAM COMPLETE    Medication Changes: No orders of the defined types were placed in this encounter.   Disposition:  Follow up 6 months in the [place] office.  Signed, Satira Sark, MD, [facility] 04/03/2019 2:05 PM    [place] at [facility] [address] [place], [place] [address] Phone: [phone]; Fax: [phone]

## 2019-04-08 DIAGNOSIS — Z23 Encounter for immunization: Secondary | ICD-10-CM | POA: Diagnosis not present

## 2019-04-11 ENCOUNTER — Ambulatory Visit (HOSPITAL_COMMUNITY): Payer: Medicare Other

## 2019-04-21 ENCOUNTER — Ambulatory Visit (HOSPITAL_COMMUNITY)
Admission: RE | Admit: 2019-04-21 | Discharge: 2019-04-21 | Disposition: A | Discharge status: Home / Self Care | Payer: Medicare Other | Source: Ambulatory Visit | Attending: Cardiology | Admitting: Cardiology

## 2019-04-21 ENCOUNTER — Other Ambulatory Visit: Payer: Self-pay

## 2019-04-21 DIAGNOSIS — I428 Other cardiomyopathies: Secondary | ICD-10-CM | POA: Diagnosis not present

## 2019-04-21 NOTE — Progress Notes (Signed)
*  PRELIMINARY RESULTS* Echocardiogram 2D Echocardiogram has been performed.  Leslie Kim 04/21/2019, 1:40 PM

## 2019-04-24 ENCOUNTER — Telehealth: Payer: Self-pay

## 2019-04-24 DIAGNOSIS — Z79899 Other long term (current) drug therapy: Secondary | ICD-10-CM

## 2019-04-24 MED ORDER — ENTRESTO 49-51 MG PO TABS: 1.0000 | ORAL_TABLET | Freq: Two times a day (BID) | ORAL | 6 refills | Status: DC

## 2019-04-24 NOTE — Telephone Encounter (Signed)
-----   Message from Satira Sark, MD sent at 04/22/2019  8:18 AM EDT ----- Results reviewed.  LVEF remains reduced at 35 to 40% on current medical regimen.  She has been clinically stable in terms of fluid status based on last office visit.  If she has adequate prescription coverage, consider stopping losartan 50 mg twice daily and convert to Entresto 49/51 mg twice daily.  She would need a BMET in 7 to 10 days after switch.

## 2019-04-24 NOTE — Telephone Encounter (Signed)
Patient said in 2017 she tried Leslie Kim but had problems with it.Did not remember what the issue was.Is willing to try again.She has not taken losartan today and will will wait 36 hrs before taking Entresto.I mailed her lab slip

## 2019-05-07 ENCOUNTER — Other Ambulatory Visit: Payer: Self-pay

## 2019-05-07 ENCOUNTER — Telehealth: Payer: Self-pay

## 2019-05-07 ENCOUNTER — Other Ambulatory Visit (HOSPITAL_COMMUNITY)
Admission: RE | Admit: 2019-05-07 | Discharge: 2019-05-07 | Disposition: A | Discharge status: Home / Self Care | Payer: Medicare Other | Source: Ambulatory Visit | Attending: Cardiology | Admitting: Cardiology

## 2019-05-07 DIAGNOSIS — Z79899 Other long term (current) drug therapy: Secondary | ICD-10-CM | POA: Insufficient documentation

## 2019-05-07 LAB — BASIC METABOLIC PANEL
Anion gap: 12 (ref 5–15)
BUN: 17 mg/dL (ref 8–23)
CO2: 25 mmol/L (ref 22–32)
Calcium: 9.2 mg/dL (ref 8.9–10.3)
Chloride: 95 mmol/L — ABNORMAL LOW (ref 98–111)
Creatinine, Ser: 1.14 mg/dL — ABNORMAL HIGH (ref 0.44–1.00)
GFR calc Af Amer: 53 mL/min — ABNORMAL LOW (ref >60)
GFR calc non Af Amer: 45 mL/min — ABNORMAL LOW (ref >60)
Glucose, Bld: 114 mg/dL — ABNORMAL HIGH (ref 70–99)
Potassium: 4.4 mmol/L (ref 3.5–5.1)
Sodium: 132 mmol/L — ABNORMAL LOW (ref 135–145)

## 2019-05-07 NOTE — Telephone Encounter (Signed)
-----   Message from Satira Sark, MD sent at 05/07/2019  2:38 PM EDT ----- Results reviewed.  Renal function stable after change to Entresto, potassium normal.

## 2019-05-07 NOTE — Telephone Encounter (Signed)
Called pt., no answer. Left message for pt to return call.  

## 2019-05-08 ENCOUNTER — Other Ambulatory Visit: Payer: Self-pay

## 2019-05-08 MED ORDER — FUROSEMIDE 40 MG PO TABS: ORAL_TABLET | ORAL | 3 refills | Status: DC

## 2019-05-08 NOTE — Telephone Encounter (Signed)
refilled lasix

## 2019-05-12 DIAGNOSIS — I2510 Atherosclerotic heart disease of native coronary artery without angina pectoris: Secondary | ICD-10-CM | POA: Diagnosis not present

## 2019-05-12 DIAGNOSIS — I10 Essential (primary) hypertension: Secondary | ICD-10-CM | POA: Diagnosis not present

## 2019-05-12 DIAGNOSIS — J449 Chronic obstructive pulmonary disease, unspecified: Secondary | ICD-10-CM | POA: Diagnosis not present

## 2019-05-12 DIAGNOSIS — I5022 Chronic systolic (congestive) heart failure: Secondary | ICD-10-CM | POA: Diagnosis not present

## 2019-05-13 ENCOUNTER — Telehealth: Payer: Self-pay | Admitting: Cardiology

## 2019-05-13 NOTE — Telephone Encounter (Signed)
Spoke with Pt who states that she looked at bottle and Dr. Luan Pulling sent in for [facility] for 24-26 mg Daily. Pt informed that Dr. Domenic Polite prescribed Entresto 40-51 mg two times daily. Pt voiced understanding

## 2019-05-13 NOTE — Telephone Encounter (Signed)
Pharmacy has different instructions on her sacubitril-valsartan (ENTRESTO) 49-51 MG [[id number]]

## 2019-05-20 ENCOUNTER — Telehealth: Payer: Self-pay | Admitting: Cardiology

## 2019-05-20 MED ORDER — ENTRESTO 24-26 MG PO TABS: 1.0000 | ORAL_TABLET | Freq: Two times a day (BID) | ORAL | 3 refills | Status: DC

## 2019-05-20 NOTE — Telephone Encounter (Signed)
Patient has gotten nauseated and tired every day since taking [facility]

## 2019-05-20 NOTE — Telephone Encounter (Signed)
Let's cut the dose of Entresto to 24/26 mg twice daily to see if she tolerates that better.

## 2019-05-20 NOTE — Telephone Encounter (Signed)
Patient states that Leslie Kim is making her sick. Would like return phone call to discuss options. / tg

## 2019-05-20 NOTE — Telephone Encounter (Signed)
Patient informed and verbalized understanding. Patient has this dose at home already that was given to her by her PCP. Patient will use what she has.

## 2019-05-28 ENCOUNTER — Telehealth: Payer: Self-pay | Admitting: Cardiology

## 2019-05-28 MED ORDER — LOSARTAN POTASSIUM 50 MG PO TABS: 50.0000 mg | ORAL_TABLET | Freq: Two times a day (BID) | ORAL | 3 refills | Status: DC

## 2019-05-28 NOTE — Telephone Encounter (Signed)
I am sorry to hear that she is not tolerating the Entresto, this is what I had hoped to keep her on for her cardiomyopathy. I would suggest that we go back to Cozaar which she was taking previously and tolerating.

## 2019-05-28 NOTE — Telephone Encounter (Signed)
Pt made aware. Looking in chart, cozaar 50 bid was last dose pt was reported taking. [facility].

## 2019-05-28 NOTE — Telephone Encounter (Signed)
Returned pt call. She states that she has been taking the [facility] 24-26 and it is still making her feel very nauseated and weak. She has not been taking her blood pressure, so she is not sure that it is not too low. She eats before taking medication and she drinks plenty of water daily. She is not having any SOB, chest pain or dizziness. She asks if there is another medication she could possibly try with the same benefits. Please advise.

## 2019-07-09 DIAGNOSIS — E1121 Type 2 diabetes mellitus with diabetic nephropathy: Secondary | ICD-10-CM | POA: Diagnosis not present

## 2019-07-09 DIAGNOSIS — J449 Chronic obstructive pulmonary disease, unspecified: Secondary | ICD-10-CM | POA: Diagnosis not present

## 2019-07-09 DIAGNOSIS — I4891 Unspecified atrial fibrillation: Secondary | ICD-10-CM | POA: Diagnosis not present

## 2019-07-09 DIAGNOSIS — I2510 Atherosclerotic heart disease of native coronary artery without angina pectoris: Secondary | ICD-10-CM | POA: Diagnosis not present

## 2019-08-12 ENCOUNTER — Ambulatory Visit
Admission: EM | Admit: 2019-08-12 | Discharge: 2019-08-12 | Disposition: A | Discharge status: Home / Self Care | Payer: Medicare Other | Source: Home / Self Care

## 2019-08-12 ENCOUNTER — Emergency Department (HOSPITAL_COMMUNITY): Payer: Medicare Other

## 2019-08-12 ENCOUNTER — Other Ambulatory Visit: Payer: Self-pay

## 2019-08-12 ENCOUNTER — Inpatient Hospital Stay (HOSPITAL_COMMUNITY)
Admission: EM | Admit: 2019-08-12 | Discharge: 2019-08-14 | DRG: 177 | Disposition: A | Discharge status: Home / Self Care | Payer: Medicare Other | Source: Ambulatory Visit | Attending: Family Medicine | Admitting: Family Medicine

## 2019-08-12 ENCOUNTER — Encounter (HOSPITAL_COMMUNITY): Payer: Self-pay

## 2019-08-12 ENCOUNTER — Other Ambulatory Visit: Payer: Medicare Other

## 2019-08-12 DIAGNOSIS — I130 Hypertensive heart and chronic kidney disease with heart failure and stage 1 through stage 4 chronic kidney disease, or unspecified chronic kidney disease: Secondary | ICD-10-CM | POA: Diagnosis present

## 2019-08-12 DIAGNOSIS — I10 Essential (primary) hypertension: Secondary | ICD-10-CM | POA: Diagnosis present

## 2019-08-12 DIAGNOSIS — Z833 Family history of diabetes mellitus: Secondary | ICD-10-CM | POA: Diagnosis not present

## 2019-08-12 DIAGNOSIS — I480 Paroxysmal atrial fibrillation: Secondary | ICD-10-CM | POA: Diagnosis present

## 2019-08-12 DIAGNOSIS — U071 COVID-19: Secondary | ICD-10-CM

## 2019-08-12 DIAGNOSIS — N1831 Chronic kidney disease, stage 3a: Secondary | ICD-10-CM | POA: Diagnosis present

## 2019-08-12 DIAGNOSIS — J1282 Pneumonia due to coronavirus disease 2019: Secondary | ICD-10-CM | POA: Diagnosis present

## 2019-08-12 DIAGNOSIS — I428 Other cardiomyopathies: Secondary | ICD-10-CM | POA: Diagnosis present

## 2019-08-12 DIAGNOSIS — J9601 Acute respiratory failure with hypoxia: Secondary | ICD-10-CM | POA: Diagnosis present

## 2019-08-12 DIAGNOSIS — J449 Chronic obstructive pulmonary disease, unspecified: Secondary | ICD-10-CM | POA: Diagnosis present

## 2019-08-12 DIAGNOSIS — Z87891 Personal history of nicotine dependence: Secondary | ICD-10-CM

## 2019-08-12 DIAGNOSIS — Z7951 Long term (current) use of inhaled steroids: Secondary | ICD-10-CM

## 2019-08-12 DIAGNOSIS — Z20822 Contact with and (suspected) exposure to covid-19: Secondary | ICD-10-CM

## 2019-08-12 DIAGNOSIS — R55 Syncope and collapse: Secondary | ICD-10-CM | POA: Diagnosis present

## 2019-08-12 DIAGNOSIS — Z79899 Other long term (current) drug therapy: Secondary | ICD-10-CM

## 2019-08-12 DIAGNOSIS — E1122 Type 2 diabetes mellitus with diabetic chronic kidney disease: Secondary | ICD-10-CM | POA: Diagnosis present

## 2019-08-12 DIAGNOSIS — B370 Candidal stomatitis: Secondary | ICD-10-CM | POA: Diagnosis present

## 2019-08-12 DIAGNOSIS — I4891 Unspecified atrial fibrillation: Secondary | ICD-10-CM | POA: Diagnosis present

## 2019-08-12 DIAGNOSIS — E119 Type 2 diabetes mellitus without complications: Secondary | ICD-10-CM

## 2019-08-12 DIAGNOSIS — Z7901 Long term (current) use of anticoagulants: Secondary | ICD-10-CM | POA: Diagnosis not present

## 2019-08-12 DIAGNOSIS — E1169 Type 2 diabetes mellitus with other specified complication: Secondary | ICD-10-CM

## 2019-08-12 DIAGNOSIS — I5042 Chronic combined systolic (congestive) and diastolic (congestive) heart failure: Secondary | ICD-10-CM | POA: Diagnosis present

## 2019-08-12 DIAGNOSIS — Z66 Do not resuscitate: Secondary | ICD-10-CM | POA: Diagnosis present

## 2019-08-12 LAB — COMPREHENSIVE METABOLIC PANEL
ALT: 27 U/L (ref 0–44)
AST: 30 U/L (ref 15–41)
Albumin: 3.3 g/dL — ABNORMAL LOW (ref 3.5–5.0)
Alkaline Phosphatase: 43 U/L (ref 38–126)
Anion gap: 12 (ref 5–15)
BUN: 24 mg/dL — ABNORMAL HIGH (ref 8–23)
CO2: 21 mmol/L — ABNORMAL LOW (ref 22–32)
Calcium: 8.7 mg/dL — ABNORMAL LOW (ref 8.9–10.3)
Chloride: 94 mmol/L — ABNORMAL LOW (ref 98–111)
Creatinine, Ser: 1.29 mg/dL — ABNORMAL HIGH (ref 0.44–1.00)
GFR calc Af Amer: 45 mL/min — ABNORMAL LOW (ref >60)
GFR calc non Af Amer: 39 mL/min — ABNORMAL LOW (ref >60)
Glucose, Bld: 141 mg/dL — ABNORMAL HIGH (ref 70–99)
Potassium: 3.9 mmol/L (ref 3.5–5.1)
Sodium: 127 mmol/L — ABNORMAL LOW (ref 135–145)
Total Bilirubin: 0.6 mg/dL (ref 0.3–1.2)
Total Protein: 7.7 g/dL (ref 6.5–8.1)

## 2019-08-12 LAB — TRIGLYCERIDES: Triglycerides: 146 mg/dL (ref <150)

## 2019-08-12 LAB — LACTATE DEHYDROGENASE: LDH: 155 U/L (ref 98–192)

## 2019-08-12 LAB — CBC WITH DIFFERENTIAL/PLATELET
Abs Immature Granulocytes: 0.03 10*3/uL (ref 0.00–0.07)
Basophils Absolute: 0 10*3/uL (ref 0.0–0.1)
Basophils Relative: 0 %
Eosinophils Absolute: 0 10*3/uL (ref 0.0–0.5)
Eosinophils Relative: 0 %
HCT: 31.4 % — ABNORMAL LOW (ref 36.0–46.0)
Hemoglobin: 9.7 g/dL — ABNORMAL LOW (ref 12.0–15.0)
Immature Granulocytes: 0 %
Lymphocytes Relative: 15 %
Lymphs Abs: 1.2 10*3/uL (ref 0.7–4.0)
MCH: 24.7 pg — ABNORMAL LOW (ref 26.0–34.0)
MCHC: 30.9 g/dL (ref 30.0–36.0)
MCV: 79.9 fL — ABNORMAL LOW (ref 80.0–100.0)
Monocytes Absolute: 0.4 10*3/uL (ref 0.1–1.0)
Monocytes Relative: 5 %
Neutro Abs: 6 10*3/uL (ref 1.7–7.7)
Neutrophils Relative %: 80 %
Platelets: 209 10*3/uL (ref 150–400)
RBC: 3.93 MIL/uL (ref 3.87–5.11)
RDW: 14.4 % (ref 11.5–15.5)
WBC: 7.5 10*3/uL (ref 4.0–10.5)
nRBC: 0 % (ref 0.0–0.2)

## 2019-08-12 LAB — POC SARS CORONAVIRUS 2 AG -  ED: SARS Coronavirus 2 Ag: POSITIVE — AB

## 2019-08-12 LAB — C-REACTIVE PROTEIN: CRP: 8.9 mg/dL — ABNORMAL HIGH (ref <1.0)

## 2019-08-12 LAB — PROCALCITONIN: Procalcitonin: 0.15 ng/mL

## 2019-08-12 LAB — POCT FASTING CBG KUC MANUAL ENTRY: POCT Glucose (KUC): 135 mg/dL — AB (ref 70–99)

## 2019-08-12 LAB — FIBRINOGEN: Fibrinogen: 557 mg/dL — ABNORMAL HIGH (ref 210–475)

## 2019-08-12 LAB — TROPONIN I (HIGH SENSITIVITY): Troponin I (High Sensitivity): 7 ng/L (ref <18)

## 2019-08-12 LAB — FERRITIN: Ferritin: 54 ng/mL (ref 11–307)

## 2019-08-12 LAB — D-DIMER, QUANTITATIVE: D-Dimer, Quant: 1.1 ug/mL-FEU — ABNORMAL HIGH (ref 0.00–0.50)

## 2019-08-12 LAB — LACTIC ACID, PLASMA: Lactic Acid, Venous: 1.4 mmol/L (ref 0.5–1.9)

## 2019-08-12 LAB — CBG MONITORING, ED: Glucose-Capillary: 127 mg/dL — ABNORMAL HIGH (ref 70–99)

## 2019-08-12 MED ORDER — ALBUTEROL SULFATE HFA 108 (90 BASE) MCG/ACT IN AERS
2.0000 | INHALATION_SPRAY | Freq: Four times a day (QID) | RESPIRATORY_TRACT | Status: DC
  Administered 2019-08-12: 2 via RESPIRATORY_TRACT
  Filled 2019-08-12: qty 6.7

## 2019-08-12 MED ORDER — ASCORBIC ACID 500 MG PO TABS
500.0000 mg | ORAL_TABLET | Freq: Every day | ORAL | Status: DC
  Administered 2019-08-13 – 2019-08-14 (×2): 500 mg via ORAL
  Filled 2019-08-12 (×2): qty 1

## 2019-08-12 MED ORDER — FUROSEMIDE 40 MG PO TABS: 40.0000 mg | ORAL_TABLET | Freq: Every day | ORAL | Status: DC

## 2019-08-12 MED ORDER — INSULIN ASPART 100 UNIT/ML ~~LOC~~ SOLN: 0.0000 [IU] | SUBCUTANEOUS | Status: DC

## 2019-08-12 MED ORDER — SODIUM CHLORIDE 0.9 % IV SOLN
200.0000 mg | Freq: Once | INTRAVENOUS | Status: AC
  Administered 2019-08-12: 200 mg via INTRAVENOUS
  Filled 2019-08-12: qty 40

## 2019-08-12 MED ORDER — DEXAMETHASONE SODIUM PHOSPHATE 10 MG/ML IJ SOLN
6.0000 mg | INTRAMUSCULAR | Status: DC
  Administered 2019-08-13 – 2019-08-14 (×2): 6 mg via INTRAVENOUS
  Filled 2019-08-12 (×2): qty 1

## 2019-08-12 MED ORDER — DEXAMETHASONE SODIUM PHOSPHATE 10 MG/ML IJ SOLN
10.0000 mg | Freq: Once | INTRAMUSCULAR | Status: AC
  Administered 2019-08-12: 10 mg via INTRAVENOUS
  Filled 2019-08-12: qty 1

## 2019-08-12 MED ORDER — ONDANSETRON HCL 4 MG/2ML IJ SOLN: 4.0000 mg | Freq: Four times a day (QID) | INTRAMUSCULAR | Status: DC | PRN

## 2019-08-12 MED ORDER — ACETAMINOPHEN 325 MG PO TABS
650.0000 mg | ORAL_TABLET | Freq: Four times a day (QID) | ORAL | Status: DC | PRN
  Administered 2019-08-13: 650 mg via ORAL
  Filled 2019-08-12: qty 2

## 2019-08-12 MED ORDER — APIXABAN 5 MG PO TABS
5.0000 mg | ORAL_TABLET | Freq: Two times a day (BID) | ORAL | Status: DC
  Administered 2019-08-12 – 2019-08-14 (×4): 5 mg via ORAL
  Filled 2019-08-12 (×4): qty 1

## 2019-08-12 MED ORDER — NYSTATIN 100000 UNIT/ML MT SUSP
5.0000 mL | Freq: Four times a day (QID) | OROMUCOSAL | Status: DC
  Administered 2019-08-12 – 2019-08-13 (×5): 500000 [IU] via ORAL
  Filled 2019-08-12 (×6): qty 5

## 2019-08-12 MED ORDER — SODIUM CHLORIDE 0.9% FLUSH
3.0000 mL | Freq: Two times a day (BID) | INTRAVENOUS | Status: DC
  Administered 2019-08-13: 3 mL via INTRAVENOUS

## 2019-08-12 MED ORDER — ONDANSETRON HCL 4 MG PO TABS: 4.0000 mg | ORAL_TABLET | Freq: Four times a day (QID) | ORAL | Status: DC | PRN

## 2019-08-12 MED ORDER — FLUTICASONE-UMECLIDIN-VILANT 100-62.5-25 MCG/INH IN AEPB: 1.0000 | INHALATION_SPRAY | Freq: Every day | RESPIRATORY_TRACT | Status: DC

## 2019-08-12 MED ORDER — GUAIFENESIN-DM 100-10 MG/5ML PO SYRP: 10.0000 mL | ORAL_SOLUTION | ORAL | Status: DC | PRN

## 2019-08-12 MED ORDER — ZINC SULFATE 220 (50 ZN) MG PO CAPS
220.0000 mg | ORAL_CAPSULE | Freq: Every day | ORAL | Status: DC
  Administered 2019-08-13 – 2019-08-14 (×2): 220 mg via ORAL
  Filled 2019-08-12 (×3): qty 1

## 2019-08-12 MED ORDER — NYSTATIN 100000 UNIT/ML MT SUSP: 5.0000 mL | Freq: Four times a day (QID) | OROMUCOSAL | 0 refills | Status: DC

## 2019-08-12 MED ORDER — HYDROCOD POLST-CPM POLST ER 10-8 MG/5ML PO SUER: 5.0000 mL | Freq: Two times a day (BID) | ORAL | Status: DC | PRN

## 2019-08-12 MED ORDER — DEXAMETHASONE 4 MG PO TABS: 6.0000 mg | ORAL_TABLET | ORAL | Status: DC

## 2019-08-12 MED ORDER — SODIUM CHLORIDE 0.9 % IV SOLN
100.0000 mg | Freq: Every day | INTRAVENOUS | Status: DC
  Administered 2019-08-13 – 2019-08-14 (×2): 100 mg via INTRAVENOUS
  Filled 2019-08-12 (×4): qty 20

## 2019-08-12 MED ORDER — INSULIN ASPART 100 UNIT/ML ~~LOC~~ SOLN
0.0000 [IU] | Freq: Three times a day (TID) | SUBCUTANEOUS | Status: DC
  Administered 2019-08-13: 1 [IU] via SUBCUTANEOUS
  Administered 2019-08-13 – 2019-08-14 (×4): 3 [IU] via SUBCUTANEOUS

## 2019-08-12 MED ORDER — UMECLIDINIUM-VILANTEROL 62.5-25 MCG/INH IN AEPB: 1.0000 | INHALATION_SPRAY | Freq: Every day | RESPIRATORY_TRACT | Status: DC

## 2019-08-12 NOTE — ED Triage Notes (Signed)
Pt has developed worsening cough after  positive covid exposure, pt also has thrush

## 2019-08-12 NOTE — ED Triage Notes (Signed)
EMS reports pt has had weakness and diarrhea x 1 week.  Reports tested positive for covid at urgent care today.  Reports o2 sat 89% and also passed out at urgent care.  Pt presently alert and oriented.  Pt reports some sob.

## 2019-08-12 NOTE — ED Notes (Signed)
Daughter reports pt has had thrush in mouth x 1 week.  Daughter's phone number is [phone]

## 2019-08-12 NOTE — ED Notes (Signed)
Patient placed on 3L 02 for sats of 89-90% room air.

## 2019-08-12 NOTE — ED Provider Notes (Signed)
[facility]    CSN: [id number] Arrival date & time: 08/12/19  1419      History   Chief Complaint Chief Complaint  Patient presents with  . Cough    HPI Leslie Kim is a 84 y.o. female.   Leslie Kim 84 years old female presented to the urgent care with a complaint of Covid exposure the past week, cough and thrush for the past 3 to 4 days.  Denies sick exposure to  flu or strep.  Denies recent travel.  Denies aggravating or alleviating symptoms.  Report  Leslie Kim is located on her tongue.  Denies previous COVID infection or thrush infection.   Denies fever, chills, fatigue, nasal congestion, rhinorrhea, sore throat, cough, SOB, wheezing, chest pain, nausea, vomiting, changes in bowel or bladder habits.    The history is provided by the patient and a caregiver. No language interpreter was used.  Cough   Past Medical History:  Diagnosis Date  . Essential hypertension   . New onset atrial fibrillation ([place]) 11/2015   a. s/p DCCV on 12/18/2015 b. started on Eliquis  . Nonischemic cardiomyopathy ([place])    a. 11/2015: echo showing EF of 30-35%, cath with nonobstructive dz  . Type 2 diabetes mellitus [facility])     Patient Active Problem List   Diagnosis Date Noted  . Atrial fibrillation ([place]) 01/07/2016  . Nonischemic cardiomyopathy ([place]) 12/24/2015  . Acute on chronic systolic heart failure ([place]) 12/21/2015  . Atrial fibrillation with RVR ([place]) 12/18/2015  . HTN (hypertension) 12/18/2015  . Diabetes mellitus ([place]) 12/18/2015  . CAP (community acquired pneumonia) 12/18/2015    Past Surgical History:  Procedure Laterality Date  . CARDIAC CATHETERIZATION N/A 12/23/2015   Procedure: Right/Left Heart Cath and Coronary Angiography;  Surgeon: Larey Dresser, MD;  Location: [place] [facility];  Service: Cardiovascular;  Laterality: N/A;  . CHOLECYSTECTOMY      OB History   No obstetric history on file.      Home Medications    Prior to Admission medications     Medication Sig Start Date End Date Taking? Authorizing Provider  acetaminophen (TYLENOL) 500 MG tablet Take 1,000 mg by mouth every 6 (six) hours as needed. Pain    [provider]  b complex vitamins tablet Take 1 tablet by mouth every other day.    [provider]  carvedilol (COREG) 6.25 MG tablet TAKE 1 TABLET BY MOUTH TWICE DAILY WITH MEAL 01/22/19   Satira Sark, MD  Cranberry 500 MG CAPS Take 1 capsule by mouth daily.    [provider]  ELIQUIS 5 MG TABS tablet TAKE 1 TABLET(5 MG) BY MOUTH TWICE DAILY 03/17/19   Satira Sark, MD  esomeprazole (NEXIUM) 20 MG capsule Take 20 mg by mouth daily at 12 noon.    [provider]  furosemide (LASIX) 40 MG tablet TAKE 1 TABLET(40 MG) BY MOUTH DAILY 05/08/19   Satira Sark, MD  Glycopyrrolate-Formoterol (BEVESPI AEROSPHERE) 9-4.8 MCG/ACT AERO Inhale 9 mcg into the lungs 2 (two) times daily.    [provider]  losartan (COZAAR) 50 MG tablet Take 1 tablet (50 mg total) by mouth 2 (two) times daily. 05/28/19 08/26/19  Satira Sark, MD  Magnesium 250 MG TABS Take 1 tablet by mouth daily.    [provider]  PROAIR HFA 108 [phone] Base) MCG/ACT inhaler Inhale 2 puffs into the lungs 4 (four) times daily as needed. 09/07/17   [provider]  sacubitril-valsartan (ENTRESTO) 24-26 MG Take 1 tablet by mouth 2 (two) times daily. 05/20/19   Jonelle Sidle, MD  sitaGLIPtan-metformin (JANUMET) 50-1000 MG per tablet Take 1 tablet by mouth 2 (two) times daily.    [provider]  spironolactone (ALDACTONE) 25 MG tablet TAKE 1/2 TABLET(12.5 MG) BY MOUTH DAILY 03/03/19   Jonelle Sidle, MD  TRELEGY ELLIPTA 100-62.5-25 MCG/INH AEPB Inhale 1 puff into the lungs daily. 09/07/17   [provider]  vitamin B-12 (CYANOCOBALAMIN) 1000 MCG tablet Take 1,500 mcg by mouth every other day.     [provider]    Family History Family History  Problem Relation Age of  Onset  . Diabetes Mother   . Diabetes Father   . Cancer Father   . Diabetes Brother   . Diabetes Brother   . Diabetes Brother   . Diabetes Brother   . Diabetes Brother     Social History Social History   Tobacco Use  . Smoking status: Former Smoker    Types: Cigarettes    Quit date: 12/18/2007    Years since quitting: 11.6  . Smokeless tobacco: Never Used  . Tobacco comment: Quit for 8 years  Substance Use Topics  . Alcohol use: No    Alcohol/week: 0.0 standard drinks  . Drug use: No     Allergies   Codeine   Review of Systems Review of Systems  Constitutional: Negative.   HENT: Negative.   Respiratory: Positive for cough.   Cardiovascular: Negative.   Gastrointestinal: Negative.   Neurological: Negative.   All other systems reviewed and are negative.    Physical Exam Triage Vital Signs ED Triage Vitals [08/12/19 1435]  Enc Vitals Group     BP 120/71     Pulse Rate 78     Resp (!) 22     Temp 99.7 F (37.6 C)     Temp src      SpO2 (!) 89 %     Weight      Height      Head Circumference      Peak Flow      Pain Score      Pain Loc      Pain Edu?      Excl. in GC?    No data found.  Updated Vital Signs BP 120/71   Pulse 78   Temp 99.7 F (37.6 C)   Resp (!) 22   SpO2 (!) 89% Comment: pt also has copd  Visual Acuity Right Eye Distance:   Left Eye Distance:   Bilateral Distance:    Right Eye Near:   Left Eye Near:    Bilateral Near:     Physical Exam Vitals and nursing note reviewed.  Constitutional:      Appearance: Normal appearance. She is normal weight.  HENT:     Head: Normocephalic.     Right Ear: Tympanic membrane, ear canal and external ear normal. There is no impacted cerumen.     Left Ear: Tympanic membrane, ear canal and external ear normal. There is no impacted cerumen.     Nose: Nose normal. No congestion.     Mouth/Throat:     Mouth: Mucous membranes are moist.     Pharynx: No oropharyngeal exudate or posterior  oropharyngeal erythema.     Comments: Thrush present on tongue  Cardiovascular:     Rate and Rhythm: Normal rate and regular rhythm.     Pulses: Normal pulses.  Heart sounds: Normal heart sounds. No murmur.  Pulmonary:     Effort: Pulmonary effort is normal. No respiratory distress.     Breath sounds: Normal breath sounds. No wheezing or rhonchi.  Chest:     Chest wall: No tenderness.  Abdominal:     General: Abdomen is flat. Bowel sounds are normal. There is no distension.     Palpations: There is no mass.     Tenderness: There is no abdominal tenderness.  Skin:    Capillary Refill: Capillary refill takes less than 2 seconds.  Neurological:     General: No focal deficit present.     Mental Status: She is alert and oriented to person, place, and time.      UC Treatments / Results  Labs (all labs ordered are listed, but only abnormal results are displayed) Labs Reviewed  POC SARS CORONAVIRUS 2 AG -  ED - Abnormal; Notable for the following components:      Result Value   SARS Coronavirus 2 Ag Positive (*)    All other components within normal limits  POCT FASTING CBG [facility] MANUAL ENTRY - Abnormal; Notable for the following components:   POCT Glucose ([facility]) 135 (*)    All other components within normal limits    EKG   Radiology DG Chest Port 1 View  Result Date: 08/12/2019 CLINICAL DATA:  Shortness of breath. EXAM: PORTABLE CHEST 1 VIEW COMPARISON:  Dec 21, 2015. FINDINGS: Stable cardiomediastinal silhouette. No pneumothorax or pleural effusion is noted. Mild ill-defined opacities are noted peripherally in both lungs, particularly in the right, concerning for multifocal pneumonia. Bony thorax is unremarkable. IMPRESSION: Mild ill-defined opacities are noted peripherally in both lungs, particularly in the right lung, concerning for multifocal pneumonia, particularly of viral etiology. Electronically Signed   By: Lupita Raider M.D.   On: 08/12/2019 16:03     Procedures Procedures (including critical care time)  Medications Ordered in UC Medications - No data to display  Initial Impression / Assessment and Plan / UC Course  I have reviewed the triage vital signs and the nursing notes.  Pertinent labs & imaging results that were available during my care of the patient were reviewed by me and considered in my medical decision making (see chart for details).   Point-of-care COVID-19 test was ordered and result was reviewed.  Results is positive for COVID-19 infection.  During visit patient passed out lasting 10 to 15 seconds, lose ability to control her bladder, and regained consciousness.  2 L of oxygen was applied with oxygen saturation at 92%.  Ambulance was called and patient was transported to the hospital for further examination related to COVID-19 complications and rule out TIA or stroke.  Final Clinical Impressions(s) / UC Diagnoses   Final diagnoses:  COVID-19 virus infection  Oral thrush  Fainting spell     Discharge Instructions     Patient was discharged to go to ED via ambulance    ED Prescriptions    None     PDMP not reviewed this encounter.     Durward Parcel, FNP 08/12/19 1612

## 2019-08-12 NOTE — ED Provider Notes (Signed)
Emergency Department Provider Note   I have reviewed the triage vital signs and the nursing notes.   HISTORY  Chief Complaint Covid   HPI Leslie Kim is a 84 y.o. female with PMH of HTN, A-fib, nonischemic cardiomyopathy, and DM presents to the emergency department for evaluation from urgent care.  Patient arrives by EMS.  She presented there with concern for COVID-19 exposure with generalized weakness, diarrhea, shortness of breath.  Apparently tested positive for COVID-19 but during the visit had an episode of syncope lasting 10 to 15 seconds with loss of bladder control.  She regained consciousness spontaneously but had borderline low oxygen saturation.  She was started on 2 L nasal cannula and EMS was called for transport.  No report from urgent care of seizure-like activity or focal weakness/numbness.   Patient states she is been feeling badly since last week and that her daughter tested positive for COVID-19.  She denies any heart palpitations or chest pain.  She does describe some mild shortness of breath with cough and fever.  No radiation of symptoms or other modifying factors.  She has had nonbloody diarrhea without vomiting.     Past Medical History:  Diagnosis Date  . Essential hypertension   . New onset atrial fibrillation (H[facility]) 11/2015   a. s/p DCCV on 12/18/2015 b. started on Eliquis  . Nonischemic cardiomyopathy ([facility])    a. 11/2015: echo showing EF of 30-35%, cath with nonobstructive dz  . Type 2 diabetes mellitus [facility])     Patient Active Problem List   Diagnosis Date Noted  . Acute hypoxemic respiratory failure due to COVID-19 (H[facility]) 08/12/2019  . Chronic combined systolic (congestive) and diastolic (congestive) heart failure ([facility]) 08/12/2019  . Syncope 08/12/2019  . Atrial fibrillation ([facility]) 01/07/2016  . Nonischemic cardiomyopathy ([facility]) 12/24/2015  . Acute on chronic systolic heart failure ([facility]) 12/21/2015  . Atrial fibrillation with RVR ([facility]) 12/18/2015  .  HTN (hypertension) 12/18/2015  . Diabetes mellitus ([facility]) 12/18/2015  . CAP (community acquired pneumonia) 12/18/2015    Past Surgical History:  Procedure Laterality Date  . CARDIAC CATHETERIZATION N/A 12/23/2015   Procedure: Right/Left Heart Cath and Coronary Angiography;  Surgeon: Laurey Morale, MD;  Location: [facility];  Service: Cardiovascular;  Laterality: N/A;  . CHOLECYSTECTOMY      Allergies Codeine  Family History  Problem Relation Age of Onset  . Diabetes Mother   . Diabetes Father   . Cancer Father   . Diabetes Brother   . Diabetes Brother   . Diabetes Brother   . Diabetes Brother   . Diabetes Brother     Social History Social History   Tobacco Use  . Smoking status: Former Smoker    Types: Cigarettes    Quit date: 12/18/2007    Years since quitting: 11.6  . Smokeless tobacco: Never Used  . Tobacco comment: Quit for 8 years  Substance Use Topics  . Alcohol use: No    Alcohol/week: 0.0 standard drinks  . Drug use: No    Review of Systems  Constitutional: Positive fever with weakness (generalized).  Eyes: No visual changes. ENT: No sore throat. Cardiovascular: Denies chest pain. Positive syncope.  Respiratory: Positive shortness of breath and cough.  Gastrointestinal: Negative abdominal pain.  No nausea, no vomiting. Positive diarrhea.  No constipation. Genitourinary: Negative for dysuria. Musculoskeletal: Negative for back pain. Skin: Negative for rash. Neurological: Negative for headaches, focal weakness or numbness.  10-point ROS otherwise negative.  ____________________________________________  PHYSICAL EXAM:  VITAL SIGNS: ED Triage Vitals  Enc Vitals Group     BP 08/12/19 1542 (!) 135/101     Pulse Rate 08/12/19 1534 76     Resp 08/12/19 1542 (!) 31     Temp 08/12/19 1542 100 F (37.8 C)     Temp Source 08/12/19 1542 Oral     SpO2 08/12/19 1534 93 %     Weight 08/12/19 1531 175 lb (79.4 kg)     Height 08/12/19 1531 5\' 4"   (1.626 m)   Constitutional: Alert and oriented. Well appearing and in no acute distress. Eyes: Conjunctivae are normal. Head: Atraumatic. Nose: No congestion/rhinnorhea. Mouth/Throat: Mucous membranes are slightly dry.  Neck: No stridor.   Cardiovascular: Normal rate, regular rhythm. Good peripheral circulation. Grossly normal heart sounds.   Respiratory: Slight increased respiratory effort.  No retractions. Lungs CTAB. Gastrointestinal: Soft and nontender. No distention.  Musculoskeletal: No lower extremity tenderness nor edema. No gross deformities of extremities. Neurologic:  Normal speech and language. No gross focal neurologic deficits are appreciated.  Skin:  Skin is warm, dry and intact. No rash noted.   ____________________________________________   LABS (all labs ordered are listed, but only abnormal results are displayed)  Labs Reviewed  CBC WITH DIFFERENTIAL/PLATELET - Abnormal; Notable for the following components:      Result Value   Hemoglobin 9.7 (*)    HCT 31.4 (*)    MCV 79.9 (*)    MCH 24.7 (*)    All other components within normal limits  COMPREHENSIVE METABOLIC PANEL - Abnormal; Notable for the following components:   Sodium 127 (*)    Chloride 94 (*)    CO2 21 (*)    Glucose, Bld 141 (*)    BUN 24 (*)    Creatinine, Ser 1.29 (*)    Calcium 8.7 (*)    Albumin 3.3 (*)    GFR calc non Af Amer 39 (*)    GFR calc Af Amer 45 (*)    All other components within normal limits  D-DIMER, QUANTITATIVE (NOT AT [facility]) - Abnormal; Notable for the following components:   D-Dimer, Quant 1.10 (*)    All other components within normal limits  FIBRINOGEN - Abnormal; Notable for the following components:   Fibrinogen 557 (*)    All other components within normal limits  C-REACTIVE PROTEIN - Abnormal; Notable for the following components:   CRP 8.9 (*)    All other components within normal limits  CBG MONITORING, ED - Abnormal; Notable for the following components:    Glucose-Capillary 127 (*)    All other components within normal limits  CULTURE, BLOOD (ROUTINE X 2)  CULTURE, BLOOD (ROUTINE X 2)  LACTIC ACID, PLASMA  PROCALCITONIN  LACTATE DEHYDROGENASE  FERRITIN  TRIGLYCERIDES   ____________________________________________  EKG   EKG Interpretation  Date/Time:  Tuesday August 12 2019 15:41:51 EST Ventricular Rate:  74 PR Interval:    QRS Duration: 122 QT Interval:  416 QTC Calculation: 462 R Axis:   69 Text Interpretation: Sinus rhythm Atrial premature complex IVCD, consider atypical LBBB Similar to prior. No STEMI Confirmed by Nanda Quinton [phone]) on 08/12/2019 4:30:26 PM       ____________________________________________  RADIOLOGY  DG Chest Port 1 View  Result Date: 08/12/2019 CLINICAL DATA:  Shortness of breath. EXAM: PORTABLE CHEST 1 VIEW COMPARISON:  Dec 21, 2015. FINDINGS: Stable cardiomediastinal silhouette. No pneumothorax or pleural effusion is noted. Mild ill-defined opacities are noted peripherally in both lungs, particularly  in the right, concerning for multifocal pneumonia. Bony thorax is unremarkable. IMPRESSION: Mild ill-defined opacities are noted peripherally in both lungs, particularly in the right lung, concerning for multifocal pneumonia, particularly of viral etiology. Electronically Signed   By: Lupita Raider M.D.   On: 08/12/2019 16:03    ____________________________________________   PROCEDURES  Procedure(s) performed:   Procedures  CRITICAL CARE Performed by: Maia Plan Total critical care time: 35 minutes Critical care time was exclusive of separately billable procedures and treating other patients. Critical care was necessary to treat or prevent imminent or life-threatening deterioration. Critical care was time spent personally by me on the following activities: development of treatment plan with patient and/or surrogate as well as nursing, discussions with consultants, evaluation of patient's  response to treatment, examination of patient, obtaining history from patient or surrogate, ordering and performing treatments and interventions, ordering and review of laboratory studies, ordering and review of radiographic studies, pulse oximetry and re-evaluation of patient's condition.  Alona Bene, MD Emergency Medicine  ____________________________________________   INITIAL IMPRESSION / ASSESSMENT AND PLAN / ED COURSE  Pertinent labs & imaging results that were available during my care of the patient were reviewed by me and considered in my medical decision making (see chart for details).   Patient presents to the emergency department for evaluation of generalized weakness with syncope and Covid-like symptoms.  Patient had syncope at urgent care and was transferred by EMS.  She arrived on nasal cannula oxygen but this was removed.  Oxygen saturations remain in the mid to sometimes low 90s.  We will continue to monitor with very mild tachypnea noted.  Patient not doing well generally with generalized weakness and diarrhea.  Appears somewhat dehydrated but does have history of nonischemic cardiomyopathy.  Will wait on lab work to initiate large IV fluid bolus.  No focal abdominal tenderness.  No focal neurologic deficits on my initial exam.   Patient's labs reviewed.  D-dimer slightly elevated but no tachycardia or severe hypoxemia.  I feel the patient's vital signs and symptoms are well explained by COVID-19 and do not plan on CTA of the chest to evaluate for PE at this time.  Patient remains awake and alert here.  Have started Decadron and will admit.   Discussed patient's case with [facility], Dr. Allena Katz to request admission. Patient and family (if present) updated with plan. Care transferred to [facility] service.  I reviewed all nursing notes, vitals, pertinent old records, EKGs, labs, imaging (as available).  ____________________________________________  FINAL CLINICAL IMPRESSION(S) / ED  DIAGNOSES  Final diagnoses:  Acute respiratory failure with hypoxia (HCC)  COVID-19  Syncope, unspecified syncope type    MEDICATIONS GIVEN DURING THIS VISIT:  Medications  dexamethasone (DECADRON) injection 10 mg (10 mg Intravenous Given 08/12/19 1819)    Note:  This document was prepared using Dragon voice recognition software and may include unintentional dictation errors.  Alona Bene, MD, [facility] Emergency Medicine    Zamara Cozad, Arlyss Repress, MD 08/12/19 [phone]

## 2019-08-12 NOTE — Discharge Instructions (Addendum)
Patient was discharged to go to ED via ambulance

## 2019-08-12 NOTE — H&P (Signed)
History and Physical    Leslie Kim:[id number] DOB: Dec 27, 1935 DOA: 08/12/2019  PCP: Sinda Du, MD  Patient coming from: Urgent care  I have personally briefly reviewed patient's old medical records in [place]  Chief Complaint: Shortness of breath, syncopal episode  HPI: Leslie Kim is a 84 y.o. female with medical history significant for chronic combined systolic and diastolic CHF (EF 19-62%), NICM, atrial fibrillation s/p DCCV Dec 18, 2015 on Eliquis, CKD stage III, COPD, type 2 diabetes, and hypertension who presents to the ED from urgent care after a syncopal episode.  Patient states she has had about 7-10 days of cough productive of greenish/yellow sputum, generalized weakness, subjective fevers, chills, diarrhea, decreased food intake, nausea without emesis, and significant redness of her tongue.  She denies any dyspnea, diaphoresis, chest pain, abdominal pain, or dysuria.  She went to urgent care earlier 08/12/2019 for further evaluation.  She had a SARS-CoV-2 antigen test which resulted positive.  She was noted to have oral thrush.  While sitting down in a wheelchair she began to feel lightheaded and passed out.  She reportedly lost control of her bladder and regained consciousness after about 10-15 seconds.  She was placed on 2 L supplemental O2 with O2 saturation of 92%.  CBG was 135.  She was sent to the ED via EMS for further evaluation.  ED Course:  Initial vitals showed BP 135/101, pulse 76, RR 31, temp 100.0 Fahrenheit, SPO2 93% on room air. Per ED documentation SPO2 dropped to 89-90% and she was placed on 3 of supplemental O2 via [place].  WBC 7.5, hemoglobin 9.7,Labs are notable for platelets 209,000, sodium 127, potassium 3.9, bicarb 21, BUN 24, creatinine 1.29, AST 30, ALT 27, alk phos 43, total bilirubin 0.6, lactic acid 1.4, D-dimer 1.1, pro calcitonin 0.15, LDH 155, ferritin 54, fibrinogen 557, CRP 8.9. Blood cultures were obtained and pending.  Portable  chest x-ray showed mild opacities peripherally in both lungs, more prominent in the right lung.  Patient was given IV dexamethasone 10 mg once and the hospitalist service was consulted to admit for further evaluation and management.  Review of Systems: All systems reviewed and are negative except as documented in history of present illness above.   Past Medical History:  Diagnosis Date  . Essential hypertension   . New onset atrial fibrillation ([place]) 11/2015   a. s/p DCCV on 12/18/2015 b. started on Eliquis  . Nonischemic cardiomyopathy ([place])    a. 11/2015: echo showing EF of 30-35%, cath with nonobstructive dz  . Type 2 diabetes mellitus ([place])     Past Surgical History:  Procedure Laterality Date  . CARDIAC CATHETERIZATION N/A 12/23/2015   Procedure: Right/Left Heart Cath and Coronary Angiography;  Surgeon: Larey Dresser, MD;  Location: [facility];  Service: Cardiovascular;  Laterality: N/A;  . CHOLECYSTECTOMY      Social History:  reports that she quit smoking about 11 years ago. Her smoking use included cigarettes. She has never used smokeless tobacco. She reports that she does not drink alcohol or use drugs.  Allergies  Allergen Reactions  . Codeine Other (See Comments)    Too strong for patient      Family History  Problem Relation Age of Onset  . Diabetes Mother   . Diabetes Father   . Cancer Father   . Diabetes Brother   . Diabetes Brother   . Diabetes Brother   . Diabetes Brother   . Diabetes Brother  Prior to Admission medications   Medication Sig Start Date End Date Taking? Authorizing Provider  acetaminophen (TYLENOL) 500 MG tablet Take 1,000 mg by mouth every 6 (six) hours as needed. Pain   Yes [provider]  b complex vitamins tablet Take 1 tablet by mouth every other day.   Yes [provider]  carvedilol (COREG) 6.25 MG tablet TAKE 1 TABLET BY MOUTH TWICE DAILY WITH MEAL Patient taking differently: Take 6.25 mg by mouth  2 (two) times daily with a meal.  01/22/19  Yes Satira Sark, MD  Cranberry 500 MG CAPS Take 1 capsule by mouth daily.   Yes [provider]  ELIQUIS 5 MG TABS tablet TAKE 1 TABLET(5 MG) BY MOUTH TWICE DAILY Patient taking differently: Take 5 mg by mouth 2 (two) times daily.  03/17/19  Yes Satira Sark, MD  esomeprazole (NEXIUM) 20 MG capsule Take 20 mg by mouth daily.    Yes [provider]  furosemide (LASIX) 40 MG tablet TAKE 1 TABLET(40 MG) BY MOUTH DAILY Patient taking differently: Take 40 mg by mouth daily.  05/08/19  Yes Satira Sark, MD  Glycopyrrolate-Formoterol (BEVESPI AEROSPHERE) 9-4.8 MCG/ACT AERO Inhale 9 mcg into the lungs 2 (two) times daily.   Yes [provider]  losartan (COZAAR) 50 MG tablet Take 1 tablet (50 mg total) by mouth 2 (two) times daily. 05/28/19 08/26/19 Yes Satira Sark, MD  Magnesium 250 MG TABS Take 1 tablet by mouth daily.   Yes [provider]  PROAIR HFA 108 (90 Base) MCG/ACT inhaler Inhale 2 puffs into the lungs 4 (four) times daily as needed for wheezing or shortness of breath.  09/07/17  Yes [provider]  sitaGLIPtan-metformin (JANUMET) 50-1000 MG per tablet Take 1 tablet by mouth 2 (two) times daily.   Yes [provider]  spironolactone (ALDACTONE) 25 MG tablet TAKE 1/2 TABLET(12.5 MG) BY MOUTH DAILY Patient taking differently: Take 12.5 mg by mouth daily.  03/03/19  Yes Satira Sark, MD  TRELEGY ELLIPTA 100-62.5-25 MCG/INH AEPB Inhale 1 puff into the lungs daily. 09/07/17  Yes [provider]  vitamin B-12 (CYANOCOBALAMIN) 1000 MCG tablet Take 1,500 mcg by mouth every other day.    Yes [provider]  nystatin (MYCOSTATIN) 100000 UNIT/ML suspension Take 5 mLs (500,000 Units total) by mouth 4 (four) times daily. 08/12/19   Avegno, Darrelyn Hillock, FNP  sacubitril-valsartan (ENTRESTO) 24-26 MG Take 1 tablet by mouth 2 (two) times daily. Patient not taking: Reported on  08/12/2019 05/20/19   Satira Sark, MD    Physical Exam: Vitals:   08/12/19 1745 08/12/19 1800 08/12/19 1830 08/12/19 1900  BP:   (!) 132/51 (!) 115/54  Pulse: 73 73 72 73  Resp: (!) 23 (!) 24 (!) 28 (!) 23  Temp:      TempSrc:      SpO2: 97% 97% 96% 96%  Weight:      Height:       Constitutional: Elderly woman resting supine in bed, NAD, calm, comfortable Eyes: PERRL, lids and conjunctivae normal ENMT: Mucous membranes are moist.  Tongue is red with a few white plaques.Normal dentition.  Neck: normal, supple, no masses. Respiratory: Distant breath sounds without significant wheezing or crackles. Normal respiratory effort. No accessory muscle use.  Cardiovascular: Regular rate and rhythm, no murmurs / rubs / gallops. No extremity edema. 2+ pedal pulses. Abdomen: no tenderness, no masses palpated. No hepatosplenomegaly. Bowel sounds positive.  Musculoskeletal: no clubbing /  cyanosis. No joint deformity upper and lower extremities. Good ROM, no contractures. Normal muscle tone.  Skin: no rashes, lesions, ulcers. No induration Neurologic: CN 2-12 grossly intact. Sensation intact,Strength 5/5 in all 4.  Psychiatric: Normal judgment and insight. Alert and oriented x 3. Normal mood.     Labs on Admission: I have personally reviewed following labs and imaging studies  CBC: Recent Labs  Lab 08/12/19 1610  WBC 7.5  NEUTROABS 6.0  HGB 9.7*  HCT 31.4*  MCV 79.9*  PLT 481   Basic Metabolic Panel: Recent Labs  Lab 08/12/19 1610  NA 127*  K 3.9  CL 94*  CO2 21*  GLUCOSE 141*  BUN 24*  CREATININE 1.29*  CALCIUM 8.7*   GFR: Estimated Creatinine Clearance: 34.9 mL/min (A) (by C-G formula based on SCr of 1.29 mg/dL (H)). Liver Function Tests: Recent Labs  Lab 08/12/19 1610  AST 30  ALT 27  ALKPHOS 43  BILITOT 0.6  PROT 7.7  ALBUMIN 3.3*   No results for input(s): LIPASE, AMYLASE in the last 168 hours. No results for input(s): AMMONIA in the last 168  hours. Coagulation Profile: No results for input(s): INR, PROTIME in the last 168 hours. Cardiac Enzymes: No results for input(s): CKTOTAL, CKMB, CKMBINDEX, TROPONINI in the last 168 hours. BNP (last 3 results) No results for input(s): PROBNP in the last 8760 hours. HbA1C: No results for input(s): HGBA1C in the last 72 hours. CBG: Recent Labs  Lab 08/12/19 1532  GLUCAP 127*   Lipid Profile: Recent Labs    08/12/19 1610  TRIG 146   Thyroid Function Tests: No results for input(s): TSH, T4TOTAL, FREET4, T3FREE, THYROIDAB in the last 72 hours. Anemia Panel: Recent Labs    08/12/19 1610  FERRITIN 54   Urine analysis: No results found for: COLORURINE, APPEARANCEUR, LABSPEC, PHURINE, GLUCOSEU, HGBUR, BILIRUBINUR, KETONESUR, PROTEINUR, UROBILINOGEN, NITRITE, LEUKOCYTESUR  Radiological Exams on Admission: DG Chest Port 1 View  Result Date: 08/12/2019 CLINICAL DATA:  Shortness of breath. EXAM: PORTABLE CHEST 1 VIEW COMPARISON:  Dec 21, 2015. FINDINGS: Stable cardiomediastinal silhouette. No pneumothorax or pleural effusion is noted. Mild ill-defined opacities are noted peripherally in both lungs, particularly in the right, concerning for multifocal pneumonia. Bony thorax is unremarkable. IMPRESSION: Mild ill-defined opacities are noted peripherally in both lungs, particularly in the right lung, concerning for multifocal pneumonia, particularly of viral etiology. Electronically Signed   By: Marijo Conception M.D.   On: 08/12/2019 16:03    EKG: Independently reviewed. Sinus rhythm with incomplete LBBB, similar to prior.  Assessment/Plan Principal Problem:   Acute hypoxemic respiratory failure due to COVID-19 [facility]) Active Problems:   HTN (hypertension)   Diabetes mellitus (HCC)   Nonischemic cardiomyopathy (HCC)   Atrial fibrillation (HCC)   Chronic combined systolic (congestive) and diastolic (congestive) heart failure ([place])   Syncope  Leslie Kim is a 84 y.o. female with  medical history significant for chronic combined systolic and diastolic CHF (EF 85-63%), NICM, atrial fibrillation s/p DCCV Dec 18, 2015 on Eliquis, CKD stage III, COPD, type 2 diabetes, and hypertension who is admitted with acute hypoxemic respiratory failure due to COVID-19 viral infection.  Acute hypoxemic respiratory failure due to COVID-19 viral infection: SARS-CoV-2 antigen positive 08/12/2019.  Requiring 2-3 L supplemental O2 via [place].  Chest x-ray with bilateral peripheral opacities. -Start IV remdesivir per pharmacy protocol -Continue IV dexamethasone 6 mg daily -Continue supplemental oxygen as needed -Incentive spirometer, flutter valve, albuterol inhaler -Antitussives, vitamin C, zinc  Syncope: Transient  syncopal episode while sitting down in urgent care.  Suspect due to dehydration in setting of poor oral intake and COVID-19 viral infection.  EKG without significant change from prior.  Blood pressure borderline low on admission. -Check orthostatic vital signs, hold home BP meds for now -Monitor on telemetry  Chronic combined systolic and diastolic CHF/NICM: EF 26-94% by TTE 04/21/2019.  Appears compensated and euvolemic to mildly hypervolemic on admission.  Was planned to switch to [facility] by her cardiologist however she did not tolerate it and has since restarted losartan. -Holding home Coreg, losartan, spironolactone, Lasix with soft BPs -Monitor strict I/O's and daily weights  COPD: -Continue management as above with IV dexamethasone, supplemental oxygen -Continue Trelegy and albuterol  Paroxysmal atrial fibrillation: Remains in sinus rhythm with controlled rate.  Continue Eliquis.  Coreg on hold for now as above, restart as able.  Hypertension: Holding home BP meds for now as above.  Restart as able.  Type 2 diabetes: Holding home Janumet, continue sensitive SSI while in hospital.  CKD stage III: Appears stable relative to recent labs.  Continue monitor.  Oral  thrush: Continue nystatin.   DVT prophylaxis: Eliquis Code Status: DNR, confirmed with patient Family Communication: Discussed with patient, she has discussed with family Disposition Plan: Likely discharge to home pending clinical progress Consults called: None Admission status: Inpatient for management of acute hypoxemic respiratory failure due to COVID-19 viral infection.   Zada Finders MD [facility]  If 7PM-7AM, please contact night-coverage [email or web address]  08/12/2019, 7:18 PM

## 2019-08-12 NOTE — ED Notes (Signed)
1450 pt had syncopal event, ems was called emergency traffic, pt placed on nasal cannula at 2L , pt began responding to verbal stimuli . Attempted IV and was unsuccessful. Report called to [facility] ED

## 2019-08-13 DIAGNOSIS — U071 COVID-19: Principal | ICD-10-CM

## 2019-08-13 DIAGNOSIS — J9601 Acute respiratory failure with hypoxia: Secondary | ICD-10-CM

## 2019-08-13 LAB — CBC WITH DIFFERENTIAL/PLATELET
Abs Immature Granulocytes: 0.01 10*3/uL (ref 0.00–0.07)
Basophils Absolute: 0 10*3/uL (ref 0.0–0.1)
Basophils Relative: 0 %
Eosinophils Absolute: 0 10*3/uL (ref 0.0–0.5)
Eosinophils Relative: 0 %
HCT: 30 % — ABNORMAL LOW (ref 36.0–46.0)
Hemoglobin: 9.4 g/dL — ABNORMAL LOW (ref 12.0–15.0)
Immature Granulocytes: 0 %
Lymphocytes Relative: 25 %
Lymphs Abs: 0.8 10*3/uL (ref 0.7–4.0)
MCH: 25.1 pg — ABNORMAL LOW (ref 26.0–34.0)
MCHC: 31.3 g/dL (ref 30.0–36.0)
MCV: 80 fL (ref 80.0–100.0)
Monocytes Absolute: 0.1 10*3/uL (ref 0.1–1.0)
Monocytes Relative: 4 %
Neutro Abs: 2.1 10*3/uL (ref 1.7–7.7)
Neutrophils Relative %: 71 %
Platelets: 222 10*3/uL (ref 150–400)
RBC: 3.75 MIL/uL — ABNORMAL LOW (ref 3.87–5.11)
RDW: 14.6 % (ref 11.5–15.5)
WBC: 3 10*3/uL — ABNORMAL LOW (ref 4.0–10.5)
nRBC: 0 % (ref 0.0–0.2)

## 2019-08-13 LAB — C-REACTIVE PROTEIN: CRP: 10.3 mg/dL — ABNORMAL HIGH (ref <1.0)

## 2019-08-13 LAB — COMPREHENSIVE METABOLIC PANEL
ALT: 25 U/L (ref 0–44)
AST: 33 U/L (ref 15–41)
Albumin: 3.1 g/dL — ABNORMAL LOW (ref 3.5–5.0)
Alkaline Phosphatase: 39 U/L (ref 38–126)
Anion gap: 13 (ref 5–15)
BUN: 27 mg/dL — ABNORMAL HIGH (ref 8–23)
CO2: 19 mmol/L — ABNORMAL LOW (ref 22–32)
Calcium: 8.9 mg/dL (ref 8.9–10.3)
Chloride: 99 mmol/L (ref 98–111)
Creatinine, Ser: 1.15 mg/dL — ABNORMAL HIGH (ref 0.44–1.00)
GFR calc Af Amer: 52 mL/min — ABNORMAL LOW (ref >60)
GFR calc non Af Amer: 45 mL/min — ABNORMAL LOW (ref >60)
Glucose, Bld: 246 mg/dL — ABNORMAL HIGH (ref 70–99)
Potassium: 5 mmol/L (ref 3.5–5.1)
Sodium: 131 mmol/L — ABNORMAL LOW (ref 135–145)
Total Bilirubin: 0.6 mg/dL (ref 0.3–1.2)
Total Protein: 7.5 g/dL (ref 6.5–8.1)

## 2019-08-13 LAB — PHOSPHORUS: Phosphorus: 3.6 mg/dL (ref 2.5–4.6)

## 2019-08-13 LAB — GLUCOSE, CAPILLARY
Glucose-Capillary: 203 mg/dL — ABNORMAL HIGH (ref 70–99)
Glucose-Capillary: 207 mg/dL — ABNORMAL HIGH (ref 70–99)
Glucose-Capillary: 295 mg/dL — ABNORMAL HIGH (ref 70–99)

## 2019-08-13 LAB — MAGNESIUM: Magnesium: 1.9 mg/dL (ref 1.7–2.4)

## 2019-08-13 LAB — D-DIMER, QUANTITATIVE: D-Dimer, Quant: 0.97 ug/mL-FEU — ABNORMAL HIGH (ref 0.00–0.50)

## 2019-08-13 LAB — HEMOGLOBIN A1C
Hgb A1c MFr Bld: 7.8 % — ABNORMAL HIGH (ref 4.8–5.6)
Mean Plasma Glucose: 177.16 mg/dL

## 2019-08-13 LAB — FERRITIN: Ferritin: 50 ng/mL (ref 11–307)

## 2019-08-13 LAB — ABO/RH: ABO/RH(D): A POS

## 2019-08-13 MED ORDER — UMECLIDINIUM BROMIDE 62.5 MCG/INH IN AEPB
1.0000 | INHALATION_SPRAY | Freq: Every day | RESPIRATORY_TRACT | Status: DC
  Administered 2019-08-14: 1 via RESPIRATORY_TRACT

## 2019-08-13 MED ORDER — ALBUTEROL SULFATE HFA 108 (90 BASE) MCG/ACT IN AERS: 2.0000 | INHALATION_SPRAY | Freq: Three times a day (TID) | RESPIRATORY_TRACT | Status: DC

## 2019-08-13 MED ORDER — ALBUTEROL SULFATE HFA 108 (90 BASE) MCG/ACT IN AERS: 2.0000 | INHALATION_SPRAY | Freq: Four times a day (QID) | RESPIRATORY_TRACT | Status: DC | PRN

## 2019-08-13 MED ORDER — ALUM & MAG HYDROXIDE-SIMETH 200-200-20 MG/5ML PO SUSP
30.0000 mL | Freq: Four times a day (QID) | ORAL | Status: DC | PRN
  Administered 2019-08-13: 30 mL via ORAL
  Filled 2019-08-13: qty 30

## 2019-08-13 MED ORDER — FLUTICASONE FUROATE-VILANTEROL 100-25 MCG/INH IN AEPB
1.0000 | INHALATION_SPRAY | Freq: Every day | RESPIRATORY_TRACT | Status: DC
  Administered 2019-08-14: 1 via RESPIRATORY_TRACT

## 2019-08-13 MED ORDER — FLUTICASONE FUROATE-VILANTEROL 100-25 MCG/INH IN AEPB
INHALATION_SPRAY | RESPIRATORY_TRACT | Status: AC
  Administered 2019-08-13: 1 via RESPIRATORY_TRACT
  Filled 2019-08-13: qty 28

## 2019-08-13 MED ORDER — UMECLIDINIUM BROMIDE 62.5 MCG/INH IN AEPB
INHALATION_SPRAY | RESPIRATORY_TRACT | Status: AC
  Administered 2019-08-13: 1 via RESPIRATORY_TRACT
  Filled 2019-08-13: qty 7

## 2019-08-13 MED ORDER — GLUCERNA SHAKE PO LIQD
237.0000 mL | Freq: Three times a day (TID) | ORAL | Status: DC
  Administered 2019-08-13: 237 mL via ORAL

## 2019-08-13 MED ORDER — MELATONIN 3 MG PO TABS
6.0000 mg | ORAL_TABLET | Freq: Every day | ORAL | Status: DC
  Administered 2019-08-13: 6 mg via ORAL
  Filled 2019-08-13: qty 2

## 2019-08-13 MED ORDER — ADULT MULTIVITAMIN W/MINERALS CH
1.0000 | ORAL_TABLET | Freq: Every day | ORAL | Status: DC
  Administered 2019-08-13 – 2019-08-14 (×2): 1 via ORAL
  Filled 2019-08-13 (×3): qty 1

## 2019-08-13 NOTE — Progress Notes (Signed)
Notified by telemetry patient had run of svt lasting approximately 4 seconds rate up to 168, patient asymptomatic. VS stable, notified midlevel awaiting response at this time.

## 2019-08-13 NOTE — Progress Notes (Signed)
Patient Demographics:    Leslie Kim, is a 84 y.o. female, DOB - 1936/01/08, JKK:[id number]  Admit date - 08/12/2019   Admitting Physician Lenore Cordia, MD  Outpatient Primary MD for the patient is Sinda Du, MD  LOS - 1  Chief Complaint  Patient presents with  . Covid        Subjective:    [name] today has no fevers, no emesis,  No chest pain, cough and shortness of breath persist --Trying to wean off oxygen =-No further dizzy spells or palpitation  Assessment  & Plan :    Principal Problem:   Acute hypoxemic respiratory failure due to COVID-19 [facility]) Active Problems:   HTN (hypertension)   Diabetes mellitus (HCC)   Nonischemic cardiomyopathy (HCC)   Atrial fibrillation (HCC)   Chronic combined systolic (congestive) and diastolic (congestive) heart failure (HCC)   Syncope  Brief summary 84 y.o. female with medical history significant for chronic combined systolic and diastolic CHF (EF 71-69%), NICM, atrial fibrillation s/p DCCV Dec 18, 2015 on Eliquis, CKD stage III, COPD, type 2 diabetes, and hypertension who presents to the ED from urgent care after a syncopal episode.  A/p acute hypoxic respiratory failure secondary to COVID-19 pneumonia-------hypoxia, dyspnea and respiratory symptoms overall improving with IV remdesivir and IV Decadron started on 08/12/2019  -Continue bronchodilators and mucolytics -Attempt to wean off oxygen  Syncope: Transient syncopal episode while sitting down in urgent care. Suspect due to dehydration in setting of poor oral intake and COVID-19 viral infection. EKG without significant change from prior. Blood pressure borderline low on admission. -No further syncopal events, phosphorus, magnesium and potassium WNL  Chronic combined systolic and diastolic CHF/NICM: EF 67-89% by TTE 04/21/2019. Marland Kitchen Was planned to switch to [facility] by her cardiologist  however she did not tolerate it and has since restarted losartan. Aldactone, Lasix and losartan on hold due to soft BP on admission  COPD: -Continue IV Decadron,  -Continue Trelegy and albuterol  Paroxysmal atrial fibrillation: Remains in sinus rhythm with controlled rate. Continue Eliquis.    Coreg was held on admission due to soft BP  Hypertension: --BP improving  Type 2 diabetes: Restart Janumet, continue sensitive SSI with NovoLog while on steroids  CKD stage III: Appears stable relative to recent labs. Continue monitor.  Disposition/Need for in-Hospital Stay- patient unable to be discharged at this time due to -possible discharge if hypoxia improves with steroids and remdesivir  Code Status : DNR  Family Communication:   NA (patient is alert, awake and coherent)  Disposition Plan  : TBD Consults  :  NA DVT Prophylaxis  :  Eliquis  - SCDs  Lab Results  Component Value Date   PLT 222 08/13/2019   Inpatient Medications  Scheduled Meds: . apixaban  5 mg Oral BID  . vitamin C  500 mg Oral Daily  . dexamethasone (DECADRON) injection  6 mg Intravenous Q24H  . feeding supplement (GLUCERNA SHAKE)  237 mL Oral TID BM  . fluticasone furoate-vilanterol  1 puff Inhalation Daily  . insulin aspart  0-9 Units Subcutaneous TID WC  . Melatonin  6 mg Oral QHS  . multivitamin with minerals  1 tablet Oral Daily  . nystatin  5 mL Oral QID  .  sodium chloride flush  3 mL Intravenous Q12H  . umeclidinium bromide  1 puff Inhalation Daily  . zinc sulfate  220 mg Oral Daily   Continuous Infusions: . remdesivir 100 mg in NS 100 mL 100 mg (08/13/19 1212)   PRN Meds:.acetaminophen, albuterol, chlorpheniramine-HYDROcodone, guaiFENesin-dextromethorphan, ondansetron **OR** ondansetron (ZOFRAN) IV    Anti-infectives (From admission, onward)   Start     Dose/Rate Route Frequency Ordered Stop   08/13/19 1000  remdesivir 100 mg in sodium chloride 0.9 % 100 mL IVPB     100 mg 200  mL/hr over 30 Minutes Intravenous Daily 08/12/19 1908 08/17/19 0959   08/12/19 1915  remdesivir 200 mg in sodium chloride 0.9% 250 mL IVPB     200 mg 580 mL/hr over 30 Minutes Intravenous Once 08/12/19 1908 08/12/19 2222        Objective:   Vitals:   08/13/19 0918 08/13/19 1141 08/13/19 1200 08/13/19 1416  BP:    117/81  Pulse:    70  Resp:    18  Temp:    (!) 97.5 F (36.4 C)  TempSrc:    Oral  SpO2: 95% 95% 93% 92%  Weight:      Height:        Wt Readings from Last 3 Encounters:  08/13/19 77.7 kg  04/03/19 79.4 kg  04/24/18 80.7 kg     Intake/Output Summary (Last 24 hours) at 08/13/2019 1919 Last data filed at 08/13/2019 1820 Gross per 24 hour  Intake 720 ml  Output --  Net 720 ml     Physical Exam  Gen:- Awake Alert,  In NAD,  HEENT:- [place].AT, No sclera icterus Nose - [place] 2L/min Neck-Supple Neck,No JVD,.  Lungs-improving air movement, no rales, few scattered wheezes CV- S1, S2 normal, irregular  Abd-  +ve B.Sounds, Abd Soft, No tenderness,    Extremity/Skin:- No  edema, pedal pulses present  Psych-affect is appropriate, oriented x3 Neuro-no new focal deficits, no tremors   Data Review:   Micro Results Recent Results (from the past 240 hour(s))  Blood Culture (routine x 2)     Status: None (Preliminary result)   Collection Time: 08/12/19  3:45 PM   Specimen: Right Antecubital; Blood  Result Value Ref Range Status   Specimen Description RIGHT ANTECUBITAL  Final   Special Requests   Final    BOTTLES DRAWN AEROBIC AND ANAEROBIC Blood Culture adequate volume   Culture   Final    NO GROWTH < 24 HOURS Performed at [facility], [address], [place], [place] [address]    Report Status PENDING  Incomplete  Blood Culture (routine x 2)     Status: None (Preliminary result)   Collection Time: 08/12/19  4:18 PM   Specimen: Left Antecubital; Blood  Result Value Ref Range Status   Specimen Description LEFT ANTECUBITAL  Final   Special Requests   Final     BOTTLES DRAWN AEROBIC AND ANAEROBIC Blood Culture adequate volume   Culture   Final    NO GROWTH < 24 HOURS Performed at [facility], [address] [place], [place] [address]    Report Status PENDING  Incomplete    Radiology Reports DG Chest Port 1 View  Result Date: 08/12/2019 CLINICAL DATA:  Shortness of breath. EXAM: PORTABLE CHEST 1 VIEW COMPARISON:  Dec 21, 2015. FINDINGS: Stable cardiomediastinal silhouette. No pneumothorax or pleural effusion is noted. Mild ill-defined opacities are noted peripherally in both lungs, particularly in the right, concerning for multifocal pneumonia. Bony  thorax is unremarkable. IMPRESSION: Mild ill-defined opacities are noted peripherally in both lungs, particularly in the right lung, concerning for multifocal pneumonia, particularly of viral etiology. Electronically Signed   By: Lupita Raider M.D.   On: 08/12/2019 16:03    CBC Recent Labs  Lab 08/12/19 1610 08/13/19 0536  WBC 7.5 3.0*  HGB 9.7* 9.4*  HCT 31.4* 30.0*  PLT 209 222  MCV 79.9* 80.0  MCH 24.7* 25.1*  MCHC 30.9 31.3  RDW 14.4 14.6  LYMPHSABS 1.2 0.8  MONOABS 0.4 0.1  EOSABS 0.0 0.0  BASOSABS 0.0 0.0   Chemistries  Recent Labs  Lab 08/12/19 1610 08/13/19 0536  NA 127* 131*  K 3.9 5.0  CL 94* 99  CO2 21* 19*  GLUCOSE 141* 246*  BUN 24* 27*  CREATININE 1.29* 1.15*  CALCIUM 8.7* 8.9  MG  --  1.9  AST 30 33  ALT 27 25  ALKPHOS 43 39  BILITOT 0.6 0.6   ------------------------------------------------------------------------------------------------------------------ Recent Labs    08/12/19 1610  TRIG 146   Lab Results  Component Value Date   HGBA1C 7.8 (H) 08/12/2019   ------------------------------------------------------------------------------------------------------------------ No results for input(s): TSH, T4TOTAL, T3FREE, THYROIDAB in the last 72 hours.  Invalid input(s):  FREET3 ------------------------------------------------------------------------------------------------------------------ Recent Labs    08/12/19 1610 08/13/19 0536  FERRITIN 54 50    Coagulation profile No results for input(s): INR, PROTIME in the last 168 hours.  Recent Labs    08/12/19 1610 08/13/19 0536  DDIMER 1.10* 0.97*   Cardiac Enzymes No results for input(s): CKMB, TROPONINI, MYOGLOBIN in the last 168 hours.  Invalid input(s): CK ------------------------------------------------------------------------------------------------------------------    Component Value Date/Time   BNP 661.0 (H) 12/18/2015 1093   Shon Hale M.D on 08/13/2019 at 7:19 PM  Go to [email or web address] - for contact info  [facility] - Office  [phone]

## 2019-08-13 NOTE — Progress Notes (Signed)
Telemetry placed on patient and called in to [facility].  Patient is SR with BBB and occasional PACs.

## 2019-08-13 NOTE — Progress Notes (Signed)
Initial Nutrition Assessment  DOCUMENTATION CODES:   Not applicable  INTERVENTION:  -Recommend liberalizing diet to encourage po intake of meals  -Glucerna Shake po TID, each supplement provides 220 kcal and 10 grams of protein  -MVI with minerals daily  NUTRITION DIAGNOSIS:   Increased nutrient needs related to catabolic illness(Covid-19 infection) as evidenced by estimated needs.  GOAL:   Patient will meet greater than or equal to 90% of their needs  MONITOR:   Weight trends, Labs, I & O's, Supplement acceptance, Diet advancement  REASON FOR ASSESSMENT:   Malnutrition Screening Tool    ASSESSMENT:  RD working remotely.  84 year old female with medical history significant for chronic combined CHF (EF 35-40%), NICM, atrial fibrillation s/p DCCV (2017), CKD3, COPD, T2DM, and HTN. Patient presented with complaints of 7-10 day history of cough productive of greenish/yellow sputum, generalized weakness, subjective fevers, diarrhea, decreased oral intake, nausea without emesis, and oral thrush. Patient presented to urgent care 1/12 for evaluation and Covid-19 antigen test resulted positive. Patient with syncopal episode while sitting in a wheelchair, lost control of her bladder and regained consciousness after 10-15 seconds, patient placed on 2L supplemental O2, arrived to ED from urgent care via EMS.  Patient admitted with acute hypoxemic respiratory failure due to COVID-19 infection.  Per chart, patient eating 50% of meals at this time on HH/CM diet. Patient with increased calorie/protien needs secondary to Covid infection, would recommend liberating diet to encourage po intake of meals. Will provide Glucerna to aid with calorie/protein needs.   Medications reviewed and include: Vit C, SS novolog, Zinc sulfate, Remdesivir  Labs:  CBGs 207, 203 since admit, Na 131 (L), BUN 27 (H), Cr 1.15 (H), WBC 3.0 (L), Hgb 9.4 (L)  Weight history reviewed; noted 3.74 lb wt loss in the past  4 months which is insignificant for time frame.  NUTRITION - FOCUSED PHYSICAL EXAM: Unable to complete   Diet Order:   Diet Order            Diet heart healthy/carb modified Room service appropriate? Yes; Fluid consistency: Thin  Diet effective now              EDUCATION NEEDS:   No education needs have been identified at this time  Skin:  Skin Assessment: Reviewed RN Assessment  Last BM:  1/12  Height:   Ht Readings from Last 1 Encounters:  08/13/19 5\' 4"  (1.626 m)    Weight:   Wt Readings from Last 1 Encounters:  08/13/19 77.7 kg    Ideal Body Weight:  54.5 kg  BMI:  Body mass index is 29.4 kg/m.  Estimated Nutritional Needs:   Kcal:  08/15/19 (MSJ x 1.6-1.8)  Protein:  99-111  Fluid:  >1.8 L/day   [id number], RD, LDN Clinical Nutrition Office Telephone [phone] After Hours/Weekend Pager: [phone]

## 2019-08-14 LAB — CBC WITH DIFFERENTIAL/PLATELET
Abs Immature Granulocytes: 0.02 10*3/uL (ref 0.00–0.07)
Basophils Absolute: 0 10*3/uL (ref 0.0–0.1)
Basophils Relative: 0 %
Eosinophils Absolute: 0 10*3/uL (ref 0.0–0.5)
Eosinophils Relative: 0 %
HCT: 30.8 % — ABNORMAL LOW (ref 36.0–46.0)
Hemoglobin: 9.8 g/dL — ABNORMAL LOW (ref 12.0–15.0)
Immature Granulocytes: 0 %
Lymphocytes Relative: 18 %
Lymphs Abs: 1 10*3/uL (ref 0.7–4.0)
MCH: 24.9 pg — ABNORMAL LOW (ref 26.0–34.0)
MCHC: 31.8 g/dL (ref 30.0–36.0)
MCV: 78.4 fL — ABNORMAL LOW (ref 80.0–100.0)
Monocytes Absolute: 0.4 10*3/uL (ref 0.1–1.0)
Monocytes Relative: 6 %
Neutro Abs: 4.2 10*3/uL (ref 1.7–7.7)
Neutrophils Relative %: 76 %
Platelets: 284 10*3/uL (ref 150–400)
RBC: 3.93 MIL/uL (ref 3.87–5.11)
RDW: 14.6 % (ref 11.5–15.5)
WBC: 5.6 10*3/uL (ref 4.0–10.5)
nRBC: 0 % (ref 0.0–0.2)

## 2019-08-14 LAB — COMPREHENSIVE METABOLIC PANEL
ALT: 25 U/L (ref 0–44)
AST: 24 U/L (ref 15–41)
Albumin: 3.3 g/dL — ABNORMAL LOW (ref 3.5–5.0)
Alkaline Phosphatase: 43 U/L (ref 38–126)
Anion gap: 11 (ref 5–15)
BUN: 31 mg/dL — ABNORMAL HIGH (ref 8–23)
CO2: 20 mmol/L — ABNORMAL LOW (ref 22–32)
Calcium: 9 mg/dL (ref 8.9–10.3)
Chloride: 99 mmol/L (ref 98–111)
Creatinine, Ser: 0.94 mg/dL (ref 0.44–1.00)
GFR calc Af Amer: >60 mL/min (ref >60)
GFR calc non Af Amer: 57 mL/min — ABNORMAL LOW (ref >60)
Glucose, Bld: 242 mg/dL — ABNORMAL HIGH (ref 70–99)
Potassium: 4.3 mmol/L (ref 3.5–5.1)
Sodium: 130 mmol/L — ABNORMAL LOW (ref 135–145)
Total Bilirubin: 0.5 mg/dL (ref 0.3–1.2)
Total Protein: 7.8 g/dL (ref 6.5–8.1)

## 2019-08-14 LAB — FERRITIN: Ferritin: 55 ng/mL (ref 11–307)

## 2019-08-14 LAB — MAGNESIUM: Magnesium: 1.8 mg/dL (ref 1.7–2.4)

## 2019-08-14 LAB — GLUCOSE, CAPILLARY
Glucose-Capillary: 136 mg/dL — ABNORMAL HIGH (ref 70–99)
Glucose-Capillary: 217 mg/dL — ABNORMAL HIGH (ref 70–99)
Glucose-Capillary: 201 mg/dL — ABNORMAL HIGH (ref 70–99)

## 2019-08-14 LAB — PHOSPHORUS: Phosphorus: 2.8 mg/dL (ref 2.5–4.6)

## 2019-08-14 LAB — D-DIMER, QUANTITATIVE: D-Dimer, Quant: 0.82 ug/mL-FEU — ABNORMAL HIGH (ref 0.00–0.50)

## 2019-08-14 LAB — C-REACTIVE PROTEIN: CRP: 6.4 mg/dL — ABNORMAL HIGH (ref <1.0)

## 2019-08-14 MED ORDER — INSULIN ASPART 100 UNIT/ML FLEXPEN: 0.0000 [IU] | PEN_INJECTOR | Freq: Three times a day (TID) | SUBCUTANEOUS | 0 refills | Status: DC

## 2019-08-14 MED ORDER — ADULT MULTIVITAMIN W/MINERALS CH: 1.0000 | ORAL_TABLET | Freq: Every day | ORAL | 0 refills | Status: DC

## 2019-08-14 MED ORDER — CARVEDILOL 6.25 MG PO TABS: 6.2500 mg | ORAL_TABLET | Freq: Two times a day (BID) | ORAL | 3 refills | Status: DC

## 2019-08-14 MED ORDER — GUAIFENESIN ER 600 MG PO TB12: 600.0000 mg | ORAL_TABLET | Freq: Two times a day (BID) | ORAL | 0 refills | Status: AC

## 2019-08-14 MED ORDER — APIXABAN 5 MG PO TABS: 5.0000 mg | ORAL_TABLET | Freq: Two times a day (BID) | ORAL | 1 refills | Status: DC

## 2019-08-14 MED ORDER — ZINC SULFATE 220 (50 ZN) MG PO CAPS: 220.0000 mg | ORAL_CAPSULE | Freq: Every day | ORAL | 0 refills | Status: DC

## 2019-08-14 MED ORDER — PREDNISONE 20 MG PO TABS: 40.0000 mg | ORAL_TABLET | Freq: Every day | ORAL | 0 refills | Status: DC

## 2019-08-14 MED ORDER — MELATONIN 3 MG PO TABS: 6.0000 mg | ORAL_TABLET | Freq: Every day | ORAL | 1 refills | Status: DC

## 2019-08-14 MED ORDER — ASCORBIC ACID 500 MG PO TABS: 500.0000 mg | ORAL_TABLET | Freq: Every day | ORAL | 1 refills | Status: DC

## 2019-08-14 NOTE — Discharge Summary (Signed)
Leslie Kim, is a 84 y.o. female  DOB 07-Dec-1935  MRN [id number].  Admission date:  08/12/2019  Admitting Physician  Lenore Cordia, MD  Discharge Date:  08/14/2019   Primary MD  Sinda Du, MD  Recommendations for primary care physician for things to follow:   1) you are taking Eliquis/apixaban which is your blood thinner so please Avoid ibuprofen/Advil/Aleve/Motrin/Goody Powders/Naproxen/BC powders/Meloxicam/Diclofenac/Indomethacin and other Nonsteroidal anti-inflammatory medications as these will make you more likely to bleed and can cause stomach ulcers, can also cause Kidney problems.   2) You are strongly advised to  to isolate for at least 21 days from the date of your diagnoses with COVID-19 infection--please always wear a mask if you have to go outside the house  3) please use sliding scale insulin as ordered while on prednisone/steroids as your blood sugars will tend to run higher -insulin aspart (novoLOG) injection 0-10 Units  0-10 Units Subcutaneous, 3 times daily with meals  CBG < 70: Implement Hypoglycemia Standing Orders and refer to Hypoglycemia Standing Orders sidebar report   CBG 70 - 120: 0 unit CBG 121 - 150: 0 unit  CBG 151 - 200: 1 unit  CBG 201 - 250: 2 units  CBG 251 - 300: 4 units  CBG 301 - 350: 6 units   CBG 351 - 400: 8 units  CBG > 400: 10 units  4)Patient scheduled for outpatient Remdesivir infusion at 1130AM on Friday 08/15/2019 and Saturday 08/01/2019.  Please advise them to report to [facility] at [address].  Drive to the security guard and tell them you are here for an infusion. They will direct you to the front entrance where we will come and get you.  For questions call [phone]  5) you need repeat BMP/Chem 7  blood test within a week with your primary care physician  Admission Diagnosis  Acute respiratory failure with hypoxia ([place])  [J96.01] Syncope, unspecified syncope type [R55] Acute hypoxemic respiratory failure due to COVID-19 ([place]) [U07.1, J96.01] COVID-19 [U07.1]   Discharge Diagnosis  Acute respiratory failure with hypoxia (HCC) [J96.01] Syncope, unspecified syncope type [R55] Acute hypoxemic respiratory failure due to COVID-19 ([place]) [U07.1, J96.01] COVID-19 [U07.1]   Principal Problem:   Acute hypoxemic respiratory failure due to COVID-19 [facility]) Active Problems:   HTN (hypertension)   Diabetes mellitus ([place])   Nonischemic cardiomyopathy (HCC)   Atrial fibrillation (HCC)   Chronic combined systolic (congestive) and diastolic (congestive) heart failure ([place])   Syncope     Past Medical History:  Diagnosis Date  . Essential hypertension   . New onset atrial fibrillation ([place]) 11/2015   a. s/p DCCV on 12/18/2015 b. started on Eliquis  . Nonischemic cardiomyopathy ([place])    a. 11/2015: echo showing EF of 30-35%, cath with nonobstructive dz  . Type 2 diabetes mellitus ([place])     Past Surgical History:  Procedure Laterality Date  . CARDIAC CATHETERIZATION N/A 12/23/2015   Procedure: Right/Left Heart Cath and Coronary Angiography;  Surgeon: Elby Showers  Aundra Dubin, MD;  Location: [place] [facility];  Service: Cardiovascular;  Laterality: N/A;  . CHOLECYSTECTOMY       HPI  from the history and physical done on the day of admission:    - Chief Complaint: Shortness of breath, syncopal episode  HPI: Leslie Kim is a 84 y.o. female with medical history significant for chronic combined systolic and diastolic CHF (EF 61-95%), NICM, atrial fibrillation s/p DCCV Dec 18, 2015 on Eliquis, CKD stage III, COPD, type 2 diabetes, and hypertension who presents to the ED from urgent care after a syncopal episode.  Patient states she has had about 7-10 days of cough productive of greenish/yellow sputum, generalized weakness, subjective fevers, chills, diarrhea, decreased food intake, nausea without emesis, and significant  redness of her tongue.  She denies any dyspnea, diaphoresis, chest pain, abdominal pain, or dysuria.  She went to urgent care earlier 08/12/2019 for further evaluation.  She had a SARS-CoV-2 antigen test which resulted positive.  She was noted to have oral thrush.  While sitting down in a wheelchair she began to feel lightheaded and passed out.  She reportedly lost control of her bladder and regained consciousness after about 10-15 seconds.  She was placed on 2 L supplemental O2 with O2 saturation of 92%.  CBG was 135.  She was sent to the ED via EMS for further evaluation.  ED Course:  Initial vitals showed BP 135/101, pulse 76, RR 31, temp 100.0 Fahrenheit, SPO2 93% on room air. Per ED documentation SPO2 dropped to 89-90% and she was placed on 3 of supplemental O2 via [place].  WBC 7.5, hemoglobin 9.7,Labs are notable for platelets 209,000, sodium 127, potassium 3.9, bicarb 21, BUN 24, creatinine 1.29, AST 30, ALT 27, alk phos 43, total bilirubin 0.6, lactic acid 1.4, D-dimer 1.1, pro calcitonin 0.15, LDH 155, ferritin 54, fibrinogen 557, CRP 8.9. Blood cultures were obtained and pending.  Portable chest x-ray showed mild opacities peripherally in both lungs, more prominent in the right lung.  Patient was given IV dexamethasone 10 mg once and the hospitalist service was consulted to admit for further evaluation and management.  Review of Systems: All systems reviewed and are negative except as documented in history of present illness above.   Hospital Course:   Brief Narrative:- [name] is a 84 y.o. female with medical history significant for chronic combined systolic and diastolic CHF (EF 09-32%), NICM, atrial fibrillation s/p DCCV Dec 18, 2015 on Eliquis, CKD stage III, COPD, type 2 diabetes, and hypertension who is admitted with acute hypoxemic respiratory failure due to COVID-19 viral infection.   A/p Acute Hypoxemic Respiratory Failure due to COVID-19 Viral infection:- SARS-CoV-2  antigen positive 08/12/2019.  Requiring 2-3 L supplemental O2 via [place].  Chest x-ray with bilateral peripheral opacities. -Treated with IV remdesivir from 08/12/2019 , patient will complete IV remdesivir infusions as outpatient starting 08/15/2019 with last dose being 08/16/2019 per pharmacy protocol -Treated with IV dexamethasone 6 mg daily -Okay to discharge home on p.o. prednisone for additional 5 days -No hypoxia even post ambulation -Incentive spirometer, flutter valve, albuterol inhaler -Antitussives, vitamin C, zinc  Syncope: Transient syncopal episode while sitting down in urgent care.  Suspect due to dehydration in setting of poor oral intake and COVID-19 viral infection.  EKG without significant change from prior.  Blood pressure borderline low on admission. -No further syncopal events, phosphorus, magnesium and potassium WNL  Chronic combined systolic and diastolic CHF/NICM: EF 67-12% by TTE 04/21/2019.  Marland Kitchen  Was planned to switch to [facility] by her cardiologist however she did not tolerate it and has since restarted losartan. Okay to resume Coreg, losartan, spironolactone, Lasix - -repeat BMP with PCP in 1 to 2 weeks  COPD: -No hypoxia, treated with IV Decadron, and discharged with p.o. prednisone -Continue Trelegy and albuterol  Paroxysmal atrial fibrillation: Remains in sinus rhythm with controlled rate.  Continue Eliquis.  Restart Coreg  Hypertension: -BP much higher after holding home BP meds, okay to restart meds.  Type 2 diabetes: Restart Janumet, continue sensitive SSI with NovoLog while on steroids  CKD stage III: Appears stable relative to recent labs.  Continue monitor.  Oral thrush: Continue nystatin.  DVT prophylaxis: Eliquis  Code Status: DNR, confirmed with patient Family Communication: Discussed with patient, she has discussed with family Disposition Plan:  Home to have outpatient infusion of remdesivir on 08/15/2019 on 08/16/2019 Discharge Condition:  stable, no dyspnea on exertion and no hypoxia  Follow UP  Follow-up Information    [facility] Follow up on 08/28/2019.   Why: You are scheduled for a hospital follow up appointment with Cherly Beach, NP on Thursday, January 28th at 1:00pm. Contact information: [address] [place] [address] [phone]          Diet and Activity recommendation:  As advised  Discharge Instructions    Discharge Instructions    Call MD for:  difficulty breathing, headache or visual disturbances   Complete by: As directed    Call MD for:  persistant dizziness or light-headedness   Complete by: As directed    Call MD for:  persistant nausea and vomiting   Complete by: As directed    Call MD for:  severe uncontrolled pain   Complete by: As directed    Call MD for:  temperature >100.4   Complete by: As directed    Diet - low sodium heart healthy   Complete by: As directed    Diet Carb Modified   Complete by: As directed    Discharge instructions   Complete by: As directed    1) you are taking Eliquis/apixaban which is your blood thinner so please Avoid ibuprofen/Advil/Aleve/Motrin/Goody Powders/Naproxen/BC powders/Meloxicam/Diclofenac/Indomethacin and other Nonsteroidal anti-inflammatory medications as these will make you more likely to bleed and can cause stomach ulcers, can also cause Kidney problems.   2) You are strongly advised to  to isolate for at least 21 days from the date of your diagnoses with COVID-19 infection--please always wear a mask if you have to go outside the house  3) please use sliding scale insulin as ordered while on prednisone/steroids as your blood sugars will tend to run higher -insulin aspart (novoLOG) injection 0-10 Units  0-10 Units Subcutaneous, 3 times daily with meals  CBG < 70: Implement Hypoglycemia Standing Orders and refer to Hypoglycemia Standing Orders sidebar report   CBG 70 - 120: 0 unit CBG 121 - 150: 0  unit  CBG 151 - 200: 1 unit  CBG 201 - 250: 2 units  CBG 251 - 300: 4 units  CBG 301 - 350: 6 units   CBG 351 - 400: 8 units  CBG > 400: 10 units  4)Patient scheduled for outpatient Remdesivir infusion at 1130AM on Friday 08/15/2019 and Saturday 08/01/2019.  Please advise them to report to [facility] at [address]  Drive to the security guard and tell them you are here for an infusion. They will direct you to the  front entrance where we will come and get you.  For questions call [phone]  5) you need repeat BMP/Chem 7  blood test within a week with your primary care physician   Home infusion instructions   Complete by: As directed    Instructions: Flushing of vascular access device: 0.9% NaCl pre/post medication administration and prn patency; Heparin 100 u/ml, 7m for implanted ports and Heparin 10u/ml, 530mfor all other central venous catheters.   Increase activity slowly   Complete by: As directed         Discharge Medications     Allergies as of 08/14/2019      Reactions   Codeine Other (See Comments)   Too strong for patient        Medication List    TAKE these medications   acetaminophen 500 MG tablet Commonly known as: TYLENOL Take 1,000 mg by mouth every 6 (six) hours as needed. Pain   ascorbic acid 500 MG tablet Commonly known as: VITAMIN C Take 1 tablet (500 mg total) by mouth daily. Start taking on: August 15, 2019   b complex vitamins tablet Take 1 tablet by mouth every other day.   Bevespi Aerosphere 9-4.8 MCG/ACT Aero Generic drug: Glycopyrrolate-Formoterol Inhale 9 mcg into the lungs 2 (two) times daily.   carvedilol 6.25 MG tablet Commonly known as: COREG Take 1 tablet (6.25 mg total) by mouth 2 (two) times daily with a meal. What changed: See the new instructions.   Cranberry 500 MG Caps Take 1 capsule by mouth daily.   Eliquis 5 MG Tabs tablet Generic drug: apixaban TAKE 1 TABLET(5 MG) BY MOUTH TWICE DAILY What changed:  See the new instructions.   apixaban 5 MG Tabs tablet Commonly known as: Eliquis Take 1 tablet (5 mg total) by mouth 2 (two) times daily. What changed: You were already taking a medication with the same name, and this prescription was added. Make sure you understand how and when to take each.   esomeprazole 20 MG capsule Commonly known as: NEXIUM Take 20 mg by mouth daily.   furosemide 40 MG tablet Commonly known as: LASIX TAKE 1 TABLET(40 MG) BY MOUTH DAILY What changed:   how much to take  how to take this  when to take this  additional instructions   guaiFENesin 600 MG 12 hr tablet Commonly known as: Mucinex Take 1 tablet (600 mg total) by mouth 2 (two) times daily for 10 days.   insulin aspart 100 UNIT/ML FlexPen Commonly known as: NOVOLOG Inject 0-10 Units into the skin 4 (four) times daily -  before meals and at bedtime. insulin aspart (novoLOG) injection 0-10 Units 0-10 Units Subcutaneous, 3 times daily with meals CBG < 70: Implement Hypoglycemia Standing Orders and refer to Hypoglycemia Standing Orders sidebar report  CBG 70 - 120: 0 unit CBG 121 - 150: 0 unit  CBG 151 - 200: 1 unit CBG 201 - 250: 2 units CBG 251 - 300: 4 units CBG 301 - 350: 6 units  CBG 351 - 400: 8 units  CBG > 400: 10 units   losartan 50 MG tablet Commonly known as: COZAAR Take 1 tablet (50 mg total) by mouth 2 (two) times daily.   Magnesium 250 MG Tabs Take 1 tablet by mouth daily.   Melatonin 3 MG Tabs Take 2 tablets (6 mg total) by mouth at bedtime.   multivitamin with minerals Tabs tablet Take 1 tablet by mouth daily. Start taking on: August 15, 2019  nystatin 100000 UNIT/ML suspension Commonly known as: MYCOSTATIN Take 5 mLs (500,000 Units total) by mouth 4 (four) times daily.   predniSONE 20 MG tablet Commonly known as: Deltasone Take 2 tablets (40 mg total) by mouth daily with breakfast.   ProAir HFA 108 (90 Base) MCG/ACT inhaler Generic drug: albuterol Inhale 2 puffs into  the lungs 4 (four) times daily as needed for wheezing or shortness of breath.   sitaGLIPtin-metformin 50-1000 MG tablet Commonly known as: JANUMET Take 1 tablet by mouth 2 (two) times daily.   spironolactone 25 MG tablet Commonly known as: ALDACTONE TAKE 1/2 TABLET(12.5 MG) BY MOUTH DAILY What changed: See the new instructions.   Trelegy Ellipta 100-62.5-25 MCG/INH Aepb Generic drug: Fluticasone-Umeclidin-Vilant Inhale 1 puff into the lungs daily.   vitamin B-12 1000 MCG tablet Commonly known as: CYANOCOBALAMIN Take 1,500 mcg by mouth every other day.   zinc sulfate 220 (50 Zn) MG capsule Take 1 capsule (220 mg total) by mouth daily. Start taking on: August 15, 2019            Home Infusion Instuctions  (From admission, onward)         Start     Ordered   08/14/19 0000  Home infusion instructions    Question:  Instructions  Answer:  Flushing of vascular access device: 0.9% NaCl pre/post medication administration and prn patency; Heparin 100 u/ml, 20m for implanted ports and Heparin 10u/ml, 59mfor all other central venous catheters.   08/14/19 1508          Major procedures and Radiology Reports - PLEASE review detailed and final reports for all details, in brief -   DG Chest Port 1 View  Result Date: 08/12/2019 CLINICAL DATA:  Shortness of breath. EXAM: PORTABLE CHEST 1 VIEW COMPARISON:  Dec 21, 2015. FINDINGS: Stable cardiomediastinal silhouette. No pneumothorax or pleural effusion is noted. Mild ill-defined opacities are noted peripherally in both lungs, particularly in the right, concerning for multifocal pneumonia. Bony thorax is unremarkable. IMPRESSION: Mild ill-defined opacities are noted peripherally in both lungs, particularly in the right lung, concerning for multifocal pneumonia, particularly of viral etiology. Electronically Signed   By: JaMarijo Conception.D.   On: 08/12/2019 16:03    Micro Results    Recent Results (from the past 240 hour(s))  Blood  Culture (routine x 2)     Status: None (Preliminary result)   Collection Time: 08/12/19  3:45 PM   Specimen: Right Antecubital; Blood  Result Value Ref Range Status   Specimen Description RIGHT ANTECUBITAL  Final   Special Requests   Final    BOTTLES DRAWN AEROBIC AND ANAEROBIC Blood Culture adequate volume   Culture   Final    NO GROWTH 2 DAYS [facility] [place] [address]  Report Status PENDING  Incomplete  Blood Culture (routine x 2)     Status: None (Preliminary result)   Collection Time: 08/12/19  4:18 PM   Specimen: Left Antecubital; Blood  Result Value Ref Range Status   Specimen Description LEFT ANTECUBITAL  Final   Special Requests   Final    BOTTLES DRAWN AEROBIC AND ANAEROBIC Blood Culture adequate volume   Culture   Final    NO GROWTH 2 DAYS [facility] [place] [address]  Report Status PENDING  Incomplete       Today   Subjective    NaMonserrath Juniooday has  no new complaints -Ambulating around without dyspnea on exertion or hypoxia -No dizziness no chest pains no palpitations patient eager to go home          Patient has been seen and examined prior to discharge   Objective   Blood pressure (!) 172/72, pulse 72, temperature 97.7 F (36.5 C), temperature source Oral, resp. rate 18, height 5' 4"  (1.626 m), weight 77.7 kg, SpO2 94 %.   Intake/Output Summary (Last 24 hours) at 08/14/2019 1509 Last data filed at 08/14/2019 1300 Gross per 24 hour  Intake 600 ml  Output --  Net 600 ml    Exam Gen:- Awake Alert, no acute distress , speaking in complete sentences HEENT:- [place].AT, No sclera icterus Neck-Supple Neck,No JVD,.  Lungs-fair symmetrical air movement, mostly clear, no wheezing CV- S1, S2 normal, irregular Abd-  +ve B.Sounds, Abd Soft, No tenderness,    Extremity/Skin:- No  edema,   good pulses Psych-affect is appropriate, oriented x3 Neuro-no new focal deficits, no  tremors    Data Review   CBC w Diff:  Lab Results  Component Value Date   WBC 5.6 08/14/2019   HGB 9.8 (L) 08/14/2019   HCT 30.8 (L) 08/14/2019   PLT 284 08/14/2019   LYMPHOPCT 18 08/14/2019   MONOPCT 6 08/14/2019   EOSPCT 0 08/14/2019   BASOPCT 0 08/14/2019    CMP:  Lab Results  Component Value Date   NA 130 (L) 08/14/2019   NA 132 (L) 03/19/2018   K 4.3 08/14/2019   CL 99 08/14/2019   CO2 20 (L) 08/14/2019   BUN 31 (H) 08/14/2019   BUN 20 03/19/2018   CREATININE 0.94 08/14/2019   CREATININE 1.08 (H) 03/28/2016   PROT 7.8 08/14/2019   ALBUMIN 3.3 (L) 08/14/2019   BILITOT 0.5 08/14/2019   ALKPHOS 43 08/14/2019   AST 24 08/14/2019   ALT 25 08/14/2019  .   Total Discharge time is about 33 minutes  Roxan Hockey M.D on 08/14/2019 at 3:09 PM  Go to [email or web address] -  for contact info  [facility] - Office  [phone]

## 2019-08-14 NOTE — Progress Notes (Signed)
Inpatient Diabetes Program Recommendations  AACE/ADA: New Consensus Statement on Inpatient Glycemic Control   Target Ranges:  Prepandial:   less than 140 mg/dL      Peak postprandial:   less than 180 mg/dL (1-2 hours)      Critically ill patients:  140 - 180 mg/dL   Results for ICIS, BUDREAU (MRN [id number]) as of 08/14/2019 13:51  Ref. Range 08/13/2019 07:44 08/13/2019 11:25 08/13/2019 16:59 08/13/2019 19:50 08/14/2019 07:29 08/14/2019 11:29  Glucose-Capillary Latest Ref Range: 70 - 99 mg/dL 700 (H) 174 (H) 944 (H) 295 (H) 217 (H) 201 (H)   Review of Glycemic Control  Diabetes history: DM2 Outpatient Diabetes medications: Janumet 50-1000 mg BID Current orders for Inpatient glycemic control: Novolog 0-9 units TID with meals; Decadron 6 mg Q24H  Inpatient Diabetes Program Recommendations:  Insulin - Basal: If steroids are continued, please consider ordering Levemir 5 units BID.  Thanks, Orlando Penner, RN, MSN, CDE Diabetes Coordinator Inpatient Diabetes Program [phone] (Team Pager from 8am to 5pm)

## 2019-08-14 NOTE — Progress Notes (Signed)
Patient scheduled for outpatient Remdesivir infusion at 1130AM on Friday 1/15 and Saturday 1/16.  Please advise them to report to [facility] at [address].  Drive to the security guard and tell them you are here for an infusion. They will direct you to the front entrance where we will come and get you.  For questions call [phone].  Thanks

## 2019-08-14 NOTE — Clinical Social Work Note (Signed)
TOC consulted because patient needs PCP. Patient scheduled at [facility] on 08/28/19 @ 1:00 p.m. with Tereasa Coop, NP. Appointment added to AVS.  Patient confirms that her daughter will transport her to her Remdesivir Infusion on Friday and Saturday. Appointments also on AVS.   TOC signing off.    Leslie Kim, Juleen China, LCSW

## 2019-08-14 NOTE — Discharge Instructions (Addendum)
You are scheduled for an outpatient infusion of Remdesivir at 1130AM on Friday 1/15 and Saturday 1/16.  Please report to [facility] at [address]  Drive to the security guard and tell them you are here for an infusion. They will direct you to the front entrance where we will come and get you.  For questions call [phone].  Thanks   1) you are taking Eliquis/apixaban which is your blood thinner so please Avoid ibuprofen/Advil/Aleve/Motrin/Goody Powders/Naproxen/BC powders/Meloxicam/Diclofenac/Indomethacin and other Nonsteroidal anti-inflammatory medications as these will make you more likely to bleed and can cause stomach ulcers, can also cause Kidney problems.   2) You are strongly advised to  to isolate for at least 21 days from the date of your diagnoses with COVID-19 infection--please always wear a mask if you have to go outside the house  3) please use sliding scale insulin as ordered while on prednisone/steroids as your blood sugars will tend to run higher -insulin aspart (novoLOG) injection 0-10 Units  0-10 Units Subcutaneous, 3 times daily with meals  CBG < 70: Implement Hypoglycemia Standing Orders and refer to Hypoglycemia Standing Orders sidebar report   CBG 70 - 120: 0 unit CBG 121 - 150: 0 unit  CBG 151 - 200: 1 unit  CBG 201 - 250: 2 units  CBG 251 - 300: 4 units  CBG 301 - 350: 6 units   CBG 351 - 400: 8 units  CBG > 400: 10 units  4)Patient scheduled for outpatient Remdesivir infusion at 1130AM on Friday 08/15/2019 and Saturday 08/01/2019.  Please advise them to report to [facility] at [address]  Drive to the security guard and tell them you are here for an infusion. They will direct you to the front entrance where we will come and get you.  For questions call [phone]  5) you need repeat BMP/Chem 7  blood test within a week with your primary care physician

## 2019-08-15 ENCOUNTER — Ambulatory Visit (HOSPITAL_COMMUNITY)
Admission: RE | Admit: 2019-08-15 | Discharge: 2019-08-15 | Disposition: A | Discharge status: Home / Self Care | Payer: Medicare Other | Source: Ambulatory Visit | Attending: Pulmonary Disease | Admitting: Pulmonary Disease

## 2019-08-15 VITALS — BP 142/57 | HR 65 | Temp 97.8°F | Resp 18

## 2019-08-15 DIAGNOSIS — J9601 Acute respiratory failure with hypoxia: Secondary | ICD-10-CM | POA: Diagnosis present

## 2019-08-15 DIAGNOSIS — U071 COVID-19: Secondary | ICD-10-CM | POA: Insufficient documentation

## 2019-08-15 MED ORDER — METHYLPREDNISOLONE SODIUM SUCC 125 MG IJ SOLR: 125.0000 mg | Freq: Once | INTRAMUSCULAR | Status: DC | PRN

## 2019-08-15 MED ORDER — SODIUM CHLORIDE 0.9 % IV SOLN
100.0000 mg | Freq: Once | INTRAVENOUS | Status: AC
  Administered 2019-08-15: 100 mg via INTRAVENOUS

## 2019-08-15 MED ORDER — SODIUM CHLORIDE 0.9 % IV SOLN: INTRAVENOUS | Status: DC | PRN

## 2019-08-15 MED ORDER — ALBUTEROL SULFATE HFA 108 (90 BASE) MCG/ACT IN AERS: 2.0000 | INHALATION_SPRAY | Freq: Once | RESPIRATORY_TRACT | Status: DC | PRN

## 2019-08-15 MED ORDER — EPINEPHRINE 0.3 MG/0.3ML IJ SOAJ: 0.3000 mg | Freq: Once | INTRAMUSCULAR | Status: DC | PRN

## 2019-08-15 MED ORDER — DIPHENHYDRAMINE HCL 50 MG/ML IJ SOLN: 50.0000 mg | Freq: Once | INTRAMUSCULAR | Status: DC | PRN

## 2019-08-15 MED ORDER — SODIUM CHLORIDE 0.9 % IV SOLN
INTRAVENOUS | Status: AC
  Filled 2019-08-15: qty 20

## 2019-08-15 MED ORDER — FAMOTIDINE IN NACL 20-0.9 MG/50ML-% IV SOLN: 20.0000 mg | Freq: Once | INTRAVENOUS | Status: DC | PRN

## 2019-08-15 NOTE — Progress Notes (Signed)
  Diagnosis: COVID-19  Physician: Dr. Wright  Procedure: Cov[facility] Infusion Clinic Med: remdesivir infusion.  Complications: No immediate complications noted.  Discharge: Discharged home   Goran Olden 08/15/2019   

## 2019-08-15 NOTE — Discharge Instructions (Signed)
10 Things You Can Do to Manage Your COVID-19 Symptoms at Home If you have possible or confirmed COVID-19: 1. Stay home from work and school. And stay away from other public places. If you must go out, avoid using any kind of public transportation, ridesharing, or taxis. 2. Monitor your symptoms carefully. If your symptoms get worse, call your healthcare provider immediately. 3. Get rest and stay hydrated. 4. If you have a medical appointment, call the healthcare provider ahead of time and tell them that you have or may have COVID-19. 5. For medical emergencies, call 911 and notify the dispatch personnel that you have or may have COVID-19. 6. Cover your cough and sneezes with a tissue or use the inside of your elbow. 7. Wash your hands often with soap and water for at least 20 seconds or clean your hands with an alcohol-based hand sanitizer that contains at least 60% alcohol. 8. As much as possible, stay in a specific room and away from other people in your home. Also, you should use a separate bathroom, if available. If you need to be around other people in or outside of the home, wear a mask. 9. Avoid sharing personal items with other people in your household, like dishes, towels, and bedding. 10. Clean all surfaces that are touched often, like counters, tabletops, and doorknobs. Use household cleaning sprays or wipes according to the label instructions. cdc.gov/coronavirus 01/29/2019 This information is not intended to replace advice given to you by your health care provider. Make sure you discuss any questions you have with your health care provider. Document Revised: 07/03/2019 Document Reviewed: 07/03/2019 Elsevier Patient Education  2020 [employer] Inc.  

## 2019-08-16 ENCOUNTER — Ambulatory Visit (HOSPITAL_COMMUNITY)
Admit: 2019-08-16 | Discharge: 2019-08-16 | Disposition: A | Discharge status: Home / Self Care | Payer: Medicare Other | Attending: Pulmonary Disease | Admitting: Pulmonary Disease

## 2019-08-16 DIAGNOSIS — J9601 Acute respiratory failure with hypoxia: Secondary | ICD-10-CM

## 2019-08-16 DIAGNOSIS — U071 COVID-19: Secondary | ICD-10-CM | POA: Diagnosis not present

## 2019-08-16 MED ORDER — ALBUTEROL SULFATE HFA 108 (90 BASE) MCG/ACT IN AERS: 2.0000 | INHALATION_SPRAY | Freq: Once | RESPIRATORY_TRACT | Status: DC | PRN

## 2019-08-16 MED ORDER — METHYLPREDNISOLONE SODIUM SUCC 125 MG IJ SOLR: 125.0000 mg | Freq: Once | INTRAMUSCULAR | Status: DC | PRN

## 2019-08-16 MED ORDER — SODIUM CHLORIDE 0.9 % IV SOLN
INTRAVENOUS | Status: AC
  Filled 2019-08-16: qty 20

## 2019-08-16 MED ORDER — FAMOTIDINE IN NACL 20-0.9 MG/50ML-% IV SOLN: 20.0000 mg | Freq: Once | INTRAVENOUS | Status: DC | PRN

## 2019-08-16 MED ORDER — EPINEPHRINE 0.3 MG/0.3ML IJ SOAJ: 0.3000 mg | Freq: Once | INTRAMUSCULAR | Status: DC | PRN

## 2019-08-16 MED ORDER — SODIUM CHLORIDE 0.9 % IV SOLN: 100.0000 mg | Freq: Once | INTRAVENOUS | Status: DC

## 2019-08-16 MED ORDER — SODIUM CHLORIDE 0.9 % IV SOLN
INTRAVENOUS | Status: DC | PRN
  Administered 2019-08-16: 12:00:00 250 mL via INTRAVENOUS

## 2019-08-16 MED ORDER — DIPHENHYDRAMINE HCL 50 MG/ML IJ SOLN: 50.0000 mg | Freq: Once | INTRAMUSCULAR | Status: DC | PRN

## 2019-08-16 NOTE — Progress Notes (Signed)
  Diagnosis: COVID-19  Physician:dr wright Procedure: [facility] Med: remdesivir infusion.  Complications: No immediate complications noted.  Discharge: Discharged home   Talena Neira S Jadasia Haws 08/16/2019   

## 2019-08-16 NOTE — Discharge Instructions (Signed)
COVID-19 COVID-19 is a respiratory infection that is caused by a virus called severe acute respiratory syndrome coronavirus 2 (SARS-CoV-2). The disease is also known as coronavirus disease or novel coronavirus. In some people, the virus may not cause any symptoms. In others, it may cause a serious infection. The infection can get worse quickly and can lead to complications, such as:  Pneumonia, or infection of the lungs.  Acute respiratory distress syndrome or ARDS. This is a condition in which fluid build-up in the lungs prevents the lungs from filling with air and passing oxygen into the blood.  Acute respiratory failure. This is a condition in which there is not enough oxygen passing from the lungs to the body or when carbon dioxide is not passing from the lungs out of the body.  Sepsis or septic shock. This is a serious bodily reaction to an infection.  Blood clotting problems.  Secondary infections due to bacteria or fungus.  Organ failure. This is when your body's organs stop working. The virus that causes COVID-19 is contagious. This means that it can spread from person to person through droplets from coughs and sneezes (respiratory secretions). What are the causes? This illness is caused by a virus. You may catch the virus by:  Breathing in droplets from an infected person. Droplets can be spread by a person breathing, speaking, singing, coughing, or sneezing.  Touching something, like a table or a doorknob, that was exposed to the virus (contaminated) and then touching your mouth, nose, or eyes. What increases the risk? Risk for infection You are more likely to be infected with this virus if you:  Are within 6 feet (2 meters) of a person with COVID-19.  Provide care for or live with a person who is infected with COVID-19.  Spend time in crowded indoor spaces or live in shared housing. Risk for serious illness You are more likely to become seriously ill from the virus if  you:  Are 50 years of age or older. The higher your age, the more you are at risk for serious illness.  Live in a nursing home or long-term care facility.  Have cancer.  Have a long-term (chronic) disease such as: ? Chronic lung disease, including chronic obstructive pulmonary disease or asthma. ? A long-term disease that lowers your body's ability to fight infection (immunocompromised). ? Heart disease, including heart failure, a condition in which the arteries that lead to the heart become narrow or blocked (coronary artery disease), a disease which makes the heart muscle thick, weak, or stiff (cardiomyopathy). ? Diabetes. ? Chronic kidney disease. ? Sickle cell disease, a condition in which red blood cells have an abnormal "sickle" shape. ? Liver disease.  Are obese. What are the signs or symptoms? Symptoms of this condition can range from mild to severe. Symptoms may appear any time from 2 to 14 days after being exposed to the virus. They include:  A fever or chills.  A cough.  Difficulty breathing.  Headaches, body aches, or muscle aches.  Runny or stuffy (congested) nose.  A sore throat.  New loss of taste or smell. Some people may also have stomach problems, such as nausea, vomiting, or diarrhea. Other people may not have any symptoms of COVID-19. How is this diagnosed? This condition may be diagnosed based on:  Your signs and symptoms, especially if: ? You live in an area with a COVID-19 outbreak. ? You recently traveled to or from an area where the virus is common. ? You   provide care for or live with a person who was diagnosed with COVID-19. ? You were exposed to a person who was diagnosed with COVID-19.  A physical exam.  Lab tests, which may include: ? Taking a sample of fluid from the back of your nose and throat (nasopharyngeal fluid), your nose, or your throat using a swab. ? A sample of mucus from your lungs (sputum). ? Blood tests.  Imaging tests,  which may include, X-rays, CT scan, or ultrasound. How is this treated? At present, there is no medicine to treat COVID-19. Medicines that treat other diseases are being used on a trial basis to see if they are effective against COVID-19. Your health care provider will talk with you about ways to treat your symptoms. For most people, the infection is mild and can be managed at home with rest, fluids, and over-the-counter medicines. Treatment for a serious infection usually takes places in a hospital intensive care unit (ICU). It may include one or more of the following treatments. These treatments are given until your symptoms improve.  Receiving fluids and medicines through an IV.  Supplemental oxygen. Extra oxygen is given through a tube in the nose, a face mask, or a hood.  Positioning you to lie on your stomach (prone position). This makes it easier for oxygen to get into the lungs.  Continuous positive airway pressure (CPAP) or bi-level positive airway pressure (BPAP) machine. This treatment uses mild air pressure to keep the airways open. A tube that is connected to a motor delivers oxygen to the body.  Ventilator. This treatment moves air into and out of the lungs by using a tube that is placed in your windpipe.  Tracheostomy. This is a procedure to create a hole in the neck so that a breathing tube can be inserted.  Extracorporeal membrane oxygenation (ECMO). This procedure gives the lungs a chance to recover by taking over the functions of the heart and lungs. It supplies oxygen to the body and removes carbon dioxide. Follow these instructions at home: Lifestyle  If you are sick, stay home except to get medical care. Your health care provider will tell you how long to stay home. Call your health care provider before you go for medical care.  Rest at home as told by your health care provider.  Do not use any products that contain nicotine or tobacco, such as cigarettes,  e-cigarettes, and chewing tobacco. If you need help quitting, ask your health care provider.  Return to your normal activities as told by your health care provider. Ask your health care provider what activities are safe for you. General instructions  Take over-the-counter and prescription medicines only as told by your health care provider.  Drink enough fluid to keep your urine pale yellow.  Keep all follow-up visits as told by your health care provider. This is important. How is this prevented?  There is no vaccine to help prevent COVID-19 infection. However, there are steps you can take to protect yourself and others from this virus. To protect yourself:   Do not travel to areas where COVID-19 is a risk. The areas where COVID-19 is reported change often. To identify high-risk areas and travel restrictions, check the CDC travel website: wwwnc.cdc.gov/travel/notices  If you live in, or must travel to, an area where COVID-19 is a risk, take precautions to avoid infection. ? Stay away from people who are sick. ? Wash your hands often with soap and water for 20 seconds. If soap and water   are not available, use an alcohol-based hand sanitizer. ? Avoid touching your mouth, face, eyes, or nose. ? Avoid going out in public, follow guidance from your state and local health authorities. ? If you must go out in public, wear a cloth face covering or face mask. Make sure your mask covers your nose and mouth. ? Avoid crowded indoor spaces. Stay at least 6 feet (2 meters) away from others. ? Disinfect objects and surfaces that are frequently touched every day. This may include:  Counters and tables.  Doorknobs and light switches.  Sinks and faucets.  Electronics, such as phones, remote controls, keyboards, computers, and tablets. To protect others: If you have symptoms of COVID-19, take steps to prevent the virus from spreading to others.  If you think you have a COVID-19 infection, contact  your health care provider right away. Tell your health care team that you think you may have a COVID-19 infection.  Stay home. Leave your house only to seek medical care. Do not use public transport.  Do not travel while you are sick.  Wash your hands often with soap and water for 20 seconds. If soap and water are not available, use alcohol-based hand sanitizer.  Stay away from other members of your household. Let healthy household members care for children and pets, if possible. If you have to care for children or pets, wash your hands often and wear a mask. If possible, stay in your own room, separate from others. Use a different bathroom.  Make sure that all people in your household wash their hands well and often.  Cough or sneeze into a tissue or your sleeve or elbow. Do not cough or sneeze into your hand or into the air.  Wear a cloth face covering or face mask. Make sure your mask covers your nose and mouth. Where to find more information  Centers for Disease Control and Prevention: www.cdc.gov/coronavirus/2019-ncov/index.html  World Health Organization: www.who.int/health-topics/coronavirus Contact a health care provider if:  You live in or have traveled to an area where COVID-19 is a risk and you have symptoms of the infection.  You have had contact with someone who has COVID-19 and you have symptoms of the infection. Get help right away if:  You have trouble breathing.  You have pain or pressure in your chest.  You have confusion.  You have bluish lips and fingernails.  You have difficulty waking from sleep.  You have symptoms that get worse. These symptoms may represent a serious problem that is an emergency. Do not wait to see if the symptoms will go away. Get medical help right away. Call your local emergency services (911 in the [place] Do not drive yourself to the hospital. Let the emergency medical personnel know if you think you have  COVID-19. Summary  COVID-19 is a respiratory infection that is caused by a virus. It is also known as coronavirus disease or novel coronavirus. It can cause serious infections, such as pneumonia, acute respiratory distress syndrome, acute respiratory failure, or sepsis.  The virus that causes COVID-19 is contagious. This means that it can spread from person to person through droplets from breathing, speaking, singing, coughing, or sneezing.  You are more likely to develop a serious illness if you are 50 years of age or older, have a weak immune system, live in a nursing home, or have chronic disease.  There is no medicine to treat COVID-19. Your health care provider will talk with you about ways to treat your symptoms.    Take steps to protect yourself and others from infection. Wash your hands often and disinfect objects and surfaces that are frequently touched every day. Stay away from people who are sick and wear a mask if you are sick. This information is not intended to replace advice given to you by your health care provider. Make sure you discuss any questions you have with your health care provider. Document Revised: 05/16/2019 Document Reviewed: 08/22/2018 Elsevier Patient Education  2020 [employer]. What types of side effects do monoclonal antibody drugs cause?  Common side effects  In general, the more common side effects caused by monoclonal antibody drugs include: . Allergic reactions, such as hives or itching . Flu-like signs and symptoms, including chills, fatigue, fever, and muscle aches and pains . Nausea, vomiting . Diarrhea . Skin rashes . Low blood pressure   The CDC is recommending patients who receive monoclonal antibody treatments wait at least 90 days before being vaccinated.  Currently, there are no data on the safety and efficacy of mRNA COVID-19 vaccines in persons who received monoclonal antibodies or convalescent plasma as part of COVID-19 treatment. Based  on the estimated half-life of such therapies as well as evidence suggesting that reinfection is uncommon in the 90 days after initial infection, vaccination should be deferred for at least 90 days, as a precautionary measure until additional information becomes available, to avoid interference of the antibody treatment with vaccine-induced immune responses. 

## 2019-08-18 LAB — CULTURE, BLOOD (ROUTINE X 2)
Culture: NO GROWTH
Culture: NO GROWTH
Special Requests: ADEQUATE
Special Requests: ADEQUATE

## 2019-08-28 ENCOUNTER — Other Ambulatory Visit: Payer: Self-pay

## 2019-08-28 ENCOUNTER — Ambulatory Visit: Payer: Medicare Other | Admitting: Family Medicine

## 2019-09-10 ENCOUNTER — Encounter: Payer: Medicare Other | Admitting: Family Medicine

## 2019-09-10 ENCOUNTER — Other Ambulatory Visit: Payer: Self-pay

## 2019-09-10 ENCOUNTER — Encounter: Payer: Self-pay | Admitting: Family Medicine

## 2019-09-10 NOTE — Progress Notes (Signed)
This encounter was created in error - please disregard.

## 2019-09-10 NOTE — Progress Notes (Deleted)
Subjective:  Patient ID: Leslie Kim, female    DOB: [birth year]  Age: 84 y.o. MRN: [id number]  CC:  Chief Complaint  Patient presents with  . New Patient (Initial Visit)    establish care      HPI  HPI  Today patient denies signs and symptoms of COVID 19 infection including fever, chills, cough, shortness of breath, and headache. Past Medical, Surgical, Social History, Allergies, and Medications have been Reviewed.   Past Medical History:  Diagnosis Date  . Essential hypertension   . New onset atrial fibrillation (HCC) 11/2015   a. s/p DCCV on 12/18/2015 b. started on Eliquis  . Nonischemic cardiomyopathy (HCC)    a. 11/2015: echo showing EF of 30-35%, cath with nonobstructive dz  . Type 2 diabetes mellitus (HCC)     Current Meds  Medication Sig  . acetaminophen (TYLENOL) 500 MG tablet Take 1,000 mg by mouth every 6 (six) hours as needed. Pain  . apixaban (ELIQUIS) 5 MG TABS tablet Take 1 tablet (5 mg total) by mouth 2 (two) times daily.  Marland Kitchen ascorbic acid (VITAMIN C) 500 MG tablet Take 1 tablet (500 mg total) by mouth daily.  Marland Kitchen b complex vitamins tablet Take 1 tablet by mouth every other day.  . carvedilol (COREG) 6.25 MG tablet Take 1 tablet (6.25 mg total) by mouth 2 (two) times daily with a meal.  . Cranberry 500 MG CAPS Take 1 capsule by mouth daily.  Marland Kitchen esomeprazole (NEXIUM) 20 MG capsule Take 20 mg by mouth daily.   . furosemide (LASIX) 40 MG tablet TAKE 1 TABLET(40 MG) BY MOUTH DAILY (Patient taking differently: Take 40 mg by mouth daily. )  . Glycopyrrolate-Formoterol (BEVESPI AEROSPHERE) 9-4.8 MCG/ACT AERO Inhale 9 mcg into the lungs 2 (two) times daily.  . insulin aspart (NOVOLOG) 100 UNIT/ML FlexPen Inject 0-10 Units into the skin 4 (four) times daily -  before meals and at bedtime. insulin aspart (novoLOG) injection 0-10 Units 0-10 Units Subcutaneous, 3 times daily with meals CBG < 70: Implement Hypoglycemia Standing Orders and refer to Hypoglycemia Standing  Orders sidebar report  CBG 70 - 120: 0 unit CBG 121 - 150: 0 unit  CBG 151 - 200: 1 unit CBG 201 - 250: 2 units CBG 251 - 300: 4 units CBG 301 - 350: 6 units  CBG 351 - 400: 8 units  CBG > 400: 10 units  . Magnesium 250 MG TABS Take 1 tablet by mouth daily.  . Melatonin 3 MG TABS Take 2 tablets (6 mg total) by mouth at bedtime.  . Multiple Vitamin (MULTIVITAMIN WITH MINERALS) TABS tablet Take 1 tablet by mouth daily.  Marland Kitchen nystatin (MYCOSTATIN) 100000 UNIT/ML suspension Take 5 mLs (500,000 Units total) by mouth 4 (four) times daily.  . predniSONE (DELTASONE) 20 MG tablet Take 2 tablets (40 mg total) by mouth daily with breakfast.  . PROAIR HFA 108 (90 Base) MCG/ACT inhaler Inhale 2 puffs into the lungs 4 (four) times daily as needed for wheezing or shortness of breath.   . sitaGLIPtan-metformin (JANUMET) 50-1000 MG per tablet Take 1 tablet by mouth 2 (two) times daily.  Marland Kitchen spironolactone (ALDACTONE) 25 MG tablet TAKE 1/2 TABLET(12.5 MG) BY MOUTH DAILY (Patient taking differently: Take 12.5 mg by mouth daily. )  . TRELEGY ELLIPTA 100-62.5-25 MCG/INH AEPB Inhale 1 puff into the lungs daily.  . vitamin B-12 (CYANOCOBALAMIN) 1000 MCG tablet Take 1,500 mcg by mouth every other day.   . zinc sulfate 220 (  50 Zn) MG capsule Take 1 capsule (220 mg total) by mouth daily.    ROS:  ROS   Objective:   Today's Vitals: BP (!) 154/70   Pulse 95   Temp 97.8 F (36.6 C) (Temporal)   Resp 15   Ht 5\' 4"  (1.626 m)   Wt 167 lb 0.6 oz (75.8 kg)   SpO2 96%   BMI 28.67 kg/m  Vitals with BMI 09/10/2019 08/16/2019 08/16/2019  Height 5\' 4"  - -  Weight 167 lbs 1 oz - -  BMI 44.69 - -  Systolic 507 225 750  Diastolic 70 65 58  Pulse 95 66 71     Physical Exam       Assessment   No diagnosis found.  Tests ordered No orders of the defined types were placed in this encounter.    Plan: Please see assessment and plan per problem list above.   No orders of the defined types were placed in this  encounter.   Patient to follow-up in ***.  Perlie Mayo, NP

## 2019-10-21 ENCOUNTER — Other Ambulatory Visit: Payer: Self-pay

## 2019-10-21 ENCOUNTER — Ambulatory Visit (INDEPENDENT_AMBULATORY_CARE_PROVIDER_SITE_OTHER): Payer: Medicare Other | Admitting: Family Medicine

## 2019-10-21 ENCOUNTER — Encounter: Payer: Self-pay | Admitting: Family Medicine

## 2019-10-21 VITALS — BP 140/71 | HR 85 | Temp 97.6°F | Ht 63.0 in | Wt 170.0 lb

## 2019-10-21 DIAGNOSIS — B370 Candidal stomatitis: Secondary | ICD-10-CM | POA: Insufficient documentation

## 2019-10-21 DIAGNOSIS — I5042 Chronic combined systolic (congestive) and diastolic (congestive) heart failure: Secondary | ICD-10-CM

## 2019-10-21 DIAGNOSIS — I10 Essential (primary) hypertension: Secondary | ICD-10-CM | POA: Diagnosis not present

## 2019-10-21 DIAGNOSIS — E119 Type 2 diabetes mellitus without complications: Secondary | ICD-10-CM | POA: Diagnosis not present

## 2019-10-21 DIAGNOSIS — B372 Candidiasis of skin and nail: Secondary | ICD-10-CM | POA: Insufficient documentation

## 2019-10-21 DIAGNOSIS — Z131 Encounter for screening for diabetes mellitus: Secondary | ICD-10-CM | POA: Insufficient documentation

## 2019-10-21 DIAGNOSIS — J449 Chronic obstructive pulmonary disease, unspecified: Secondary | ICD-10-CM | POA: Diagnosis not present

## 2019-10-21 DIAGNOSIS — K219 Gastro-esophageal reflux disease without esophagitis: Secondary | ICD-10-CM | POA: Insufficient documentation

## 2019-10-21 MED ORDER — SITAGLIPTIN PHOS-METFORMIN HCL 50-1000 MG PO TABS: 1.0000 | ORAL_TABLET | Freq: Two times a day (BID) | ORAL | 1 refills | Status: DC

## 2019-10-21 MED ORDER — CLOTRIMAZOLE 1 % EX CREA: 1.0000 "application " | TOPICAL_CREAM | Freq: Two times a day (BID) | CUTANEOUS | 0 refills | Status: DC

## 2019-10-21 MED ORDER — NYSTATIN 100000 UNIT/ML MT SUSP: 5.0000 mL | Freq: Four times a day (QID) | OROMUCOSAL | 0 refills | Status: DC

## 2019-10-21 NOTE — Patient Instructions (Addendum)
  Fasting labwork Follow up in 1 month Check glucose readings fasting and after largest meal Use nystatin in the mouth on the tongue and gums Use cream on legs for yeast   If you have lab work done today you will be contacted with your lab results within the next 2 weeks.  If you have not heard from [place] then please contact us. The fastest way to get your results is to register for My Chart.   IF you received an x-ray today, you will receive an invoice from [facility]. Please contact [facility] at [phone] with questions or concerns regarding your invoice.   IF you received labwork today, you will receive an invoice from [facility]. Please contact [facility] at [phone] with questions or concerns regarding your invoice.   Our billing staff will not be able to assist you with questions regarding bills from these companies.  You will be contacted with the lab results as soon as they are available. The fastest way to get your results is to activate your My Chart account. Instructions are located on the last page of this paperwork. If you have not heard from [place] regarding the results in 2 weeks, please contact this office.

## 2019-10-21 NOTE — Progress Notes (Signed)
New Patient Office Visit  Subjective:  Patient ID: Leslie Kim, female    DOB: [birth year]  Age: 84 y.o. MRN: [id number]  CC:  Chief Complaint  Patient presents with  . Establish Care     3 month f/u on bp and diabetes    HPI Leslie Kim presents for DM-Janumet BID-A1c 7.8-pt receiving nsulin while hospitalized. Pt checked glucose at home-fasting-100. Pt states she did not want insulin rx for outpt use COVID-diagnosis-pt hospitalized for COVDI 1/21-continue fatigue HTN/afib/CHF-pt "shocked" and went back to normal rhythm -Carvediolol/Lasix/Losartan/Eliquis/Spironlactone-pt did not tolerate Entresto COPD-Trelegy/Proair-quit smoking at 84yo-pt with decreased exercise tolerance Thrush onset after COVID-burning tongue since COVID Assessment and Plan:9/20 cardiology-echo report Echocardiogram 10/01/2017: Study Conclusions - Left ventricle: The cavity size was normal. Wall thickness was increased in a pattern of moderate LVH. Systolic function was moderately reduced. The estimated ejection fraction was in the range of 35% to 40%. Diffuse hypokinesis. Doppler parameters are consistent with abnormal left ventricular relaxation (grade 1 diastolic dysfunction). Doppler parameters are consistent with high ventricular filling pressure. - Aortic valve: Valve area (VTI): 2.84 cm^2. Valve area (Vmax): 2.69 cm^2. Valve area (Vmean): 2.27 cm^2. - Mitral valve: Moderately to severely calcified annulus. Normal thickness leaflets . - Left atrium: The atrium was mildly dilated. - Pulmonary arteries: PA peak pressure: 35 mm Hg (S). PASP is borderline elevated. - Technically difficult study.  1.  Nonischemic cardiomyopathy with chronic combined heart failure.  Last LVEF 35 to 40% range by echocardiogram in March 2019.  She appears euvolemic and has been tolerating present medical therapy.  No changes were made today.  Follow-up echocardiogram.  2.  History of atrial  fibrillation, no palpitations and maintaining sinus rhythm by ECG today.  Requesting interval lab work from Dr. Juanetta Gosling.  She reports no bleeding problems on Eliquis.  3.  Essential hypertension, blood pressure is mildly elevated today.  She reports compliance with her medications.  We discussed sodium restriction and continued follow-up with Dr. Juanetta Gosling.  Past Medical History:  Diagnosis Date  . Allergy   . Cataract   . CHF (congestive heart failure) (HCC)   . COPD (chronic obstructive pulmonary disease) (HCC)   . Essential hypertension   . New onset atrial fibrillation (HCC) 11/2015   a. s/p DCCV on 12/18/2015 b. started on Eliquis  . Nonischemic cardiomyopathy (HCC)    a. 11/2015: echo showing EF of 30-35%, cath with nonobstructive dz  . Type 2 diabetes mellitus (HCC)     Past Surgical History:  Procedure Laterality Date  . CARDIAC CATHETERIZATION N/A 12/23/2015   Procedure: Right/Left Heart Cath and Coronary Angiography;  Surgeon: Laurey Morale, MD;  Location: [facility] INVASIVE CV LAB;  Service: Cardiovascular;  Laterality: N/A;  . CHOLECYSTECTOMY      Family History  Problem Relation Age of Onset  . Diabetes Mother   . Diabetes Father   . Cancer Father   . Diabetes Brother   . Diabetes Brother   . Diabetes Brother   . Diabetes Brother   . Diabetes Brother     Social History   Socioeconomic History  . Marital status: Married    Spouse name: Not on file  . Number of children: Not on file  . Years of education: Not on file  . Highest education level: Not on file  Occupational History  . Not on file  Tobacco Use  . Smoking status: Former Smoker    Types: Cigarettes    Quit date: 12/18/2007  Years since quitting: 11.8  . Smokeless tobacco: Never Used  . Tobacco comment: Quit for 8 years  Substance and Sexual Activity  . Alcohol use: No    Alcohol/week: 0.0 standard drinks  . Drug use: No  . Sexual activity: Yes  Other Topics Concern  . Not on file  Social  History Narrative  . Not on file   Social Determinants of Health   Financial Resource Strain:   . Difficulty of Paying Living Expenses:   Food Insecurity:   . Worried About Charity fundraiser in the Last Year:   . Arboriculturist in the Last Year:   Transportation Needs:   . Film/video editor (Medical):   Marland Kitchen Lack of Transportation (Non-Medical):   Physical Activity:   . Days of Exercise per Week:   . Minutes of Exercise per Session:   Stress:   . Feeling of Stress :   Social Connections:   . Frequency of Communication with Friends and Family:   . Frequency of Social Gatherings with Friends and Family:   . Attends Religious Services:   . Active Member of Clubs or Organizations:   . Attends Archivist Meetings:   Marland Kitchen Marital Status:   Intimate Partner Violence:   . Fear of Current or Ex-Partner:   . Emotionally Abused:   Marland Kitchen Physically Abused:   . Sexually Abused:     ROS Review of Systems  Constitutional: Negative.   HENT: Negative.   Eyes:       Cataracts  Respiratory:       COPD-h/o COVID with hospital admission- 1/21  Gastrointestinal:       GERD-Nexium  Endocrine:       DM   Genitourinary: Negative.   Musculoskeletal: Positive for arthralgias.  Allergic/Immunologic: Positive for environmental allergies.  Neurological:       Peripheral neuropathy  Hematological:       Anemia-iron in the past, MV currently  Psychiatric/Behavioral: Negative.     Objective:   Today's Vitals: BP 140/71 (BP Location: Left Arm, Patient Position: Sitting, Cuff Size: Normal)   Pulse 85   Temp 97.6 F (36.4 C) (Temporal)   Ht 5\' 3"  (1.6 m)   Wt 170 lb (77.1 kg)   SpO2 97%   BMI 30.11 kg/m   Physical Exam Constitutional:      Appearance: Normal appearance.  HENT:     Mouth/Throat:     Comments: Thrush -tongue Cardiovascular:     Rate and Rhythm: Normal rate and regular rhythm.     Pulses: Normal pulses.     Heart sounds: Normal heart sounds.  Pulmonary:      Effort: Pulmonary effort is normal.     Breath sounds: Normal breath sounds.  Musculoskeletal:     Cervical back: Normal range of motion and neck supple.  Neurological:     Mental Status: She is alert and oriented to person, place, and time.  Psychiatric:        Mood and Affect: Mood normal.        Behavior: Behavior normal.     Assessment & Plan:   1. Type 2 diabetes mellitus without complication, without long-term current use of insulin (HCC)-no regular glucose monitoring, took insulin while hospitalized-none since outpt - sitaGLIPtin-metformin (JANUMET) 50-1000 MG tablet; Take 1 tablet by mouth 2 (two) times daily.  Dispense: 180 tablet; Refill: 1 - Hemoglobin A1c - Lipid panel  2. Essential hypertension Carvediolol/Losartan-stable - COMPLETE METABOLIC PANEL WITH GFR -  CBC w/Diff/Platelet - TSH - Lipid panel - Urinalysis H/o afib-eliquis  3. Chronic combined systolic (congestive) and diastolic (congestive) heart failure (HCC) Lasix/spironolactone-stable-no recent weight gain-admitted for COVID - CBC w/Diff/Platelet  4. Chronic obstructive pulmonary disease, unspecified COPD type (HCC) Stable on Trelegy qd and albuterol BID  5. Gastroesophageal reflux disease without esophagitis nexium daily--no current symptoms   6. Candidal dermatitis Clotrimazole-rx  7. Thrush nystatin  Outpatient Encounter Medications as of 10/21/2019  Medication Sig  . acetaminophen (TYLENOL) 500 MG tablet Take 1,000 mg by mouth every 6 (six) hours as needed. Pain  . apixaban (ELIQUIS) 5 MG TABS tablet Take 1 tablet (5 mg total) by mouth 2 (two) times daily.  Marland Kitchen ascorbic acid (VITAMIN C) 500 MG tablet Take 1 tablet (500 mg total) by mouth daily.  Marland Kitchen b complex vitamins tablet Take 1 tablet by mouth every other day.  . carvedilol (COREG) 6.25 MG tablet Take 1 tablet (6.25 mg total) by mouth 2 (two) times daily with a meal.  . esomeprazole (NEXIUM) 20 MG capsule Take 20 mg by mouth daily.   .  furosemide (LASIX) 40 MG tablet TAKE 1 TABLET(40 MG) BY MOUTH DAILY (Patient taking differently: Take 40 mg by mouth daily. )  . Glycopyrrolate-Formoterol (BEVESPI AEROSPHERE) 9-4.8 MCG/ACT AERO Inhale 9 mcg into the lungs 2 (two) times daily.  . insulin aspart (NOVOLOG) 100 UNIT/ML FlexPen Inject 0-10 Units into the skin 4 (four) times daily -  before meals and at bedtime. insulin aspart (novoLOG) injection 0-10 Units 0-10 Units Subcutaneous, 3 times daily with meals CBG < 70: Implement Hypoglycemia Standing Orders and refer to Hypoglycemia Standing Orders sidebar report  CBG 70 - 120: 0 unit CBG 121 - 150: 0 unit  CBG 151 - 200: 1 unit CBG 201 - 250: 2 units CBG 251 - 300: 4 units CBG 301 - 350: 6 units  CBG 351 - 400: 8 units  CBG > 400: 10 units  . Melatonin 3 MG TABS Take 2 tablets (6 mg total) by mouth at bedtime.  . Multiple Vitamin (MULTIVITAMIN WITH MINERALS) TABS tablet Take 1 tablet by mouth daily.  Marland Kitchen PROAIR HFA 108 (90 Base) MCG/ACT inhaler Inhale 2 puffs into the lungs 4 (four) times daily as needed for wheezing or shortness of breath.   . sitaGLIPtan-metformin (JANUMET) 50-1000 MG per tablet Take 1 tablet by mouth 2 (two) times daily.  Marland Kitchen spironolactone (ALDACTONE) 25 MG tablet TAKE 1/2 TABLET(12.5 MG) BY MOUTH DAILY (Patient taking differently: Take 12.5 mg by mouth daily. )  . TRELEGY ELLIPTA 100-62.5-25 MCG/INH AEPB Inhale 1 puff into the lungs daily.  . vitamin B-12 (CYANOCOBALAMIN) 1000 MCG tablet Take 1,500 mcg by mouth every other day.   . losartan (COZAAR) 50 MG tablet Take 1 tablet (50 mg total) by mouth 2 (two) times daily.  . [DISCONTINUED] Cranberry 500 MG CAPS Take 1 capsule by mouth daily.  . [DISCONTINUED] Magnesium 250 MG TABS Take 1 tablet by mouth daily.  . [DISCONTINUED] nystatin (MYCOSTATIN) 100000 UNIT/ML suspension Take 5 mLs (500,000 Units total) by mouth 4 (four) times daily.  . [DISCONTINUED] predniSONE (DELTASONE) 20 MG tablet Take 2 tablets (40 mg total) by mouth  daily with breakfast.  . [DISCONTINUED] zinc sulfate 220 (50 Zn) MG capsule Take 1 capsule (220 mg total) by mouth daily.   No facility-administered encounter medications on file as of 10/21/2019.   Follow-up: labwork-fasting 65 minutes reviewing old records, hospital records, history from patient, physical exam, assessment and plan  Peggye Poon Hannah Beat, MD

## 2019-11-12 ENCOUNTER — Telehealth: Payer: Self-pay | Admitting: Cardiology

## 2019-11-12 MED ORDER — SPIRONOLACTONE 25 MG PO TABS: ORAL_TABLET | ORAL | 3 refills | Status: DC

## 2019-11-12 MED ORDER — FUROSEMIDE 40 MG PO TABS: ORAL_TABLET | ORAL | 3 refills | Status: DC

## 2019-11-12 MED ORDER — CARVEDILOL 6.25 MG PO TABS: 6.2500 mg | ORAL_TABLET | Freq: Two times a day (BID) | ORAL | 3 refills | Status: DC

## 2019-11-12 MED ORDER — APIXABAN 5 MG PO TABS: 5.0000 mg | ORAL_TABLET | Freq: Two times a day (BID) | ORAL | 6 refills | Status: DC

## 2019-11-12 MED ORDER — LOSARTAN POTASSIUM 50 MG PO TABS: 50.0000 mg | ORAL_TABLET | Freq: Two times a day (BID) | ORAL | 3 refills | Status: DC

## 2019-11-12 NOTE — Telephone Encounter (Signed)
Pt called stating she's changed her pharmacy to [facility] and she's needing her Rx's sent there w/ refills  furosemide (LASIX) 40 MG tablet [[id number]]   carvedilol (COREG) 6.25 MG tablet [[id number]]   spironolactone (ALDACTONE) 25 MG tablet [[id number]]   losartan (COZAAR) 50 MG tablet [[id number]] ENDED  apixaban (ELIQUIS) 5 MG TABS tablet [[id number]]

## 2019-11-12 NOTE — Telephone Encounter (Signed)
Refills sent to [facility].

## 2019-11-20 ENCOUNTER — Other Ambulatory Visit: Payer: Self-pay | Admitting: Family Medicine

## 2019-11-20 DIAGNOSIS — I10 Essential (primary) hypertension: Secondary | ICD-10-CM | POA: Diagnosis not present

## 2019-11-20 DIAGNOSIS — D649 Anemia, unspecified: Secondary | ICD-10-CM | POA: Diagnosis not present

## 2019-11-20 DIAGNOSIS — E119 Type 2 diabetes mellitus without complications: Secondary | ICD-10-CM | POA: Diagnosis not present

## 2019-11-20 DIAGNOSIS — D508 Other iron deficiency anemias: Secondary | ICD-10-CM | POA: Diagnosis not present

## 2019-11-21 LAB — CBC/DIFF AMBIGUOUS DEFAULT
Basophils Absolute: 0 10*3/uL (ref 0.0–0.2)
Basos: 0 %
EOS (ABSOLUTE): 0.1 10*3/uL (ref 0.0–0.4)
Eos: 1 %
Hematocrit: 30.1 % — ABNORMAL LOW (ref 34.0–46.6)
Hemoglobin: 9.4 g/dL — ABNORMAL LOW (ref 11.1–15.9)
Immature Grans (Abs): 0 10*3/uL (ref 0.0–0.1)
Immature Granulocytes: 0 %
Lymphocytes Absolute: 3.9 10*3/uL — ABNORMAL HIGH (ref 0.7–3.1)
Lymphs: 33 %
MCH: 23.6 pg — ABNORMAL LOW (ref 26.6–33.0)
MCHC: 31.2 g/dL — ABNORMAL LOW (ref 31.5–35.7)
MCV: 75 fL — ABNORMAL LOW (ref 79–97)
Monocytes Absolute: 0.8 10*3/uL (ref 0.1–0.9)
Monocytes: 7 %
Neutrophils Absolute: 7.1 10*3/uL — ABNORMAL HIGH (ref 1.4–7.0)
Neutrophils: 59 %
Platelets: 358 10*3/uL (ref 150–450)
RBC: 3.99 x10E6/uL (ref 3.77–5.28)
RDW: 15.4 % (ref 11.7–15.4)
WBC: 12 10*3/uL — ABNORMAL HIGH (ref 3.4–10.8)

## 2019-11-21 LAB — URINALYSIS, ROUTINE W REFLEX MICROSCOPIC

## 2019-11-21 LAB — COMPREHENSIVE METABOLIC PANEL
ALT: 15 IU/L (ref 0–32)
AST: 19 IU/L (ref 0–40)
Albumin/Globulin Ratio: 1.4 (ref 1.2–2.2)
Albumin: 4.5 g/dL (ref 3.6–4.6)
Alkaline Phosphatase: 62 IU/L (ref 39–117)
BUN/Creatinine Ratio: 19 (ref 12–28)
BUN: 21 mg/dL (ref 8–27)
Bilirubin Total: <0.2 mg/dL (ref 0.0–1.2)
CO2: 24 mmol/L (ref 20–29)
Calcium: 9.9 mg/dL (ref 8.7–10.3)
Chloride: 91 mmol/L — ABNORMAL LOW (ref 96–106)
Creatinine, Ser: 1.13 mg/dL — ABNORMAL HIGH (ref 0.57–1.00)
GFR calc Af Amer: 53 mL/min/{1.73_m2} — ABNORMAL LOW (ref >59)
GFR calc non Af Amer: 46 mL/min/{1.73_m2} — ABNORMAL LOW (ref >59)
Globulin, Total: 3.3 g/dL (ref 1.5–4.5)
Glucose: 101 mg/dL — ABNORMAL HIGH (ref 65–99)
Potassium: 4.6 mmol/L (ref 3.5–5.2)
Sodium: 132 mmol/L — ABNORMAL LOW (ref 134–144)
Total Protein: 7.8 g/dL (ref 6.0–8.5)

## 2019-11-21 LAB — HGB A1C W/O EAG: Hgb A1c MFr Bld: 7.2 % — ABNORMAL HIGH (ref 4.8–5.6)

## 2019-11-21 LAB — SPECIMEN STATUS REPORT

## 2019-11-21 LAB — TSH: TSH: 3.46 u[IU]/mL (ref 0.450–4.500)

## 2019-11-26 ENCOUNTER — Ambulatory Visit (INDEPENDENT_AMBULATORY_CARE_PROVIDER_SITE_OTHER): Payer: Medicare Other | Admitting: Family Medicine

## 2019-11-26 ENCOUNTER — Other Ambulatory Visit: Payer: Self-pay | Admitting: Family Medicine

## 2019-11-26 ENCOUNTER — Other Ambulatory Visit: Payer: Self-pay

## 2019-11-26 ENCOUNTER — Encounter: Payer: Self-pay | Admitting: Family Medicine

## 2019-11-26 VITALS — BP 137/79 | HR 87 | Temp 97.8°F | Ht 63.0 in | Wt 169.2 lb

## 2019-11-26 DIAGNOSIS — D649 Anemia, unspecified: Secondary | ICD-10-CM

## 2019-11-26 DIAGNOSIS — J449 Chronic obstructive pulmonary disease, unspecified: Secondary | ICD-10-CM | POA: Diagnosis not present

## 2019-11-26 DIAGNOSIS — B370 Candidal stomatitis: Secondary | ICD-10-CM

## 2019-11-26 DIAGNOSIS — R8290 Unspecified abnormal findings in urine: Secondary | ICD-10-CM | POA: Diagnosis not present

## 2019-11-26 DIAGNOSIS — I5042 Chronic combined systolic (congestive) and diastolic (congestive) heart failure: Secondary | ICD-10-CM | POA: Diagnosis not present

## 2019-11-26 DIAGNOSIS — R9389 Abnormal findings on diagnostic imaging of other specified body structures: Secondary | ICD-10-CM | POA: Insufficient documentation

## 2019-11-26 DIAGNOSIS — I10 Essential (primary) hypertension: Secondary | ICD-10-CM

## 2019-11-26 DIAGNOSIS — E119 Type 2 diabetes mellitus without complications: Secondary | ICD-10-CM

## 2019-11-26 DIAGNOSIS — K219 Gastro-esophageal reflux disease without esophagitis: Secondary | ICD-10-CM

## 2019-11-26 LAB — POCT URINALYSIS DIP (MANUAL ENTRY)
Bilirubin, UA: NEGATIVE
Glucose, UA: NEGATIVE mg/dL
Ketones, POC UA: NEGATIVE mg/dL
Nitrite, UA: POSITIVE — AB
Protein Ur, POC: NEGATIVE mg/dL
Spec Grav, UA: 1.01 (ref 1.010–1.025)
Urobilinogen, UA: 0.2 E.U./dL
pH, UA: 6 (ref 5.0–8.0)

## 2019-11-26 MED ORDER — NYSTATIN 100000 UNIT/ML MT SUSP: 5.0000 mL | Freq: Four times a day (QID) | OROMUCOSAL | 0 refills | Status: DC

## 2019-11-26 MED ORDER — AMOXICILLIN 250 MG PO CAPS
250.0000 mg | ORAL_CAPSULE | Freq: Two times a day (BID) | ORAL | 0 refills | Status: AC
Start: 2019-11-26 — End: 2019-12-01

## 2019-11-26 NOTE — Patient Instructions (Addendum)
  3 months-recheck labwork Chest xray urinalysis   If you have lab work done today you will be contacted with your lab results within the next 2 weeks.  If you have not heard from [place] then please contact us. The fastest way to get your results is to register for My Chart.   IF you received an x-ray today, you will receive an invoice from [facility]. Please contact [facility] at [phone] with questions or concerns regarding your invoice.   IF you received labwork today, you will receive an invoice from [place]. Please contact [facility] at [phone] with questions or concerns regarding your invoice.   Our billing staff will not be able to assist you with questions regarding bills from these companies.  You will be contacted with the lab results as soon as they are available. The fastest way to get your results is to activate your My Chart account. Instructions are located on the last page of this paperwork. If you have not heard from [place] regarding the results in 2 weeks, please contact this office.

## 2019-11-26 NOTE — Addendum Note (Signed)
Addended by: Wandra Feinstein on: 11/26/2019 03:23 PM   Modules accepted: Orders

## 2019-11-26 NOTE — Progress Notes (Signed)
Established Patient Office Visit  Subjective:  Patient ID: Leslie Kim, female    DOB: [birth year]  Age: 84 y.o. MRN: [id number]  CC:  Chief Complaint  Patient presents with  . Diabetes    1 month f/u    HPI Leslie Kim presents for DM-A1c 7.2%-Jaumet Cough-COVID in Jan 12/21 Anemia in the past-pt took iron in the past-vitamins daily-no evaluation-no shots in the past Elevated WBC-no f/u cxr since COVID, pt could not provide urine specimen in lab CHF-no CP/NO LE edema HTN-takes Lasix/spirnolactone/Coreg/Cozaar-pt is not checking blood pressure-reviewed blood work Past Medical History:  Diagnosis Date  . Allergy   . Cataract   . CHF (congestive heart failure) (HCC)   . COPD (chronic obstructive pulmonary disease) (HCC)   . Essential hypertension   . New onset atrial fibrillation (HCC) 11/2015   a. s/p DCCV on 12/18/2015 b. started on Eliquis  . Nonischemic cardiomyopathy (HCC)    a. 11/2015: echo showing EF of 30-35%, cath with nonobstructive dz  . Type 2 diabetes mellitus (HCC)     Past Surgical History:  Procedure Laterality Date  . CARDIAC CATHETERIZATION N/A 12/23/2015   Procedure: Right/Left Heart Cath and Coronary Angiography;  Surgeon: Laurey Morale, MD;  Location: [facility] INVASIVE CV LAB;  Service: Cardiovascular;  Laterality: N/A;  . CHOLECYSTECTOMY      Family History  Problem Relation Age of Onset  . Diabetes Mother   . Diabetes Father   . Cancer Father   . Diabetes Brother   . Diabetes Brother   . Diabetes Brother   . Diabetes Brother   . Diabetes Brother     Social History   Socioeconomic History  . Marital status: Married    Spouse name: Not on file  . Number of children: Not on file  . Years of education: Not on file  . Highest education level: Not on file  Occupational History  . Not on file  Tobacco Use  . Smoking status: Former Smoker    Types: Cigarettes    Quit date: 12/18/2007    Years since quitting: 11.9  . Smokeless tobacco:  Never Used  . Tobacco comment: Quit for 8 years  Substance and Sexual Activity  . Alcohol use: No    Alcohol/week: 0.0 standard drinks  . Drug use: No  . Sexual activity: Yes  Other Topics Concern  . Not on file  Social History Narrative  . Not on file   Social Determinants of Health   Financial Resource Strain:   . Difficulty of Paying Living Expenses:   Food Insecurity:   . Worried About Programme researcher, broadcasting/film/video in the Last Year:   . Barista in the Last Year:   Transportation Needs:   . Freight forwarder (Medical):   Marland Kitchen Lack of Transportation (Non-Medical):   Physical Activity:   . Days of Exercise per Week:   . Minutes of Exercise per Session:   Stress:   . Feeling of Stress :   Social Connections:   . Frequency of Communication with Friends and Family:   . Frequency of Social Gatherings with Friends and Family:   . Attends Religious Services:   . Active Member of Clubs or Organizations:   . Attends Banker Meetings:   Marland Kitchen Marital Status:   Intimate Partner Violence:   . Fear of Current or Ex-Partner:   . Emotionally Abused:   Marland Kitchen Physically Abused:   . Sexually Abused:  Outpatient Medications Prior to Visit  Medication Sig Dispense Refill  . acetaminophen (TYLENOL) 500 MG tablet Take 1,000 mg by mouth every 6 (six) hours as needed. Pain    . apixaban (ELIQUIS) 5 MG TABS tablet Take 1 tablet (5 mg total) by mouth 2 (two) times daily. 60 tablet 6  . ascorbic acid (VITAMIN C) 500 MG tablet Take 1 tablet (500 mg total) by mouth daily. 30 tablet 1  . b complex vitamins tablet Take 1 tablet by mouth every other day.    . carvedilol (COREG) 6.25 MG tablet Take 1 tablet (6.25 mg total) by mouth 2 (two) times daily with a meal. 180 tablet 3  . clotrimazole (CLOTRIMAZOLE GRX) 1 % cream Apply 1 application topically 2 (two) times daily. 60 g 0  . esomeprazole (NEXIUM) 20 MG capsule Take 20 mg by mouth daily.     . furosemide (LASIX) 40 MG tablet TAKE 1  TABLET(40 MG) BY MOUTH DAILY 90 tablet 3  . Glycopyrrolate-Formoterol (BEVESPI AEROSPHERE) 9-4.8 MCG/ACT AERO Inhale 9 mcg into the lungs 2 (two) times daily.    . insulin aspart (NOVOLOG) 100 UNIT/ML FlexPen Inject 0-10 Units into the skin 4 (four) times daily -  before meals and at bedtime. insulin aspart (novoLOG) injection 0-10 Units 0-10 Units Subcutaneous, 3 times daily with meals CBG < 70: Implement Hypoglycemia Standing Orders and refer to Hypoglycemia Standing Orders sidebar report  CBG 70 - 120: 0 unit CBG 121 - 150: 0 unit  CBG 151 - 200: 1 unit CBG 201 - 250: 2 units CBG 251 - 300: 4 units CBG 301 - 350: 6 units  CBG 351 - 400: 8 units  CBG > 400: 10 units 15 mL 0  . losartan (COZAAR) 50 MG tablet Take 1 tablet (50 mg total) by mouth 2 (two) times daily. 180 tablet 3  . Melatonin 3 MG TABS Take 2 tablets (6 mg total) by mouth at bedtime. 30 tablet 1  . Multiple Vitamin (MULTIVITAMIN WITH MINERALS) TABS tablet Take 1 tablet by mouth daily. 30 tablet 0  . nystatin (MYCOSTATIN) 100000 UNIT/ML suspension Take 5 mLs (500,000 Units total) by mouth 4 (four) times daily. Use soft toothbrush to apply to tongue and gums 60 mL 0  . PROAIR HFA 108 (90 Base) MCG/ACT inhaler Inhale 2 puffs into the lungs 4 (four) times daily as needed for wheezing or shortness of breath.   12  . sitaGLIPtin-metformin (JANUMET) 50-1000 MG tablet Take 1 tablet by mouth 2 (two) times daily. 180 tablet 1  . spironolactone (ALDACTONE) 25 MG tablet TAKE 1/2 TABLET(12.5 MG) BY MOUTH DAILY 45 tablet 3  . TRELEGY ELLIPTA 100-62.5-25 MCG/INH AEPB Inhale 1 puff into the lungs daily.  5  . vitamin B-12 (CYANOCOBALAMIN) 1000 MCG tablet Take 1,500 mcg by mouth every other day.      No facility-administered medications prior to visit.    Allergies  Allergen Reactions  . Codeine Other (See Comments)    Too strong for patient      ROS Review of Systems  Constitutional: Negative.   HENT: Negative.   Eyes:       Cataract  surgery  Respiratory:       COPD-Trelegy-concern for thrush-noted since COVID  Cardiovascular:       CHF-last hospitalization -3 years ago-Take lasix every morning  Gastrointestinal: Negative.  Negative for blood in stool.  Genitourinary: Negative.   Allergic/Immunologic: Negative.   Neurological: Negative.   Hematological:  Anemia      Objective:    Physical Exam  Constitutional: She is oriented to person, place, and time. She appears well-developed and well-nourished.  HENT:  Head: Normocephalic and atraumatic.  Cardiovascular: Normal rate.  Pulmonary/Chest: Effort normal and breath sounds normal.  Musculoskeletal:     Cervical back: Normal range of motion and neck supple.  Neurological: She is alert and oriented to person, place, and time.  Psychiatric: She has a normal mood and affect. Her behavior is normal.    BP 137/79 (BP Location: Left Arm, Patient Position: Sitting, Cuff Size: Normal)   Pulse 87   Temp 97.8 F (36.6 C) (Temporal)   Ht 5\' 3"  (1.6 m)   Wt 169 lb 3.2 oz (76.7 kg)   SpO2 95%   BMI 29.97 kg/m  Wt Readings from Last 3 Encounters:  11/26/19 169 lb 3.2 oz (76.7 kg)  10/21/19 170 lb (77.1 kg)  09/10/19 167 lb 0.6 oz (75.8 kg)     Health Maintenance Due  Topic Date Due  . FOOT EXAM  Never done  . OPHTHALMOLOGY EXAM  Never done  . COVID-19 Vaccine (1) Never done  . TETANUS/TDAP  Never done  . DEXA SCAN  Never done  . PNA vac Low Risk Adult (1 of 2 - PCV13) Never done    Lab Results  Component Value Date   TSH 3.460 11/20/2019   Lab Results  Component Value Date   WBC 12.0 (H) 11/20/2019   HGB 9.4 (L) 11/20/2019   HCT 30.1 (L) 11/20/2019   MCV 75 (L) 11/20/2019   PLT 358 11/20/2019   Lab Results  Component Value Date   NA 132 (L) 11/20/2019   K 4.6 11/20/2019   CO2 24 11/20/2019   GLUCOSE 101 (H) 11/20/2019   BUN 21 11/20/2019   CREATININE 1.13 (H) 11/20/2019   BILITOT <0.2 11/20/2019   ALKPHOS 62 11/20/2019   AST 19  11/20/2019   ALT 15 11/20/2019   PROT 7.8 11/20/2019   ALBUMIN 4.5 11/20/2019   CALCIUM 9.9 11/20/2019   ANIONGAP 11 08/14/2019   Lab Results  Component Value Date   CHOL 165 12/21/2015   Lab Results  Component Value Date   HDL 49 12/21/2015   Lab Results  Component Value Date   LDLCALC 97 12/21/2015   Lab Results  Component Value Date   TRIG 146 08/12/2019   Lab Results  Component Value Date   CHOLHDL 3.4 12/21/2015   Lab Results  Component Value Date   HGBA1C 7.2 (H) 11/20/2019      Assessment & Plan:  1. Chronic obstructive pulmonary disease, unspecified COPD type (HCC) Trelegy Ellipta - DG Chest 2 View; Future  2. Chronic combined systolic (congestive) and diastolic (congestive) heart failure (HCC) - DG Chest 2 View; Future  3. Abnormal CXR - DG Chest 2 View; Future Elevated WBC 4. Thrush Nystatin-continue using-pt using Trelegy-thrush after COVID 5. Gastroesophageal reflux disease without esophagitis nexium 6. Essential hypertension Lasix/aldactone/Cozaar-stable- Urine check today 7. Type 2 diabetes mellitus without complication, without long-term current use of insulin (HCC) janumet Follow-up: 3 months-f/u pt taking spironolactone + lasix-low sodium   Shakti Fleer 11/22/2019, MD

## 2019-11-27 ENCOUNTER — Telehealth: Payer: Self-pay | Admitting: Emergency Medicine

## 2019-11-27 LAB — IRON AND TIBC
Iron Saturation: 5 % — CL (ref 15–55)
Iron: 20 ug/dL — ABNORMAL LOW (ref 27–139)
Total Iron Binding Capacity: 399 ug/dL (ref 250–450)
UIBC: 379 ug/dL — ABNORMAL HIGH (ref 118–369)

## 2019-11-27 LAB — SPECIMEN STATUS REPORT

## 2019-11-27 LAB — FERRITIN: Ferritin: 14 ng/mL — ABNORMAL LOW (ref 15–150)

## 2019-11-27 NOTE — Telephone Encounter (Signed)
Patient was informed of her lab result and Abx has been sent into the pharmacy

## 2019-11-27 NOTE — Telephone Encounter (Signed)
-----   Message from Wandra Feinstein, MD sent at 11/26/2019  3:11 PM EDT ----- Amoxil 250mg -take BID for UTI-sent to pharmacy-[place] [facility] pending

## 2019-11-28 LAB — URINE CULTURE
MICRO NUMBER:: 10416749
SPECIMEN QUALITY:: ADEQUATE

## 2019-11-28 LAB — HOUSE ACCOUNT TRACKING

## 2019-12-02 ENCOUNTER — Telehealth: Payer: Self-pay | Admitting: Family Medicine

## 2019-12-02 ENCOUNTER — Other Ambulatory Visit: Payer: Self-pay | Admitting: Family Medicine

## 2019-12-02 ENCOUNTER — Other Ambulatory Visit: Payer: Self-pay

## 2019-12-02 DIAGNOSIS — B952 Enterococcus as the cause of diseases classified elsewhere: Secondary | ICD-10-CM

## 2019-12-02 MED ORDER — SULFAMETHOXAZOLE-TRIMETHOPRIM 400-80 MG PO TABS: 1.0000 | ORAL_TABLET | Freq: Two times a day (BID) | ORAL | 0 refills | Status: DC

## 2019-12-02 NOTE — Telephone Encounter (Signed)
Patient is calling and states the medication was sent to wrong pharmacy. It is needing to go to [facility]   sulfamethoxazole-trimethoprim (BACTRIM) 400-80 MG tablet    [facility] - , [place] - [address] Phone:  [phone]  Fax:  [phone]

## 2019-12-02 NOTE — Telephone Encounter (Signed)
Sent in

## 2019-12-04 ENCOUNTER — Other Ambulatory Visit: Payer: Self-pay

## 2019-12-04 DIAGNOSIS — D649 Anemia, unspecified: Secondary | ICD-10-CM

## 2019-12-04 MED ORDER — FERROUS SULFATE 324 MG PO TBEC: 324.0000 mg | DELAYED_RELEASE_TABLET | Freq: Every day | ORAL | 1 refills | Status: DC

## 2019-12-11 ENCOUNTER — Encounter (HOSPITAL_COMMUNITY): Payer: Self-pay | Admitting: *Deleted

## 2019-12-11 ENCOUNTER — Other Ambulatory Visit: Payer: Self-pay

## 2019-12-12 ENCOUNTER — Inpatient Hospital Stay (HOSPITAL_COMMUNITY): Payer: Medicare Other | Attending: Hematology | Admitting: Hematology

## 2019-12-12 ENCOUNTER — Inpatient Hospital Stay (HOSPITAL_COMMUNITY): Payer: Medicare Other

## 2019-12-12 ENCOUNTER — Encounter (HOSPITAL_COMMUNITY): Payer: Self-pay | Admitting: Hematology

## 2019-12-12 DIAGNOSIS — Z800 Family history of malignant neoplasm of digestive organs: Secondary | ICD-10-CM | POA: Diagnosis not present

## 2019-12-12 DIAGNOSIS — I4891 Unspecified atrial fibrillation: Secondary | ICD-10-CM | POA: Diagnosis not present

## 2019-12-12 DIAGNOSIS — Z79899 Other long term (current) drug therapy: Secondary | ICD-10-CM

## 2019-12-12 DIAGNOSIS — I509 Heart failure, unspecified: Secondary | ICD-10-CM | POA: Insufficient documentation

## 2019-12-12 DIAGNOSIS — Z87891 Personal history of nicotine dependence: Secondary | ICD-10-CM | POA: Insufficient documentation

## 2019-12-12 DIAGNOSIS — N189 Chronic kidney disease, unspecified: Secondary | ICD-10-CM | POA: Insufficient documentation

## 2019-12-12 DIAGNOSIS — Z7901 Long term (current) use of anticoagulants: Secondary | ICD-10-CM | POA: Insufficient documentation

## 2019-12-12 DIAGNOSIS — N183 Chronic kidney disease, stage 3 unspecified: Secondary | ICD-10-CM | POA: Insufficient documentation

## 2019-12-12 DIAGNOSIS — Z8042 Family history of malignant neoplasm of prostate: Secondary | ICD-10-CM | POA: Diagnosis not present

## 2019-12-12 DIAGNOSIS — D631 Anemia in chronic kidney disease: Secondary | ICD-10-CM

## 2019-12-12 DIAGNOSIS — Z833 Family history of diabetes mellitus: Secondary | ICD-10-CM | POA: Insufficient documentation

## 2019-12-12 DIAGNOSIS — D509 Iron deficiency anemia, unspecified: Secondary | ICD-10-CM

## 2019-12-12 DIAGNOSIS — N1831 Chronic kidney disease, stage 3a: Secondary | ICD-10-CM

## 2019-12-12 DIAGNOSIS — I110 Hypertensive heart disease with heart failure: Secondary | ICD-10-CM | POA: Insufficient documentation

## 2019-12-12 DIAGNOSIS — J449 Chronic obstructive pulmonary disease, unspecified: Secondary | ICD-10-CM | POA: Diagnosis not present

## 2019-12-12 DIAGNOSIS — E119 Type 2 diabetes mellitus without complications: Secondary | ICD-10-CM | POA: Diagnosis not present

## 2019-12-12 HISTORY — DX: Anemia in chronic kidney disease: D63.1

## 2019-12-12 LAB — FOLATE: Folate: 46.8 ng/mL (ref >5.9)

## 2019-12-12 LAB — LACTATE DEHYDROGENASE: LDH: 117 U/L (ref 98–192)

## 2019-12-12 LAB — IRON AND TIBC
Iron: 26 ug/dL — ABNORMAL LOW (ref 28–170)
Saturation Ratios: 6 % — ABNORMAL LOW (ref 10.4–31.8)
TIBC: 467 ug/dL — ABNORMAL HIGH (ref 250–450)
UIBC: 441 ug/dL

## 2019-12-12 LAB — CBC WITH DIFFERENTIAL/PLATELET
Abs Immature Granulocytes: 0.03 10*3/uL (ref 0.00–0.07)
Basophils Absolute: 0 10*3/uL (ref 0.0–0.1)
Basophils Relative: 0 %
Eosinophils Absolute: 0.1 10*3/uL (ref 0.0–0.5)
Eosinophils Relative: 1 %
HCT: 32.1 % — ABNORMAL LOW (ref 36.0–46.0)
Hemoglobin: 9.7 g/dL — ABNORMAL LOW (ref 12.0–15.0)
Immature Granulocytes: 0 %
Lymphocytes Relative: 26 %
Lymphs Abs: 2.5 10*3/uL (ref 0.7–4.0)
MCH: 23.6 pg — ABNORMAL LOW (ref 26.0–34.0)
MCHC: 30.2 g/dL (ref 30.0–36.0)
MCV: 78.1 fL — ABNORMAL LOW (ref 80.0–100.0)
Monocytes Absolute: 0.6 10*3/uL (ref 0.1–1.0)
Monocytes Relative: 6 %
Neutro Abs: 6.5 10*3/uL (ref 1.7–7.7)
Neutrophils Relative %: 67 %
Platelets: 321 10*3/uL (ref 150–400)
RBC: 4.11 MIL/uL (ref 3.87–5.11)
RDW: 16.1 % — ABNORMAL HIGH (ref 11.5–15.5)
WBC: 9.8 10*3/uL (ref 4.0–10.5)
nRBC: 0 % (ref 0.0–0.2)

## 2019-12-12 LAB — RETICULOCYTES
Immature Retic Fract: 24.3 % — ABNORMAL HIGH (ref 2.3–15.9)
RBC.: 4.07 MIL/uL (ref 3.87–5.11)
Retic Count, Absolute: 37.9 10*3/uL (ref 19.0–186.0)
Retic Ct Pct: 0.9 % (ref 0.4–3.1)

## 2019-12-12 LAB — FERRITIN: Ferritin: 9 ng/mL — ABNORMAL LOW (ref 11–307)

## 2019-12-12 LAB — VITAMIN B12: Vitamin B-12: 281 pg/mL (ref 180–914)

## 2019-12-12 NOTE — Progress Notes (Signed)
CONSULT NOTE  Patient Care Team: Corum, Minerva Fester, MD as PCP - General (Family Medicine) Jonelle Sidle, MD as PCP - Cardiology (Cardiology) Jena Gauss Gerrit Friends, MD as Consulting Physician (Gastroenterology)  CHIEF COMPLAINTS/PURPOSE OF CONSULTATION:  Microcytic anemia.  HISTORY OF PRESENTING ILLNESS:  Leslie Kim 84 y.o. female seen in consultation today at the request of Dr. Judee Clara for further work-up and management of microcytic anemia.  CBC on 11/20/2019 showed hemoglobin 9.4 with MCV of 75.  White count was 12 and platelet count was 358.  Ferritin was 14 and iron panel was 5.  Creatinine was elevated at 1.13.  She reports dyspnea on exertion particularly when she goes up the steps in her house.  She reportedly had Covid infection in January.  Denies any chest pain on exertion.  Denies any presyncopal episodes or palpitations.  She never had a colonoscopy and does not want to have one.  No prior history of malignancy.  Denies any pica.  Denies any prior history of transfusions.  She lives at home with her husband.  She worked at [employer] prior to retirement.  Quit smoking cigarettes 12 years ago.  She smoked 1 pack/day for 40 years.  Family history significant for brother with colon cancer and father with prostate cancer.  She started taking iron tablet daily on 12/08/2019.  She does not report any side effects including constipation or stomach upset.  She reportedly took iron tablets 2 years ago for couple of months when she was found to be anemic.  Appetite is 100%.  Energy levels are low.  No new pains reported.  Feels occasional lightheadedness.    MEDICAL HISTORY:  Past Medical History:  Diagnosis Date  . Allergy   . Cataract   . CHF (congestive heart failure) (HCC)   . COPD (chronic obstructive pulmonary disease) (HCC)   . Essential hypertension   . New onset atrial fibrillation (HCC) 11/2015   a. s/p DCCV on 12/18/2015 b. started on Eliquis  . Nonischemic cardiomyopathy (HCC)    a. 11/2015: echo showing EF of 30-35%, cath with nonobstructive dz  . Type 2 diabetes mellitus (HCC)     SURGICAL HISTORY: Past Surgical History:  Procedure Laterality Date  . CARDIAC CATHETERIZATION N/A 12/23/2015   Procedure: Right/Left Heart Cath and Coronary Angiography;  Surgeon: Laurey Morale, MD;  Location: [facility] INVASIVE CV LAB;  Service: Cardiovascular;  Laterality: N/A;  . cataract surgery    . CHOLECYSTECTOMY      SOCIAL HISTORY: Social History   Socioeconomic History  . Marital status: Married    Spouse name: Not on file  . Number of children: Not on file  . Years of education: Not on file  . Highest education level: Not on file  Occupational History  . Not on file  Tobacco Use  . Smoking status: Former Smoker    Packs/day: 1.00    Years: 40.00    Pack years: 40.00    Types: Cigarettes    Quit date: 12/18/2007    Years since quitting: 11.9  . Smokeless tobacco: Never Used  . Tobacco comment: Quit for 8 years  Substance and Sexual Activity  . Alcohol use: No    Alcohol/week: 0.0 standard drinks  . Drug use: No  . Sexual activity: Yes  Other Topics Concern  . Not on file  Social History Narrative  . Not on file   Social Determinants of Health   Financial Resource Strain:   . Difficulty of  Paying Living Expenses:   Food Insecurity:   . Worried About Programme researcher, broadcasting/film/video in the Last Year:   . Barista in the Last Year:   Transportation Needs:   . Freight forwarder (Medical):   Marland Kitchen Lack of Transportation (Non-Medical):   Physical Activity:   . Days of Exercise per Week:   . Minutes of Exercise per Session:   Stress:   . Feeling of Stress :   Social Connections:   . Frequency of Communication with Friends and Family:   . Frequency of Social Gatherings with Friends and Family:   . Attends Religious Services:   . Active Member of Clubs or Organizations:   . Attends Banker Meetings:   Marland Kitchen Marital Status:   Intimate Partner  Violence:   . Fear of Current or Ex-Partner:   . Emotionally Abused:   Marland Kitchen Physically Abused:   . Sexually Abused:     FAMILY HISTORY: Family History  Problem Relation Age of Onset  . Diabetes Mother   . Diabetes Father   . Cancer Father   . Prostate cancer Father   . Diabetes Brother   . Diabetes Brother   . Diabetes Brother   . Diabetes Daughter   . Diabetes Son     ALLERGIES:  is allergic to codeine.  MEDICATIONS:  Current Outpatient Medications  Medication Sig Dispense Refill  . acetaminophen (TYLENOL) 500 MG tablet Take 1,000 mg by mouth every 6 (six) hours as needed. Pain    . apixaban (ELIQUIS) 5 MG TABS tablet Take 1 tablet (5 mg total) by mouth 2 (two) times daily. 60 tablet 6  . b complex vitamins tablet Take 1 tablet by mouth every other day.    . carvedilol (COREG) 6.25 MG tablet Take 1 tablet (6.25 mg total) by mouth 2 (two) times daily with a meal. 180 tablet 3  . cholecalciferol (VITAMIN D3) 25 MCG (1000 UNIT) tablet Take 1,000 Units by mouth daily.    Marland Kitchen esomeprazole (NEXIUM) 20 MG capsule Take 20 mg by mouth daily.     . ferrous sulfate 324 MG TBEC Take 1 tablet (324 mg total) by mouth daily with breakfast. 30 tablet 1  . furosemide (LASIX) 40 MG tablet TAKE 1 TABLET(40 MG) BY MOUTH DAILY 90 tablet 3  . losartan (COZAAR) 50 MG tablet Take 1 tablet (50 mg total) by mouth 2 (two) times daily. 180 tablet 3  . Melatonin 3 MG TABS Take 2 tablets (6 mg total) by mouth at bedtime. 30 tablet 1  . Multiple Vitamin (MULTIVITAMIN WITH MINERALS) TABS tablet Take 1 tablet by mouth daily. 30 tablet 0  . PROAIR HFA 108 (90 Base) MCG/ACT inhaler Inhale 2 puffs into the lungs 4 (four) times daily as needed for wheezing or shortness of breath.   12  . sitaGLIPtin-metformin (JANUMET) 50-1000 MG tablet Take 1 tablet by mouth 2 (two) times daily. 180 tablet 1  . spironolactone (ALDACTONE) 25 MG tablet TAKE 1/2 TABLET(12.5 MG) BY MOUTH DAILY 45 tablet 3  . TRELEGY ELLIPTA 100-62.5-25  MCG/INH AEPB Inhale 1 puff into the lungs daily.  5   No current facility-administered medications for this visit.    REVIEW OF SYSTEMS:   Constitutional: Denies fevers, chills or abnormal night sweats Eyes: Denies blurriness of vision, double vision or watery eyes Ears, nose, mouth, throat, and face: Denies mucositis or sore throat Respiratory: Positive for dyspnea on exertion. Cardiovascular: Denies palpitation, chest discomfort or lower extremity  swelling.  Occasional dizziness present. Gastrointestinal:  Denies nausea, heartburn or change in bowel habits Skin: Denies abnormal skin rashes Lymphatics: Denies new lymphadenopathy or easy bruising Neurological:Denies numbness, tingling or new weaknesses Behavioral/Psych: Mood is stable, no new changes  All other systems were reviewed with the patient and are negative.  PHYSICAL EXAMINATION: ECOG PERFORMANCE STATUS: 1 - Symptomatic but completely ambulatory  Vitals:   12/12/19 1200 12/12/19 1201  BP: (!) 153/101 (!) 153/59  Pulse: 80   Resp: 18   Temp: (!) 96.9 F (36.1 C)    Filed Weights   12/12/19 1200  Weight: 169 lb 14.4 oz (77.1 kg)    GENERAL:alert, no distress and comfortable SKIN: skin color, texture, turgor are normal, no rashes or significant lesions EYES: normal, conjunctiva are pink and non-injected, sclera clear OROPHARYNX:no exudate, no erythema and lips, buccal mucosa, and tongue normal  NECK: supple, thyroid normal size, non-tender, without nodularity LYMPH:  no palpable lymphadenopathy in the cervical, axillary or inguinal LUNGS: clear to auscultation and percussion with normal breathing effort HEART: regular rate & rhythm and no murmurs and no lower extremity edema ABDOMEN:abdomen soft, non-tender and normal bowel sounds Musculoskeletal:no cyanosis of digits and no clubbing  PSYCH: alert & oriented x 3 with fluent speech NEURO: no focal motor/sensory deficits  LABORATORY DATA:  I have reviewed the data  as listed Recent Results (from the past 2160 hour(s))  CBC/Diff Ambiguous Default     Status: Abnormal   Collection Time: 11/20/19  2:59 PM  Result Value Ref Range   WBC 12.0 (H) 3.4 - 10.8 x10E3/uL   RBC 3.99 3.77 - 5.28 x10E6/uL   Hemoglobin 9.4 (L) 11.1 - 15.9 g/dL   Hematocrit 21.3 (L) 08.6 - 46.6 %   MCV 75 (L) 79 - 97 fL   MCH 23.6 (L) 26.6 - 33.0 pg   MCHC 31.2 (L) 31.5 - 35.7 g/dL   RDW 57.8 46.9 - 62.9 %   Platelets 358 150 - 450 x10E3/uL   Neutrophils 59 Not Estab. %   Lymphs 33 Not Estab. %   Monocytes 7 Not Estab. %   Eos 1 Not Estab. %   Basos 0 Not Estab. %   Neutrophils Absolute 7.1 (H) 1.4 - 7.0 x10E3/uL   Lymphocytes Absolute 3.9 (H) 0.7 - 3.1 x10E3/uL   Monocytes Absolute 0.8 0.1 - 0.9 x10E3/uL   EOS (ABSOLUTE) 0.1 0.0 - 0.4 x10E3/uL   Basophils Absolute 0.0 0.0 - 0.2 x10E3/uL   Immature Granulocytes 0 Not Estab. %   Immature Grans (Abs) 0.0 0.0 - 0.1 x10E3/uL    Comment: A hand-written panel/profile was received from your office. In accordance with the [facility] Ambiguous Test Code Policy dated July 2003, we have assigned CBC with Differential/Platelet, Test Code #005009 to this request. If this is not the testing you wished to receive on this specimen, please contact the [facility] Department to clarify the test order. We appreciate your business.   Comprehensive metabolic panel     Status: Abnormal   Collection Time: 11/20/19  2:59 PM  Result Value Ref Range   Glucose 101 (H) 65 - 99 mg/dL   BUN 21 8 - 27 mg/dL   Creatinine, Ser 5.28 (H) 0.57 - 1.00 mg/dL   GFR calc non Af Amer 46 (L) >59 mL/min/1.73   GFR calc Af Amer 53 (L) >59 mL/min/1.73   BUN/Creatinine Ratio 19 12 - 28   Sodium 132 (L) 134 - 144 mmol/L  Potassium 4.6 3.5 - 5.2 mmol/L   Chloride 91 (L) 96 - 106 mmol/L   CO2 24 20 - 29 mmol/L   Calcium 9.9 8.7 - 10.3 mg/dL   Total Protein 7.8 6.0 - 8.5 g/dL   Albumin 4.5 3.6 - 4.6 g/dL   Globulin, Total 3.3 1.5  - 4.5 g/dL   Albumin/Globulin Ratio 1.4 1.2 - 2.2   Bilirubin Total <0.2 0.0 - 1.2 mg/dL   Alkaline Phosphatase 62 39 - 117 IU/L   AST 19 0 - 40 IU/L   ALT 15 0 - 32 IU/L  Urinalysis, Routine w reflex microscopic     Status: None   Collection Time: 11/20/19  2:59 PM  Result Value Ref Range   Specific Gravity, UA CANCELED     Comment: Test not performed. Patient was unable to provide a self-collected specimen for the requested testing. The following test(s) were not performed:  Result canceled by the ancillary.    pH, UA CANCELED     Comment: Test not performed  Result canceled by the ancillary.    Protein,UA CANCELED     Comment: Test not performed  Result canceled by the ancillary.    Glucose, UA CANCELED     Comment: Test not performed  Result canceled by the ancillary.    Ketones, UA CANCELED     Comment: Test not performed  Result canceled by the ancillary.   Hgb A1c w/o eAG     Status: Abnormal   Collection Time: 11/20/19  2:59 PM  Result Value Ref Range   Hgb A1c MFr Bld 7.2 (H) 4.8 - 5.6 %    Comment:          Prediabetes: 5.7 - 6.4          Diabetes: >6.4          Glycemic control for adults with diabetes: <7.0   TSH     Status: None   Collection Time: 11/20/19  2:59 PM  Result Value Ref Range   TSH 3.460 0.450 - 4.500 uIU/mL  Specimen status report     Status: None   Collection Time: 11/20/19  2:59 PM  Result Value Ref Range   specimen status report Comment     Comment: Isac Caddy CMP14 Default Ambig Abbrev CMP14 Default A hand-written panel/profile was received from your office. In accordance with the [facility] Ambiguous Test Code Policy dated July 0272, we have completed your order by using the closest currently or formerly recognized AMA panel.  We have assigned Comprehensive Metabolic Panel (14), Test Code #322000 to this request.  If this is not the testing you wished to receive on this specimen, please contact the [facility] Client  Inquiry/Technical Services Department to clarify the test order.  We appreciate your business.   Iron and TIBC     Status: Abnormal   Collection Time: 11/20/19  2:59 PM  Result Value Ref Range   Total Iron Binding Capacity 399 250 - 450 ug/dL   UIBC 379 (H) 118 - 369 ug/dL   Iron 20 (L) 27 - 139 ug/dL   Iron Saturation 5 (LL) 15 - 55 %  Ferritin     Status: Abnormal   Collection Time: 11/20/19  2:59 PM  Result Value Ref Range   Ferritin 14 (L) 15 - 150 ng/mL  Specimen status report     Status: None   Collection Time: 11/20/19  2:59 PM  Result Value Ref Range   specimen status report Comment  Comment: Written Authorization Written Authorization Written Authorization Received. Authorization received from [employer] 11-27-2019 Logged by Adria Dill   House Account Tracking only     Status: None   Collection Time: 11/26/19 12:00 AM  Result Value Ref Range   Tracking House Account      Comment: We were unable to identify an account number for the order submitted. If you do not have a [facility]  account number or if your account information needs  to be updated please call [phone] for assistance. . To prevent delays in testing and processing of your orders please provide the following information for this order and with every additional order submitted: [facility] account number and account name  Client address Client phone and fax number NPI number of ordering physician along with the physician name.   Urine Culture     Status: Abnormal   Collection Time: 11/26/19 12:00 AM  Result Value Ref Range   MICRO NUMBER: [id number]    SPECIMEN QUALITY: Adequate    Sample Source NOT GIVEN    STATUS: FINAL    ISOLATE 1: Enterobacter aerogenes (A)     Comment: Greater than 100,000 CFU/mL of Klebsiella aerogenes (Enterobacter)      Susceptibility   Enterobacter aerogenes - URZ, 1NEG    AMOX/CLAVULANIC >=32 Resistant     CEFAZOLIN* >=64  Resistant      * For uncomplicated UTI caused by E. coli,K. pneumoniae or P. mirabilis: Cefazolin issusceptible if MIC <32 mcg/mL and predictssusceptible to the oral agents cefaclor, cefdinir,cefpodoxime, cefprozil, cefuroxime, cephalexinand loracarbef.    CEFEPIME <=1 Sensitive     CEFTRIAXONE <=1 Sensitive     CIPROFLOXACIN <=0.25 Sensitive     LEVOFLOXACIN <=0.12 Sensitive     ERTAPENEM <=0.5 Sensitive     GENTAMICIN <=1 Sensitive     IMIPENEM 1 Sensitive     NITROFURANTOIN 64 Intermediate     PIP/TAZO <=4 Sensitive     TOBRAMYCIN <=1 Sensitive     TRIMETH/SULFA <=20 Sensitive   POCT urinalysis dipstick     Status: Abnormal   Collection Time: 11/26/19  2:18 PM  Result Value Ref Range   Color, UA yellow yellow   Clarity, UA clear clear   Glucose, UA negative negative mg/dL   Bilirubin, UA negative negative   Ketones, POC UA negative negative mg/dL   Spec Grav, UA 1.388 7.195 - 1.025   Blood, UA trace-intact (A) negative   pH, UA 6.0 5.0 - 8.0   Protein Ur, POC negative negative mg/dL   Urobilinogen, UA 0.2 0.2 or 1.0 E.U./dL   Nitrite, UA Positive (A) Negative   Leukocytes, UA Small (1+) (A) Negative  Reticulocytes     Status: Abnormal   Collection Time: 12/12/19 12:31 PM  Result Value Ref Range   Retic Ct Pct 0.9 0.4 - 3.1 %   RBC. 4.07 3.87 - 5.11 MIL/uL   Retic Count, Absolute 37.9 19.0 - 186.0 K/uL   Immature Retic Fract 24.3 (H) 2.3 - 15.9 %    Comment: Performed at [facility], [address], [place], [place] [address]  Lactate dehydrogenase     Status: None   Collection Time: 12/12/19 12:31 PM  Result Value Ref Range   LDH 117 98 - 192 U/L    Comment: Performed at [facility], [address], [place], [place] [address]  CBC with Differential     Status: Abnormal   Collection Time: 12/12/19 12:31 PM  Result Value Ref Range  WBC 9.8 4.0 - 10.5 K/uL   RBC 4.11 3.87 - 5.11 MIL/uL   Hemoglobin 9.7 (L) 12.0 - 15.0 g/dL   HCT 09.6 (L) 04.5 - 40.9 %   MCV 78.1 (L)  80.0 - 100.0 fL   MCH 23.6 (L) 26.0 - 34.0 pg   MCHC 30.2 30.0 - 36.0 g/dL   RDW 81.1 (H) 91.4 - 78.2 %   Platelets 321 150 - 400 K/uL   nRBC 0.0 0.0 - 0.2 %   Neutrophils Relative % 67 %   Neutro Abs 6.5 1.7 - 7.7 K/uL   Lymphocytes Relative 26 %   Lymphs Abs 2.5 0.7 - 4.0 K/uL   Monocytes Relative 6 %   Monocytes Absolute 0.6 0.1 - 1.0 K/uL   Eosinophils Relative 1 %   Eosinophils Absolute 0.1 0.0 - 0.5 K/uL   Basophils Relative 0 %   Basophils Absolute 0.0 0.0 - 0.1 K/uL   Immature Granulocytes 0 %   Abs Immature Granulocytes 0.03 0.00 - 0.07 K/uL    Comment: Performed at [facility], [address], [place], [place] [address]    RADIOGRAPHIC STUDIES: I have personally reviewed the radiological images as listed and agreed with the findings in the report.  ASSESSMENT & PLAN:  Microcytic anemia 1.  Microcytic anemia: -CBC on 11/20/2019 shows hemoglobin 9.4 with MCV of 75.  Ferritin was 14 with percent saturation of 5.  Creatinine was 1.13. -She never had colonoscopy.  Denies any bleeding per rectum or melena. -She reportedly started taking iron tablet on 12/08/2019.  So far she has tolerated it very well. -She reports getting tired on exertion, particularly when going up the steps. -We will repeat her CBC and check for other nutritional deficiencies including B12, folic acid, methylmalonic acid, copper levels.  We will also check her stool for occult blood.  Will check SPEP, reticulocyte count, haptoglobin and LDH. -Most likely etiology of anemia is iron deficiency and component of chronic kidney disease. -She will continue iron tablet as she is tolerating well.  We will plan to repeat her CBC, ferritin and iron panel in 6 weeks. -If there is significant improvement she will continue oral iron.  If there is no improvement, will consider parenteral iron therapy.  2.  Family history: -Brother had colon cancer and father had prostate cancer.  3.  CKD: -She has a slightly elevated  creatinine since 2017.  It ranges from 1.04-1.3.  4.  Nonischemic cardiomyopathy/atrial fibrillation: -She is on Eliquis 5 mg twice daily.  Continue Coreg and Cozaar.  5.  Smoking history: -She smoked 1 pack/day for 40 years.  She quit smoking about 12 years ago. -Chest x-ray on 08/12/2019 showed mild ill-defined opacities noted peripherally in both lungs, particularly right lung. -We will make a referral to our lung cancer screening program.     All questions were answered. The patient knows to call the clinic with any problems, questions or concerns.      Doreatha Massed, MD 12/12/19 1:12 PM

## 2019-12-12 NOTE — Assessment & Plan Note (Addendum)
1.  Microcytic anemia: -CBC on 11/20/2019 shows hemoglobin 9.4 with MCV of 75.  Ferritin was 14 with percent saturation of 5.  Creatinine was 1.13. -She never had colonoscopy.  Denies any bleeding per rectum or melena. -She reportedly started taking iron tablet on 12/08/2019.  So far she has tolerated it very well. -She reports getting tired on exertion, particularly when going up the steps. -We will repeat her CBC and check for other nutritional deficiencies including B12, folic acid, methylmalonic acid, copper levels.  We will also check her stool for occult blood.  Will check SPEP, reticulocyte count, haptoglobin and LDH. -Most likely etiology of anemia is iron deficiency and component of chronic kidney disease. -She will continue iron tablet as she is tolerating well.  We will plan to repeat her CBC, ferritin and iron panel in 6 weeks. -If there is significant improvement she will continue oral iron.  If there is no improvement, will consider parenteral iron therapy.  2.  Family history: -Brother had colon cancer and father had prostate cancer.  3.  CKD: -She has a slightly elevated creatinine since 2017.  It ranges from 1.04-1.3.  4.  Nonischemic cardiomyopathy/atrial fibrillation: -She is on Eliquis 5 mg twice daily.  Continue Coreg and Cozaar.  5.  Smoking history: -She smoked 1 pack/day for 40 years.  She quit smoking about 12 years ago. -Chest x-ray on 08/12/2019 showed mild ill-defined opacities noted peripherally in both lungs, particularly right lung. -We will make a referral to our lung cancer screening program.

## 2019-12-12 NOTE — Patient Instructions (Addendum)
[place] at [facility] Discharge Instructions  You were seen today by Dr. Delton Coombes. He went over your history, family history and how you've been feeling lately. You will have blood drawn today before you leave the hospital. He will refer you to our lung cancer screening program. He is sending you home with a stool card collection kit. You will need to collect a specimen from three different days. Once you have collected the specimens you can bring them back to the cancer center. He will see you back in 6 weeks for labs and follow up.   Thank you for choosing [place] at [facility] to provide your oncology and hematology care.  To afford each patient quality time with our provider, please arrive at least 15 minutes before your scheduled appointment time.   If you have a lab appointment with the [facility] please come in thru the  Main Entrance and check in at the main information desk  You need to re-schedule your appointment should you arrive 10 or more minutes late.  We strive to give you quality time with our providers, and arriving late affects you and other patients whose appointments are after yours.  Also, if you no show three or more times for appointments you may be dismissed from the clinic at the providers discretion.     Again, thank you for choosing [facility].  Our hope is that these requests will decrease the amount of time that you wait before being seen by our physicians.       _____________________________________________________________  Should you have questions after your visit to [facility], please contact our office at [phone] between the hours of 8:00 a.m. and 4:30 p.m.  Voicemails left after 4:00 p.m. will not be returned until the following business day.  For prescription refill requests, have your pharmacy contact our office and allow 72 hours.    Cancer Center Support Programs:    > Cancer Support Group  2nd Tuesday of the month 1pm-2pm, Journey Room

## 2019-12-13 LAB — HAPTOGLOBIN: Haptoglobin: 305 mg/dL (ref 41–333)

## 2019-12-15 ENCOUNTER — Encounter (HOSPITAL_COMMUNITY): Payer: Self-pay | Admitting: *Deleted

## 2019-12-15 LAB — PROTEIN ELECTROPHORESIS, SERUM
A/G Ratio: 0.9 (ref 0.7–1.7)
Albumin ELP: 3.7 g/dL (ref 2.9–4.4)
Alpha-1-Globulin: 0.2 g/dL (ref 0.0–0.4)
Alpha-2-Globulin: 1.1 g/dL — ABNORMAL HIGH (ref 0.4–1.0)
Beta Globulin: 1.3 g/dL (ref 0.7–1.3)
Gamma Globulin: 1.3 g/dL (ref 0.4–1.8)
Globulin, Total: 3.9 g/dL (ref 2.2–3.9)
Total Protein ELP: 7.6 g/dL (ref 6.0–8.5)

## 2019-12-15 NOTE — Progress Notes (Signed)
I received notification from our schedulers that patient was referred to our lung cancer screening program.  Patient is 84 years of age and does not meet the age requirements (55-80) per CMS guidelines.  I have called patient this morning and advised that she does not meet requirements but if she develops any symptoms to report to [place] or her PCP immediately and we can evaluate at that time. She verbalizes understanding at this time.

## 2019-12-16 LAB — COPPER, SERUM: Copper: 148 ug/dL (ref 80–158)

## 2019-12-18 LAB — METHYLMALONIC ACID, SERUM: Methylmalonic Acid, Quantitative: 319 nmol/L (ref 0–378)

## 2020-01-02 ENCOUNTER — Ambulatory Visit (HOSPITAL_COMMUNITY): Payer: Medicare Other | Admitting: Hematology

## 2020-01-08 DIAGNOSIS — D509 Iron deficiency anemia, unspecified: Secondary | ICD-10-CM | POA: Diagnosis not present

## 2020-01-08 DIAGNOSIS — J449 Chronic obstructive pulmonary disease, unspecified: Secondary | ICD-10-CM | POA: Diagnosis not present

## 2020-01-08 DIAGNOSIS — E0821 Diabetes mellitus due to underlying condition with diabetic nephropathy: Secondary | ICD-10-CM | POA: Diagnosis not present

## 2020-01-08 DIAGNOSIS — E559 Vitamin D deficiency, unspecified: Secondary | ICD-10-CM | POA: Diagnosis not present

## 2020-01-08 DIAGNOSIS — I509 Heart failure, unspecified: Secondary | ICD-10-CM | POA: Diagnosis not present

## 2020-01-08 DIAGNOSIS — Z0189 Encounter for other specified special examinations: Secondary | ICD-10-CM | POA: Diagnosis not present

## 2020-01-21 ENCOUNTER — Other Ambulatory Visit: Payer: Self-pay | Admitting: Cardiology

## 2020-01-22 ENCOUNTER — Ambulatory Visit (HOSPITAL_COMMUNITY): Payer: Medicare Other | Admitting: Oncology

## 2020-02-19 ENCOUNTER — Other Ambulatory Visit: Payer: Self-pay | Admitting: Cardiology

## 2020-03-22 DIAGNOSIS — Z1329 Encounter for screening for other suspected endocrine disorder: Secondary | ICD-10-CM | POA: Diagnosis not present

## 2020-03-22 DIAGNOSIS — E559 Vitamin D deficiency, unspecified: Secondary | ICD-10-CM | POA: Diagnosis not present

## 2020-03-22 DIAGNOSIS — E0821 Diabetes mellitus due to underlying condition with diabetic nephropathy: Secondary | ICD-10-CM | POA: Diagnosis not present

## 2020-03-22 DIAGNOSIS — D509 Iron deficiency anemia, unspecified: Secondary | ICD-10-CM | POA: Diagnosis not present

## 2020-03-23 ENCOUNTER — Other Ambulatory Visit: Payer: Self-pay | Admitting: *Deleted

## 2020-03-23 DIAGNOSIS — I428 Other cardiomyopathies: Secondary | ICD-10-CM

## 2020-03-25 DIAGNOSIS — D509 Iron deficiency anemia, unspecified: Secondary | ICD-10-CM | POA: Diagnosis not present

## 2020-03-25 DIAGNOSIS — E559 Vitamin D deficiency, unspecified: Secondary | ICD-10-CM | POA: Diagnosis not present

## 2020-03-25 DIAGNOSIS — J449 Chronic obstructive pulmonary disease, unspecified: Secondary | ICD-10-CM | POA: Diagnosis not present

## 2020-03-25 DIAGNOSIS — I509 Heart failure, unspecified: Secondary | ICD-10-CM | POA: Diagnosis not present

## 2020-03-25 DIAGNOSIS — E0821 Diabetes mellitus due to underlying condition with diabetic nephropathy: Secondary | ICD-10-CM | POA: Diagnosis not present

## 2020-04-12 DIAGNOSIS — H5211 Myopia, right eye: Secondary | ICD-10-CM | POA: Diagnosis not present

## 2020-04-12 DIAGNOSIS — H524 Presbyopia: Secondary | ICD-10-CM | POA: Diagnosis not present

## 2020-04-12 DIAGNOSIS — Z961 Presence of intraocular lens: Secondary | ICD-10-CM | POA: Diagnosis not present

## 2020-04-12 DIAGNOSIS — E119 Type 2 diabetes mellitus without complications: Secondary | ICD-10-CM | POA: Diagnosis not present

## 2020-04-12 DIAGNOSIS — H52201 Unspecified astigmatism, right eye: Secondary | ICD-10-CM | POA: Diagnosis not present

## 2020-04-28 ENCOUNTER — Ambulatory Visit (INDEPENDENT_AMBULATORY_CARE_PROVIDER_SITE_OTHER): Payer: Medicare Other

## 2020-04-28 DIAGNOSIS — I428 Other cardiomyopathies: Secondary | ICD-10-CM

## 2020-04-28 LAB — ECHOCARDIOGRAM COMPLETE
Area-P 1/2: 1.54 cm2
Calc EF: 46.9 %
MV M vel: 3.9 m/s
MV Peak grad: 60.7 mmHg
P 1/2 time: 177 msec
S' Lateral: 3.46 cm
Single Plane A2C EF: 47.8 %
Single Plane A4C EF: 47.5 %

## 2020-04-29 ENCOUNTER — Telehealth: Payer: Self-pay | Admitting: Cardiology

## 2020-04-29 NOTE — Telephone Encounter (Signed)
New message     Patient returning call to [facility] , please call back

## 2020-04-29 NOTE — Telephone Encounter (Signed)
Echo results given to patient. 

## 2020-05-04 NOTE — Progress Notes (Signed)
Cardiology Office Note  Date: 05/05/2020   ID: [name], DOB [birth year], MRN [id number]  PCP:  Benita Stabile, MD  Cardiologist:  Nona Dell, MD Electrophysiologist:  None   Chief Complaint  Patient presents with  . Cardiac follow-up    History of Present Illness: Leslie Kim is an 84 y.o. female last seen in September 2020.  She presents for a routine visit.  She does not report any worsening shortness of breath with basic ADLs, states that she does not get to the house very much.  She does not report any angina symptoms or palpitations.  Recent echocardiogram revealed improvement in LVEF to the range of 50 to 55% as noted below.  We discussed this today.  She did not tolerate Entresto and ultimately switched back to Cozaar.  Blood pressure has remained elevated however.  I reviewed the remainder of her medications which are listed below.  She is now following with Dr. Margo Aye for primary care.  Past Medical History:  Diagnosis Date  . Allergy   . Cataract   . CHF (congestive heart failure) (HCC)   . COPD (chronic obstructive pulmonary disease) (HCC)   . Essential hypertension   . New onset atrial fibrillation (HCC) 11/2015   a. s/p DCCV on 12/18/2015 b. started on Eliquis  . Nonischemic cardiomyopathy (HCC)    a. 11/2015: echo showing EF of 30-35%, cath with nonobstructive dz  . Type 2 diabetes mellitus (HCC)     Past Surgical History:  Procedure Laterality Date  . CARDIAC CATHETERIZATION N/A 12/23/2015   Procedure: Right/Left Heart Cath and Coronary Angiography;  Surgeon: Laurey Morale, MD;  Location: [facility] INVASIVE CV LAB;  Service: Cardiovascular;  Laterality: N/A;  . cataract surgery    . CHOLECYSTECTOMY      Current Outpatient Medications  Medication Sig Dispense Refill  . acetaminophen (TYLENOL) 500 MG tablet Take 1,000 mg by mouth every 6 (six) hours as needed. Pain    . apixaban (ELIQUIS) 5 MG TABS tablet Take 1 tablet (5 mg total) by mouth 2 (two)  times daily. 60 tablet 6  . b complex vitamins tablet Take 1 tablet by mouth every other day.    . cholecalciferol (VITAMIN D3) 25 MCG (1000 UNIT) tablet Take 1,000 Units by mouth daily.    Marland Kitchen esomeprazole (NEXIUM) 20 MG capsule Take 20 mg by mouth daily.     . ferrous sulfate 324 MG TBEC Take 1 tablet (324 mg total) by mouth daily with breakfast. 30 tablet 1  . furosemide (LASIX) 40 MG tablet TAKE 1 TABLET(40 MG) BY MOUTH DAILY 90 tablet 3  . losartan (COZAAR) 50 MG tablet Take 1 tablet (50 mg total) by mouth 2 (two) times daily. 180 tablet 3  . Melatonin 3 MG TABS Take 2 tablets (6 mg total) by mouth at bedtime. 30 tablet 1  . Multiple Vitamin (MULTIVITAMIN WITH MINERALS) TABS tablet Take 1 tablet by mouth daily. 30 tablet 0  . PROAIR HFA 108 (90 Base) MCG/ACT inhaler Inhale 2 puffs into the lungs 4 (four) times daily as needed for wheezing or shortness of breath.   12  . sitaGLIPtin-metformin (JANUMET) 50-1000 MG tablet Take 1 tablet by mouth 2 (two) times daily. 180 tablet 1  . spironolactone (ALDACTONE) 25 MG tablet TAKE 1/2 TABLET(12.5 MG) BY MOUTH DAILY 45 tablet 3  . TRELEGY ELLIPTA 100-62.5-25 MCG/INH AEPB INHALE 1 PUFF INTO THE LUNGS ONCE DAILY. 60 each 0  . carvedilol (COREG) 12.5  MG tablet Take 1 tablet (12.5 mg total) by mouth 2 (two) times daily. 180 tablet 3   No current facility-administered medications for this visit.   Allergies:  Codeine   ROS:  No syncope.  Physical Exam: VS:  BP (!) 160/74   Pulse 88   Ht 5\' 3"  (1.6 m)   Wt 168 lb (76.2 kg)   SpO2 98%   BMI 29.76 kg/m , BMI Body mass index is 29.76 kg/m.  Wt Readings from Last 3 Encounters:  05/05/20 168 lb (76.2 kg)  12/12/19 169 lb 14.4 oz (77.1 kg)  11/26/19 169 lb 3.2 oz (76.7 kg)    General: Elderly woman, appears comfortable at rest. HEENT: Conjunctiva and lids normal, wearing a mask. Neck: Supple, no elevated JVP or carotid bruits, no thyromegaly. Lungs: Clear to auscultation, nonlabored breathing at  rest. Cardiac: Regular rate and rhythm, no S3, soft systolic murmur, no pericardial rub. Extremities: No pitting edema.  ECG:  An ECG dated 04/03/2019 was personally reviewed today and demonstrated:  Sinus rhythm with incomplete left bundle branch block.  Recent Labwork: 08/14/2019: Magnesium 1.8 11/20/2019: ALT 15; AST 19; BUN 21; Creatinine, Ser 1.13; Potassium 4.6; Sodium 132; TSH 3.460 12/12/2019: Hemoglobin 9.7; Platelets 321     Component Value Date/Time   CHOL 165 12/21/2015 0458   TRIG 146 08/12/2019 1610   HDL 49 12/21/2015 0458   CHOLHDL 3.4 12/21/2015 0458   VLDL 19 12/21/2015 0458   LDLCALC 97 12/21/2015 0458    Other Studies Reviewed Today:  Echocardiogram 04/28/2020: 1. Left ventricular ejection fraction, by estimation, is 50 to 55%. The  left ventricle has normal function. The left ventricle has no regional  wall motion abnormalities. Left ventricular diastolic parameters are  consistent with Grade I diastolic  dysfunction (impaired relaxation). Elevated left atrial pressure.  2. Right ventricular systolic function is normal. The right ventricular  size is normal.  3. The mitral valve is normal in structure. No evidence of mitral valve  regurgitation. No evidence of mitral stenosis.  4. The aortic valve was not well visualized. There is mild calcification  of the aortic valve. There is mild thickening of the aortic valve. Aortic  valve regurgitation is mild. No aortic stenosis is present.  5. The inferior vena cava is normal in size with greater than 50%  respiratory variability, suggesting right atrial pressure of 3 mmHg.  Assessment and Plan:  1.  History of nonischemic cardiomyopathy with improvement in LVEF most recently to the range of 50 to 55%.  RV function is also normal.  She did not tolerate Entresto.  Plan to continue Coreg with increased dose to 12.5 mg twice daily, losartan, and Aldactone.  2.  Essential hypertension, Coreg being increased to 12.5  mg twice daily, continue the remainder of her regimen.  3.  Paroxysmal atrial fibrillation, no palpitations and heart rate regular today.  She remains on Eliquis for stroke prophylaxis and is now followed by Dr. 04/30/2020 for primary care.  I reviewed her lab work from earlier in the year.  Medication Adjustments/Labs and Tests Ordered: Current medicines are reviewed at length with the patient today.  Concerns regarding medicines are outlined above.   Tests Ordered: No orders of the defined types were placed in this encounter.   Medication Changes: Meds ordered this encounter  Medications  . carvedilol (COREG) 12.5 MG tablet    Sig: Take 1 tablet (12.5 mg total) by mouth 2 (two) times daily.    Dispense:  180  tablet    Refill:  3    05/05/2020 dose increase    Disposition:  Follow up 6 months in the [place] office.  Signed, Jonelle Sidle, MD, [facility] 05/05/2020 11:32 AM    [facility] at [facility] [address] [place], [place], [place] [address] Phone: [phone]; Fax: [phone]

## 2020-05-05 ENCOUNTER — Encounter: Payer: Self-pay | Admitting: Cardiology

## 2020-05-05 ENCOUNTER — Ambulatory Visit: Payer: Medicare Other | Admitting: Cardiology

## 2020-05-05 VITALS — BP 160/74 | HR 88 | Ht 63.0 in | Wt 168.0 lb

## 2020-05-05 DIAGNOSIS — I428 Other cardiomyopathies: Secondary | ICD-10-CM | POA: Diagnosis not present

## 2020-05-05 DIAGNOSIS — I10 Essential (primary) hypertension: Secondary | ICD-10-CM | POA: Diagnosis not present

## 2020-05-05 DIAGNOSIS — I480 Paroxysmal atrial fibrillation: Secondary | ICD-10-CM | POA: Diagnosis not present

## 2020-05-05 MED ORDER — CARVEDILOL 12.5 MG PO TABS
12.5000 mg | ORAL_TABLET | Freq: Two times a day (BID) | ORAL | 3 refills | Status: DC
Start: 2020-05-05 — End: 2020-09-23

## 2020-05-05 NOTE — Patient Instructions (Addendum)
Medication Instructions:   Your physician has recommended you make the following change in your medication:   Increase carvedilol to 12.5 mg by mouth twice daily. You may take (2) of your 6.25 mg tablets twice daily until they are finished Continue other medications the same.  Labwork:  None  Testing/Procedures:  None  Follow-Up:  Your physician recommends that you schedule a follow-up appointment in: 6 months.  Any Other Special Instructions Will Be Listed Below (If Applicable).  If you need a refill on your cardiac medications before your next appointment, please call your pharmacy.

## 2020-06-17 ENCOUNTER — Telehealth: Payer: Self-pay | Admitting: Cardiology

## 2020-06-17 NOTE — Telephone Encounter (Signed)
Says [facility] don't have dose increase of carvedilol to 12.5 mg BID on 05/05/2020. Advised new rx sent to [facility] on 05/05/2020 with receipt that it was received. Advised that we would resend to their pharmacy

## 2020-06-17 NOTE — Telephone Encounter (Signed)
Pt is needing to speak w/ someone about her Rx for her carvedilol (COREG) 12.5 MG tablet [[id number]]

## 2020-07-21 ENCOUNTER — Other Ambulatory Visit: Payer: Self-pay | Admitting: Cardiology

## 2020-07-27 DIAGNOSIS — E559 Vitamin D deficiency, unspecified: Secondary | ICD-10-CM | POA: Diagnosis not present

## 2020-07-27 DIAGNOSIS — D509 Iron deficiency anemia, unspecified: Secondary | ICD-10-CM | POA: Diagnosis not present

## 2020-07-27 DIAGNOSIS — I509 Heart failure, unspecified: Secondary | ICD-10-CM | POA: Diagnosis not present

## 2020-07-27 DIAGNOSIS — E0821 Diabetes mellitus due to underlying condition with diabetic nephropathy: Secondary | ICD-10-CM | POA: Diagnosis not present

## 2020-07-27 DIAGNOSIS — J449 Chronic obstructive pulmonary disease, unspecified: Secondary | ICD-10-CM | POA: Diagnosis not present

## 2020-07-29 DIAGNOSIS — D509 Iron deficiency anemia, unspecified: Secondary | ICD-10-CM | POA: Diagnosis not present

## 2020-07-29 DIAGNOSIS — I509 Heart failure, unspecified: Secondary | ICD-10-CM | POA: Diagnosis not present

## 2020-07-29 DIAGNOSIS — J449 Chronic obstructive pulmonary disease, unspecified: Secondary | ICD-10-CM | POA: Diagnosis not present

## 2020-07-29 DIAGNOSIS — E0821 Diabetes mellitus due to underlying condition with diabetic nephropathy: Secondary | ICD-10-CM | POA: Diagnosis not present

## 2020-07-29 DIAGNOSIS — E559 Vitamin D deficiency, unspecified: Secondary | ICD-10-CM | POA: Diagnosis not present

## 2020-09-23 ENCOUNTER — Other Ambulatory Visit: Payer: Self-pay

## 2020-09-23 ENCOUNTER — Other Ambulatory Visit: Payer: Self-pay | Admitting: Cardiology

## 2020-09-23 MED ORDER — CARVEDILOL 12.5 MG PO TABS: 12.5000 mg | ORAL_TABLET | Freq: Two times a day (BID) | ORAL | 3 refills | Status: DC

## 2020-11-08 DIAGNOSIS — E0821 Diabetes mellitus due to underlying condition with diabetic nephropathy: Secondary | ICD-10-CM | POA: Diagnosis not present

## 2020-11-08 DIAGNOSIS — E871 Hypo-osmolality and hyponatremia: Secondary | ICD-10-CM | POA: Diagnosis not present

## 2020-11-08 DIAGNOSIS — D509 Iron deficiency anemia, unspecified: Secondary | ICD-10-CM | POA: Diagnosis not present

## 2020-11-08 DIAGNOSIS — E782 Mixed hyperlipidemia: Secondary | ICD-10-CM | POA: Diagnosis not present

## 2020-11-08 DIAGNOSIS — E559 Vitamin D deficiency, unspecified: Secondary | ICD-10-CM | POA: Diagnosis not present

## 2020-11-11 DIAGNOSIS — E871 Hypo-osmolality and hyponatremia: Secondary | ICD-10-CM | POA: Diagnosis not present

## 2020-11-11 DIAGNOSIS — I10 Essential (primary) hypertension: Secondary | ICD-10-CM | POA: Diagnosis not present

## 2020-11-11 DIAGNOSIS — I420 Dilated cardiomyopathy: Secondary | ICD-10-CM | POA: Diagnosis not present

## 2020-11-11 DIAGNOSIS — I509 Heart failure, unspecified: Secondary | ICD-10-CM | POA: Diagnosis not present

## 2020-11-11 DIAGNOSIS — I5042 Chronic combined systolic (congestive) and diastolic (congestive) heart failure: Secondary | ICD-10-CM | POA: Diagnosis not present

## 2020-11-11 DIAGNOSIS — N1832 Chronic kidney disease, stage 3b: Secondary | ICD-10-CM | POA: Diagnosis not present

## 2020-11-11 DIAGNOSIS — D509 Iron deficiency anemia, unspecified: Secondary | ICD-10-CM | POA: Diagnosis not present

## 2020-11-11 DIAGNOSIS — E782 Mixed hyperlipidemia: Secondary | ICD-10-CM | POA: Diagnosis not present

## 2020-11-11 DIAGNOSIS — J449 Chronic obstructive pulmonary disease, unspecified: Secondary | ICD-10-CM | POA: Diagnosis not present

## 2020-11-11 DIAGNOSIS — E0821 Diabetes mellitus due to underlying condition with diabetic nephropathy: Secondary | ICD-10-CM | POA: Diagnosis not present

## 2020-11-11 DIAGNOSIS — K219 Gastro-esophageal reflux disease without esophagitis: Secondary | ICD-10-CM | POA: Diagnosis not present

## 2020-11-11 DIAGNOSIS — E559 Vitamin D deficiency, unspecified: Secondary | ICD-10-CM | POA: Diagnosis not present

## 2020-11-12 ENCOUNTER — Encounter: Payer: Self-pay | Admitting: Cardiology

## 2020-11-28 DIAGNOSIS — I10 Essential (primary) hypertension: Secondary | ICD-10-CM | POA: Diagnosis not present

## 2020-11-28 DIAGNOSIS — K219 Gastro-esophageal reflux disease without esophagitis: Secondary | ICD-10-CM | POA: Diagnosis not present

## 2020-11-29 ENCOUNTER — Telehealth: Payer: Self-pay | Admitting: Cardiology

## 2020-11-29 ENCOUNTER — Other Ambulatory Visit: Payer: Self-pay | Admitting: Cardiology

## 2020-11-29 MED ORDER — LOSARTAN POTASSIUM 50 MG PO TABS: 50.0000 mg | ORAL_TABLET | Freq: Two times a day (BID) | ORAL | 0 refills | Status: DC

## 2020-11-29 NOTE — Telephone Encounter (Signed)
Medication refill request approved for Cozaar 50 mg tablets (#30). Sent to [facility]. Pt needs a follow up appointment for further refills.

## 2020-11-29 NOTE — Telephone Encounter (Signed)
New message      *STAT* If patient is at the pharmacy, call can be transferred to refill team.   1. Which medications need to be refilled? (please list name of each medication and dose if known) losartan (COZAAR) 50 MG tablet(Expired)  2. Which pharmacy/location (including street and city if local pharmacy) is medication to be sent to? [facility] [address]  3. Do they need a 30 day or 90 day supply? 90

## 2020-12-23 ENCOUNTER — Encounter: Payer: Self-pay | Admitting: Cardiology

## 2020-12-23 ENCOUNTER — Other Ambulatory Visit: Payer: Self-pay

## 2020-12-23 ENCOUNTER — Other Ambulatory Visit: Payer: Self-pay | Admitting: Cardiology

## 2020-12-23 ENCOUNTER — Ambulatory Visit: Payer: Medicare Other | Admitting: Cardiology

## 2020-12-23 VITALS — BP 126/76 | HR 80 | Ht 63.0 in | Wt 168.0 lb

## 2020-12-23 DIAGNOSIS — I10 Essential (primary) hypertension: Secondary | ICD-10-CM

## 2020-12-23 DIAGNOSIS — D6869 Other thrombophilia: Secondary | ICD-10-CM | POA: Diagnosis not present

## 2020-12-23 DIAGNOSIS — I480 Paroxysmal atrial fibrillation: Secondary | ICD-10-CM | POA: Diagnosis not present

## 2020-12-23 DIAGNOSIS — I428 Other cardiomyopathies: Secondary | ICD-10-CM

## 2020-12-23 MED ORDER — ELIQUIS 5 MG PO TABS: ORAL_TABLET | ORAL | 11 refills | Status: DC

## 2020-12-23 MED ORDER — SPIRONOLACTONE 25 MG PO TABS: ORAL_TABLET | ORAL | 3 refills | Status: DC

## 2020-12-23 MED ORDER — LOSARTAN POTASSIUM 50 MG PO TABS: 50.0000 mg | ORAL_TABLET | Freq: Two times a day (BID) | ORAL | 0 refills | Status: DC

## 2020-12-23 MED ORDER — FUROSEMIDE 40 MG PO TABS: ORAL_TABLET | ORAL | 3 refills | Status: DC

## 2020-12-23 NOTE — Progress Notes (Signed)
Cardiology Office Note  Date: 12/23/2020   ID: Leslie Kim, DOB 05/24/1936, MRN [id number]  PCP:  Benita Stabile, MD  Cardiologist:  Nona Dell, MD Electrophysiologist:  None   Chief Complaint  Patient presents with  . Cardiac follow-up    History of Present Illness: Leslie Kim is an 85 y.o. female last seen in October 2021.  She presents for a routine visit.  Since last encounter she cut her Coreg back from 12.5 mg twice daily to 6.25 mg twice daily, did not tolerate the higher dose due to nausea.  She has maintained her remaining regimen as noted below.  Weight has been stable, she reports no active leg swelling.  NYHA class II dyspnea and no sense of palpitations.  I reviewed her lab work from April as noted below.  She is chronically anemic, on iron supplementation.  No reported melena or hematochezia.  I personally reviewed her ECG today which shows sinus rhythm with IVCD and repolarization abnormalities.  Past Medical History:  Diagnosis Date  . Allergy   . Cataract   . CHF (congestive heart failure) (HCC)   . COPD (chronic obstructive pulmonary disease) (HCC)   . Essential hypertension   . New onset atrial fibrillation (HCC) 11/2015   a. s/p DCCV on 12/18/2015 b. started on Eliquis  . Nonischemic cardiomyopathy (HCC)    a. 11/2015: echo showing EF of 30-35%, cath with nonobstructive dz  . Type 2 diabetes mellitus (HCC)     Past Surgical History:  Procedure Laterality Date  . CARDIAC CATHETERIZATION N/A 12/23/2015   Procedure: Right/Left Heart Cath and Coronary Angiography;  Surgeon: Laurey Morale, MD;  Location: [facility] INVASIVE CV LAB;  Service: Cardiovascular;  Laterality: N/A;  . cataract surgery    . CHOLECYSTECTOMY      Current Outpatient Medications  Medication Sig Dispense Refill  . acetaminophen (TYLENOL) 500 MG tablet Take 1,000 mg by mouth every 6 (six) hours as needed. Pain    . b complex vitamins tablet Take 1 tablet by mouth every other day.     . carvedilol (COREG) 6.25 MG tablet Take 6.25 mg by mouth 2 (two) times daily.    . cholecalciferol (VITAMIN D3) 25 MCG (1000 UNIT) tablet Take 1,000 Units by mouth daily.    Marland Kitchen esomeprazole (NEXIUM) 20 MG capsule Take 20 mg by mouth daily.     . ferrous sulfate 324 MG TBEC Take 1 tablet (324 mg total) by mouth daily with breakfast. 30 tablet 1  . Melatonin 3 MG TABS Take 2 tablets (6 mg total) by mouth at bedtime. 30 tablet 1  . Multiple Vitamin (MULTIVITAMIN WITH MINERALS) TABS tablet Take 1 tablet by mouth daily. 30 tablet 0  . PROAIR HFA 108 (90 Base) MCG/ACT inhaler Inhale 2 puffs into the lungs 4 (four) times daily as needed for wheezing or shortness of breath.   12  . sitaGLIPtin-metformin (JANUMET) 50-1000 MG tablet Take 1 tablet by mouth 2 (two) times daily. 180 tablet 1  . TRELEGY ELLIPTA 100-62.5-25 MCG/INH AEPB INHALE 1 PUFF INTO THE LUNGS ONCE DAILY. 60 each 0  . apixaban (ELIQUIS) 5 MG TABS tablet TAKE (1) TABLET BY MOUTH TWICE DAILY. 60 tablet 11  . furosemide (LASIX) 40 MG tablet TAKE 1 TABLET(40 MG) BY MOUTH DAILY 90 tablet 3  . losartan (COZAAR) 50 MG tablet Take 1 tablet (50 mg total) by mouth 2 (two) times daily. 60 tablet 0  . spironolactone (ALDACTONE) 25 MG tablet  TAKE 1/2 TABLET (12.5MG ) BY MOUTH ONCE DAILY. 45 tablet 3   No current facility-administered medications for this visit.   Allergies:  Codeine   ROS: No orthopnea or PND.  Physical Exam: VS:  BP 126/76   Pulse 80   Ht 5\' 3"  (1.6 m)   Wt 168 lb (76.2 kg)   SpO2 96%   BMI 29.76 kg/m , BMI Body mass index is 29.76 kg/m.  Wt Readings from Last 3 Encounters:  12/23/20 168 lb (76.2 kg)  05/05/20 168 lb (76.2 kg)  12/12/19 169 lb 14.4 oz (77.1 kg)    General: Elderly woman, appears comfortable at rest. HEENT: Conjunctiva and lids normal, wearing a mask. Neck: Supple, no elevated JVP or carotid bruits, no thyromegaly. Lungs: Clear to auscultation, nonlabored breathing at rest. Cardiac: Regular rate  and rhythm, no S3, 1/6 systolic murmur, no pericardial rub. Extremities: No pitting edema.  ECG:  An ECG dated 08/12/2019 was personally reviewed today and demonstrated:  Sinus rhythm with IVCD consistent with left bundle branch block.  Recent Labwork:  April 2022: Hemoglobin 10.3, platelets 279, BUN 26, creatinine 1.27, potassium 4.7, AST 16, ALT 11, cholesterol 238, HDL 52, LDL 155, hemoglobin A1c 6.6%  Other Studies Reviewed Today:  Echocardiogram 04/28/2020: 1. Left ventricular ejection fraction, by estimation, is 50 to 55%. The  left ventricle has normal function. The left ventricle has no regional  wall motion abnormalities. Left ventricular diastolic parameters are  consistent with Grade I diastolic  dysfunction (impaired relaxation). Elevated left atrial pressure.  2. Right ventricular systolic function is normal. The right ventricular  size is normal.  3. The mitral valve is normal in structure. No evidence of mitral valve  regurgitation. No evidence of mitral stenosis.  4. The aortic valve was not well visualized. There is mild calcification  of the aortic valve. There is mild thickening of the aortic valve. Aortic  valve regurgitation is mild. No aortic stenosis is present.  5. The inferior vena cava is normal in size with greater than 50%  respiratory variability, suggesting right atrial pressure of 3 mmHg.  Assessment and Plan:  1.  Nonischemic cardiomyopathy, LVEF 50 to 55% range on current regimen.  She has not tolerated Entresto, nor higher dose Coreg.  Continue with present doses of Coreg, losartan, Aldactone, and Lasix. Recent lab work reviewed.  2.  Paroxysmal atrial fibrillation with CHA2DS2-VASc score of 7.  She is on Eliquis for stroke prophylaxis, no sense of palpitations.  She is in sinus rhythm today by ECG.  3.  Acquired thrombophilia, on Eliquis for stroke prophylaxis.  No active bleeding problems, she has a history of chronic anemia and is on iron  supplementation.  4.  Essential hypertension, blood pressure well controlled today.  Medication Adjustments/Labs and Tests Ordered: Current medicines are reviewed at length with the patient today.  Concerns regarding medicines are outlined above.   Tests Ordered: Orders Placed This Encounter  Procedures  . EKG 12-Lead    Medication Changes: Meds ordered this encounter  Medications  . spironolactone (ALDACTONE) 25 MG tablet    Sig: TAKE 1/2 TABLET (12.5MG ) BY MOUTH ONCE DAILY.    Dispense:  45 tablet    Refill:  3  . losartan (COZAAR) 50 MG tablet    Sig: Take 1 tablet (50 mg total) by mouth 2 (two) times daily.    Dispense:  60 tablet    Refill:  0    Pt needs follow up appointment for further refills.  04/30/2020  furosemide (LASIX) 40 MG tablet    Sig: TAKE 1 TABLET(40 MG) BY MOUTH DAILY    Dispense:  90 tablet    Refill:  3  . apixaban (ELIQUIS) 5 MG TABS tablet    Sig: TAKE (1) TABLET BY MOUTH TWICE DAILY.    Dispense:  60 tablet    Refill:  11    Disposition:  Follow up 6 months.  Signed, Jonelle Sidle, MD, [facility] 12/23/2020 1:28 PM    [facility] at [facility] [address] [place], [place] [address] Phone: [phone]; Fax: [phone]

## 2020-12-23 NOTE — Patient Instructions (Signed)
Medication Instructions:  Your physician recommends that you continue on your current medications as directed. Please refer to the Current Medication list given to you today.  *If you need a refill on your cardiac medications before your next appointment, please call your pharmacy*   Lab Work: Your physician recommends that you continue on your current medications as directed. Please refer to the Current Medication list given to you today.  If you have labs (blood work) drawn today and your tests are completely normal, you will receive your results only by: . MyChart Message (if you have MyChart) OR . A paper copy in the mail If you have any lab test that is abnormal or we need to change your treatment, we will call you to review the results.   Testing/Procedures: None today   Follow-Up: At [facility], you and your health needs are our priority.  As part of our continuing mission to provide you with exceptional heart care, we have created designated Provider Care Teams.  These Care Teams include your primary Cardiologist (physician) and Advanced Practice Providers (APPs -  Physician Assistants and Nurse Practitioners) who all work together to provide you with the care you need, when you need it.  We recommend signing up for the patient portal called "MyChart".  Sign up information is provided on this After Visit Summary.  MyChart is used to connect with patients for Virtual Visits (Telemedicine).  Patients are able to view lab/test results, encounter notes, upcoming appointments, etc.  Non-urgent messages can be sent to your provider as well.   To learn more about what you can do with MyChart, go to [email or web address].    Your next appointment:   6 month(s)  The format for your next appointment:   In Person  Provider:   Samuel McDowell, MD   Other Instructions None    

## 2020-12-28 ENCOUNTER — Other Ambulatory Visit: Payer: Self-pay | Admitting: Cardiology

## 2021-01-27 DIAGNOSIS — I10 Essential (primary) hypertension: Secondary | ICD-10-CM | POA: Diagnosis not present

## 2021-01-27 DIAGNOSIS — K219 Gastro-esophageal reflux disease without esophagitis: Secondary | ICD-10-CM | POA: Diagnosis not present

## 2021-02-15 DIAGNOSIS — E782 Mixed hyperlipidemia: Secondary | ICD-10-CM | POA: Diagnosis not present

## 2021-02-15 DIAGNOSIS — E0821 Diabetes mellitus due to underlying condition with diabetic nephropathy: Secondary | ICD-10-CM | POA: Diagnosis not present

## 2021-02-15 DIAGNOSIS — I10 Essential (primary) hypertension: Secondary | ICD-10-CM | POA: Diagnosis not present

## 2021-02-22 DIAGNOSIS — I10 Essential (primary) hypertension: Secondary | ICD-10-CM | POA: Diagnosis not present

## 2021-02-22 DIAGNOSIS — K219 Gastro-esophageal reflux disease without esophagitis: Secondary | ICD-10-CM | POA: Diagnosis not present

## 2021-02-22 DIAGNOSIS — I420 Dilated cardiomyopathy: Secondary | ICD-10-CM | POA: Diagnosis not present

## 2021-02-22 DIAGNOSIS — J449 Chronic obstructive pulmonary disease, unspecified: Secondary | ICD-10-CM | POA: Diagnosis not present

## 2021-02-22 DIAGNOSIS — E559 Vitamin D deficiency, unspecified: Secondary | ICD-10-CM | POA: Diagnosis not present

## 2021-02-22 DIAGNOSIS — I480 Paroxysmal atrial fibrillation: Secondary | ICD-10-CM | POA: Diagnosis not present

## 2021-02-22 DIAGNOSIS — E1169 Type 2 diabetes mellitus with other specified complication: Secondary | ICD-10-CM | POA: Diagnosis not present

## 2021-02-22 DIAGNOSIS — R809 Proteinuria, unspecified: Secondary | ICD-10-CM | POA: Diagnosis not present

## 2021-02-22 DIAGNOSIS — N1832 Chronic kidney disease, stage 3b: Secondary | ICD-10-CM | POA: Diagnosis not present

## 2021-02-22 DIAGNOSIS — D509 Iron deficiency anemia, unspecified: Secondary | ICD-10-CM | POA: Diagnosis not present

## 2021-02-22 DIAGNOSIS — E782 Mixed hyperlipidemia: Secondary | ICD-10-CM | POA: Diagnosis not present

## 2021-03-30 DIAGNOSIS — I10 Essential (primary) hypertension: Secondary | ICD-10-CM | POA: Diagnosis not present

## 2021-03-30 DIAGNOSIS — K219 Gastro-esophageal reflux disease without esophagitis: Secondary | ICD-10-CM | POA: Diagnosis not present

## 2021-04-12 ENCOUNTER — Other Ambulatory Visit: Payer: Self-pay | Admitting: Cardiology

## 2021-04-14 DIAGNOSIS — H524 Presbyopia: Secondary | ICD-10-CM | POA: Diagnosis not present

## 2021-04-14 DIAGNOSIS — Z961 Presence of intraocular lens: Secondary | ICD-10-CM | POA: Diagnosis not present

## 2021-04-14 DIAGNOSIS — H52201 Unspecified astigmatism, right eye: Secondary | ICD-10-CM | POA: Diagnosis not present

## 2021-04-14 DIAGNOSIS — E119 Type 2 diabetes mellitus without complications: Secondary | ICD-10-CM | POA: Diagnosis not present

## 2021-04-14 DIAGNOSIS — H5211 Myopia, right eye: Secondary | ICD-10-CM | POA: Diagnosis not present

## 2021-04-29 DIAGNOSIS — K219 Gastro-esophageal reflux disease without esophagitis: Secondary | ICD-10-CM | POA: Diagnosis not present

## 2021-04-29 DIAGNOSIS — I10 Essential (primary) hypertension: Secondary | ICD-10-CM | POA: Diagnosis not present

## 2021-06-29 DIAGNOSIS — I10 Essential (primary) hypertension: Secondary | ICD-10-CM | POA: Diagnosis not present

## 2021-06-29 DIAGNOSIS — E782 Mixed hyperlipidemia: Secondary | ICD-10-CM | POA: Diagnosis not present

## 2021-06-30 DIAGNOSIS — R7301 Impaired fasting glucose: Secondary | ICD-10-CM | POA: Diagnosis not present

## 2021-06-30 DIAGNOSIS — E559 Vitamin D deficiency, unspecified: Secondary | ICD-10-CM | POA: Diagnosis not present

## 2021-06-30 DIAGNOSIS — D509 Iron deficiency anemia, unspecified: Secondary | ICD-10-CM | POA: Diagnosis not present

## 2021-06-30 DIAGNOSIS — E782 Mixed hyperlipidemia: Secondary | ICD-10-CM | POA: Diagnosis not present

## 2021-07-01 ENCOUNTER — Ambulatory Visit: Payer: Medicare Other | Admitting: Cardiology

## 2021-07-05 ENCOUNTER — Encounter: Payer: Self-pay | Admitting: Cardiology

## 2021-07-05 ENCOUNTER — Encounter: Payer: Self-pay | Admitting: *Deleted

## 2021-07-05 ENCOUNTER — Ambulatory Visit: Payer: Medicare Other | Admitting: Cardiology

## 2021-07-05 VITALS — BP 158/60 | HR 82 | Ht 63.0 in | Wt 165.0 lb

## 2021-07-05 DIAGNOSIS — I428 Other cardiomyopathies: Secondary | ICD-10-CM

## 2021-07-05 DIAGNOSIS — I480 Paroxysmal atrial fibrillation: Secondary | ICD-10-CM | POA: Diagnosis not present

## 2021-07-05 NOTE — Patient Instructions (Addendum)

## 2021-07-05 NOTE — Progress Notes (Signed)
Cardiology Office Note  Date: 07/05/2021   ID: Leslie Kim, DOB 06-12-1936, MRN [id number]  PCP:  Celene Squibb, MD  Cardiologist:  Rozann Lesches, MD Electrophysiologist:  None   Chief Complaint  Patient presents with   Cardiac follow-up    History of Present Illness: Leslie Kim is an 85 y.o. female last seen in May.  She is here for a follow-up visit.  She does not report any sense of palpitations, stable NYHA class II dyspnea with typical activities.  She has been having some arthritic knee pain.  I reviewed her medications which are stable from a cardiac perspective and noted below.  LVEF was 50 to 55% by echocardiogram in September of last year.  She just saw Dr. Nevada Crane earlier this morning, had recent lab work which we are requesting.  She does not report any spontaneous bleeding problems on Eliquis.  Past Medical History:  Diagnosis Date   Allergy    Cataract    CHF (congestive heart failure) (HCC)    COPD (chronic obstructive pulmonary disease) ([place])    Essential hypertension    New onset atrial fibrillation ([place]) 11/2015   a. s/p DCCV on 12/18/2015 b. started on Eliquis   Nonischemic cardiomyopathy ([place])    a. 11/2015: echo showing EF of 30-35%, cath with nonobstructive dz   Type 2 diabetes mellitus ([place])     Past Surgical History:  Procedure Laterality Date   CARDIAC CATHETERIZATION N/A 12/23/2015   Procedure: Right/Left Heart Cath and Coronary Angiography;  Surgeon: Larey Dresser, MD;  Location: [place] CV LAB;  Service: Cardiovascular;  Laterality: N/A;   cataract surgery     CHOLECYSTECTOMY      Current Outpatient Medications  Medication Sig Dispense Refill   acetaminophen (TYLENOL) 500 MG tablet Take 1,000 mg by mouth every 6 (six) hours as needed. Pain     apixaban (ELIQUIS) 5 MG TABS tablet TAKE (1) TABLET BY MOUTH TWICE DAILY. 60 tablet 11   b complex vitamins tablet Take 1 tablet by mouth every other day.     carvedilol (COREG) 6.25 MG  tablet TAKE 1 TABLET BY MOUTH TWICE DAILY WITH MEAL 180 tablet 3   cholecalciferol (VITAMIN D3) 25 MCG (1000 UNIT) tablet Take 1,000 Units by mouth daily.     esomeprazole (NEXIUM) 20 MG capsule Take 20 mg by mouth daily.      ferrous sulfate 324 MG TBEC Take 1 tablet (324 mg total) by mouth daily with breakfast. 30 tablet 1   furosemide (LASIX) 40 MG tablet TAKE 1 TABLET(40 MG) BY MOUTH DAILY 90 tablet 3   losartan (COZAAR) 50 MG tablet TAKE 1 TABLET(50 MG) BY MOUTH TWICE DAILY 180 tablet 3   Melatonin 3 MG TABS Take 2 tablets (6 mg total) by mouth at bedtime. 30 tablet 1   Multiple Vitamin (MULTIVITAMIN WITH MINERALS) TABS tablet Take 1 tablet by mouth daily. 30 tablet 0   pravastatin (PRAVACHOL) 10 MG tablet Take 10 mg by mouth daily.     PROAIR HFA 108 (90 Base) MCG/ACT inhaler Inhale 2 puffs into the lungs 4 (four) times daily as needed for wheezing or shortness of breath.   12   sitaGLIPtin-metformin (JANUMET) 50-1000 MG tablet Take 1 tablet by mouth 2 (two) times daily. 180 tablet 1   spironolactone (ALDACTONE) 25 MG tablet TAKE 1/2 TABLET (12.5MG ) BY MOUTH ONCE DAILY. 45 tablet 3   TRELEGY ELLIPTA 100-62.5-25 MCG/INH AEPB INHALE 1 PUFF INTO THE LUNGS  ONCE DAILY. 60 each 0   No current facility-administered medications for this visit.   Allergies:  Codeine   ROS: No syncope.  Physical Exam: VS:  BP (!) 158/60   Pulse 82   Ht 5\' 3"  (1.6 m)   Wt 165 lb (74.8 kg)   SpO2 98%   BMI 29.23 kg/m , BMI Body mass index is 29.23 kg/m.  Wt Readings from Last 3 Encounters:  07/05/21 165 lb (74.8 kg)  12/23/20 168 lb (76.2 kg)  05/05/20 168 lb (76.2 kg)    General: Patient appears comfortable at rest. HEENT: Conjunctiva and lids normal, wearing a mask. Neck: Supple, no elevated JVP or carotid bruits, no thyromegaly. Lungs: Clear to auscultation, nonlabored breathing at rest. Cardiac: Regular rate and rhythm, no S3, 1/6 systolic murmur, no pericardial rub. Extremities: No pitting  edema.  ECG:  An ECG dated 12/23/2020 was personally reviewed today and demonstrated:  Sinus rhythm with IVCD and repolarization abnormalities.  Recent Labwork:  April 2022: Hemoglobin 10.3, platelets 279, BUN 26, creatinine 1.27, potassium 4.7, AST 16, ALT 11, cholesterol 238, HDL 52, LDL 155, hemoglobin A1c 6.6%  Other Studies Reviewed Today:  Echocardiogram 04/28/2020:  1. Left ventricular ejection fraction, by estimation, is 50 to 55%. The  left ventricle has normal function. The left ventricle has no regional  wall motion abnormalities. Left ventricular diastolic parameters are  consistent with Grade I diastolic  dysfunction (impaired relaxation). Elevated left atrial pressure.   2. Right ventricular systolic function is normal. The right ventricular  size is normal.   3. The mitral valve is normal in structure. No evidence of mitral valve  regurgitation. No evidence of mitral stenosis.   4. The aortic valve was not well visualized. There is mild calcification  of the aortic valve. There is mild thickening of the aortic valve. Aortic  valve regurgitation is mild. No aortic stenosis is present.   5. The inferior vena cava is normal in size with greater than 50%  respiratory variability, suggesting right atrial pressure of 3 mmHg.  Assessment and Plan:  1.  History of nonischemic cardiomyopathy with low normal LVEF at 50 to 55% by last assessment.  Prior intolerance to [facility].  She is clinically stable on Coreg, losartan, Aldactone, and Lasix.  No changes were made today  2.  Paroxysmal atrial fibrillation with CHA2DS2-VASc score of 7.  She remains on Eliquis for stroke prophylaxis.  Requesting recent lab work from PCP.[facility]  Medication Adjustments/Labs and Tests Ordered: Current medicines are reviewed at length with the patient today.  Concerns regarding medicines are outlined above.   Tests Ordered: No orders of the defined types were placed in this encounter.   Medication  Changes: No orders of the defined types were placed in this encounter.   Disposition:  Follow up  6 months.  Signed, Marland Kitchen, MD, [facility] 07/05/2021 3:26 PM    [facility] at [facility] [address] [place], [place], [place] Phone: [phone]; Fax: [phone]

## 2021-08-04 IMAGING — DX DG CHEST 1V PORT
1 series · 1 of 1 positions shown · non-contrast
Comparison: December 21, 2015.

CLINICAL DATA: Shortness of breath.

EXAM:
PORTABLE CHEST 1 VIEW

[chest ap]
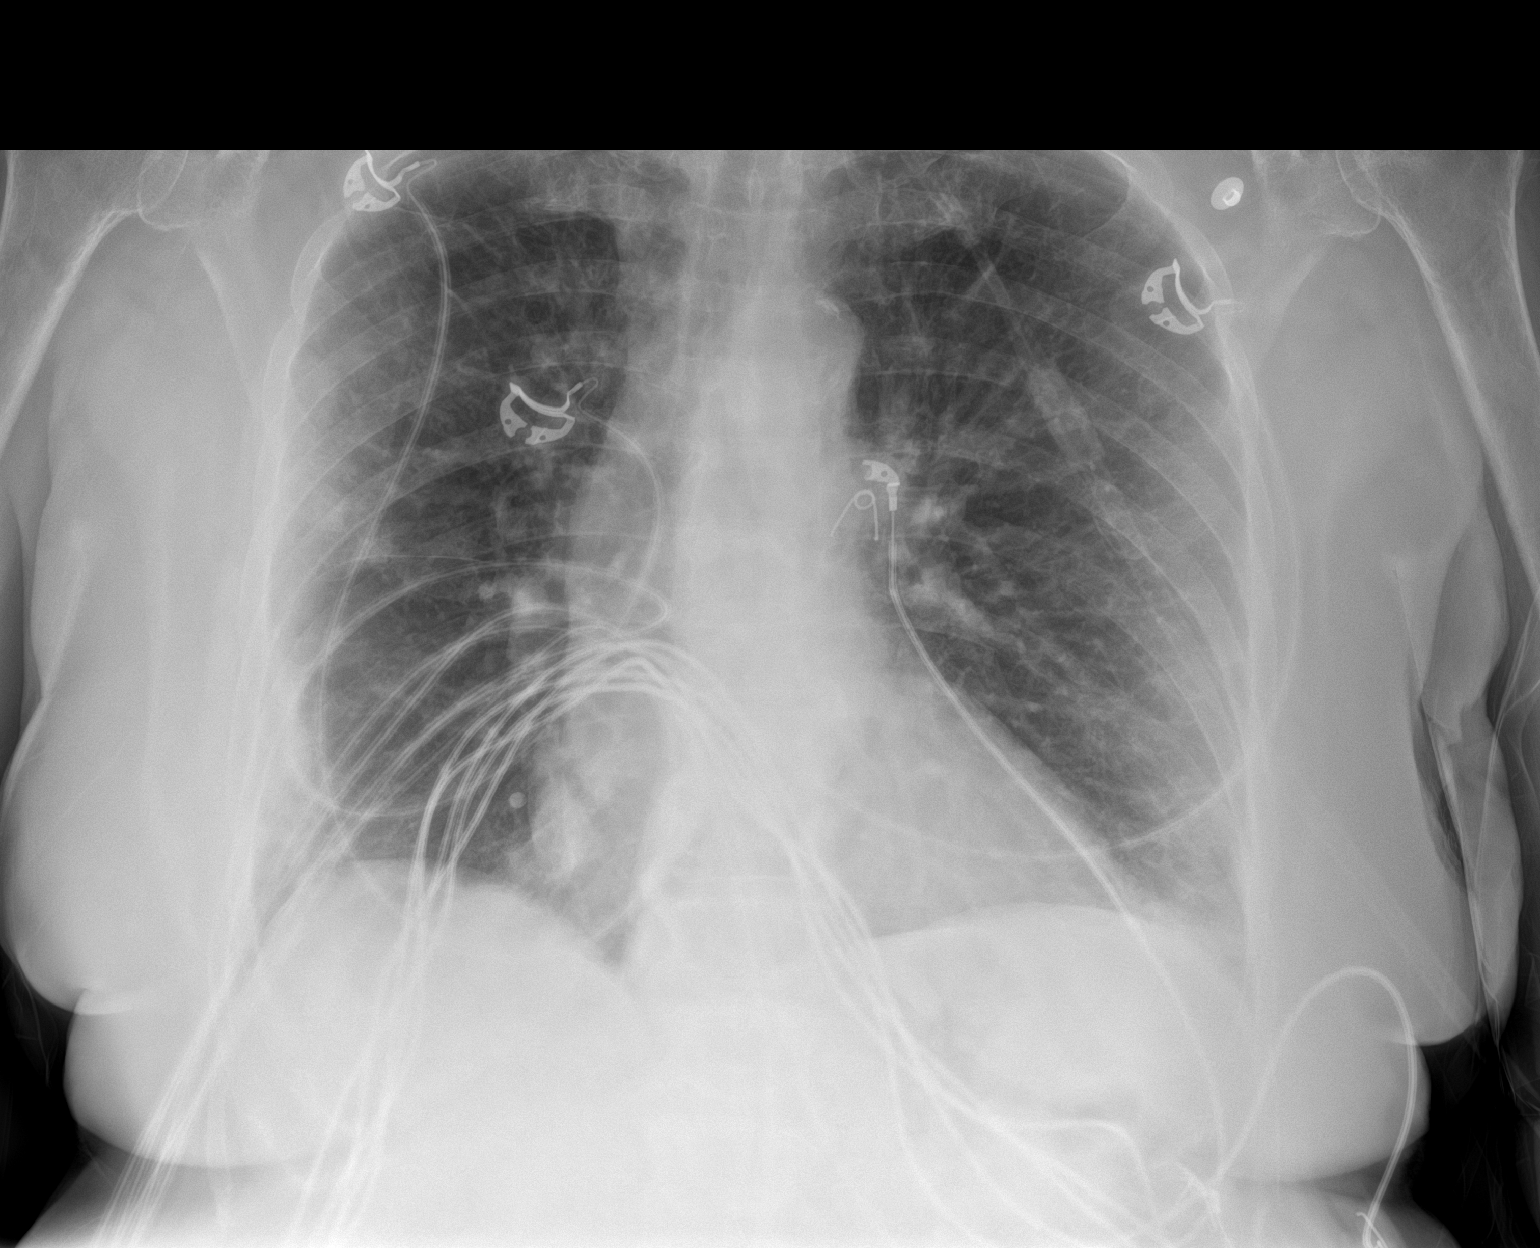

[1 of 1 positions shown; findings below may reference images not displayed]

FINDINGS: Stable cardiomediastinal silhouette. No pneumothorax or pleural
effusion is noted. Mild ill-defined opacities are noted peripherally
in both lungs, particularly in the right, concerning for multifocal
pneumonia. Bony thorax is unremarkable.
IMPRESSION: Mild ill-defined opacities are noted peripherally in both lungs,
particularly in the right lung, concerning for multifocal pneumonia,
particularly of viral etiology.

## 2021-10-13 ENCOUNTER — Other Ambulatory Visit: Payer: Self-pay

## 2021-10-13 ENCOUNTER — Inpatient Hospital Stay (HOSPITAL_COMMUNITY)
Admission: EM | Admit: 2021-10-13 | Discharge: 2021-10-17 | DRG: 065 | Disposition: A | Discharge status: Rehabilitation Facility | Payer: Medicare Other | Attending: Internal Medicine | Admitting: Internal Medicine

## 2021-10-13 ENCOUNTER — Emergency Department (HOSPITAL_COMMUNITY): Payer: Medicare Other

## 2021-10-13 ENCOUNTER — Encounter (HOSPITAL_COMMUNITY): Payer: Self-pay

## 2021-10-13 DIAGNOSIS — I10 Essential (primary) hypertension: Secondary | ICD-10-CM | POA: Diagnosis not present

## 2021-10-13 DIAGNOSIS — K219 Gastro-esophageal reflux disease without esophagitis: Secondary | ICD-10-CM | POA: Diagnosis not present

## 2021-10-13 DIAGNOSIS — G8191 Hemiplegia, unspecified affecting right dominant side: Secondary | ICD-10-CM | POA: Diagnosis present

## 2021-10-13 DIAGNOSIS — Z885 Allergy status to narcotic agent status: Secondary | ICD-10-CM

## 2021-10-13 DIAGNOSIS — R29703 NIHSS score 3: Secondary | ICD-10-CM | POA: Diagnosis present

## 2021-10-13 DIAGNOSIS — I6782 Cerebral ischemia: Secondary | ICD-10-CM | POA: Diagnosis not present

## 2021-10-13 DIAGNOSIS — E785 Hyperlipidemia, unspecified: Secondary | ICD-10-CM | POA: Diagnosis not present

## 2021-10-13 DIAGNOSIS — I4891 Unspecified atrial fibrillation: Secondary | ICD-10-CM | POA: Diagnosis present

## 2021-10-13 DIAGNOSIS — R29818 Other symptoms and signs involving the nervous system: Secondary | ICD-10-CM | POA: Diagnosis not present

## 2021-10-13 DIAGNOSIS — I110 Hypertensive heart disease with heart failure: Secondary | ICD-10-CM | POA: Diagnosis present

## 2021-10-13 DIAGNOSIS — E119 Type 2 diabetes mellitus without complications: Secondary | ICD-10-CM | POA: Diagnosis present

## 2021-10-13 DIAGNOSIS — I6381 Other cerebral infarction due to occlusion or stenosis of small artery: Secondary | ICD-10-CM | POA: Diagnosis not present

## 2021-10-13 DIAGNOSIS — J449 Chronic obstructive pulmonary disease, unspecified: Secondary | ICD-10-CM | POA: Diagnosis not present

## 2021-10-13 DIAGNOSIS — Z7951 Long term (current) use of inhaled steroids: Secondary | ICD-10-CM

## 2021-10-13 DIAGNOSIS — Z7984 Long term (current) use of oral hypoglycemic drugs: Secondary | ICD-10-CM | POA: Diagnosis not present

## 2021-10-13 DIAGNOSIS — Z66 Do not resuscitate: Secondary | ICD-10-CM | POA: Diagnosis not present

## 2021-10-13 DIAGNOSIS — I480 Paroxysmal atrial fibrillation: Secondary | ICD-10-CM | POA: Diagnosis not present

## 2021-10-13 DIAGNOSIS — Z79899 Other long term (current) drug therapy: Secondary | ICD-10-CM

## 2021-10-13 DIAGNOSIS — I2510 Atherosclerotic heart disease of native coronary artery without angina pectoris: Secondary | ICD-10-CM | POA: Diagnosis not present

## 2021-10-13 DIAGNOSIS — Z87891 Personal history of nicotine dependence: Secondary | ICD-10-CM

## 2021-10-13 DIAGNOSIS — Z7901 Long term (current) use of anticoagulants: Secondary | ICD-10-CM

## 2021-10-13 DIAGNOSIS — I5032 Chronic diastolic (congestive) heart failure: Secondary | ICD-10-CM | POA: Diagnosis present

## 2021-10-13 DIAGNOSIS — Z20822 Contact with and (suspected) exposure to covid-19: Secondary | ICD-10-CM | POA: Diagnosis not present

## 2021-10-13 DIAGNOSIS — I5030 Unspecified diastolic (congestive) heart failure: Secondary | ICD-10-CM | POA: Diagnosis not present

## 2021-10-13 DIAGNOSIS — I428 Other cardiomyopathies: Secondary | ICD-10-CM | POA: Diagnosis present

## 2021-10-13 DIAGNOSIS — I639 Cerebral infarction, unspecified: Principal | ICD-10-CM | POA: Diagnosis present

## 2021-10-13 DIAGNOSIS — Z833 Family history of diabetes mellitus: Secondary | ICD-10-CM | POA: Diagnosis not present

## 2021-10-13 LAB — CBG MONITORING, ED: Glucose-Capillary: 156 mg/dL — ABNORMAL HIGH (ref 70–99)

## 2021-10-13 LAB — DIFFERENTIAL
Abs Immature Granulocytes: 0.03 10*3/uL (ref 0.00–0.07)
Basophils Absolute: 0 10*3/uL (ref 0.0–0.1)
Basophils Relative: 0 %
Eosinophils Absolute: 0.1 10*3/uL (ref 0.0–0.5)
Eosinophils Relative: 1 %
Immature Granulocytes: 0 %
Lymphocytes Relative: 29 %
Lymphs Abs: 2.5 10*3/uL (ref 0.7–4.0)
Monocytes Absolute: 0.6 10*3/uL (ref 0.1–1.0)
Monocytes Relative: 7 %
Neutro Abs: 5.4 10*3/uL (ref 1.7–7.7)
Neutrophils Relative %: 63 %

## 2021-10-13 LAB — CBC
HCT: 35.4 % — ABNORMAL LOW (ref 36.0–46.0)
Hemoglobin: 10.8 g/dL — ABNORMAL LOW (ref 12.0–15.0)
MCH: 25.9 pg — ABNORMAL LOW (ref 26.0–34.0)
MCHC: 30.5 g/dL (ref 30.0–36.0)
MCV: 84.9 fL (ref 80.0–100.0)
Platelets: 256 10*3/uL (ref 150–400)
RBC: 4.17 MIL/uL (ref 3.87–5.11)
RDW: 15.2 % (ref 11.5–15.5)
WBC: 8.7 10*3/uL (ref 4.0–10.5)
nRBC: 0 % (ref 0.0–0.2)

## 2021-10-13 LAB — RESP PANEL BY RT-PCR (FLU A&B, COVID) ARPGX2
Influenza A by PCR: NEGATIVE
Influenza B by PCR: NEGATIVE
SARS Coronavirus 2 by RT PCR: NEGATIVE

## 2021-10-13 LAB — COMPREHENSIVE METABOLIC PANEL
ALT: 14 U/L (ref 0–44)
AST: 19 U/L (ref 15–41)
Albumin: 3.9 g/dL (ref 3.5–5.0)
Alkaline Phosphatase: 55 U/L (ref 38–126)
Anion gap: 11 (ref 5–15)
BUN: 30 mg/dL — ABNORMAL HIGH (ref 8–23)
CO2: 23 mmol/L (ref 22–32)
Calcium: 9.6 mg/dL (ref 8.9–10.3)
Chloride: 99 mmol/L (ref 98–111)
Creatinine, Ser: 1.33 mg/dL — ABNORMAL HIGH (ref 0.44–1.00)
GFR, Estimated: 40 mL/min — ABNORMAL LOW (ref >60)
Glucose, Bld: 155 mg/dL — ABNORMAL HIGH (ref 70–99)
Potassium: 3.9 mmol/L (ref 3.5–5.1)
Sodium: 133 mmol/L — ABNORMAL LOW (ref 135–145)
Total Bilirubin: 0.4 mg/dL (ref 0.3–1.2)
Total Protein: 8.2 g/dL — ABNORMAL HIGH (ref 6.5–8.1)

## 2021-10-13 LAB — APTT: aPTT: 33 seconds (ref 24–36)

## 2021-10-13 LAB — PROTIME-INR
INR: 1.2 (ref 0.8–1.2)
Prothrombin Time: 14.9 seconds (ref 11.4–15.2)

## 2021-10-13 LAB — ETHANOL: Alcohol, Ethyl (B): <10 mg/dL (ref <10)

## 2021-10-13 NOTE — ED Provider Notes (Signed)
[facility] EMERGENCY DEPARTMENT Provider Note   CSN: [id number] Arrival date & time: 10/13/21  1941     History  Chief Complaint  Patient presents with   Right Sided Weakness    Leslie Kim is a 86 y.o. female.  HPI  This patient is an 86 year old female, she has a history of being on Eliquis though she cannot tell me why.  She recently lost her husband, that was approximately 5 days ago.  She 2 days ago started to have some difficulty using her right arm, she went to bed that night and woke up the next morning and it was still there, throughout the entire day yesterday she had ongoing symptoms and today was even worse with some difficulty walking because of right leg weakness.  No difficulty with speaking, no facial droop, the son who accompanies her and who is an additional historian confirms the patient's story as well as her baseline.  Home Medications Prior to Admission medications   Medication Sig Start Date End Date Taking? Authorizing Provider  acetaminophen (TYLENOL) 500 MG tablet Take 1,000 mg by mouth every 6 (six) hours as needed. Pain   Yes [provider]  apixaban (ELIQUIS) 5 MG TABS tablet TAKE (1) TABLET BY MOUTH TWICE DAILY. 12/23/20  Yes Jonelle Sidle, MD  b complex vitamins tablet Take 1 tablet by mouth every other day.   Yes [provider]  carvedilol (COREG) 6.25 MG tablet TAKE 1 TABLET BY MOUTH TWICE DAILY WITH MEAL Patient taking differently: Take 6.25 mg by mouth 2 (two) times daily with a meal. 04/12/21  Yes Jonelle Sidle, MD  cholecalciferol (VITAMIN D3) 25 MCG (1000 UNIT) tablet Take 2,000 Units by mouth daily.   Yes [provider]  diphenhydrAMINE (BENADRYL) 25 MG tablet Take 25 mg by mouth every 6 (six) hours as needed for allergies.   Yes [provider]  esomeprazole (NEXIUM) 20 MG capsule Take 20 mg by mouth daily.    Yes [provider]  ferrous sulfate 324 MG TBEC Take 1 tablet (324 mg total)  by mouth daily with breakfast. 12/04/19  Yes Corum, Minerva Fester, MD  furosemide (LASIX) 40 MG tablet TAKE 1 TABLET(40 MG) BY MOUTH DAILY 12/23/20  Yes Jonelle Sidle, MD  losartan (COZAAR) 50 MG tablet TAKE 1 TABLET(50 MG) BY MOUTH TWICE DAILY Patient taking differently: Take 50 mg by mouth 2 (two) times daily. 12/23/20  Yes Jonelle Sidle, MD  Melatonin 3 MG TABS Take 2 tablets (6 mg total) by mouth at bedtime. Patient taking differently: Take 5 mg by mouth at bedtime. 08/14/19  Yes Shon Hale, MD  PROAIR HFA 108 (90 Base) MCG/ACT inhaler Inhale 2 puffs into the lungs 4 (four) times daily as needed for wheezing or shortness of breath.  09/07/17  Yes [provider]  sitaGLIPtin-metformin (JANUMET) 50-1000 MG tablet Take 1 tablet by mouth 2 (two) times daily. 10/21/19  Yes Corum, Minerva Fester, MD  spironolactone (ALDACTONE) 25 MG tablet TAKE 1/2 TABLET (12.5MG ) BY MOUTH ONCE DAILY. 12/23/20  Yes Jonelle Sidle, MD  TRELEGY ELLIPTA 100-62.5-25 MCG/INH AEPB INHALE 1 PUFF INTO THE LUNGS ONCE DAILY. Patient taking differently: Inhale 1 puff into the lungs every evening. 02/19/20  Yes Jonelle Sidle, MD  Multiple Vitamin (MULTIVITAMIN WITH MINERALS) TABS tablet Take 1 tablet by mouth daily. Patient not taking: Reported on 10/13/2021 08/15/19   Shon Hale, MD      Allergies    Codeine  Review of Systems   Review of Systems  All other systems reviewed and are negative.  Physical Exam Updated Vital Signs BP (!) 151/67   Pulse 72   Temp 97.9 F (36.6 C) (Oral)   Resp 18   Ht 1.6 m (5\' 3" )   Wt 74.8 kg   SpO2 97%   BMI 29.23 kg/m  Physical Exam Vitals and nursing note reviewed.  Constitutional:      General: She is not in acute distress.    Appearance: She is well-developed.  HENT:     Head: Normocephalic and atraumatic.     Mouth/Throat:     Pharynx: No oropharyngeal exudate.  Eyes:     General: No scleral icterus.       Right eye: No discharge.        Left eye:  No discharge.     Conjunctiva/sclera: Conjunctivae normal.     Pupils: Pupils are equal, round, and reactive to light.  Neck:     Thyroid: No thyromegaly.     Vascular: No JVD.     Comments: No carotid bruit auscultated Cardiovascular:     Rate and Rhythm: Normal rate and regular rhythm.     Heart sounds: Normal heart sounds. No murmur heard.   No friction rub. No gallop.  Pulmonary:     Effort: Pulmonary effort is normal. No respiratory distress.     Breath sounds: Normal breath sounds. No wheezing or rales.  Abdominal:     General: Bowel sounds are normal. There is no distension.     Palpations: Abdomen is soft. There is no mass.     Tenderness: There is no abdominal tenderness.  Musculoskeletal:        General: No tenderness. Normal range of motion.     Cervical back: Normal range of motion and neck supple.  Lymphadenopathy:     Cervical: No cervical adenopathy.  Skin:    General: Skin is warm and dry.     Findings: No erythema or rash.  Neurological:     Mental Status: She is alert.     Motor: Weakness present.     Coordination: Coordination abnormal.     Comments: There is abnormal coordination with the right arm, finger-nose-finger shows dysmetria, she has difficulty doing heel shin on the right as well.  She has a slight drift of the right arm as well as slight drift of the right leg, 4 out of 5 strength on the right compared to the left.  Her speech is totally clear, there is no facial droop, there is no cranial nerve abnormalities 3 through 12.  Psychiatric:        Behavior: Behavior normal.    ED Results / Procedures / Treatments   Labs (all labs ordered are listed, but only abnormal results are displayed) Labs Reviewed  CBC - Abnormal; Notable for the following components:      Result Value   Hemoglobin 10.8 (*)    HCT 35.4 (*)    MCH 25.9 (*)    All other components within normal limits  COMPREHENSIVE METABOLIC PANEL - Abnormal; Notable for the following  components:   Sodium 133 (*)    Glucose, Bld 155 (*)    BUN 30 (*)    Creatinine, Ser 1.33 (*)    Total Protein 8.2 (*)    GFR, Estimated 40 (*)    All other components within normal limits  CBG MONITORING, ED - Abnormal; Notable for the following components:  Glucose-Capillary 156 (*)    All other components within normal limits  RESP PANEL BY RT-PCR (FLU A&B, COVID) ARPGX2  ETHANOL  PROTIME-INR  APTT  DIFFERENTIAL  RAPID URINE DRUG SCREEN, HOSP PERFORMED  URINALYSIS, ROUTINE W REFLEX MICROSCOPIC    EKG None  Radiology CT HEAD WO CONTRAST  Result Date: 10/13/2021 CLINICAL DATA:  Neurologic deficit.  Concern for stroke. EXAM: CT HEAD WITHOUT CONTRAST TECHNIQUE: Contiguous axial images were obtained from the base of the skull through the vertex without intravenous contrast. RADIATION DOSE REDUCTION: This exam was performed according to the departmental dose-optimization program which includes automated exposure control, adjustment of the mA and/or kV according to patient size and/or use of iterative reconstruction technique. COMPARISON:  None. FINDINGS: Brain: The ventricles and sulci are appropriate size for patient's age. Mild periventricular and deep white matter chronic microvascular ischemic changes noted. There is no acute intracranial hemorrhage. No mass effect or midline shift. No extra-axial fluid collection. Vascular: No hyperdense vessel or unexpected calcification. Skull: Normal. Negative for fracture or focal lesion. Sinuses/Orbits: No acute finding. Other: None IMPRESSION: 1. No acute intracranial pathology. 2. Mild chronic microvascular ischemic changes. Electronically Signed   By: Elgie Collard M.D.   On: 10/13/2021 21:08    Procedures Procedures    Medications Ordered in ED Medications - No data to display  ED Course/ Medical Decision Making/ A&P                           Medical Decision Making Amount and/or Complexity of Data Reviewed Labs:  ordered. Radiology: ordered.  Risk Decision regarding hospitalization.   This patient presents to the ED for concern of right-sided weakness, this involves an extensive number of treatment options, and is a complaint that carries with it a high risk of complications and morbidity.  The differential diagnosis includes hypoglycemia, stroke, motor neuron disease, metabolic abnormalities, infections   Co morbidities that complicate the patient evaluation  The patient is already on Eliquis which complicates the evaluation for stroke, additionally she has been having symptoms for more than 48 hours.   Additional history obtained:  Additional history obtained from medical record External records from outside source obtained and reviewed including cardiac notes from Dr. Diona Browner.  She is being treated for nonischemic cardiomyopathy.  She had an ejection fraction of 50 to 55% in September 2022 she has a history of new onset atrial fibrillation in 2017 for which she takes the Eliquis.  She is also a type II diabetic   Lab Tests:  I Ordered, and personally interpreted labs.  The pertinent results include: CBC, metabolic panel, mild anemia but better than usual, INR of 1.2, no significant renal dysfunction, creatinine is 1.3 with baseline that has been that high in the past.  No significant electrolyte disturbances and a blood sugar of 156.  COVID-negative   Imaging Studies ordered:  I ordered imaging studies including CT scan of the brain I independently visualized and interpreted imaging which showed no acute ischemic or hemorrhagic findings, no masses. I agree with the radiologist interpretation   Cardiac Monitoring:  The patient was maintained on a cardiac monitor.  I personally viewed and interpreted the cardiac monitored which showed an underlying rhythm of: Normal sinus rhythm  EKG reveals normal sinus rhythm, wide QRS complex in pattern of left bundle branch block, similar to  prior   Medicines ordered and prescription drug management:   I have reviewed the patients home medicines  and have made adjustments as needed.  I have held the Eliquis at this time   Test Considered:  MRI, this will need to be done as an inpatient   Critical Interventions:  Work-up for stroke or other causes of right-sided weakness   Consultations Obtained:  I requested consultation with the Neuro,  and discussed lab and imaging findings as well as pertinent plan - they recommend: admission and w/u -    Problem List / ED Course:  W/u showed no acute hemorrhage or masses however the patient was seen by neurology and they recommended admission.  I discussed the case with the hospitalist who is also agreed to admit the patient to hospital, I appreciate their willingness to take care of this patient.           Final Clinical Impression(s) / ED Diagnoses Final diagnoses:  Ischemic stroke [facility])  Essential hypertension     Eber Hong, MD 10/13/21 2228

## 2021-10-13 NOTE — ED Triage Notes (Signed)
Pt presents with right sided weakness that started the night before last. Pt states that weakness has stayed the same and has not gotten any better or worse. No facial droop, no difference in hand grip, no blurry vision or headache. Pt A&O x4. No previous history of strokes. ?

## 2021-10-13 NOTE — ED Notes (Signed)
Teleneurology paged to Dr Hyacinth Meeker [phone] ?

## 2021-10-14 ENCOUNTER — Observation Stay (HOSPITAL_COMMUNITY): Payer: Medicare Other

## 2021-10-14 DIAGNOSIS — Z7984 Long term (current) use of oral hypoglycemic drugs: Secondary | ICD-10-CM | POA: Diagnosis not present

## 2021-10-14 DIAGNOSIS — G4700 Insomnia, unspecified: Secondary | ICD-10-CM | POA: Diagnosis not present

## 2021-10-14 DIAGNOSIS — Z20822 Contact with and (suspected) exposure to covid-19: Secondary | ICD-10-CM | POA: Diagnosis present

## 2021-10-14 DIAGNOSIS — I482 Chronic atrial fibrillation, unspecified: Secondary | ICD-10-CM | POA: Diagnosis not present

## 2021-10-14 DIAGNOSIS — I4891 Unspecified atrial fibrillation: Secondary | ICD-10-CM | POA: Diagnosis not present

## 2021-10-14 DIAGNOSIS — I5030 Unspecified diastolic (congestive) heart failure: Secondary | ICD-10-CM

## 2021-10-14 DIAGNOSIS — R29703 NIHSS score 3: Secondary | ICD-10-CM | POA: Diagnosis present

## 2021-10-14 DIAGNOSIS — I639 Cerebral infarction, unspecified: Secondary | ICD-10-CM

## 2021-10-14 DIAGNOSIS — N179 Acute kidney failure, unspecified: Secondary | ICD-10-CM | POA: Diagnosis not present

## 2021-10-14 DIAGNOSIS — E785 Hyperlipidemia, unspecified: Secondary | ICD-10-CM | POA: Diagnosis not present

## 2021-10-14 DIAGNOSIS — K5900 Constipation, unspecified: Secondary | ICD-10-CM | POA: Diagnosis not present

## 2021-10-14 DIAGNOSIS — I5032 Chronic diastolic (congestive) heart failure: Secondary | ICD-10-CM | POA: Diagnosis not present

## 2021-10-14 DIAGNOSIS — I428 Other cardiomyopathies: Secondary | ICD-10-CM | POA: Diagnosis present

## 2021-10-14 DIAGNOSIS — Z885 Allergy status to narcotic agent status: Secondary | ICD-10-CM | POA: Diagnosis not present

## 2021-10-14 DIAGNOSIS — I6381 Other cerebral infarction due to occlusion or stenosis of small artery: Secondary | ICD-10-CM | POA: Diagnosis not present

## 2021-10-14 DIAGNOSIS — I480 Paroxysmal atrial fibrillation: Secondary | ICD-10-CM | POA: Diagnosis not present

## 2021-10-14 DIAGNOSIS — E1169 Type 2 diabetes mellitus with other specified complication: Secondary | ICD-10-CM | POA: Diagnosis not present

## 2021-10-14 DIAGNOSIS — Z7951 Long term (current) use of inhaled steroids: Secondary | ICD-10-CM | POA: Diagnosis not present

## 2021-10-14 DIAGNOSIS — Z833 Family history of diabetes mellitus: Secondary | ICD-10-CM | POA: Diagnosis not present

## 2021-10-14 DIAGNOSIS — G8191 Hemiplegia, unspecified affecting right dominant side: Secondary | ICD-10-CM | POA: Diagnosis present

## 2021-10-14 DIAGNOSIS — R29818 Other symptoms and signs involving the nervous system: Secondary | ICD-10-CM | POA: Diagnosis not present

## 2021-10-14 DIAGNOSIS — D509 Iron deficiency anemia, unspecified: Secondary | ICD-10-CM | POA: Diagnosis not present

## 2021-10-14 DIAGNOSIS — I10 Essential (primary) hypertension: Secondary | ICD-10-CM | POA: Diagnosis not present

## 2021-10-14 DIAGNOSIS — I63512 Cerebral infarction due to unspecified occlusion or stenosis of left middle cerebral artery: Secondary | ICD-10-CM | POA: Diagnosis not present

## 2021-10-14 DIAGNOSIS — Z7901 Long term (current) use of anticoagulants: Secondary | ICD-10-CM | POA: Diagnosis not present

## 2021-10-14 DIAGNOSIS — K219 Gastro-esophageal reflux disease without esophagitis: Secondary | ICD-10-CM | POA: Diagnosis not present

## 2021-10-14 DIAGNOSIS — E119 Type 2 diabetes mellitus without complications: Secondary | ICD-10-CM | POA: Diagnosis not present

## 2021-10-14 DIAGNOSIS — I2510 Atherosclerotic heart disease of native coronary artery without angina pectoris: Secondary | ICD-10-CM | POA: Diagnosis present

## 2021-10-14 DIAGNOSIS — I69351 Hemiplegia and hemiparesis following cerebral infarction affecting right dominant side: Secondary | ICD-10-CM | POA: Diagnosis not present

## 2021-10-14 DIAGNOSIS — I110 Hypertensive heart disease with heart failure: Secondary | ICD-10-CM | POA: Diagnosis not present

## 2021-10-14 DIAGNOSIS — E669 Obesity, unspecified: Secondary | ICD-10-CM | POA: Diagnosis not present

## 2021-10-14 DIAGNOSIS — Z79899 Other long term (current) drug therapy: Secondary | ICD-10-CM | POA: Diagnosis not present

## 2021-10-14 DIAGNOSIS — Z66 Do not resuscitate: Secondary | ICD-10-CM | POA: Diagnosis not present

## 2021-10-14 DIAGNOSIS — Z87891 Personal history of nicotine dependence: Secondary | ICD-10-CM | POA: Diagnosis not present

## 2021-10-14 DIAGNOSIS — J449 Chronic obstructive pulmonary disease, unspecified: Secondary | ICD-10-CM | POA: Diagnosis not present

## 2021-10-14 LAB — ECHOCARDIOGRAM COMPLETE
AR max vel: 2.06 cm2
AV Area VTI: 1.8 cm2
AV Area mean vel: 1.99 cm2
AV Mean grad: 3 mmHg
AV Peak grad: 6.7 mmHg
Ao pk vel: 1.29 m/s
Area-P 1/2: 2.55 cm2
Height: 63 in
MV VTI: 1.38 cm2
S' Lateral: 2.7 cm
Weight: 2640 oz

## 2021-10-14 LAB — COMPREHENSIVE METABOLIC PANEL
ALT: 14 U/L (ref 0–44)
AST: 17 U/L (ref 15–41)
Albumin: 3.7 g/dL (ref 3.5–5.0)
Alkaline Phosphatase: 52 U/L (ref 38–126)
Anion gap: 10 (ref 5–15)
BUN: 31 mg/dL — ABNORMAL HIGH (ref 8–23)
CO2: 26 mmol/L (ref 22–32)
Calcium: 9.8 mg/dL (ref 8.9–10.3)
Chloride: 99 mmol/L (ref 98–111)
Creatinine, Ser: 1.12 mg/dL — ABNORMAL HIGH (ref 0.44–1.00)
GFR, Estimated: 49 mL/min — ABNORMAL LOW (ref >60)
Glucose, Bld: 142 mg/dL — ABNORMAL HIGH (ref 70–99)
Potassium: 4.2 mmol/L (ref 3.5–5.1)
Sodium: 135 mmol/L (ref 135–145)
Total Bilirubin: 0.6 mg/dL (ref 0.3–1.2)
Total Protein: 7.7 g/dL (ref 6.5–8.1)

## 2021-10-14 LAB — CBC
HCT: 34.4 % — ABNORMAL LOW (ref 36.0–46.0)
Hemoglobin: 10.6 g/dL — ABNORMAL LOW (ref 12.0–15.0)
MCH: 26.2 pg (ref 26.0–34.0)
MCHC: 30.8 g/dL (ref 30.0–36.0)
MCV: 84.9 fL (ref 80.0–100.0)
Platelets: 243 10*3/uL (ref 150–400)
RBC: 4.05 MIL/uL (ref 3.87–5.11)
RDW: 15.3 % (ref 11.5–15.5)
WBC: 10.3 10*3/uL (ref 4.0–10.5)
nRBC: 0 % (ref 0.0–0.2)

## 2021-10-14 LAB — URINALYSIS, ROUTINE W REFLEX MICROSCOPIC
Bilirubin Urine: NEGATIVE
Glucose, UA: NEGATIVE mg/dL
Ketones, ur: NEGATIVE mg/dL
Nitrite: POSITIVE — AB
Protein, ur: 30 mg/dL — AB
Specific Gravity, Urine: 1.012 (ref 1.005–1.030)
pH: 6 (ref 5.0–8.0)

## 2021-10-14 LAB — HEMOGLOBIN A1C
Hgb A1c MFr Bld: 6.5 % — ABNORMAL HIGH (ref 4.8–5.6)
Mean Plasma Glucose: 139.85 mg/dL

## 2021-10-14 LAB — LIPID PANEL
Cholesterol: 231 mg/dL — ABNORMAL HIGH (ref 0–200)
HDL: 51 mg/dL (ref >40)
LDL Cholesterol: 152 mg/dL — ABNORMAL HIGH (ref 0–99)
Total CHOL/HDL Ratio: 4.5 RATIO
Triglycerides: 142 mg/dL (ref <150)
VLDL: 28 mg/dL (ref 0–40)

## 2021-10-14 LAB — RAPID URINE DRUG SCREEN, HOSP PERFORMED
Amphetamines: NOT DETECTED
Barbiturates: NOT DETECTED
Benzodiazepines: NOT DETECTED
Cocaine: NOT DETECTED
Opiates: NOT DETECTED
Tetrahydrocannabinol: NOT DETECTED

## 2021-10-14 LAB — GLUCOSE, CAPILLARY: Glucose-Capillary: 120 mg/dL — ABNORMAL HIGH (ref 70–99)

## 2021-10-14 LAB — CBG MONITORING, ED
Glucose-Capillary: 149 mg/dL — ABNORMAL HIGH (ref 70–99)
Glucose-Capillary: 140 mg/dL — ABNORMAL HIGH (ref 70–99)

## 2021-10-14 MED ORDER — MELATONIN 3 MG PO TABS
6.0000 mg | ORAL_TABLET | Freq: Every day | ORAL | Status: DC
  Administered 2021-10-14 – 2021-10-16 (×4): 6 mg via ORAL
  Filled 2021-10-14 (×4): qty 2

## 2021-10-14 MED ORDER — HYDRALAZINE HCL 20 MG/ML IJ SOLN: 5.0000 mg | Freq: Four times a day (QID) | INTRAMUSCULAR | Status: DC | PRN

## 2021-10-14 MED ORDER — ACETAMINOPHEN 650 MG RE SUPP: 650.0000 mg | RECTAL | Status: DC | PRN

## 2021-10-14 MED ORDER — APIXABAN 5 MG PO TABS
5.0000 mg | ORAL_TABLET | Freq: Two times a day (BID) | ORAL | Status: DC
Start: 2021-10-14 — End: 2021-10-17
  Administered 2021-10-14 – 2021-10-17 (×7): 5 mg via ORAL
  Filled 2021-10-14 (×7): qty 1

## 2021-10-14 MED ORDER — SODIUM CHLORIDE 0.9 % IV SOLN: INTRAVENOUS | Status: AC

## 2021-10-14 MED ORDER — PROCHLORPERAZINE EDISYLATE 10 MG/2ML IJ SOLN
10.0000 mg | Freq: Four times a day (QID) | INTRAMUSCULAR | Status: DC | PRN
  Filled 2021-10-14: qty 2

## 2021-10-14 MED ORDER — ACETAMINOPHEN 160 MG/5ML PO SOLN: 650.0000 mg | ORAL | Status: DC | PRN

## 2021-10-14 MED ORDER — ATORVASTATIN CALCIUM 40 MG PO TABS
40.0000 mg | ORAL_TABLET | Freq: Every day | ORAL | Status: DC
  Administered 2021-10-14 – 2021-10-16 (×3): 40 mg via ORAL
  Filled 2021-10-14 (×3): qty 1

## 2021-10-14 MED ORDER — ACETAMINOPHEN 325 MG PO TABS
650.0000 mg | ORAL_TABLET | ORAL | Status: DC | PRN
  Administered 2021-10-14: 650 mg via ORAL
  Filled 2021-10-14: qty 2

## 2021-10-14 MED ORDER — CARVEDILOL 3.125 MG PO TABS
6.2500 mg | ORAL_TABLET | Freq: Two times a day (BID) | ORAL | Status: DC
  Administered 2021-10-14 – 2021-10-17 (×7): 6.25 mg via ORAL
  Filled 2021-10-14 (×7): qty 2

## 2021-10-14 MED ORDER — LOSARTAN POTASSIUM 50 MG PO TABS: 50.0000 mg | ORAL_TABLET | Freq: Two times a day (BID) | ORAL | Status: DC

## 2021-10-14 MED ORDER — LOSARTAN POTASSIUM 25 MG PO TABS
50.0000 mg | ORAL_TABLET | Freq: Two times a day (BID) | ORAL | Status: DC
  Filled 2021-10-14: qty 2

## 2021-10-14 MED ORDER — INSULIN ASPART 100 UNIT/ML IJ SOLN: 0.0000 [IU] | Freq: Every day | INTRAMUSCULAR | Status: DC

## 2021-10-14 MED ORDER — LORAZEPAM 2 MG/ML IJ SOLN
0.5000 mg | Freq: Once | INTRAMUSCULAR | Status: AC
  Administered 2021-10-14: 0.5 mg via INTRAVENOUS
  Filled 2021-10-14: qty 1

## 2021-10-14 MED ORDER — LOSARTAN POTASSIUM 50 MG PO TABS
50.0000 mg | ORAL_TABLET | Freq: Two times a day (BID) | ORAL | Status: DC
  Administered 2021-10-14 – 2021-10-17 (×6): 50 mg via ORAL
  Filled 2021-10-14 (×6): qty 1

## 2021-10-14 MED ORDER — SPIRONOLACTONE 25 MG PO TABS
25.0000 mg | ORAL_TABLET | Freq: Every day | ORAL | Status: DC
  Administered 2021-10-14 – 2021-10-17 (×4): 25 mg via ORAL
  Filled 2021-10-14 (×4): qty 1

## 2021-10-14 MED ORDER — PROCHLORPERAZINE EDISYLATE 10 MG/2ML IJ SOLN: 5.0000 mg | Freq: Four times a day (QID) | INTRAMUSCULAR | Status: DC | PRN

## 2021-10-14 MED ORDER — PANTOPRAZOLE SODIUM 40 MG PO TBEC
40.0000 mg | DELAYED_RELEASE_TABLET | Freq: Every day | ORAL | Status: DC
  Administered 2021-10-14 – 2021-10-17 (×4): 40 mg via ORAL
  Filled 2021-10-14 (×4): qty 1

## 2021-10-14 MED ORDER — APIXABAN 5 MG PO TABS
5.0000 mg | ORAL_TABLET | Freq: Two times a day (BID) | ORAL | Status: DC
  Administered 2021-10-14: 5 mg via ORAL
  Filled 2021-10-14: qty 1

## 2021-10-14 MED ORDER — GADOBUTROL 1 MMOL/ML IV SOLN
7.0000 mL | Freq: Once | INTRAVENOUS | Status: AC | PRN
  Administered 2021-10-14: 7 mL via INTRAVENOUS

## 2021-10-14 MED ORDER — STROKE: EARLY STAGES OF RECOVERY BOOK
Freq: Once | Status: DC
  Filled 2021-10-14: qty 1

## 2021-10-14 MED ORDER — SODIUM CHLORIDE 0.9 % IV SOLN: INTRAVENOUS | Status: DC

## 2021-10-14 MED ORDER — INSULIN ASPART 100 UNIT/ML IJ SOLN
0.0000 [IU] | Freq: Three times a day (TID) | INTRAMUSCULAR | Status: DC
  Administered 2021-10-14 – 2021-10-15 (×4): 2 [IU] via SUBCUTANEOUS
  Administered 2021-10-15: 3 [IU] via SUBCUTANEOUS
  Administered 2021-10-16: 2 [IU] via SUBCUTANEOUS
  Administered 2021-10-16: 5 [IU] via SUBCUTANEOUS
  Administered 2021-10-17: 2 [IU] via SUBCUTANEOUS
  Filled 2021-10-14 (×2): qty 1

## 2021-10-14 NOTE — Consult Note (Signed)
[facility] ?[facility] Tele[facility]urology Consult Services ? ?Stat Consult ? ?Patient Name:   Leslie Kim, Leslie Kim ?Date of Birth:   [birth year] ?Identification Number:   MRN - [id number] ?Date of Service:   10/13/2021 20:49:58 ? ?Diagnosis: ?      R53.1 - Weakness ? ?Impression ?36F with vascular risk factors presents OOW for right hemibody symptoms with ataxia on the right side. Imaging negative, recommend admission for stroke work up. Okay to continue eliquis. ? ?Our recommendations are outlined below. ? ?Diagnostic Studies: ?MRI Head without contrast ?Please order ? ?Laboratory Studies: ?Recommend Lipid panel ?Hemoglobin A1c ? ?Antithrombotic Medications: ?Hold antithrombotics for now. ?Please order the hold ? ?Additional Antithrombotic Medications: ?continue eliquis ? ?Nursing Recommendations: ?Telemetry, IV Fluids, avoid dextrose containing fluids, Maintain euglycemia ?Neuro checks q4 hrs x 24 hrs and then per shift ?Head of bed 30 degrees ? ?Consultations: ?Recommend Speech therapy if failed dysphagia screen ?Physical therapy/Occupational therapy ? ?DVT Prophylaxis: ?Choice of Primary Team ? ?Disposition: ?Neurology will follow ? ? ? ?---------------------------------------------------------------------------------------------------- ? ?Metrics: ?[facility] Notification Time: 10/13/2021 20:48:23 ?Stamp Time: 10/13/2021 20:49:58 ?Callback Response Time: 10/13/2021 20:52:10 ? ? ? ? ?---------------------------------------------------------------------------------------------------- ? ?Chief Complaint: ?rsw ? ?History of Present Illness: ?Patient is a 86 year old Female. ?This is a 87 year old female patient with HTN, HLD, DM, and remote tobacco use that is consulted for right sided weakness. Patient reports having issues with h er right side since Tuesday evening and that the today she really noticed having trouble with her right side and even typing on the computer, hitting the wrong buttons. She felt weak and  woozy and still feels weak today. ?  ? ?Past Medical History: ?     Hypertension ?     Diabetes Mellitus ?     Hyperlipidemia ?     Coronary Artery Disease ?     There is no history of Stroke ? ?Medications: ? ?Anticoagulant use:  Yes Eliquis 5 bid ?No Antiplatelet use ?Reviewed EMR for current medications ? ?Allergies:  ?Reviewed ? ?Social History: ?Smoking: Former ? ?Family History: ? ?There is no family history of premature cerebrovascular disease pertinent to this consultation ? ?ROS : ?14 Points Review of Systems was performed and was negative except mentioned in HPI. ? ?Past Surgical History: ?There Is No Surgical History Contributory To Today?s Visit ? ?  ?Examination: ?BP(157/75), Pulse(80), ?1A: Level of Consciousness - Alert; keenly responsive + 0 ?1B: Ask Month and Age - Both Questions Right + 0 ?1C: Blink Eyes & Squeeze Hands - Performs Both Tasks + 0 ?2: Test Horizontal Extraocular Movements - Normal + 0 ?3: Test Visual Fields - No Visual Loss + 0 ?4: Test Facial Palsy (Use Grimace if Obtunded) - Normal symmetry + 0 ?5A: Test Left Arm Motor Drift - No Drift for 10 Seconds + 0 ?5B: Test Right Arm Motor Drift - Drift, but doesn't hit bed + 1 ?6A: Test Left Leg Motor Drift - No Drift for 5 Seconds + 0 ?6B: Test Right Leg Motor Drift - Drift, but doesn't hit bed + 1 ?7: Test Limb Ataxia (FNF/Heel-Shin) - Ataxia in 1 Limb + 1 ?8: Test Sensation - Normal; No sensory loss + 0 ?9: Test Language/Aphasia - Normal; No aphasia + 0 ?10: Test Dysarthria - Normal + 0 ?11: Test Extinction/Inattention - No abnormality + 0 ? ?NIHSS Score: 3 ? ? ? ? ?Patient / Family was informed the Neurology Consult would occur via TeleHealth consult by way of interactive audio and video telecommunications and  consented to receiving care in this manner. ? ?Patient is being evaluated for possible acute neurologic impairment and high probability of imminent or life - threatening deterioration.I spent total of 35 minutes providing care to  this patient, including time for face to face visit via telemedicine, review of medical records, imaging studies and discussion of findings with providers, the patient and / or family. ? ? ?Dr Luetta Nutting ? ? ?[facility] ?[phone] ? ?Case [id number] ?

## 2021-10-14 NOTE — Assessment & Plan Note (Addendum)
-   Patient maintained on Coreg for rate control.   ?-Eliquis for anticoagulation.   ?

## 2021-10-14 NOTE — Progress Notes (Signed)
I have seen and assessed patient and agree with Dr.Zierle-Ghosh's assessment and plan.  Patient is a 86 year old female history of CHF, COPD, hypertension, atrial fibrillation, type 2 diabetes presented to the ED with right upper extremity and right lower extremity weakness.  Patient also noted some numbness in the right hand and right leg and could not use dexterity of the right hand in order to order things from the store on her phone.  Patient noted to be right-hand dominant.  Patient seen in the ED CT head done with no acute abnormalities.  MRI brain done with acute small vessel infarct of the left corona radiator, mild to moderate chronic microvascular ischemic changes noted.  MRA head and neck done with plaque at the right greater than left ICA origins with less than 50% stenosis.  2D echo pending.  Neurology consulted and patient seen in consultation by neurology who recommended continuation of home regimen apixaban.  Patient noted to be out of the window for permissive hypertension and noted to normalize BP gradually over the next 4 to 5 days.  We will start patient on atorvastatin 40 mg daily with goal LDL < 70.  PT/OT has assessed the patient and recommending CIR.  SLP has assessed the patient.  Placed on gentle hydration.  Clear liquid diet and advance as tolerated to heart healthy diet.  Neurology following and appreciate input and recommendations. ? ?No charge. ?

## 2021-10-14 NOTE — ED Notes (Signed)
Pt transported to MRI. Denies need for anxiety medication. ?

## 2021-10-14 NOTE — H&P (Signed)
?History and Physical  ? ? ?Patient: Leslie Kim SMO:[id number] DOB: 1936-01-31 ?DOA: 10/13/2021 ?DOS: the patient was seen and examined on 10/14/2021 ?PCP: Benita Stabile, MD  ?Patient coming from: Home ? ?Chief Complaint:  ?Chief Complaint  ?Patient presents with  ? Right Sided Weakness  ? ?HPI: Leslie Kim is a 86 y.o. female with medical history significant of CHF, COPD, hypertension, atrial fibrillation, diabetes mellitus type 2, presents the ER with a chief complaint of right arm and leg weakness.  Patient reports that Tuesday night she noticed numbness in her right hand and right leg.  She noticed it when she could not use the dexterity of her right hand to order things from the store on her phone.  Patient is right-hand dominant.  Patient reports no falls, no trouble with vision, speaking, drooling, hearing at that time.  She reports no numbness in her arm or leg.  Her legs do jump sometimes.  She reports the weakness in her legs is worse when she is trying to walk.  Patient has no other complaints at this time. ?Review of Systems: As mentioned in the history of present illness. All other systems reviewed and are negative. ?Past Medical History:  ?Diagnosis Date  ? Allergy   ? Cataract   ? CHF (congestive heart failure) (HCC)   ? COPD (chronic obstructive pulmonary disease) (HCC)   ? Essential hypertension   ? New onset atrial fibrillation (HCC) 11/2015  ? a. s/p DCCV on 12/18/2015 b. started on Eliquis  ? Nonischemic cardiomyopathy (HCC)   ? a. 11/2015: echo showing EF of 30-35%, cath with nonobstructive dz  ? Type 2 diabetes mellitus (HCC)   ? ?Past Surgical History:  ?Procedure Laterality Date  ? CARDIAC CATHETERIZATION N/A 12/23/2015  ? Procedure: Right/Left Heart Cath and Coronary Angiography;  Surgeon: Laurey Morale, MD;  Location: [facility] INVASIVE CV LAB;  Service: Cardiovascular;  Laterality: N/A;  ? cataract surgery    ? CHOLECYSTECTOMY    ? ?Social History:  reports that she quit smoking about 13 years  ago. Her smoking use included cigarettes. She has a 40.00 pack-year smoking history. She has never used smokeless tobacco. She reports that she does not drink alcohol and does not use drugs. ? ?Allergies  ?Allergen Reactions  ? Codeine Other (See Comments)  ?  Too strong for patient    ? ? ?Family History  ?Problem Relation Age of Onset  ? Diabetes Mother   ? Diabetes Father   ? Cancer Father   ? Prostate cancer Father   ? Diabetes Brother   ? Diabetes Brother   ? Diabetes Brother   ? Diabetes Daughter   ? Diabetes Son   ? ? ?Prior to Admission medications   ?Medication Sig Start Date End Date Taking? Authorizing Provider  ?acetaminophen (TYLENOL) 500 MG tablet Take 1,000 mg by mouth every 6 (six) hours as needed. Pain   Yes [provider]  ?apixaban (ELIQUIS) 5 MG TABS tablet TAKE (1) TABLET BY MOUTH TWICE DAILY. 12/23/20  Yes Jonelle Sidle, MD  ?b complex vitamins tablet Take 1 tablet by mouth every other day.   Yes [provider]  ?carvedilol (COREG) 6.25 MG tablet TAKE 1 TABLET BY MOUTH TWICE DAILY WITH MEAL ?Patient taking differently: Take 6.25 mg by mouth 2 (two) times daily with a meal. 04/12/21  Yes Jonelle Sidle, MD  ?cholecalciferol (VITAMIN D3) 25 MCG (1000 UNIT) tablet Take 2,000 Units by mouth daily.  Yes [provider]  ?diphenhydrAMINE (BENADRYL) 25 MG tablet Take 25 mg by mouth every 6 (six) hours as needed for allergies.   Yes [provider]  ?esomeprazole (NEXIUM) 20 MG capsule Take 20 mg by mouth daily.    Yes [provider]  ?ferrous sulfate 324 MG TBEC Take 1 tablet (324 mg total) by mouth daily with breakfast. 12/04/19  Yes Corum, Rex Kras, MD  ?furosemide (LASIX) 40 MG tablet TAKE 1 TABLET(40 MG) BY MOUTH DAILY 12/23/20  Yes Satira Sark, MD  ?losartan (COZAAR) 50 MG tablet TAKE 1 TABLET(50 MG) BY MOUTH TWICE DAILY ?Patient taking differently: Take 50 mg by mouth 2 (two) times daily. 12/23/20  Yes Satira Sark, MD  ?Melatonin 3  MG TABS Take 2 tablets (6 mg total) by mouth at bedtime. ?Patient taking differently: Take 5 mg by mouth at bedtime. 08/14/19  Yes Roxan Hockey, MD  ?PROAIR HFA 108 (90 Base) MCG/ACT inhaler Inhale 2 puffs into the lungs 4 (four) times daily as needed for wheezing or shortness of breath.  09/07/17  Yes [provider]  ?sitaGLIPtin-metformin (JANUMET) 50-1000 MG tablet Take 1 tablet by mouth 2 (two) times daily. 10/21/19  Yes Corum, Rex Kras, MD  ?spironolactone (ALDACTONE) 25 MG tablet TAKE 1/2 TABLET (12.5MG ) BY MOUTH ONCE DAILY. 12/23/20  Yes Satira Sark, MD  ?Donnal Debar 100-62.5-25 MCG/INH AEPB INHALE 1 PUFF INTO THE LUNGS ONCE DAILY. ?Patient taking differently: Inhale 1 puff into the lungs every evening. 02/19/20  Yes Satira Sark, MD  ?Multiple Vitamin (MULTIVITAMIN WITH MINERALS) TABS tablet Take 1 tablet by mouth daily. ?Patient not taking: Reported on 10/13/2021 08/15/19   Roxan Hockey, MD  ? ? ?Physical Exam: ?Vitals:  ? 10/13/21 2300 10/14/21 0100 10/14/21 0230 10/14/21 0330  ?BP: (!) 148/72 (!) 166/60 (!) 165/68 (!) 173/71  ?Pulse: 75 74 82 82  ?Resp: (!) 22 (!) 25 (!) 28 20  ?Temp:      ?TempSrc:      ?SpO2: 98% 97% 97% 98%  ?Weight:      ?Height:      ? ?1.  General: ?Patient lying supine in bed,  no acute distress ?  ?2. Psychiatric: ?Alert and oriented x 3, mood and behavior normal for situation, pleasant and cooperative with exam ?  ?3. Neurologic: ?Speech and language are normal, face is symmetric, moves all 4 extremities voluntarily, right arm is 3 out of 5 strength, right leg is 4 out of 5 strength.  Equal sensation in the upper and lower extremities bilaterally ?  ?4. HEENMT:  ?Head is atraumatic, normocephalic, pupils reactive to light, neck is supple, trachea is midline, mucous membranes are moist ?  ?5. Respiratory : ?Lungs are clear to auscultation bilaterally without wheezing, rhonchi, rales, no cyanosis, no increase in work of breathing or accessory muscle use ?   ?6. Cardiovascular : ?Heart rate normal, rhythm is regular, no murmurs, rubs or gallops, no peripheral edema, peripheral pulses palpated ?  ?7. Gastrointestinal:  ?Abdomen is soft, nondistended, nontender to palpation bowel sounds active, no masses or organomegaly palpated ?  ?8. Skin:  ?Skin is warm, dry and intact without rashes, acute lesions, or ulcers on limited exam ?  ?9.Musculoskeletal:  ?No acute deformities or trauma, no asymmetry in tone, no peripheral edema, peripheral pulses palpated, no tenderness to palpation in the extremities ? ?Data Reviewed: ?In the ED ?Temp 97.9, heart rate 72-80, respiratory rate 18-33, blood pressure 148/72, satting 96% ?No leukocytosis with  a white blood cell count of 8.7, hemoglobin 10.8 ?Chemistry is unremarkable ?Respiratory panel negative ?Alcohol level undetectable ?CT head shows no acute intracranial pathology, mild chronic microvascular ischemic changes ?EKG shows a heart rate of 73, sinus rhythm, QTc 481 ?UA and UDS pending ?Admission requested for further work-up of possible stroke ? ?Assessment and Plan: ?* CVA (cerebral vascular accident) ([place]) ?Right arm and leg weakness ?No stroke on CT head ?MRI and echo in the a.m. ?PT/OT/ST eval and treat ?Continue to monitor ? ?Gastroesophageal reflux disease without esophagitis ?Continue PPI ? ?Atrial fibrillation ([place]) ?Continue Eliquis and Coreg ? ?Diabetes mellitus ([place]) ?Sliding scale coverage ?Carb modified diet ?Hold Janumet ? ?HTN (hypertension) ?Continue losartan ? ? ? ? ? Advance Care Planning:   Code Status: Full Code  ? ?Consults: Inpatient neurology ? ?Family Communication: No family at bedside ? ?Severity of Illness: ?The appropriate patient status for this patient is OBSERVATION. Observation status is judged to be reasonable and necessary in order to provide the required intensity of service to ensure the patient's safety. The patient's presenting symptoms, physical exam findings, and initial radiographic and  laboratory data in the context of their medical condition is felt to place them at decreased risk for further clinical deterioration. Furthermore, it is anticipated that the patient will be medically st

## 2021-10-14 NOTE — Assessment & Plan Note (Addendum)
-  Patient presented with right arm and leg weakness. ?-CT head done on admission negative. ?-MRI brain done with acute small vessel infarct of the left corona radiator.  Mild to moderate chronic microvascular ischemic changes. ?-MRA head and neck done with plaque on the right > left ICA origins with less than 50% stenosis.  No LVO. ?-Patient noted to be out of the window for tPA and also out of window for permissive hypertension. ?-2D echo with normal EF, no source of emboli noted, no PFO. ?-Hemoglobin A1c noted at 6.5. ?-Fasting lipid panel of 152.  Goal LDL < 70. ?-Patient started on Lipitor 40 mg daily. ?-Patient maintained on home regimen Eliquis. ?-Patient seen in consultation by neurology. ?-PT/OT assessed patient and recommended CIR. ?-Patient will be discharged to CIR. ?-Patient will need outpatient follow-up with neurology 3 to 4 weeks postdischarge. ?

## 2021-10-14 NOTE — Assessment & Plan Note (Addendum)
-  Patient maintained on PPI.       

## 2021-10-14 NOTE — Progress Notes (Signed)
Inpatient Rehab Admissions Coordinator:  ? ?I spoke with Pt.'s son Catalina Antigua, over the phone to discuss potential CIR admit. He states that he thinks that family would want CIR for Pt. But needs to confirm with Pt. And family. He agreed to let me open a case with insurance, so I will send case today. I will follow up over the weekend to confirm that pt. And family want to come to CIR.  ? ?Clemens Catholic, MS, CCC-SLP ?Rehab Admissions Coordinator  ?[phone] (celll) ?[phone] (office) ? ?

## 2021-10-14 NOTE — Progress Notes (Signed)
SLP Cancellation Note ? ?Patient Details ?Name: Leslie Kim ?MRN: [id number] ?DOB: [birth year] ? ? ?Cancelled treatment:       Reason Eval/Treat Not Completed: SLP screened, no needs identified, will sign off. Pt is functioning at baseline for speech, language and cognition. Thank you for this referral, ? ?[name] H. Myer Haff MA, CCC-SLP ?Speech Language Pathologist ? ? ? ?[name] ?10/14/2021, 9:59 AM ?

## 2021-10-14 NOTE — Consult Note (Signed)
Neurology Tele Consultation ?Reason for Consult: Stroke ?Requesting Physician: Eugenie Filler, MD  ? ?CC: Right sided weakness ? ?History is obtained from: Patient, son at bedside and chart review  ? ?HPI: Leslie Kim is a 86 y.o. right handed female with a past medical history significant for atrial fibrillation on Eliquis, hypertension, hyperlipidemia (trying diet control), type 2 diabetes (A1c 6.5%), nonischemic cardiomyopathy with congestive heart failure, COPD, former smoking (quit at age 13, 1 ppd x ~45 yrs),  ? ?Husband passed away last week, many people have been visiting, not much rest but no missed medications including taking apixaban and BP meds.  ? ?Tuesday just before bed had some trouble using her right hand, worse Wednesday morning, relatively stable since then. No improvement Thursday so came to ED for evaluation.  ? ?Double vision during our conversation, brief on initial awakening, no other focal complaints  ? ?Took pravastatin within the past year, took it for 1 week, felt generally bad.  ? ?BPPV history, has had some episodes of emesis here which is not atypical for her and she does not feel ill in any other way.  ? ? ?LKW: Tuesday 3/14 evening ?tPA given?: No, out of the window ?IA performed?: No, exam not c/w LVO on teleneuro eval (NIH 3 for right arm and leg drift and right ataxia) ?Premorbid modified rankin scale:  ?    0 - No symptoms. ? ? ?ROS: All other review of systems was negative except as noted in the HPI.  ? ?Past Medical History:  ?Diagnosis Date  ? Allergy   ? Cataract   ? CHF (congestive heart failure) ([place])   ? COPD (chronic obstructive pulmonary disease) ([place])   ? Essential hypertension   ? New onset atrial fibrillation ([place]) 11/2015  ? a. s/p DCCV on 12/18/2015 b. started on Eliquis  ? Nonischemic cardiomyopathy ([place])   ? a. 11/2015: echo showing EF of 30-35%, cath with nonobstructive dz  ? Type 2 diabetes mellitus ([place])   ? ?Past Surgical History:  ?Procedure Laterality  Date  ? CARDIAC CATHETERIZATION N/A 12/23/2015  ? Procedure: Right/Left Heart Cath and Coronary Angiography;  Surgeon: Larey Dresser, MD;  Location: [facility];  Service: Cardiovascular;  Laterality: N/A;  ? cataract surgery    ? CHOLECYSTECTOMY    ? ?Current Outpatient Medications  ?Medication Instructions  ? acetaminophen (TYLENOL) 1,000 mg, Oral, Every 6 hours PRN, Pain   ? apixaban (ELIQUIS) 5 MG TABS tablet TAKE (1) TABLET BY MOUTH TWICE DAILY.  ? b complex vitamins tablet 1 tablet, Oral, Every other day  ? carvedilol (COREG) 6.25 MG tablet TAKE 1 TABLET BY MOUTH TWICE DAILY WITH MEAL  ? cholecalciferol (VITAMIN D3) 2,000 Units, Oral, Daily  ? diphenhydrAMINE (BENADRYL) 25 mg, Oral, Every 6 hours PRN  ? esomeprazole (NEXIUM) 20 mg, Oral, Daily  ? ferrous sulfate 324 mg, Oral, Daily with breakfast  ? furosemide (LASIX) 40 MG tablet TAKE 1 TABLET(40 MG) BY MOUTH DAILY  ? losartan (COZAAR) 50 MG tablet TAKE 1 TABLET(50 MG) BY MOUTH TWICE DAILY  ? melatonin 6 mg, Oral, Daily at bedtime  ? Multiple Vitamin (MULTIVITAMIN WITH MINERALS) TABS tablet 1 tablet, Oral, Daily  ? PROAIR HFA 108 (90 Base) MCG/ACT inhaler 2 puffs, Inhalation, 4 times daily PRN  ? sitaGLIPtin-metformin (JANUMET) 50-1000 MG tablet 1 tablet, Oral, 2 times daily  ? spironolactone (ALDACTONE) 25 MG tablet TAKE 1/2 TABLET (12.5MG ) BY MOUTH ONCE DAILY.  ? TRELEGY ELLIPTA 100-62.5-25 MCG/INH  AEPB INHALE 1 PUFF INTO THE LUNGS ONCE DAILY.  ? ? ?Current Facility-Administered Medications:  ?   stroke: mapping our early stages of recovery book, , Does not apply, Once, Zierle-Ghosh, [place] B, DO ?  0.9 %  sodium chloride infusion, , Intravenous, Continuous, Eugenie Filler, MD, Last Rate: 100 mL/hr at 10/14/21 0904, New Bag at 10/14/21 0904 ?  acetaminophen (TYLENOL) tablet 650 mg, 650 mg, Oral, Q4H PRN **OR** acetaminophen (TYLENOL) 160 MG/5ML solution 650 mg, 650 mg, Per Tube, Q4H PRN **OR** acetaminophen (TYLENOL) suppository 650 mg, 650 mg,  Rectal, Q4H PRN, Zierle-Ghosh, [place] B, DO ?  atorvastatin (LIPITOR) tablet 40 mg, 40 mg, Oral, q1800, Eugenie Filler, MD ?  carvedilol (COREG) tablet 6.25 mg, 6.25 mg, Oral, BID WC, Zierle-Ghosh, Asia B, DO ?  insulin aspart (novoLOG) injection 0-15 Units, 0-15 Units, Subcutaneous, TID WC, Zierle-Ghosh, Asia B, DO, 2 Units at 10/14/21 0900 ?  insulin aspart (novoLOG) injection 0-5 Units, 0-5 Units, Subcutaneous, QHS, Zierle-Ghosh, Asia B, DO ?  [START ON 10/15/2021] losartan (COZAAR) tablet 50 mg, 50 mg, Oral, BID, Eugenie Filler, MD ?  melatonin tablet 6 mg, 6 mg, Oral, QHS, Zierle-Ghosh, Asia B, DO, 6 mg at 10/14/21 0122 ?  pantoprazole (PROTONIX) EC tablet 40 mg, 40 mg, Oral, Daily, Zierle-Ghosh, Asia B, DO ?  prochlorperazine (COMPAZINE) injection 10 mg, 10 mg, Intravenous, Q6H PRN, Eugenie Filler, MD ?  spironolactone (ALDACTONE) tablet 25 mg, 25 mg, Oral, Daily, Zierle-Ghosh, Asia B, DO ? ?Family History  ?Problem Relation Age of Onset  ? Diabetes Mother   ? Diabetes Father   ? Cancer Father   ? Prostate cancer Father   ? Diabetes Brother   ? Diabetes Brother   ? Diabetes Brother   ? Diabetes Daughter   ? Diabetes Son   ? ? ? ?Social History:  reports that she quit smoking about 13 years ago. Her smoking use included cigarettes. She has a 40.00 pack-year smoking history. She has never used smokeless tobacco. She reports that she does not drink alcohol and does not use drugs. ? ? ?Exam: ?Current vital signs: ?BP (!) 172/54   Pulse 80   Temp 97.9 ?F (36.6 ?C) (Oral)   Resp 20   Ht 5\' 3"  (1.6 m)   Wt 74.8 kg   SpO2 94%   BMI 29.23 kg/m?  ?Vital signs in last 24 hours: ?Temp:  [97.9 ?F (36.6 ?C)] 97.9 ?F (36.6 ?C) (03/16 1956) ?Pulse Rate:  [71-82] 80 (03/17 0730) ?Resp:  [18-33] 20 (03/17 0730) ?BP: (141-182)/(51-79) 172/54 (03/17 0730) ?SpO2:  [92 %-98 %] 94 % (03/17 0730) ?Weight:  [74.8 kg] 74.8 kg (03/16 1954) ? ? ?Physical Exam  ?Constitutional: Appears well-developed and well-nourished.   ?Psych: Affect appropriate to situation,  ?Eyes: No scleral injection ?HENT: No oropharyngeal obstruction.  ?MSK: no clear deformities.  ?Cardiovascular: Perfusing extremities well ?Respiratory: Effort normal, non-labored breathing ? ?Neuro: ?Mental Status: ?Patient is slightly sleepy but alerts easily, oriented to person, place, month, and situation. ?Patient is able to give a clear and coherent history. ?No signs of aphasia or neglect ?Cranial Nerves: ?II: Visual Fields are full. ?III,IV, VI: EOMI without ptosis or diploplia on formal testing, brief diplopia during conversation resolved ?V: Facial sensation is symmetric to light touch ?VII: Facial movement is baseline, subtle left facial droop which family reports is not new ?VIII: hearing is intact to voice ?XII: tongue is midline without atrophy or fasciculations.  ?Motor: ?Right upper extremity has  pronator drift, but does not hit the bed.  She has difficulty maintaining the right leg antigravity, but again it does not hit the bed.  No drift of the left side. ?Sensory: ?Sensation is symmetric to light touch in the arms and legs without extinction to double simultaneous stimuli ?Cerebellar: ?Mild discoordination of the right upper extremity which appears proportional to her weakness, as does discoordination of the right lower extremity ?Gait:  ?Deferred in video setting, please see PT evaluation ? ?NIHSS total 2 ?Score breakdown: Right upper and lower extremity drift ?Performed at 11 AM time of patient arrival to ED  ? ? ?I have reviewed labs in epic and the results pertinent to this consultation are: ? ?Basic Metabolic Panel: ?Recent Labs  ?Lab 10/13/21 ?2038 10/14/21 ?0342  ?NA 133* 135  ?K 3.9 4.2  ?CL 99 99  ?CO2 23 26  ?GLUCOSE 155* 142*  ?BUN 30* 31*  ?CREATININE 1.33* 1.12*  ?CALCIUM 9.6 9.8  ? ? ?CBC: ?Recent Labs  ?Lab 10/13/21 ?2038 10/14/21 ?0342  ?WBC 8.7 10.3  ?NEUTROABS 5.4  --   ?HGB 10.8* 10.6*  ?HCT 35.4* 34.4*  ?MCV 84.9 84.9  ?PLT 256 243   ? ? ?Coagulation Studies: ?Recent Labs  ?  10/13/21 ?2038  ?LABPROT 14.9  ?INR 1.2  ?  ?UA w/ leukocytes, nitrites, many bacteria ? ?Utox negative  ? ?I have personally reviewed the images obtained: ? ?Head CT -- n

## 2021-10-14 NOTE — ED Notes (Signed)
Pt states she is no longer feeling nauseated and would like to hold off on compazine right now. She was able to tolerate small sips of water without emesis. Will give oral meds as scheduled unless pt becomes nauseated again  ?

## 2021-10-14 NOTE — Plan of Care (Signed)
?  Problem: Acute Rehab OT Goals (only OT should resolve) ?Goal: Pt. Will Perform Eating ?Flowsheets (Taken 10/14/2021 1007) ?Pt Will Perform Eating: ? with modified independence ? sitting ?Goal: Pt. Will Perform Grooming ?Flowsheets (Taken 10/14/2021 1007) ?Pt Will Perform Grooming: ? with min assist ? standing ? with min guard assist ?Goal: Pt. Will Perform Lower Body Bathing ?Flowsheets (Taken 10/14/2021 1007) ?Pt Will Perform Lower Body Bathing: ? with min assist ? sitting/lateral leans ? with min guard assist ? with adaptive equipment ?Goal: Pt. Will Perform Upper Body Dressing ?Flowsheets (Taken 10/14/2021 1007) ?Pt Will Perform Upper Body Dressing: ? with modified independence ? sitting ?Goal: Pt. Will Perform Lower Body Dressing ?Flowsheets (Taken 10/14/2021 1007) ?Pt Will Perform Lower Body Dressing: ? with min guard assist ? with min assist ? sitting/lateral leans ?Goal: Pt. Will Transfer To Toilet ?Flowsheets (Taken 10/14/2021 1007) ?Pt Will Transfer to Toilet: ? with supervision ? stand pivot transfer ?Goal: Pt. Will Perform Toileting-Clothing Manipulation ?Flowsheets (Taken 10/14/2021 1007) ?Pt Will Perform Toileting - Clothing Manipulation and hygiene: ? with min guard assist ? sit to/from stand ? sitting/lateral leans ?Goal: Pt/Caregiver Will Perform Home Exercise Program ?Flowsheets (Taken 10/14/2021 1007) ?Pt/caregiver will Perform Home Exercise Program: ? Increased ROM ? Increased strength ? Right Upper extremity ? With Supervision ? With minimal assist ? Gennett Garcia OT, MOT ? ?

## 2021-10-14 NOTE — Evaluation (Signed)
Physical Therapy Evaluation ?Patient Details ?Name: Leslie Kim ?MRN: [id number] ?DOB: May 23, 1936 ?Today's Date: 10/14/2021 ? ?History of Present Illness ? Leslie Kim is a 86 y.o. female with medical history significant of CHF, COPD, hypertension, atrial fibrillation, diabetes mellitus type 2, presents the ER with a chief complaint of right arm and leg weakness.  Patient reports that Tuesday night she noticed numbness in her right hand and right leg.  She noticed it when she could not use the dexterity of her right hand to order things from the store on her phone.  Patient is right-hand dominant.  Patient reports no falls, no trouble with vision, speaking, drooling, hearing at that time.  She reports no numbness in her arm or leg.  Her legs do jump sometimes.  She reports the weakness in her legs is worse when she is trying to walk.  Patient has no other complaints at this time.  Review of Systems: As mentioned in the history of present illness. All other systems reviewed and are negative. (taken per MD note) ?  ?Clinical Impression ? Patient demonstrates slow labored movement for sitting up at bedside, very unsteady on feet with difficulty advancing RLE, unsafe when attempting ambulation without AD due to poor standing balance, had to use RW with difficulty holding onto RW using right hand due to weakness and limited for ambulation mostly due to c/o fatigue possibly due to a lack of sleep.  Patient demonstrates good return for using BLE, bridging, to help reposition self when put back to bed in ED gurney.  Patient will benefit from continued skilled physical therapy in hospital and recommended venue below to increase strength, balance, endurance for safe ADLs and gait.  ?   ?   ? ?Recommendations for follow up therapy are one component of a multi-disciplinary discharge planning process, led by the attending physician.  Recommendations may be updated based on patient status, additional functional criteria and  insurance authorization. ? ?Follow Up Recommendations Acute inpatient rehab (3hours/day) ? ?  ?Assistance Recommended at Discharge Set up Supervision/Assistance  ?Patient can return home with the following ? A lot of help with walking and/or transfers;A lot of help with bathing/dressing/bathroom;Assistance with cooking/housework;Assist for transportation;Help with stairs or ramp for entrance ? ?  ?Equipment Recommendations Rolling walker (2 wheels)  ?Recommendations for Other Services ?    ?  ?Functional Status Assessment Patient has had a recent decline in their functional status and demonstrates the ability to make significant improvements in function in a reasonable and predictable amount of time.  ? ?  ?Precautions / Restrictions Precautions ?Precautions: Fall ?Restrictions ?Weight Bearing Restrictions: No  ? ?  ? ?Mobility ? Bed Mobility ?Overal bed mobility: Needs Assistance ?Bed Mobility: Supine to Sit, Sit to Supine ?  ?  ?Supine to sit: Min assist, HOB elevated ?Sit to supine: Min assist, Mod assist ?  ?General bed mobility comments: as per OT notes ?  ? ?Transfers ?Overall transfer level: Needs assistance ?Equipment used: Rolling walker (2 wheels) ?Transfers: Sit to/from Stand, Bed to chair/wheelchair/BSC ?Sit to Stand: Min assist, Mod assist ?  ?Step pivot transfers: Min assist, Mod assist ?  ?  ?  ?General transfer comment: as per OT notes ?  ? ?Ambulation/Gait ?Ambulation/Gait assistance: Mod assist ?Gait Distance (Feet): 15 Feet ?Assistive device: Rolling walker (2 wheels) ?Gait Pattern/deviations: Decreased step length - right, Decreased step length - left, Decreased stance time - right, Decreased stride length, Knees buckling ?Gait velocity: decreased ?  ?  ?  General Gait Details: slow labored movement with difficulty advancing RLE and holding onto RW with right hand, frequent buckling of knees and limited to taking steps at bedside mostly due to fatigue and having limited sleep overnight ? ?Stairs ?   ?  ?  ?  ?  ? ?Wheelchair Mobility ?  ? ?Modified Rankin (Stroke Patients Only) ?  ? ?  ? ?Balance Overall balance assessment: Needs assistance ?Sitting-balance support: Feet supported, No upper extremity supported ?Sitting balance-Leahy Scale: Fair ?Sitting balance - Comments: seated at EOB ?  ?Standing balance support: During functional activity, No upper extremity supported ?Standing balance-Leahy Scale: Poor ?Standing balance comment: standing without AD, fair/poor using RW ?  ?  ?  ?  ?  ?  ?  ?  ?  ?  ?  ?   ? ? ? ?Pertinent Vitals/Pain Pain Assessment ?Pain Assessment: No/denies pain  ? ? ?Home Living Family/patient expects to be discharged to:: Private residence ?Living Arrangements: Alone ?Available Help at Discharge: Family;Available PRN/intermittently ?Type of Home: House ?Home Access: Stairs to enter ?Entrance Stairs-Rails: None ?Entrance Stairs-Number of Steps: 1 ?Alternate Level Stairs-Number of Steps: 6 (5 to 6 steps to basement with L rail going down.) ?Home Layout: Other (Comment);Multi-level (Split level) ?Home Equipment: Shower seat;Hand held shower head ?   ?  ?Prior Function Prior Level of Function : Independent/Modified Independent ?  ?  ?  ?  ?  ?  ?Mobility Comments: Independent ambulation without AD. ?ADLs Comments: Uses [employer] delivery for grocerist with family getting groceries some. Independent ADL's and IADL's. ?  ? ? ?Hand Dominance  ? Dominant Hand: Right ? ?  ?Extremity/Trunk Assessment  ? Upper Extremity Assessment ?Upper Extremity Assessment: Defer to OT evaluation ?RUE Deficits / Details: 2+/5 shoulder flexion and abduction, 3+/5 elbow flexion and extension, 3+/5 grip. WFL P/ROM. ?RUE Sensation:  (not tested; numbness reported initial MD report.) ?RUE Coordination: decreased fine motor;decreased gross motor ?LUE Deficits / Details: Gnerally weak. ?LUE Coordination: WNL ?  ? ?Lower Extremity Assessment ?Lower Extremity Assessment: Generalized weakness;RLE deficits/detail ?RLE  Deficits / Details: grossly -4/5 ?RLE Sensation: WNL ?RLE Coordination: decreased gross motor ?  ? ?Cervical / Trunk Assessment ?Cervical / Trunk Assessment: Normal  ?Communication  ? Communication: No difficulties  ?Cognition Arousal/Alertness: Awake/alert, Lethargic ?Behavior During Therapy: [facility] for tasks assessed/performed ?Overall Cognitive Status: Within Functional Limits for tasks assessed ?  ?  ?  ?  ?  ?  ?  ?  ?  ?  ?  ?  ?  ?  ?  ?  ?  ?  ?  ? ?  ?General Comments   ? ?  ?Exercises    ? ?Assessment/Plan  ?  ?PT Assessment Patient needs continued PT services  ?PT Problem List Decreased strength;Decreased activity tolerance;Decreased balance;Decreased mobility;Decreased coordination ? ?   ?  ?PT Treatment Interventions DME instruction;Gait training;Stair training;Functional mobility training;Therapeutic activities;Therapeutic exercise;Balance training;Neuromuscular re-education;Patient/family education   ? ?PT Goals (Current goals can be found in the Care Plan section)  ?Acute Rehab PT Goals ?Patient Stated Goal: return home after rehab ?PT Goal Formulation: With patient/family ?Time For Goal Achievement: 10/28/21 ?Potential to Achieve Goals: Good ? ?  ?Frequency Min 4X/week ?  ? ? ?Co-evaluation PT/OT/SLP Co-Evaluation/Treatment: Yes ?Reason for Co-Treatment: To address functional/ADL transfers;Complexity of the patient's impairments (multi-system involvement) ?PT goals addressed during session: Mobility/safety with mobility;Balance;Proper use of DME ?OT goals addressed during session: ADL's and self-care ?  ? ? ?  ?AM-PAC PT "  6 Clicks" Mobility  ?Outcome Measure Help needed turning from your back to your side while in a flat bed without using bedrails?: A Little ?Help needed moving from lying on your back to sitting on the side of a flat bed without using bedrails?: A Little ?Help needed moving to and from a bed to a chair (including a wheelchair)?: A Lot ?Help needed standing up from a chair using your  arms (e.g., wheelchair or bedside chair)?: A Lot ?Help needed to walk in hospital room?: A Lot ?Help needed climbing 3-5 steps with a railing? : A Lot ?6 Click Score: 14 ? ?  ?End of Session   ?Activity Madilyn Fireman

## 2021-10-14 NOTE — ED Notes (Signed)
Pt returned from MRI. Tech reports pt vomited on self during imaging. Gown changed. Upon return pt had another episode of emesis when HOB raised. Denies nausea, but reports she will just suddenly vomit. Some lethargy noted but no change since beginning of shift. Attending Dr. Janee Morn notified, request for anti-emetic ?

## 2021-10-14 NOTE — Assessment & Plan Note (Addendum)
-   Patient was maintained on home regimen Coreg, losartan, spironolactone, Lasix.  ?-Outpatient follow-up with PCP. ?

## 2021-10-14 NOTE — Progress Notes (Signed)
Echocardiogram ?2D Echocardiogram has been performed. ? ?Leslie Kim Ricard Faulkner RDCS ?10/14/2021, 2:35 PM ?

## 2021-10-14 NOTE — Assessment & Plan Note (Addendum)
-   Well-controlled.   ?-Hemoglobin A1c 6.3 -6.5.   ?-Patient's oral hypoglycemic agents were held during the hospitalization and patient maintained on sliding scale insulin.  -Oral hypoglycemic medications will be resumed on discharge. ?

## 2021-10-14 NOTE — Plan of Care (Signed)
?  Problem: Acute Rehab PT Goals(only PT should resolve) ?Goal: Pt Will Go Supine/Side To Sit ?Outcome: Progressing ?Flowsheets (Taken 10/14/2021 1123) ?Pt will go Supine/Side to Sit: ? with supervision ? with min guard assist ?Goal: Patient Will Transfer Sit To/From Stand ?Outcome: Progressing ?Flowsheets (Taken 10/14/2021 1123) ?Patient will transfer sit to/from stand: ? with min guard assist ? with minimal assist ?Goal: Pt Will Transfer Bed To Chair/Chair To Bed ?Outcome: Progressing ?Flowsheets (Taken 10/14/2021 1123) ?Pt will Transfer Bed to Chair/Chair to Bed: with min assist ?Goal: Pt Will Ambulate ?Outcome: Progressing ?Flowsheets (Taken 10/14/2021 1123) ?Pt will Ambulate: ? 50 feet ? with minimal assist ? with moderate assist ? with rolling walker ? with least restrictive assistive device ?  ?11:24 AM, 10/14/21 ?Ocie Bob, MPT ?Physical Therapist with [facility] ?[facility] ?[phone] office ?[phone] mobile phone ? ?

## 2021-10-14 NOTE — TOC Progression Note (Signed)
?  Transition of Care (TOC) Screening Note ? ? ?Patient Details  ?Name: Leslie Kim ?Date of Birth: [birth year] ? ? ?Transition of Care (TOC) CM/SW Contact:    ?Elliot Gault, LCSW ?Phone Number: ?10/14/2021, 11:03 AM ? ? ? ?PT recommending CIR at dc. TOC will follow up if plan changes. ? ?Transition of Care Department [facility]) has reviewed patient and no TOC needs have been identified at this time. We will continue to monitor patient advancement through interdisciplinary progression rounds. If new patient transition needs arise, please place a TOC consult. ? ? ?

## 2021-10-14 NOTE — Evaluation (Signed)
Occupational Therapy Evaluation ?Patient Details ?Name: Leslie Kim ?MRN: [id number] ?DOB: 05/27/1936 ?Today's Date: 10/14/2021 ? ? ?History of Present Illness Leslie Kim is a 86 y.o. female with medical history significant of CHF, COPD, hypertension, atrial fibrillation, diabetes mellitus type 2, presents the ER with a chief complaint of right arm and leg weakness.  Patient reports that Tuesday night she noticed numbness in her right hand and right leg.  She noticed it when she could not use the dexterity of her right hand to order things from the store on her phone.  Patient is right-hand dominant.  Patient reports no falls, no trouble with vision, speaking, drooling, hearing at that time.  She reports no numbness in her arm or leg.  Her legs do jump sometimes.  She reports the weakness in her legs is worse when she is trying to walk.  Patient has no other complaints at this time.  Review of Systems: As mentioned in the history of present illness. All other systems reviewed and are negative. (taken per MD note)  ? ?Clinical Impression ?  ?Pt agreeable to OT and PT co-evaluation. Pt lethargic at start of session with intermittent increase in arousal level. Pt is independent at baseline mostly staying at home with grocery delivery.  Pt presents with weakness, decreased ROM, and decreased gross and fine motor coordination in R UE. Pt unsteady in standing needing min to mod A for ambulation at the side of the bed. Pt also needs min to mod A for bed mobility and max A for most lower body dressing and bathing tasks. Pt vision requires further testing but upon initial evaluation showed limitations in L visual field. Pt was left in bed with call bell within reach and family present. Pt will benefit from continued OT in the hospital and recommended venue below to increase strength, balance, and endurance for safe ADL's.  ? ?  ? ?Recommendations for follow up therapy are one component of a multi-disciplinary discharge  planning process, led by the attending physician.  Recommendations may be updated based on patient status, additional functional criteria and insurance authorization.  ? ?Follow Up Recommendations ? Acute inpatient rehab (3hours/day)  ?  ?Assistance Recommended at Discharge Intermittent Supervision/Assistance  ?Patient can return home with the following A little help with walking and/or transfers;A lot of help with bathing/dressing/bathroom;Assistance with cooking/housework;Assistance with feeding;Assist for transportation;Help with stairs or ramp for entrance ? ?  ?Functional Status Assessment ? Patient has had a recent decline in their functional status and demonstrates the ability to make significant improvements in function in a reasonable and predictable amount of time.  ?Equipment Recommendations ?   None  ? ?  ?   ? ? ?  ?Precautions / Restrictions Precautions ?Precautions: Fall ?Restrictions ?Weight Bearing Restrictions: No  ? ?  ? ?Mobility Bed Mobility ?Overal bed mobility: Needs Assistance ?Bed Mobility: Supine to Sit, Sit to Supine ?  ?  ?Supine to sit: Min assist, HOB elevated ?Sit to supine: Min assist, Mod assist ?  ?General bed mobility comments: Labored movement. Assist needed to bring B LE into bed. ?  ? ?Transfers ?Overall transfer level: Needs assistance ?Equipment used: Rolling walker (2 wheels) ?Transfers: Sit to/from Stand, Bed to chair/wheelchair/BSC ?Sit to Stand: Min assist, Mod assist ?  ?  ?Step pivot transfers: Min assist, Mod assist ?  ?  ?General transfer comment: Slow labored movement. R LE intermittently buckling during standing and ambulation at side of bed. ?  ? ?  ?  Balance Overall balance assessment: Needs assistance ?Sitting-balance support: No upper extremity supported, Feet supported ?Sitting balance-Leahy Scale: Fair ?Sitting balance - Comments: fair to good seated EOB ?  ?Standing balance support: Bilateral upper extremity supported, During functional activity, Reliant on  assistive device for balance ?  ?  ?  ?  ?  ?  ?  ?  ?  ?  ?  ?  ?  ?   ? ?ADL either performed or assessed with clinical judgement  ? ?ADL Overall ADL's : Needs assistance/impaired ?Eating/Feeding: Set up;Sitting ?  ?Grooming: Moderate assistance;Maximal assistance;Standing ?  ?Upper Body Bathing: Minimal assistance;Sitting ?  ?Lower Body Bathing: Maximal assistance;Bed level ?  ?Upper Body Dressing : Minimal assistance;Sitting ?  ?Lower Body Dressing: Maximal assistance;Bed level ?Lower Body Dressing Details (indicate cue type and reason): Assited to don socks supine in bed. ?Toilet Transfer: Moderate assistance;Minimal assistance;Stand-pivot;Rolling walker (2 wheels) ?Toilet Transfer Details (indicate cue type and reason): Partially simulated via ambulation at EOB at return to EOB. ?Toileting- Clothing Manipulation and Hygiene: Sitting/lateral lean;Minimal assistance;Moderate assistance ?  ?Tub/ Shower Transfer: Maximal assistance;Rolling walker (2 wheels);Stand-pivot ?  ?Functional mobility during ADLs: Moderate assistance;Minimal assistance;Rolling walker (2 wheels) ?General ADL Comments: Limited by R UE weakness and decrease in A/ROM as well as unsteadiness on feet.  ? ? ? ?Vision Baseline Vision/History: 0 No visual deficits ?Ability to See in Adequate Light: 0 Adequate ?Patient Visual Report: No change from baseline ?Vision Assessment?: Yes ?Alignment/Gaze Preference: Within Defined Limits ?Tracking/Visual Pursuits: Able to track stimulus in all quads without difficulty ?Visual Fields: Left visual field deficit (Possible L visual field deficit. Seeming to be worse in superior field. More testing needed when pt is more awake.)  ?   ?   ?  ?   ?  ? ?Pertinent Vitals/Pain Pain Assessment ?Pain Assessment: No/denies pain  ? ? ? ?Hand Dominance Right ?  ?Extremity/Trunk Assessment Upper Extremity Assessment ?Upper Extremity Assessment: RUE deficits/detail;LUE deficits/detail ?RUE Deficits / Details: 2+/5 shoulder  flexion and abduction, 3+/5 elbow flexion and extension, 3+/5 grip. WFL P/ROM. ?RUE Sensation:  (not tested; numbness reported initial MD report.) ?RUE Coordination: decreased fine motor;decreased gross motor ?LUE Deficits / Details: Gnerally weak. ?LUE Coordination: WNL ?  ?Lower Extremity Assessment ?Lower Extremity Assessment: Defer to PT evaluation ?  ?Cervical / Trunk Assessment ?Cervical / Trunk Assessment: Normal ?  ?Communication Communication ?Communication: No difficulties ?  ?Cognition Arousal/Alertness: Lethargic ?Behavior During Therapy: [facility] for tasks assessed/performed ?Overall Cognitive Status: Within Functional Limits for tasks assessed ?  ?  ?  ?  ?  ?  ?  ?  ?  ?  ?  ?  ?  ?  ?  ?  ?General Comments: Pt somewhat lethargic with reports of not sleeping well the past few days. ?  ?  ?   ? ?  ?   ?  ?    ? ? ?Home Living Family/patient expects to be discharged to:: Private residence ?Living Arrangements: Alone ?Available Help at Discharge: Family;Available PRN/intermittently ?Type of Home: House ?Home Access: Stairs to enter ?Entrance Stairs-Number of Steps: 1 ?Entrance Stairs-Rails: None ?Home Layout: Other (Comment);Multi-level (Split level) ?Alternate Level Stairs-Number of Steps: 6 (5 to 6 steps to basement with L rail going down.) ?Alternate Level Stairs-Rails: Right (going up) ?Bathroom Shower/Tub: Walk-in shower ?  ?Bathroom Toilet: Standard ?  ?  ?Home Equipment: Shower seat;Hand held shower head ?  ?  ?  ? ?  ?Prior Functioning/Environment Prior Level of  Function : Independent/Modified Independent ?  ?  ?  ?  ?  ?  ?Mobility Comments: Independent ambulation without AD. ?ADLs Comments: Uses [employer] delivery for grocerist with family getting groceries some. Independent ADL's and IADL's. ?  ? ?  ?  ?OT Problem List: Decreased strength;Decreased range of motion;Decreased activity tolerance;Impaired balance (sitting and/or standing);Impaired vision/perception;Decreased coordination;Impaired  sensation;Impaired UE functional use ?  ?   ?OT Treatment/Interventions: Self-care/ADL training;Therapeutic exercise;Therapeutic activities;Patient/family education;Balance training;DME and/or AE instruction;Neu

## 2021-10-14 NOTE — ED Notes (Signed)
Pt noted to have extremity restriction to right side d/t mastectomy. This was not noted on arrival and IV was placed on right side during prior shift. IV removed and restriction band placed on right side. Attempting new IV prior to sending pt to floor.  ?

## 2021-10-15 DIAGNOSIS — K219 Gastro-esophageal reflux disease without esophagitis: Secondary | ICD-10-CM

## 2021-10-15 DIAGNOSIS — I639 Cerebral infarction, unspecified: Secondary | ICD-10-CM

## 2021-10-15 DIAGNOSIS — I10 Essential (primary) hypertension: Secondary | ICD-10-CM

## 2021-10-15 LAB — BASIC METABOLIC PANEL
Anion gap: 10 (ref 5–15)
BUN: 21 mg/dL (ref 8–23)
CO2: 24 mmol/L (ref 22–32)
Calcium: 9.3 mg/dL (ref 8.9–10.3)
Chloride: 103 mmol/L (ref 98–111)
Creatinine, Ser: 1.02 mg/dL — ABNORMAL HIGH (ref 0.44–1.00)
GFR, Estimated: 55 mL/min — ABNORMAL LOW (ref >60)
Glucose, Bld: 136 mg/dL — ABNORMAL HIGH (ref 70–99)
Potassium: 3.9 mmol/L (ref 3.5–5.1)
Sodium: 137 mmol/L (ref 135–145)

## 2021-10-15 LAB — HEMOGLOBIN A1C
Hgb A1c MFr Bld: 6.3 % — ABNORMAL HIGH (ref 4.8–5.6)
Mean Plasma Glucose: 134.11 mg/dL

## 2021-10-15 LAB — CBC
HCT: 34.4 % — ABNORMAL LOW (ref 36.0–46.0)
Hemoglobin: 10.8 g/dL — ABNORMAL LOW (ref 12.0–15.0)
MCH: 27.3 pg (ref 26.0–34.0)
MCHC: 31.4 g/dL (ref 30.0–36.0)
MCV: 87.1 fL (ref 80.0–100.0)
Platelets: 250 10*3/uL (ref 150–400)
RBC: 3.95 MIL/uL (ref 3.87–5.11)
RDW: 15.4 % (ref 11.5–15.5)
WBC: 8.4 10*3/uL (ref 4.0–10.5)
nRBC: 0 % (ref 0.0–0.2)

## 2021-10-15 LAB — IRON AND TIBC
Iron: 102 ug/dL (ref 28–170)
Saturation Ratios: 27 % (ref 10.4–31.8)
TIBC: 383 ug/dL (ref 250–450)
UIBC: 281 ug/dL

## 2021-10-15 LAB — GLUCOSE, CAPILLARY
Glucose-Capillary: 122 mg/dL — ABNORMAL HIGH (ref 70–99)
Glucose-Capillary: 187 mg/dL — ABNORMAL HIGH (ref 70–99)
Glucose-Capillary: 126 mg/dL — ABNORMAL HIGH (ref 70–99)
Glucose-Capillary: 135 mg/dL — ABNORMAL HIGH (ref 70–99)

## 2021-10-15 LAB — FOLATE: Folate: 32.3 ng/mL (ref >5.9)

## 2021-10-15 LAB — FERRITIN: Ferritin: 14 ng/mL (ref 11–307)

## 2021-10-15 LAB — VITAMIN B12: Vitamin B-12: 321 pg/mL (ref 180–914)

## 2021-10-15 LAB — MAGNESIUM: Magnesium: 1.8 mg/dL (ref 1.7–2.4)

## 2021-10-15 MED ORDER — FUROSEMIDE 40 MG PO TABS
40.0000 mg | ORAL_TABLET | Freq: Every day | ORAL | Status: DC
  Administered 2021-10-15 – 2021-10-17 (×3): 40 mg via ORAL
  Filled 2021-10-15 (×3): qty 1

## 2021-10-15 MED ORDER — FLUTICASONE FUROATE-VILANTEROL 100-25 MCG/ACT IN AEPB
1.0000 | INHALATION_SPRAY | Freq: Every day | RESPIRATORY_TRACT | Status: DC
  Administered 2021-10-15 – 2021-10-16 (×2): 1 via RESPIRATORY_TRACT
  Filled 2021-10-15: qty 28

## 2021-10-15 MED ORDER — VITAMIN D 25 MCG (1000 UNIT) PO TABS
2000.0000 [IU] | ORAL_TABLET | Freq: Every day | ORAL | Status: DC
  Administered 2021-10-15 – 2021-10-17 (×3): 2000 [IU] via ORAL
  Filled 2021-10-15 (×3): qty 2

## 2021-10-15 MED ORDER — FERROUS SULFATE 325 (65 FE) MG PO TABS
324.0000 mg | ORAL_TABLET | Freq: Every day | ORAL | Status: DC
  Administered 2021-10-15 – 2021-10-16 (×2): 324 mg via ORAL
  Filled 2021-10-15 (×2): qty 1

## 2021-10-15 MED ORDER — HYDRALAZINE HCL 20 MG/ML IJ SOLN
5.0000 mg | Freq: Four times a day (QID) | INTRAMUSCULAR | Status: DC | PRN
  Filled 2021-10-15: qty 1

## 2021-10-15 MED ORDER — UMECLIDINIUM BROMIDE 62.5 MCG/ACT IN AEPB
1.0000 | INHALATION_SPRAY | Freq: Every day | RESPIRATORY_TRACT | Status: DC
  Administered 2021-10-15 – 2021-10-16 (×2): 1 via RESPIRATORY_TRACT
  Filled 2021-10-15: qty 7

## 2021-10-15 NOTE — Progress Notes (Signed)
?PROGRESS NOTE ? ? ? ?Leslie Kim  [id number] DOB: 1936/01/23 DOA: 10/13/2021 ?PCP: Celene Squibb, MD  ? ? ?Chief Complaint  ?Patient presents with  ? Right Sided Weakness  ? ? ?Brief Narrative:  ?No notes on file  ? ? ?Assessment & Plan: ? Principal Problem: ?  CVA (cerebral vascular accident) ([place]) ?Active Problems: ?  HTN (hypertension) ?  Diabetes mellitus ([place]) ?  Atrial fibrillation ([place]) ?  Gastroesophageal reflux disease without esophagitis ?  Essential hypertension ?  Ischemic stroke ([place]) ? ? ? ?Assessment and Plan: ?* CVA (cerebral vascular accident) ([place]) ?-Patient presented with right arm and leg weakness. ?-CT head done on admission negative. ?-MRI brain done with acute small vessel infarct of the left corona radiator.  Mild to moderate chronic microvascular ischemic changes. ?-MRA head and neck done with plaque on the right > left ICA origins with less than 50% stenosis.  No LVO. ?-Patient noted to be out of the window for tPA and also out of window for permissive hypertension. ?-2D echo with normal EF, no source of emboli noted, no PFO. ?-Hemoglobin A1c noted at 6.5. ?-Fasting lipid panel of 152.  Goal LDL < 70. ?-Patient started on Lipitor 40 mg daily. ?-Continue home regimen Eliquis. ?-Patient seen in consultation by neurology. ?-PT/OT assessed patient and recommending CIR. ?-We will need outpatient follow-up with neurology 3 to 4 weeks postdischarge. ? ?Gastroesophageal reflux disease without esophagitis ?Continue PPI ? ?Atrial fibrillation ([place]) ?- Continue Coreg for rate control.   ?-Eliquis for anticoagulation.   ? ?Diabetes mellitus ([place]) ?- Well-controlled.   ?-Hemoglobin A1c 6.3 -6.5.   ?-CBG 122 this morning.   ?-Continue to hold Janumet.   ?-SSI.  ? ?HTN (hypertension) ?- Continue home regimen Coreg.   ?-Losartan resumed last night.   ?-Continue home regiment spironolactone.   ?-Gradually normalize blood pressure over the next 3 to 4 days.  -May need addition of Norvasc.   ?-Follow.   ? ? ? ? ?  ? ? ?DVT prophylaxis: Lovenox ?Code Status: DNR ?Family Communication: Updated son and daughter-in-law at bedside. ?Disposition: Inpatient rehab when bed available hopefully on 10/17/2021 ? ?Status is: Inpatient ?Remains inpatient appropriate because: Unsafe disposition ?  ?Consultants:  ?Neurology: Dr.Bhagat 10/14/2021 ?Telemetry neurology: Dr. Thayer Ohm 10/13/2021 ? ?Procedures:  ?2D echo 10/14/2021 ?CT head 10/13/2021 ?MRI brain 10/14/2021 ?MRA head and neck 10/14/2021 ? ? ? ? ?Antimicrobials:  ?None ? ? ?Subjective: ?Sitting up in bed.  Could not quite remember me from yesterday as noted to have been up all night the night prior in the ED.  Feeling much better today.  Denies any chest pain.  No shortness of breath.  Some improvement with right-sided weakness however still with difficulty with dexterity and right hand.  Son and daughter-in-law at bedside. ? ?Objective: ?Vitals:  ? 10/14/21 1800 10/14/21 2128 10/15/21 0514 10/15/21 1320  ?BP: (!) 182/71 (!) 172/80 (!) 157/52 (!) 181/83  ?Pulse: 79 82 80 76  ?Resp: 18   16  ?Temp:  98.6 ?F (37 ?C)  97.7 ?F (36.5 ?C)  ?TempSrc:    Oral  ?SpO2: 97% 96% 96% 97%  ?Weight:      ?Height:      ? ? ?Intake/Output Summary (Last 24 hours) at 10/15/2021 1435 ?Last data filed at 10/15/2021 0300 ?Gross per 24 hour  ?Intake 1578.64 ml  ?Output --  ?Net 1578.64 ml  ? ?Filed Weights  ? 10/13/21 1954  ?Weight: 74.8 kg  ? ? ?Examination: ? ?General  exam: Appears calm and comfortable  ?Respiratory system: Clear to auscultation. Respiratory effort normal. ?Cardiovascular system: S1 & S2 heard, RRR. No JVD, murmurs, rubs, gallops or clicks. No pedal edema. ?Gastrointestinal system: Abdomen is nondistended, soft and nontender. No organomegaly or masses felt. Normal bowel sounds heard. ?Central nervous system: Alert and oriented. No focal neurological deficits.4/5 right upper extremity strength, 4/5 right lower extremity strength.  Unable to elicit reflexes symmetrically and diffusely.   Sensation intact.  Gait not tested secondary to safety. ?Extremities: Symmetric 5 x 5 power. ?Skin: No rashes, lesions or ulcers ?Psychiatry: Judgement and insight appear normal. Mood & affect appropriate.  ? ? ? ?Data Reviewed:  ? ?CBC: ?Recent Labs  ?Lab 10/13/21 ?2038 10/14/21 ?0342 10/15/21 ?[id number]  ?WBC 8.7 10.3 8.4  ?NEUTROABS 5.4  --   --   ?HGB 10.8* 10.6* 10.8*  ?HCT 35.4* 34.4* 34.4*  ?MCV 84.9 84.9 87.1  ?PLT 256 243 250  ? ? ?Basic Metabolic Panel: ?Recent Labs  ?Lab 10/13/21 ?2038 10/14/21 ?0342 10/15/21 ?[id number]  ?NA 133* 135 137  ?K 3.9 4.2 3.9  ?CL 99 99 103  ?CO2 23 26 24   ?GLUCOSE 155* 142* 136*  ?BUN 30* 31* 21  ?CREATININE 1.33* 1.12* 1.02*  ?CALCIUM 9.6 9.8 9.3  ?MG  --   --  1.8  ? ? ?GFR: ?Estimated Creatinine Clearance: 40.5 mL/min (A) (by C-G formula based on SCr of 1.02 mg/dL (H)). ? ?Liver Function Tests: ?Recent Labs  ?Lab 10/13/21 ?2038 10/14/21 ?0342  ?AST 19 17  ?ALT 14 14  ?ALKPHOS 55 52  ?BILITOT 0.4 0.6  ?PROT 8.2* 7.7  ?ALBUMIN 3.9 3.7  ? ? ?CBG: ?Recent Labs  ?Lab 10/14/21 ?0845 10/14/21 ?1231 10/14/21 ?2239 10/15/21 ?0757 10/15/21 ?1129  ?GLUCAP 149* 140* 120* 122* 187*  ? ? ? ?Recent Results (from the past 240 hour(s))  ?Resp Panel by RT-PCR (Flu A&B, Covid) Nasopharyngeal Swab     Status: None  ? Collection Time: 10/13/21  8:24 PM  ? Specimen: Nasopharyngeal Swab; Nasopharyngeal(NP) swabs in vial transport medium  ?Result Value Ref Range Status  ? SARS Coronavirus 2 by RT PCR NEGATIVE NEGATIVE Final  ?  Comment: (NOTE) ?SARS-CoV-2 target nucleic acids are NOT DETECTED. ? ?The SARS-CoV-2 RNA is generally detectable in upper respiratory ?specimens during the acute phase of infection. The lowest ?concentration of SARS-CoV-2 viral copies this assay can detect is ?138 copies/mL. A negative result does not preclude SARS-Cov-2 ?infection and should not be used as the sole basis for treatment or ?other patient management decisions. A negative result may occur with  ?improper specimen  collection/handling, submission of specimen other ?than nasopharyngeal swab, presence of viral mutation(s) within the ?areas targeted by this assay, and inadequate number of viral ?copies(<138 copies/mL). A negative result must be combined with ?clinical observations, patient history, and epidemiological ?information. The expected result is Negative. ? ?Fact Sheet for Patients:  ?EntrepreneurPulse.com.au ? ?Fact Sheet for Healthcare Providers:  ?IncredibleEmployment.be ? ?This test is no t yet approved or cleared by the [place] FDA and  ?has been authorized for detection and/or diagnosis of SARS-CoV-2 by ?FDA under an Emergency Use Authorization (EUA). This EUA will remain  ?in effect (meaning this test can be used) for the duration of the ?COVID-19 declaration under Section 564(b)(1) of the Act, 21 ?U.S.C.section 360bbb-3(b)(1), unless the authorization is terminated  ?or revoked sooner.  ? ? ?  ? Influenza A by PCR NEGATIVE NEGATIVE Final  ? Influenza B by PCR NEGATIVE NEGATIVE  Final  ?  Comment: (NOTE) ?The Xpert Xpress SARS-CoV-2/FLU/RSV plus assay is intended as an aid ?in the diagnosis of influenza from Nasopharyngeal swab specimens and ?should not be used as a sole basis for treatment. Nasal washings and ?aspirates are unacceptable for Xpert Xpress SARS-CoV-2/FLU/RSV ?testing. ? ?Fact Sheet for Patients: ?EntrepreneurPulse.com.au ? ?Fact Sheet for Healthcare Providers: ?IncredibleEmployment.be ? ?This test is not yet approved or cleared by the [place] FDA and ?has been authorized for detection and/or diagnosis of SARS-CoV-2 by ?FDA under an Emergency Use Authorization (EUA). This EUA will remain ?in effect (meaning this test can be used) for the duration of the ?COVID-19 declaration under Section 564(b)(1) of the Act, 21 U.S.C. ?section 360bbb-3(b)(1), unless the authorization is terminated or ?revoked. ? ?Performed at [facility], [address] [place], [place] [address] ?  ?  ? ? ? ? ? ?Radiology Studies: ?CT HEAD WO CONTRAST ? ?Result Date: 10/13/2021 ?CLINICAL DATA:  Neurologic deficit.  Concern for stroke. EXAM: CT HEAD [facility]

## 2021-10-15 NOTE — Progress Notes (Signed)
Patient's bp was 181/83. Rechecked and it was 172/69 and hr 73. MD Janee Morn notified. PRN hydralazine held due to bp decreasing below the parameters. MD Janee Morn reordered to start backup lasix.  ?

## 2021-10-15 NOTE — TOC Progression Note (Addendum)
Transition of Care (TOC) - Progression Note  ? ? ?Patient Details  ?Name: Leslie Kim ?MRN: [id number] ?Date of Birth: April 05, 1936 ? ?Transition of Care (TOC) CM/SW Contact  ?Leitha Bleak, RN ?Phone Number: ?10/15/2021, 1:11 PM ? ?Clinical Narrative:   TOC called Daughter, Enid Derry. Family is agreeable to CIR. MD updated and sent a Chat to Admission CIR, to start INS AUTH. Patient is medically ready to transfer.  ? ?Addendum:  CIR has INS AUTH.  They will update TOC with bed status Monday. Plan to DC to CIR Monday. Carole updated. ? ?Expected Discharge Plan: IP Rehab Facility ?Barriers to Discharge: Continued Medical Work up ? ?Expected Discharge Plan and Services ?Expected Discharge Plan: IP Rehab Facility ?  ?       ?Readmission Risk Interventions ?Readmission Risk Prevention Plan 10/15/2021  ?Transportation Screening Complete  ?Home Care Screening Complete  ?Medication Review (RN CM) Complete  ?Some recent data might be hidden  ? ? ?

## 2021-10-16 LAB — BASIC METABOLIC PANEL
Anion gap: 11 (ref 5–15)
BUN: 21 mg/dL (ref 8–23)
CO2: 24 mmol/L (ref 22–32)
Calcium: 9.5 mg/dL (ref 8.9–10.3)
Chloride: 101 mmol/L (ref 98–111)
Creatinine, Ser: 1.2 mg/dL — ABNORMAL HIGH (ref 0.44–1.00)
GFR, Estimated: 45 mL/min — ABNORMAL LOW (ref >60)
Glucose, Bld: 146 mg/dL — ABNORMAL HIGH (ref 70–99)
Potassium: 3.7 mmol/L (ref 3.5–5.1)
Sodium: 136 mmol/L (ref 135–145)

## 2021-10-16 LAB — GLUCOSE, CAPILLARY
Glucose-Capillary: 134 mg/dL — ABNORMAL HIGH (ref 70–99)
Glucose-Capillary: 242 mg/dL — ABNORMAL HIGH (ref 70–99)
Glucose-Capillary: 113 mg/dL — ABNORMAL HIGH (ref 70–99)
Glucose-Capillary: 142 mg/dL — ABNORMAL HIGH (ref 70–99)

## 2021-10-16 NOTE — Assessment & Plan Note (Signed)
See above CVA ?

## 2021-10-16 NOTE — PMR Pre-admission (Signed)
PMR Admission Coordinator Pre-Admission Assessment ? ?Patient: Leslie Kim is an 86 y.o., female ?MRN: [id number] ?DOB: [birth year] ?Height: 5' 3"  (160 cm) ?Weight: 74.8 kg ? ?Insurance Information ?HMO:     PPO: yes      PCP:      IPA:      80/20:      OTHER:  ?PRIMARY: UHC Medicare      Policy#: [id number]      Subscriber: Pt. ?CM Name:       Phone#: [phone]     Fax#: [phone] ?Pre-Cert#: [id number]      Employer:  I received a call from [employer] 3/17 with approval 3/18 -3/24 with updates due 3/24 ?Benefits:  Phone #:      Name:  ?Irene Shipper Date: 08/31/2021 - still active ?Deductible: $0 (does not have deductible) ?OOP Max: $3,900 ($0 met)  ?CIR: $295/day co-pay for days 1-5, $0/day co-pay for days 6+  ?SNF: $0.00 Copayment per day for days 1-20; $196.00 Copayment per day for days 21-40; $0.00 Copayment per day for days 41-100 for Medicare-covered care/maximum 100 days/benefit period  ?Outpatient: $20/visit co-pay  ?Home Health:  100% coverage, 0% co-insurance; limited by medical necessity  ?DME: 80% coverage; 20% co-insurance  ?Providers: In network ? ?SECONDARY:       Policy#:      Phone#:  ? ? ?The ?Data Collection Information Summary? for patients in Inpatient Rehabilitation Facilities with attached ?Privacy Act [place] Records? was provided and verbally reviewed with: Family  ? ?Emergency Contact Information ?Contact Information   ? ? Name Relation Home Work Mobile  ? Moore,Carole Daughter   [phone]  ? Flinchbaugh,Richard Spouse [phone]    ? Aamina, Skiff [phone]    ? Markeisha, Mancias Son [phone]    ? ?  ? ? ?Current Medical History  ?Patient Admitting Diagnosis: CVA ?History of Present Illness: Leslie Kim is a 86 y.o. female with medical history significant of CHF, COPD, hypertension, atrial fibrillation, diabetes mellitus type 2, presented to  the [facility]  ER 10/14/21 with a chief complaint of right arm and leg weakness. MRI angio of the brain showed Acute small  vessel infarct of the left corona radiata, mild to moderate chronic microvascular ischemic changes, and laque at the right greater than left ICA origins with less than 50% ?stenosis. PT/OT saw Pt. And recommended CIR to assist return to PLOF.  ?Complete NIHSS TOTAL: 1 ? ?Patient's medical record from [facility] has been reviewed by the rehabilitation admission coordinator and physician. ? ?Past Medical History  ?Past Medical History:  ?Diagnosis Date  ? Allergy   ? Cataract   ? CHF (congestive heart failure) ([place])   ? COPD (chronic obstructive pulmonary disease) ([place])   ? Essential hypertension   ? New onset atrial fibrillation ([place]) 11/2015  ? a. s/p DCCV on 12/18/2015 b. started on Eliquis  ? Nonischemic cardiomyopathy ([place])   ? a. 11/2015: echo showing EF of 30-35%, cath with nonobstructive dz  ? Type 2 diabetes mellitus ([place])   ? ? ?Has the patient had major surgery during 100 days prior to admission? No ? ?Family History   ?family history includes Cancer in her father; Diabetes in her brother, brother, brother, daughter, father, mother, and son; Prostate cancer in her father. ? ?Current Medications ? ?Current Facility-Administered Medications:  ?   stroke: mapping our early stages of recovery book, , Does not apply, Once, Zierle-Ghosh, Asia B, DO ?  acetaminophen (TYLENOL) tablet 650  mg, 650 mg, Oral, Q4H PRN, 650 mg at 10/14/21 1908 **OR** acetaminophen (TYLENOL) 160 MG/5ML solution 650 mg, 650 mg, Per Tube, Q4H PRN **OR** acetaminophen (TYLENOL) suppository 650 mg, 650 mg, Rectal, Q4H PRN, Zierle-Ghosh, Asia B, DO ?  apixaban (ELIQUIS) tablet 5 mg, 5 mg, Oral, BID, Bhagat, Srishti L, MD, 5 mg at 10/16/21 4742 ?  atorvastatin (LIPITOR) tablet 40 mg, 40 mg, Oral, q1800, Eugenie Filler, MD, 40 mg at 10/15/21 1629 ?  carvedilol (COREG) tablet 6.25 mg, 6.25 mg, Oral, BID WC, Zierle-Ghosh, Asia B, DO, 6.25 mg at 10/16/21 5956 ?  cholecalciferol (VITAMIN D3) tablet 2,000 Units, 2,000 Units, Oral, Daily,  Eugenie Filler, MD, 2,000 Units at 10/16/21 3875 ?  ferrous sulfate tablet 324 mg, 324 mg, Oral, Q breakfast, Eugenie Filler, MD, 324 mg at 10/16/21 6433 ?  fluticasone furoate-vilanterol (BREO ELLIPTA) 100-25 MCG/ACT 1 puff, 1 puff, Inhalation, Daily, 1 puff at 10/15/21 2137 **AND** umeclidinium bromide (INCRUSE ELLIPTA) 62.5 MCG/ACT 1 puff, 1 puff, Inhalation, Daily, Eugenie Filler, MD, 1 puff at 10/15/21 2137 ?  furosemide (LASIX) tablet 40 mg, 40 mg, Oral, Daily, Eugenie Filler, MD, 40 mg at 10/16/21 2951 ?  hydrALAZINE (APRESOLINE) injection 5 mg, 5 mg, Intravenous, Q6H PRN, Eugenie Filler, MD ?  insulin aspart (novoLOG) injection 0-15 Units, 0-15 Units, Subcutaneous, TID WC, Zierle-Ghosh, Asia B, DO, 2 Units at 10/16/21 0949 ?  insulin aspart (novoLOG) injection 0-5 Units, 0-5 Units, Subcutaneous, QHS, Zierle-Ghosh, Asia B, DO ?  losartan (COZAAR) tablet 50 mg, 50 mg, Oral, BID, Eugenie Filler, MD, 50 mg at 10/16/21 8841 ?  melatonin tablet 6 mg, 6 mg, Oral, QHS, Zierle-Ghosh, Asia B, DO, 6 mg at 10/15/21 2233 ?  pantoprazole (PROTONIX) EC tablet 40 mg, 40 mg, Oral, Daily, Zierle-Ghosh, Asia B, DO, 40 mg at 10/16/21 6606 ?  prochlorperazine (COMPAZINE) injection 5 mg, 5 mg, Intravenous, Q6H PRN, Eugenie Filler, MD ?  spironolactone (ALDACTONE) tablet 25 mg, 25 mg, Oral, Daily, Zierle-Ghosh, Asia B, DO, 25 mg at 10/16/21 3016 ? ?Patients Current Diet:  ?Diet Order   ? ?       ?  Diet Heart Room service appropriate? Yes; Fluid consistency: Thin  Diet effective now       ?  ? ?  ?  ? ?  ? ? ?Precautions / Restrictions ?Precautions ?Precautions: Fall ?Restrictions ?Weight Bearing Restrictions: No  ? ?Has the patient had 2 or more falls or a fall with injury in the past year? No ? ?Prior Activity Level ? Pt. Was active in the community PTA ? ?Prior Functional Level ?Self Care: Did the patient need help bathing, dressing, using the toilet or eating? Independent ? ?Indoor Mobility: Did the  patient need assistance with walking from room to room (with or without device)? Independent ? ?Stairs: Did the patient need assistance with internal or external stairs (with or without device)? Independent ? ?Functional Cognition: Did the patient need help planning regular tasks such as shopping or remembering to take medications? Independent ? ?Patient Information ?Are you of Hispanic, Latino/a,or Spanish origin?: A. No, not of Hispanic, Latino/a, or Spanish origin ?What is your race?: A. White ?Do you need or want an interpreter to communicate with a doctor or health care staff?: 0. No ? ?Patient's Response To:  ?Health Literacy and Transportation ?Is the patient able to respond to health literacy and transportation needs?: Yes ?Health Literacy - How often do you need to have someone  help you when you read instructions, pamphlets, or other written material from your doctor or pharmacy?: Never ?In the past 12 months, has lack of transportation kept you from medical appointments or from getting medications?: No ?In the past 12 months, has lack of transportation kept you from meetings, work, or from getting things needed for daily living?: No ? ?Home Assistive Devices / Equipment ?Home Assistive Devices/Equipment: None ?Home Equipment: Shower seat, Hand held shower head ? ?Prior Device Use: Indicate devices/aids used by the patient prior to current illness, exacerbation or injury? None of the above ? ?Current Functional Level ?Cognition ? Overall Cognitive Status: Within Functional Limits for tasks assessed ?Orientation Level: Oriented X4 ?General Comments: Pt somewhat lethargic with reports of not sleeping well the past few days. ?   ?Extremity Assessment ?(includes Sensation/Coordination) ? Upper Extremity Assessment: Defer to OT evaluation ?RUE Deficits / Details: 2+/5 shoulder flexion and abduction, 3+/5 elbow flexion and extension, 3+/5 grip. WFL P/ROM. ?RUE Sensation:  (not tested; numbness reported initial MD  report.) ?RUE Coordination: decreased fine motor, decreased gross motor ?LUE Deficits / Details: Gnerally weak. ?LUE Coordination: WNL  ?Lower Extremity Assessment: Generalized weakness, RLE deficits

## 2021-10-16 NOTE — Progress Notes (Signed)
Pt has taken all ordered medications this shift. She has sat in the chair several times today with encouragement. Pt is scoring a 1 On the NIHSS. Pt has had several family members visit today. Pt ate all three meals and has denied any pain this shift.  ?

## 2021-10-16 NOTE — Progress Notes (Signed)
?PROGRESS NOTE ? ? ? ?Leslie Kim  [id number] DOB: 1935-11-09 DOA: 10/13/2021 ?PCP: Celene Squibb, MD  ? ? ?Chief Complaint  ?Patient presents with  ? Right Sided Weakness  ? ? ?Brief Narrative:  ?No notes on file  ? ? ?Assessment & Plan: ? Principal Problem: ?  CVA (cerebral vascular accident) ([place]) ?Active Problems: ?  HTN (hypertension) ?  Diabetes mellitus ([place]) ?  Atrial fibrillation ([place]) ?  Gastroesophageal reflux disease without esophagitis ?  Essential hypertension ?  Ischemic stroke ([place]) ? ? ? ?Assessment and Plan: ?* CVA (cerebral vascular accident) ([place]) ?-Patient presented with right arm and leg weakness. ?-CT head done on admission negative. ?-MRI brain done with acute small vessel infarct of the left corona radiator.  Mild to moderate chronic microvascular ischemic changes. ?-MRA head and neck done with plaque on the right > left ICA origins with less than 50% stenosis.  No LVO. ?-Patient noted to be out of the window for tPA and also out of window for permissive hypertension. ?-2D echo with normal EF, no source of emboli noted, no PFO. ?-Hemoglobin A1c noted at 6.5. ?-Fasting lipid panel of 152.  Goal LDL < 70. ?-Patient started on Lipitor 40 mg daily. ?-Continue home regimen Eliquis. ?-Patient seen in consultation by neurology. ?-PT/OT assessed patient and recommending CIR. ?-We will need outpatient follow-up with neurology 3 to 4 weeks postdischarge. ? ?Ischemic stroke ([place]) ?See above CVA ? ?Gastroesophageal reflux disease without esophagitis ?Continue PPI ? ?Atrial fibrillation ([place]) ?- Continue Coreg for rate control.   ?-Eliquis for anticoagulation.   ? ?Diabetes mellitus ([place]) ?- Well-controlled.   ?-Hemoglobin A1c 6.3 -6.5.   ?-CBG 134 this morning. ?-Continue to hold Janumet and may resume on discharge. ?-SSI.  ? ?HTN (hypertension) ?- Continue home regimen Coreg, losartan, spironolactone, Lasix.  ?-Follow.  ? ? ? ? ?  ? ? ?DVT prophylaxis: Lovenox ?Code Status: DNR ?Family  Communication: Updated son and daughter at bedside. ?Disposition: Inpatient rehab when bed available hopefully on 10/17/2021 ? ?Status is: Inpatient ?Remains inpatient appropriate because: Unsafe disposition ?  ?Consultants:  ?Neurology: Dr.Bhagat 10/14/2021 ?Telemetry neurology: Dr. Thayer Ohm 10/13/2021 ? ?Procedures:  ?2D echo 10/14/2021 ?CT head 10/13/2021 ?MRI brain 10/14/2021 ?MRA head and neck 10/14/2021 ? ? ? ? ?Antimicrobials:  ?None ? ? ?Subjective: ?Sitting up in chair.  Still with difficulty with dexterity.  States weakness with no significant change over the past 24 hours.  No chest pain.  ? ?Objective: ?Vitals:  ? 10/15/21 2036 10/15/21 2137 10/16/21 0418 10/16/21 1444  ?BP: (!) 168/70  (!) 144/86 (!) 125/46  ?Pulse: 76  79 80  ?Resp:    17  ?Temp:    98.2 ?F (36.8 ?C)  ?TempSrc:      ?SpO2: 96% 95% 92% 96%  ?Weight:      ?Height:      ? ? ?Intake/Output Summary (Last 24 hours) at 10/16/2021 1715 ?Last data filed at 10/16/2021 1300 ?Gross per 24 hour  ?Intake 800 ml  ?Output --  ?Net 800 ml  ? ?Filed Weights  ? 10/13/21 1954  ?Weight: 74.8 kg  ? ? ?Examination: ? ?General exam: NAD ?Respiratory system: CTAB.  No wheezes, no crackles, no rhonchi.  Fair air movement.  Speaking in full sentences. ?Cardiovascular system: Regular rate rhythm no murmurs rubs or gallops.  No JVD.  No lower extremity edema. ?Gastrointestinal system: Abdomen is nondistended, soft and nontender. No organomegaly or masses felt. Normal bowel sounds heard. ?Central nervous system: Alert  and oriented. No focal neurological deficits.4/5 right upper extremity strength, 4/5 right lower extremity strength.  Unable to elicit reflexes symmetrically and diffusely.  Sensation intact.  Gait not tested secondary to safety. ?Extremities: Symmetric 5 x 5 power. ?Skin: No rashes, lesions or ulcers ?Psychiatry: Judgement and insight appear normal. Mood & affect appropriate.  ? ? ? ?Data Reviewed:  ? ?CBC: ?Recent Labs  ?Lab 10/13/21 ?2038 10/14/21 ?0342  10/15/21 ?[id number]  ?WBC 8.7 10.3 8.4  ?NEUTROABS 5.4  --   --   ?HGB 10.8* 10.6* 10.8*  ?HCT 35.4* 34.4* 34.4*  ?MCV 84.9 84.9 87.1  ?PLT 256 243 250  ? ? ?Basic Metabolic Panel: ?Recent Labs  ?Lab 10/13/21 ?2038 10/14/21 ?0342 10/15/21 ?[id number] 10/16/21 ?0734  ?NA 133* 135 137 136  ?K 3.9 4.2 3.9 3.7  ?CL 99 99 103 101  ?CO2 23 26 24 24   ?GLUCOSE 155* 142* 136* 146*  ?BUN 30* 31* 21 21  ?CREATININE 1.33* 1.12* 1.02* 1.20*  ?CALCIUM 9.6 9.8 9.3 9.5  ?MG  --   --  1.8  --   ? ? ?GFR: ?Estimated Creatinine Clearance: 34.4 mL/min (A) (by C-G formula based on SCr of 1.2 mg/dL (H)). ? ?Liver Function Tests: ?Recent Labs  ?Lab 10/13/21 ?2038 10/14/21 ?0342  ?AST 19 17  ?ALT 14 14  ?ALKPHOS 55 52  ?BILITOT 0.4 0.6  ?PROT 8.2* 7.7  ?ALBUMIN 3.9 3.7  ? ? ?CBG: ?Recent Labs  ?Lab 10/15/21 ?1605 10/15/21 ?2100 10/16/21 ?0758 10/16/21 ?1112 10/16/21 ?1640  ?GLUCAP 126* 135* 134* 242* 113*  ? ? ? ?Recent Results (from the past 240 hour(s))  ?Resp Panel by RT-PCR (Flu A&B, Covid) Nasopharyngeal Swab     Status: None  ? Collection Time: 10/13/21  8:24 PM  ? Specimen: Nasopharyngeal Swab; Nasopharyngeal(NP) swabs in vial transport medium  ?Result Value Ref Range Status  ? SARS Coronavirus 2 by RT PCR NEGATIVE NEGATIVE Final  ?  Comment: (NOTE) ?SARS-CoV-2 target nucleic acids are NOT DETECTED. ? ?The SARS-CoV-2 RNA is generally detectable in upper respiratory ?specimens during the acute phase of infection. The lowest ?concentration of SARS-CoV-2 viral copies this assay can detect is ?138 copies/mL. A negative result does not preclude SARS-Cov-2 ?infection and should not be used as the sole basis for treatment or ?other patient management decisions. A negative result may occur with  ?improper specimen collection/handling, submission of specimen other ?than nasopharyngeal swab, presence of viral mutation(s) within the ?areas targeted by this assay, and inadequate number of viral ?copies(<138 copies/mL). A negative result must be combined  with ?clinical observations, patient history, and epidemiological ?information. The expected result is Negative. ? ?Fact Sheet for Patients:  ?EntrepreneurPulse.com.au ? ?Fact Sheet for Healthcare Providers:  ?IncredibleEmployment.be ? ?This test is no t yet approved or cleared by the [place] FDA and  ?has been authorized for detection and/or diagnosis of SARS-CoV-2 by ?FDA under an Emergency Use Authorization (EUA). This EUA will remain  ?in effect (meaning this test can be used) for the duration of the ?COVID-19 declaration under Section 564(b)(1) of the Act, 21 ?U.S.C.section 360bbb-3(b)(1), unless the authorization is terminated  ?or revoked sooner.  ? ? ?  ? Influenza A by PCR NEGATIVE NEGATIVE Final  ? Influenza B by PCR NEGATIVE NEGATIVE Final  ?  Comment: (NOTE) ?The Xpert Xpress SARS-CoV-2/FLU/RSV plus assay is intended as an aid ?in the diagnosis of influenza from Nasopharyngeal swab specimens and ?should not be used as a sole basis for treatment. Nasal washings  and ?aspirates are unacceptable for Xpert Xpress SARS-CoV-2/FLU/RSV ?testing. ? ?Fact Sheet for Patients: ?EntrepreneurPulse.com.au ? ?Fact Sheet for Healthcare Providers: ?IncredibleEmployment.be ? ?This test is not yet approved or cleared by the [place] FDA and ?has been authorized for detection and/or diagnosis of SARS-CoV-2 by ?FDA under an Emergency Use Authorization (EUA). This EUA will remain ?in effect (meaning this test can be used) for the duration of the ?COVID-19 declaration under Section 564(b)(1) of the Act, 21 U.S.C. ?section 360bbb-3(b)(1), unless the authorization is terminated or ?revoked. ? ?Performed at [facility], [address] [place], [place] [address] ?  ?  ? ? ? ? ? ?Radiology Studies: ?No results found. ? ? ? ? ? ?Scheduled Meds: ?  stroke: mapping our early stages of recovery book   Does not apply Once  ? apixaban  5 mg Oral BID  ?  atorvastatin  40 mg Oral q1800  ? carvedilol  6.25 mg Oral BID WC  ? cholecalciferol  2,000 Units Oral Daily  ? ferrous sulfate  324 mg Oral Q breakfast  ? fluticasone furoate-vilanterol  1 puff Inhalation Daily  ? And  ? u

## 2021-10-16 NOTE — Plan of Care (Signed)
?  Problem: Education: ?Goal: Knowledge of General Education information will improve ?Description: Including pain rating scale, medication(s)/side effects and non-pharmacologic comfort measures ?Outcome: Progressing ?  ?Problem: Health Behavior/Discharge Planning: ?Goal: Ability to manage health-related needs will improve ?Outcome: Progressing ?  ?Problem: Clinical Measurements: ?Goal: Ability to maintain clinical measurements within normal limits will improve ?Outcome: Progressing ?  ?Problem: Clinical Measurements: ?Goal: Will remain free from infection ?Outcome: Progressing ?  ?Problem: Clinical Measurements: ?Goal: Respiratory complications will improve ?Outcome: Progressing ?  ?Problem: Clinical Measurements: ?Goal: Respiratory complications will improve ?Outcome: Progressing ?  ?Problem: Clinical Measurements: ?Goal: Cardiovascular complication will be avoided ?Outcome: Progressing ?  ?Problem: Activity: ?Goal: Risk for activity intolerance will decrease ?Outcome: Progressing ?  ?Problem: Nutrition: ?Goal: Adequate nutrition will be maintained ?Outcome: Progressing ?  ?Problem: Coping: ?Goal: Level of anxiety will decrease ?Outcome: Progressing ?  ?Problem: Elimination: ?Goal: Will not experience complications related to bowel motility ?Outcome: Progressing ?  ?Problem: Elimination: ?Goal: Will not experience complications related to urinary retention ?Outcome: Progressing ?  ?Problem: Pain Managment: ?Goal: General experience of comfort will improve ?Outcome: Progressing ?  ?

## 2021-10-17 ENCOUNTER — Encounter (HOSPITAL_COMMUNITY): Payer: Self-pay | Admitting: Physical Medicine and Rehabilitation

## 2021-10-17 ENCOUNTER — Inpatient Hospital Stay (HOSPITAL_COMMUNITY)
Admission: RE | Admit: 2021-10-17 | Discharge: 2021-10-25 | DRG: 057 | Disposition: A | Discharge status: Home Health | Payer: Medicare Other | Source: Intra-hospital | Attending: Physical Medicine and Rehabilitation | Admitting: Physical Medicine and Rehabilitation

## 2021-10-17 ENCOUNTER — Other Ambulatory Visit: Payer: Self-pay

## 2021-10-17 DIAGNOSIS — E119 Type 2 diabetes mellitus without complications: Secondary | ICD-10-CM | POA: Diagnosis present

## 2021-10-17 DIAGNOSIS — I63512 Cerebral infarction due to unspecified occlusion or stenosis of left middle cerebral artery: Secondary | ICD-10-CM | POA: Diagnosis not present

## 2021-10-17 DIAGNOSIS — G4700 Insomnia, unspecified: Secondary | ICD-10-CM | POA: Diagnosis present

## 2021-10-17 DIAGNOSIS — I4891 Unspecified atrial fibrillation: Secondary | ICD-10-CM | POA: Diagnosis present

## 2021-10-17 DIAGNOSIS — Z87891 Personal history of nicotine dependence: Secondary | ICD-10-CM | POA: Diagnosis not present

## 2021-10-17 DIAGNOSIS — D509 Iron deficiency anemia, unspecified: Secondary | ICD-10-CM | POA: Diagnosis not present

## 2021-10-17 DIAGNOSIS — K5900 Constipation, unspecified: Secondary | ICD-10-CM | POA: Diagnosis present

## 2021-10-17 DIAGNOSIS — I10 Essential (primary) hypertension: Secondary | ICD-10-CM | POA: Diagnosis not present

## 2021-10-17 DIAGNOSIS — E785 Hyperlipidemia, unspecified: Secondary | ICD-10-CM | POA: Diagnosis present

## 2021-10-17 DIAGNOSIS — I5032 Chronic diastolic (congestive) heart failure: Secondary | ICD-10-CM | POA: Diagnosis not present

## 2021-10-17 DIAGNOSIS — Z7901 Long term (current) use of anticoagulants: Secondary | ICD-10-CM | POA: Diagnosis not present

## 2021-10-17 DIAGNOSIS — Z833 Family history of diabetes mellitus: Secondary | ICD-10-CM

## 2021-10-17 DIAGNOSIS — E1169 Type 2 diabetes mellitus with other specified complication: Secondary | ICD-10-CM | POA: Diagnosis not present

## 2021-10-17 DIAGNOSIS — Z8042 Family history of malignant neoplasm of prostate: Secondary | ICD-10-CM | POA: Diagnosis not present

## 2021-10-17 DIAGNOSIS — I482 Chronic atrial fibrillation, unspecified: Secondary | ICD-10-CM

## 2021-10-17 DIAGNOSIS — Z79899 Other long term (current) drug therapy: Secondary | ICD-10-CM

## 2021-10-17 DIAGNOSIS — I63511 Cerebral infarction due to unspecified occlusion or stenosis of right middle cerebral artery: Secondary | ICD-10-CM | POA: Diagnosis not present

## 2021-10-17 DIAGNOSIS — Z7984 Long term (current) use of oral hypoglycemic drugs: Secondary | ICD-10-CM | POA: Diagnosis not present

## 2021-10-17 DIAGNOSIS — Z66 Do not resuscitate: Secondary | ICD-10-CM | POA: Diagnosis present

## 2021-10-17 DIAGNOSIS — J449 Chronic obstructive pulmonary disease, unspecified: Secondary | ICD-10-CM | POA: Diagnosis present

## 2021-10-17 DIAGNOSIS — D649 Anemia, unspecified: Secondary | ICD-10-CM

## 2021-10-17 DIAGNOSIS — N179 Acute kidney failure, unspecified: Secondary | ICD-10-CM | POA: Diagnosis not present

## 2021-10-17 DIAGNOSIS — I69351 Hemiplegia and hemiparesis following cerebral infarction affecting right dominant side: Secondary | ICD-10-CM | POA: Diagnosis not present

## 2021-10-17 DIAGNOSIS — Z7951 Long term (current) use of inhaled steroids: Secondary | ICD-10-CM | POA: Diagnosis not present

## 2021-10-17 DIAGNOSIS — I110 Hypertensive heart disease with heart failure: Secondary | ICD-10-CM | POA: Diagnosis not present

## 2021-10-17 DIAGNOSIS — E669 Obesity, unspecified: Secondary | ICD-10-CM

## 2021-10-17 DIAGNOSIS — K219 Gastro-esophageal reflux disease without esophagitis: Secondary | ICD-10-CM | POA: Diagnosis present

## 2021-10-17 LAB — GLUCOSE, CAPILLARY
Glucose-Capillary: 136 mg/dL — ABNORMAL HIGH (ref 70–99)
Glucose-Capillary: 218 mg/dL — ABNORMAL HIGH (ref 70–99)
Glucose-Capillary: 175 mg/dL — ABNORMAL HIGH (ref 70–99)
Glucose-Capillary: 144 mg/dL — ABNORMAL HIGH (ref 70–99)

## 2021-10-17 MED ORDER — FLUTICASONE FUROATE-VILANTEROL 100-25 MCG/ACT IN AEPB
1.0000 | INHALATION_SPRAY | Freq: Every day | RESPIRATORY_TRACT | Status: DC
  Filled 2021-10-17: qty 28

## 2021-10-17 MED ORDER — ACETAMINOPHEN 160 MG/5ML PO SOLN: 650.0000 mg | ORAL | Status: DC | PRN

## 2021-10-17 MED ORDER — SPIRONOLACTONE 12.5 MG HALF TABLET
12.5000 mg | ORAL_TABLET | Freq: Every day | ORAL | Status: DC
  Administered 2021-10-18 – 2021-10-21 (×4): 12.5 mg via ORAL
  Filled 2021-10-17 (×2): qty 1
  Filled 2021-10-17 (×2): qty 0.5
  Filled 2021-10-17: qty 1

## 2021-10-17 MED ORDER — UMECLIDINIUM BROMIDE 62.5 MCG/ACT IN AEPB
1.0000 | INHALATION_SPRAY | Freq: Every day | RESPIRATORY_TRACT | Status: DC
Start: 2021-10-17 — End: 2021-10-20
  Administered 2021-10-17 – 2021-10-18 (×2): 1 via RESPIRATORY_TRACT
  Filled 2021-10-17: qty 7

## 2021-10-17 MED ORDER — ATORVASTATIN CALCIUM 40 MG PO TABS: 40.0000 mg | ORAL_TABLET | Freq: Every day | ORAL | 1 refills | Status: DC

## 2021-10-17 MED ORDER — FLUTICASONE FUROATE-VILANTEROL 100-25 MCG/ACT IN AEPB
1.0000 | INHALATION_SPRAY | Freq: Every day | RESPIRATORY_TRACT | Status: DC
  Administered 2021-10-17 – 2021-10-18 (×2): 1 via RESPIRATORY_TRACT
  Filled 2021-10-17: qty 28

## 2021-10-17 MED ORDER — FUROSEMIDE 40 MG PO TABS
40.0000 mg | ORAL_TABLET | Freq: Every day | ORAL | Status: DC
  Administered 2021-10-18 – 2021-10-19 (×2): 40 mg via ORAL
  Filled 2021-10-17 (×2): qty 1

## 2021-10-17 MED ORDER — PANTOPRAZOLE SODIUM 40 MG PO TBEC
40.0000 mg | DELAYED_RELEASE_TABLET | Freq: Every day | ORAL | Status: DC
  Administered 2021-10-18 – 2021-10-25 (×8): 40 mg via ORAL
  Filled 2021-10-17 (×8): qty 1

## 2021-10-17 MED ORDER — ACETAMINOPHEN 650 MG RE SUPP: 650.0000 mg | RECTAL | Status: DC | PRN

## 2021-10-17 MED ORDER — LOSARTAN POTASSIUM 50 MG PO TABS
50.0000 mg | ORAL_TABLET | Freq: Two times a day (BID) | ORAL | Status: DC
  Administered 2021-10-17 – 2021-10-25 (×16): 50 mg via ORAL
  Filled 2021-10-17 (×16): qty 1

## 2021-10-17 MED ORDER — FLUTICASONE FUROATE-VILANTEROL 100-25 MCG/ACT IN AEPB
1.0000 | INHALATION_SPRAY | Freq: Every day | RESPIRATORY_TRACT | Status: DC
Start: 2021-10-17 — End: 2021-10-17
  Filled 2021-10-17: qty 28

## 2021-10-17 MED ORDER — VITAMIN D 25 MCG (1000 UNIT) PO TABS
2000.0000 [IU] | ORAL_TABLET | Freq: Every day | ORAL | Status: DC
  Administered 2021-10-18 – 2021-10-25 (×8): 2000 [IU] via ORAL
  Filled 2021-10-17 (×8): qty 2

## 2021-10-17 MED ORDER — APIXABAN 5 MG PO TABS
5.0000 mg | ORAL_TABLET | Freq: Two times a day (BID) | ORAL | Status: DC
  Administered 2021-10-17 – 2021-10-19 (×4): 5 mg via ORAL
  Filled 2021-10-17 (×4): qty 1

## 2021-10-17 MED ORDER — UMECLIDINIUM BROMIDE 62.5 MCG/ACT IN AEPB
1.0000 | INHALATION_SPRAY | Freq: Every day | RESPIRATORY_TRACT | Status: DC
  Filled 2021-10-17: qty 7

## 2021-10-17 MED ORDER — CARVEDILOL 6.25 MG PO TABS
6.2500 mg | ORAL_TABLET | Freq: Two times a day (BID) | ORAL | Status: DC
  Administered 2021-10-17 – 2021-10-25 (×16): 6.25 mg via ORAL
  Filled 2021-10-17 (×16): qty 1

## 2021-10-17 MED ORDER — MELATONIN 3 MG PO TABS
6.0000 mg | ORAL_TABLET | Freq: Every day | ORAL | Status: DC
  Administered 2021-10-17 – 2021-10-24 (×8): 6 mg via ORAL
  Filled 2021-10-17 (×9): qty 2

## 2021-10-17 MED ORDER — ATORVASTATIN CALCIUM 40 MG PO TABS
40.0000 mg | ORAL_TABLET | Freq: Every day | ORAL | Status: DC
  Administered 2021-10-17 – 2021-10-24 (×8): 40 mg via ORAL
  Filled 2021-10-17 (×8): qty 1

## 2021-10-17 MED ORDER — FERROUS SULFATE 325 (65 FE) MG PO TABS
324.0000 mg | ORAL_TABLET | Freq: Every day | ORAL | Status: DC
  Administered 2021-10-18 – 2021-10-24 (×7): 324 mg via ORAL
  Filled 2021-10-17 (×8): qty 1

## 2021-10-17 MED ORDER — ACETAMINOPHEN 325 MG PO TABS
650.0000 mg | ORAL_TABLET | ORAL | Status: DC | PRN
  Administered 2021-10-23 – 2021-10-24 (×2): 650 mg via ORAL
  Filled 2021-10-17 (×2): qty 2

## 2021-10-17 MED ORDER — INSULIN ASPART 100 UNIT/ML IJ SOLN
0.0000 [IU] | Freq: Three times a day (TID) | INTRAMUSCULAR | Status: DC
  Administered 2021-10-17 – 2021-10-18 (×2): 3 [IU] via SUBCUTANEOUS
  Administered 2021-10-18 (×2): 2 [IU] via SUBCUTANEOUS
  Administered 2021-10-19: 3 [IU] via SUBCUTANEOUS
  Administered 2021-10-19: 2 [IU] via SUBCUTANEOUS
  Administered 2021-10-19: 3 [IU] via SUBCUTANEOUS
  Administered 2021-10-20: 2 [IU] via SUBCUTANEOUS
  Administered 2021-10-20 (×2): 3 [IU] via SUBCUTANEOUS
  Administered 2021-10-21: 2 [IU] via SUBCUTANEOUS
  Administered 2021-10-21 – 2021-10-22 (×2): 3 [IU] via SUBCUTANEOUS
  Administered 2021-10-22: 8 [IU] via SUBCUTANEOUS
  Administered 2021-10-22 – 2021-10-23 (×2): 3 [IU] via SUBCUTANEOUS
  Administered 2021-10-23: 5 [IU] via SUBCUTANEOUS
  Administered 2021-10-23: 2 [IU] via SUBCUTANEOUS
  Administered 2021-10-24: 3 [IU] via SUBCUTANEOUS
  Administered 2021-10-24 (×2): 2 [IU] via SUBCUTANEOUS
  Administered 2021-10-25: 3 [IU] via SUBCUTANEOUS

## 2021-10-17 NOTE — Care Management Important Message (Signed)
Important Message ? ?Patient Details  ?Name: Leslie Kim ?MRN: [id number] ?Date of Birth: 1936-07-17 ? ? ?Medicare Important Message Given:  Yes ? ? ? ? ?Tommy Medal ?10/17/2021, 1:04 PM ?

## 2021-10-17 NOTE — Progress Notes (Signed)
Pt arrived to unit via stretcher. PIV saline locked. Admission questions reviewed with patient. Pt sitting in chair, wheels locked and chair alarm set. Pt alert and oriented. Discussed fall precautions with pt. Pt oriented to unit and instructed to use call bell. Pt verbalized understanding.  ?

## 2021-10-17 NOTE — H&P (Signed)
?  ?Physical Medicine and Rehabilitation Admission H&P ?  ?  ?   ?Chief Complaint  ?Patient presents with  ? Right Sided Weakness  ?: ?HPI: Leslie Kim is a 86 year old female with history of CHF,COPD, hypertension,atrial fibrillation maintained on Eliquis.diabetes mellitus, type 2, quit smoking 13 years ago.  Per chart review patient lives alone.  1 level home one-step to entry.  Independent ambulation without assistive device.  Presented to [facility] 10/14/2021 with acute onset of right-sided weakness as well as some double vision.  CT MRI showed acute small vessel infarct of the left corona radiata.  Patient did not receive tPA.  MRA head and neck plaque in the right greater than left ICA origins with less than 50% stenosis.  Echocardiogram with ejection fraction of 55 to 123456 grade 1 diastolic dysfunction.  Admission chemistries unremarkable except sodium 133, creatinine 1.33, BUN 30, hemoglobin 10.8, urine drug screen negative.  Neurology follow-up patient currently remains on Eliquis as prior to admission.  Tolerating a regular consistency diet.  Therapy evaluations completed due to patient's right side weakness decreased functional mobility was admitted for a comprehensive rehab program. ?  ?  ?Review of Systems  ?Constitutional:  Negative for chills and fever.  ?HENT:  Negative for hearing loss.   ?Eyes:  Negative for blurred vision and double vision.  ?Respiratory:  Positive for shortness of breath. Negative for cough.   ?Cardiovascular:  Positive for palpitations and leg swelling. Negative for chest pain.  ?Gastrointestinal:  Positive for constipation. Negative for heartburn and nausea.  ?     GERD  ?Genitourinary:  Negative for dysuria, flank pain and hematuria.  ?Musculoskeletal:  Positive for joint pain and myalgias.  ?Skin:  Negative for rash.  ?All other systems reviewed and are negative. ?    ?Past Medical History:  ?Diagnosis Date  ? Allergy    ? Cataract    ? CHF (congestive heart  failure) ([place])    ? COPD (chronic obstructive pulmonary disease) ([place])    ? Essential hypertension    ? New onset atrial fibrillation ([place]) 11/2015  ?  a. s/p DCCV on 12/18/2015 b. started on Eliquis  ? Nonischemic cardiomyopathy ([place])    ?  a. 11/2015: echo showing EF of 30-35%, cath with nonobstructive dz  ? Type 2 diabetes mellitus ([place])    ?  ?     ?Past Surgical History:  ?Procedure Laterality Date  ? CARDIAC CATHETERIZATION N/A 12/23/2015  ?  Procedure: Right/Left Heart Cath and Coronary Angiography;  Surgeon: Larey Dresser, MD;  Location: [facility];  Service: Cardiovascular;  Laterality: N/A;  ? cataract surgery      ? CHOLECYSTECTOMY      ?  ?     ?Family History  ?Problem Relation Age of Onset  ? Diabetes Mother    ? Diabetes Father    ? Cancer Father    ? Prostate cancer Father    ? Diabetes Brother    ? Diabetes Brother    ? Diabetes Brother    ? Diabetes Daughter    ? Diabetes Son    ?  ?Social History:  reports that she quit smoking about 13 years ago. Her smoking use included cigarettes. She has a 40.00 pack-year smoking history. She has never used smokeless tobacco. She reports that she does not drink alcohol and does not use drugs. ?Allergies:  ?     ?Allergies  ?Allergen Reactions  ? Codeine  Other (See Comments)  ?    Too strong for patient    ?  ?      ?Medications Prior to Admission  ?Medication Sig Dispense Refill  ? acetaminophen (TYLENOL) 500 MG tablet Take 1,000 mg by mouth every 6 (six) hours as needed. Pain      ? apixaban (ELIQUIS) 5 MG TABS tablet TAKE (1) TABLET BY MOUTH TWICE DAILY. (Patient taking differently: Take 5 mg by mouth 2 (two) times daily.) 60 tablet 11  ? b complex vitamins tablet Take 1 tablet by mouth every other day.      ? carvedilol (COREG) 6.25 MG tablet TAKE 1 TABLET BY MOUTH TWICE DAILY WITH MEAL (Patient taking differently: Take 6.25 mg by mouth 2 (two) times daily with a meal.) 180 tablet 3  ? cholecalciferol (VITAMIN D3) 25 MCG (1000 UNIT) tablet Take  2,000 Units by mouth daily.      ? diphenhydrAMINE (BENADRYL) 25 MG tablet Take 25 mg by mouth every 6 (six) hours as needed for allergies.      ? esomeprazole (NEXIUM) 20 MG capsule Take 20 mg by mouth daily.       ? ferrous sulfate 324 MG TBEC Take 1 tablet (324 mg total) by mouth daily with breakfast. 30 tablet 1  ? furosemide (LASIX) 40 MG tablet TAKE 1 TABLET(40 MG) BY MOUTH DAILY (Patient taking differently: Take 40 mg by mouth daily.) 90 tablet 3  ? losartan (COZAAR) 50 MG tablet TAKE 1 TABLET(50 MG) BY MOUTH TWICE DAILY (Patient taking differently: Take 50 mg by mouth 2 (two) times daily.) 180 tablet 3  ? Melatonin 3 MG TABS Take 2 tablets (6 mg total) by mouth at bedtime. (Patient taking differently: Take 5 mg by mouth at bedtime.) 30 tablet 1  ? PROAIR HFA 108 (90 Base) MCG/ACT inhaler Inhale 2 puffs into the lungs 4 (four) times daily as needed for wheezing or shortness of breath.    12  ? sitaGLIPtin-metformin (JANUMET) 50-1000 MG tablet Take 1 tablet by mouth 2 (two) times daily. 180 tablet 1  ? spironolactone (ALDACTONE) 25 MG tablet TAKE 1/2 TABLET (12.5MG ) BY MOUTH ONCE DAILY. (Patient taking differently: Take 12.5 mg by mouth daily.) 45 tablet 3  ? TRELEGY ELLIPTA 100-62.5-25 MCG/INH AEPB INHALE 1 PUFF INTO THE LUNGS ONCE DAILY. (Patient taking differently: Inhale 1 puff into the lungs every evening.) 60 each 0  ? Multiple Vitamin (MULTIVITAMIN WITH MINERALS) TABS tablet Take 1 tablet by mouth daily. (Patient not taking: Reported on 10/13/2021) 30 tablet 0  ?  ?  ?  ?  ?Home: ?Home Living ?Family/patient expects to be discharged to:: Private residence ?Living Arrangements: Alone ?Available Help at Discharge: Family, Available PRN/intermittently ?Type of Home: House ?Home Access: Stairs to enter ?Entrance Stairs-Number of Steps: 1 ?Entrance Stairs-Rails: None ?Home Layout: Other (Comment), Multi-level (Split level) ?Alternate Level Stairs-Number of Steps: 6 (5 to 6 steps to basement with L rail going  down.) ?Alternate Level Stairs-Rails: Right (going up) ?Bathroom Shower/Tub: Walk-in shower ?Bathroom Toilet: Standard ?Home Equipment: Shower seat, Hand held shower head ?  ?Functional History: ?Prior Function ?Prior Level of Function : Independent/Modified Independent ?Mobility Comments: Independent ambulation without AD. ?ADLs Comments: Uses [employer] delivery for grocerist with family getting groceries some. Independent ADL's and IADL's. ?  ?Functional Status:  ?Mobility: ?Bed Mobility ?Overal bed mobility: Modified Independent ?Bed Mobility: Supine to Sit ?Supine to sit: Modified independent (Device/Increase time) ?Sit to supine: Min assist, Mod assist ?General bed mobility comments:  as per OT notes ?Transfers ?Overall transfer level: Needs assistance ?Equipment used: Rolling walker (2 wheels) ?Transfers: Sit to/from Stand ?Sit to Stand: Supervision ?Bed to/from chair/wheelchair/BSC transfer type:: Stand pivot ?Step pivot transfers: Supervision ?General transfer comment: as per OT notes ?Ambulation/Gait ?Ambulation/Gait assistance: Min guard ?Gait Distance (Feet): 40 Feet ?Assistive device: Rolling walker (2 wheels) ?Gait Pattern/deviations: Decreased step length - right, Decreased step length - left, Decreased stance time - right, Decreased stride length, Knees buckling ?General Gait Details: slow labored movement with difficulty advancing RLE and holding onto RW with right hand, frequent buckling of knees and limited to taking steps at bedside mostly due to fatigue and having limited sleep overnight ?Gait velocity: decreased ?Gait velocity interpretation: <1.31 ft/sec, indicative of household ambulator ?  ?ADL: ?ADL ?Overall ADL's : Needs assistance/impaired ?Eating/Feeding: Set up, Sitting ?Grooming: Moderate assistance, Maximal assistance, Standing ?Upper Body Bathing: Minimal assistance, Sitting ?Lower Body Bathing: Maximal assistance, Bed level ?Upper Body Dressing : Minimal assistance, Sitting ?Lower Body  Dressing: Maximal assistance, Bed level ?Lower Body Dressing Details (indicate cue type and reason): Assited to don socks supine in bed. ?Toilet Transfer: Moderate assistance, Minimal assistance, Stand-pivot,

## 2021-10-17 NOTE — Progress Notes (Signed)
Inpatient Rehab Admissions Coordinator:  ? ?I have a CIR bed for this Pt. Today and will get her transferred over today. RN may call report to [phone]. [facility] set to pick up patient around 1230. ? ?Megan Salon, MS, CCC-SLP ?Rehab Admissions Coordinator  ?[phone] (celll) ?[phone] (office) ? ?

## 2021-10-17 NOTE — Discharge Instructions (Addendum)
Inpatient Rehab Discharge Instructions ? ?Leslie Kim ?Discharge date and time: No discharge date for patient encounter.  ? ?Activities/Precautions/ Functional Status: ?Activity: activity as tolerated ?Diet: diabetic diet ?Wound Care: Routine skin checks ?Functional status:  ?___ No restrictions     ___ Walk up steps independently ?___ 24/7 supervision/assistance   ___ Walk up steps with assistance ?___ Intermittent supervision/assistance  ___ Bathe/dress independently ?___ Walk with walker     __x_ Bathe/dress with assistance ?___ Walk Independently    ___ Shower independently ?___ Walk with assistance    ___ Shower with assistance ?___ No alcohol     ___ Return to work/school ________ ? ?Special Instructions: ? ?No driving smoking or alcohol ? ? ?COMMUNITY REFERRALS UPON DISCHARGE:   ? ?Home Health:   PT OT  & RN ?               Agency: [facility]   Phone: [phone] ? ? ?Medical Equipment/Items Ordered:ROLLING WALKER ?                                                Agency/Supplier:[facility] [phone] ? ? ?My questions have been answered and I understand these instructions. I will adhere to these goals and the provided educational materials after my discharge from the hospital. ? ?Patient/Caregiver Signature _______________________________ Date __________ ? ?Clinician Signature _______________________________________ Date __________ ? ?Please bring this form and your medication list with you to all your follow-up doctor's appointments.  STROKE/TIA DISCHARGE INSTRUCTIONS ?SMOKING Cigarette smoking nearly doubles your risk of having a stroke & is the single most alterable risk factor  ?If you smoke or have smoked in the last 12 months, you are advised to quit smoking for your health. Most of the excess cardiovascular risk related to smoking disappears within a year of stopping. ?Ask you doctor about anti-smoking medications ?[place] Quit Line: 1-800-QUIT NOW ?Free Smoking Cessation Classes [phone]   ?CHOLESTEROL Know your levels; limit fat & cholesterol in your diet  ?Lipid Panel  ?   ?Component Value Date/Time  ? CHOL 231 (H) 10/14/2021 0342  ? TRIG 142 10/14/2021 0342  ? HDL 51 10/14/2021 0342  ? CHOLHDL 4.5 10/14/2021 0342  ? VLDL 28 10/14/2021 0342  ? LDLCALC 152 (H) 10/14/2021 0342  ? ? ? Many patients benefit from treatment even if their cholesterol is at goal. ?Goal: Total Cholesterol (CHOL) less than 160 ?Goal:  Triglycerides (TRIG) less than 150 ?Goal:  HDL greater than 40 ?Goal:  LDL (LDLCALC) less than 100 ?  ?BLOOD PRESSURE American Stroke Association blood pressure target is less that 120/80 mm/Hg  ?Your discharge blood pressure is:  BP: 140/75 Monitor your blood pressure ?Limit your salt and alcohol intake ?Many individuals will require more than one medication for high blood pressure  ?DIABETES (A1c is a blood sugar average for last 3 months) Goal HGBA1c is under 7% (HBGA1c is blood sugar average for last 3 months)  ?Diabetes: ?  ? ?Lab Results  ?Component Value Date  ? HGBA1C 6.3 (H) 10/15/2021  ? ? Your HGBA1c can be lowered with medications, healthy diet, and exercise. ?Check your blood sugar as directed by your physician ?Call your physician if you experience unexplained or low blood sugars.  ?PHYSICAL ACTIVITY/REHABILITATION Goal is 30 minutes at least 4 days per week  ?Activity: Increase activity slowly, ?Therapies: Physical  Therapy: Home Health ?Return to work:  Activity decreases your risk of heart attack and stroke and makes your heart stronger.  It helps control your weight and blood pressure; helps you relax and can improve your mood. ?Participate in a regular exercise program. ?Talk with your doctor about the best form of exercise for you (dancing, walking, swimming, cycling).  ?DIET/WEIGHT Goal is to maintain a healthy weight  ?Your discharge diet is:  ?Diet Order   ? ?       ?  Diet Heart Room service appropriate? Yes; Fluid consistency: Thin  Diet effective now       ?  ? ?  ?   ? ?  ?  liquids ?Your height is:  Height: 5\' 3"  (160 cm) ?Your current weight is:   ?Your Body Mass Index (BMI) is:    Following the type of diet specifically designed for you will help prevent another stroke. ?Your goal weight range is:   ?Your goal Body Mass Index (BMI) is 19-24. ?Healthy food habits can help reduce 3 risk factors for stroke:  High cholesterol, hypertension, and excess weight.  ?RESOURCES Stroke/Support Group:  Call [phone] ?  ?STROKE EDUCATION PROVIDED/REVIEWED AND GIVEN TO PATIENT Stroke warning signs and symptoms ?How to activate emergency medical system (call 911). ?Medications prescribed at discharge. ?Need for follow-up after discharge. ?Personal risk factors for stroke. ?Pneumonia vaccine given: No ?Flu vaccine given: No ?My questions have been answered, the writing is legible, and I understand these instructions.  I will adhere to these goals & educational materials that have been provided to me after my discharge from the hospital.  ? ?  ?

## 2021-10-17 NOTE — H&P (Signed)
? ? ?Physical Medicine and Rehabilitation Admission H&P ? ?  ?Chief Complaint  ?Patient presents with  ? Right Sided Weakness  ?: ?HPI: Leslie Kim is a 86 year old female with history of CHF,COPD, hypertension,atrial fibrillation maintained on Eliquis.diabetes mellitus, type 2, quit smoking 13 years ago.  Per chart review patient lives alone.  1 level home one-step to entry.  Independent ambulation without assistive device.  Presented to [facility]/14/2021 with acute onset of right-sided weakness as well as some double vision.  CT MRI showed acute small vessel infarct of the left corona radiata.  Patient did not receive tPA.  MRA head and neck plaque in the right greater than left ICA origins with less than 50% stenosis.  Echocardiogram with ejection fraction of 55 to 123456 grade 1 diastolic dysfunction.  Admission chemistries unremarkable except sodium 133, creatinine 1.33, BUN 30, hemoglobin 10.8, urine drug screen negative.  Neurology follow-up patient currently remains on Eliquis as prior to admission.  Tolerating a regular consistency diet.  Therapy evaluations completed due to patient's right side weakness decreased functional mobility was admitted for a comprehensive rehab program. ? ? ?Review of Systems  ?Constitutional:  Negative for chills and fever.  ?HENT:  Negative for hearing loss.   ?Eyes:  Negative for blurred vision and double vision.  ?Respiratory:  Positive for shortness of breath. Negative for cough.   ?Cardiovascular:  Positive for palpitations and leg swelling. Negative for chest pain.  ?Gastrointestinal:  Positive for constipation. Negative for heartburn and nausea.  ?     GERD  ?Genitourinary:  Negative for dysuria, flank pain and hematuria.  ?Musculoskeletal:  Positive for joint pain and myalgias.  ?Skin:  Negative for rash.  ?All other systems reviewed and are negative. ?Past Medical History:  ?Diagnosis Date  ? Allergy   ? Cataract   ? CHF (congestive heart failure) ([place])   ?  COPD (chronic obstructive pulmonary disease) ([place])   ? Essential hypertension   ? New onset atrial fibrillation ([place]) 11/2015  ? a. s/p DCCV on 12/18/2015 b. started on Eliquis  ? Nonischemic cardiomyopathy ([place])   ? a. 11/2015: echo showing EF of 30-35%, cath with nonobstructive dz  ? Type 2 diabetes mellitus ([place])   ? ?Past Surgical History:  ?Procedure Laterality Date  ? CARDIAC CATHETERIZATION N/A 12/23/2015  ? Procedure: Right/Left Heart Cath and Coronary Angiography;  Surgeon: Larey Dresser, MD;  Location: [facility];  Service: Cardiovascular;  Laterality: N/A;  ? cataract surgery    ? CHOLECYSTECTOMY    ? ?Family History  ?Problem Relation Age of Onset  ? Diabetes Mother   ? Diabetes Father   ? Cancer Father   ? Prostate cancer Father   ? Diabetes Brother   ? Diabetes Brother   ? Diabetes Brother   ? Diabetes Daughter   ? Diabetes Son   ? ?Social History:  reports that she quit smoking about 13 years ago. Her smoking use included cigarettes. She has a 40.00 pack-year smoking history. She has never used smokeless tobacco. She reports that she does not drink alcohol and does not use drugs. ?Allergies:  ?Allergies  ?Allergen Reactions  ? Codeine Other (See Comments)  ?  Too strong for patient    ? ?Medications Prior to Admission  ?Medication Sig Dispense Refill  ? acetaminophen (TYLENOL) 500 MG tablet Take 1,000 mg by mouth every 6 (six) hours as needed. Pain    ? apixaban (ELIQUIS) 5 MG TABS  tablet TAKE (1) TABLET BY MOUTH TWICE DAILY. (Patient taking differently: Take 5 mg by mouth 2 (two) times daily.) 60 tablet 11  ? b complex vitamins tablet Take 1 tablet by mouth every other day.    ? carvedilol (COREG) 6.25 MG tablet TAKE 1 TABLET BY MOUTH TWICE DAILY WITH MEAL (Patient taking differently: Take 6.25 mg by mouth 2 (two) times daily with a meal.) 180 tablet 3  ? cholecalciferol (VITAMIN D3) 25 MCG (1000 UNIT) tablet Take 2,000 Units by mouth daily.    ? diphenhydrAMINE (BENADRYL) 25 MG tablet Take  25 mg by mouth every 6 (six) hours as needed for allergies.    ? esomeprazole (NEXIUM) 20 MG capsule Take 20 mg by mouth daily.     ? ferrous sulfate 324 MG TBEC Take 1 tablet (324 mg total) by mouth daily with breakfast. 30 tablet 1  ? furosemide (LASIX) 40 MG tablet TAKE 1 TABLET(40 MG) BY MOUTH DAILY (Patient taking differently: Take 40 mg by mouth daily.) 90 tablet 3  ? losartan (COZAAR) 50 MG tablet TAKE 1 TABLET(50 MG) BY MOUTH TWICE DAILY (Patient taking differently: Take 50 mg by mouth 2 (two) times daily.) 180 tablet 3  ? Melatonin 3 MG TABS Take 2 tablets (6 mg total) by mouth at bedtime. (Patient taking differently: Take 5 mg by mouth at bedtime.) 30 tablet 1  ? PROAIR HFA 108 (90 Base) MCG/ACT inhaler Inhale 2 puffs into the lungs 4 (four) times daily as needed for wheezing or shortness of breath.   12  ? sitaGLIPtin-metformin (JANUMET) 50-1000 MG tablet Take 1 tablet by mouth 2 (two) times daily. 180 tablet 1  ? spironolactone (ALDACTONE) 25 MG tablet TAKE 1/2 TABLET (12.5MG ) BY MOUTH ONCE DAILY. (Patient taking differently: Take 12.5 mg by mouth daily.) 45 tablet 3  ? TRELEGY ELLIPTA 100-62.5-25 MCG/INH AEPB INHALE 1 PUFF INTO THE LUNGS ONCE DAILY. (Patient taking differently: Inhale 1 puff into the lungs every evening.) 60 each 0  ? Multiple Vitamin (MULTIVITAMIN WITH MINERALS) TABS tablet Take 1 tablet by mouth daily. (Patient not taking: Reported on 10/13/2021) 30 tablet 0  ? ? ? ? ?Home: ?Home Living ?Family/patient expects to be discharged to:: Private residence ?Living Arrangements: Alone ?Available Help at Discharge: Family, Available PRN/intermittently ?Type of Home: House ?Home Access: Stairs to enter ?Entrance Stairs-Number of Steps: 1 ?Entrance Stairs-Rails: None ?Home Layout: Other (Comment), Multi-level (Split level) ?Alternate Level Stairs-Number of Steps: 6 (5 to 6 steps to basement with L rail going down.) ?Alternate Level Stairs-Rails: Right (going up) ?Bathroom Shower/Tub: Walk-in  shower ?Bathroom Toilet: Standard ?Home Equipment: Shower seat, Hand held shower head ?  ?Functional History: ?Prior Function ?Prior Level of Function : Independent/Modified Independent ?Mobility Comments: Independent ambulation without AD. ?ADLs Comments: Uses [employer] delivery for grocerist with family getting groceries some. Independent ADL's and IADL's. ? ?Functional Status:  ?Mobility: ?Bed Mobility ?Overal bed mobility: Modified Independent ?Bed Mobility: Supine to Sit ?Supine to sit: Modified independent (Device/Increase time) ?Sit to supine: Min assist, Mod assist ?General bed mobility comments: as per OT notes ?Transfers ?Overall transfer level: Needs assistance ?Equipment used: Rolling walker (2 wheels) ?Transfers: Sit to/from Stand ?Sit to Stand: Supervision ?Bed to/from chair/wheelchair/BSC transfer type:: Stand pivot ?Step pivot transfers: Supervision ?General transfer comment: as per OT notes ?Ambulation/Gait ?Ambulation/Gait assistance: Min guard ?Gait Distance (Feet): 40 Feet ?Assistive device: Rolling walker (2 wheels) ?Gait Pattern/deviations: Decreased step length - right, Decreased step length - left, Decreased stance time - right, Decreased  stride length, Knees buckling ?General Gait Details: slow labored movement with difficulty advancing RLE and holding onto RW with right hand, frequent buckling of knees and limited to taking steps at bedside mostly due to fatigue and having limited sleep overnight ?Gait velocity: decreased ?Gait velocity interpretation: <1.31 ft/sec, indicative of household ambulator ?  ? ?ADL: ?ADL ?Overall ADL's : Needs assistance/impaired ?Eating/Feeding: Set up, Sitting ?Grooming: Moderate assistance, Maximal assistance, Standing ?Upper Body Bathing: Minimal assistance, Sitting ?Lower Body Bathing: Maximal assistance, Bed level ?Upper Body Dressing : Minimal assistance, Sitting ?Lower Body Dressing: Maximal assistance, Bed level ?Lower Body Dressing Details (indicate cue  type and reason): Assited to don socks supine in bed. ?Toilet Transfer: Moderate assistance, Minimal assistance, Stand-pivot, Rolling walker (2 wheels) ?Toilet Transfer Details (indicate cue type and reason

## 2021-10-17 NOTE — Progress Notes (Signed)
Physical Therapy Treatment ?Patient Details ?Name: Leslie Kim ?MRN: [id number] ?DOB: Jun 25, 1936 ?Today's Date: 10/17/2021 ? ? ?History of Present Illness Leslie Kim is a 86 y.o. female with medical history significant of CHF, COPD, hypertension, atrial fibrillation, diabetes mellitus type 2, presents the ER with a chief complaint of right arm and leg weakness.  Patient reports that Tuesday night she noticed numbness in her right hand and right leg.  She noticed it when she could not use the dexterity of her right hand to order things from the store on her phone.  Patient is right-hand dominant.  Patient reports no falls, no trouble with vision, speaking, drooling, hearing at that time.  She reports no numbness in her arm or leg.  Her legs do jump sometimes.  She reports the weakness in her legs is worse when she is trying to walk.  Patient has no other complaints at this time.  Review of Systems: As mentioned in the history of present illness. All other systems reviewed and are negative. (taken per MD note) ? ?  ?PT Comments  ? ? Pt able to complete most tasks today with increased time/supervision for safely. Pt deferred walking outside room, however did not appear fatigue or distressed following gait inside room.  Pt returned seated in recliner and completed LE therex with cues.  Pt will continue to benefit from skilled therapy to obtain goals and functional independence.   ?Recommendations for follow up therapy are one component of a multi-disciplinary discharge planning process, led by the attending physician.  Recommendations may be updated based on patient status, additional functional criteria and insurance authorization. ? ?Follow Up Recommendations ? Acute inpatient rehab (3hours/day) ?  ?  ?Assistance Recommended at Discharge Set up Supervision/Assistance  ?Patient can return home with the following A lot of help with walking and/or transfers;A lot of help with bathing/dressing/bathroom;Assistance with  cooking/housework;Assist for transportation;Help with stairs or ramp for entrance ?  ?Equipment Recommendations ? Rolling walker (2 wheels)  ?  ?Recommendations for Other Services   ? ? ?  ?Precautions / Restrictions    ?  ? ?Mobility ? Bed Mobility ?Overal bed mobility: Modified Independent ?Bed Mobility: Supine to Sit ?  ?  ?Supine to sit: Modified independent (Device/Increase time) ?  ?  ?  ?  ? ?Transfers ?Overall transfer level: Needs assistance ?Equipment used: Rolling walker (2 wheels) ?Transfers: Sit to/from Stand ?Sit to Stand: Supervision ?  ?Step pivot transfers: Supervision ?  ?  ?  ?  ?  ? ?Ambulation/Gait ?Ambulation/Gait assistance: Min guard ?Gait Distance (Feet): 40 Feet ?Assistive device: Rolling walker (2 wheels) ?  ?  ?Gait velocity interpretation: <1.31 ft/sec, indicative of household ambulator ?  ?  ? ? ? ?  ?   ?   ?Exercises General Exercises - Lower Extremity ?Ankle Circles/Pumps: Both, 10 reps ?Long Arc Quad: Both, 10 reps ?Hip Flexion/Marching: Both, 10 reps ?Heel Raises: Both, 10 reps ? ?  ? ? ?PT Goals (current goals can now be found in the care plan section) Acute Rehab PT Goals ?Patient Stated Goal: return home after rehab ?PT Goal Formulation: With patient/family ?Time For Goal Achievement: 10/28/21 ?Potential to Achieve Goals: Good ? ?  ?Frequency ? ? ? Min 4X/week4x week ? ? ? ?  ?PT Plan  Continue progression towards goals  ? ? ?   ?AM-PAC PT "6 Clicks" Mobility   ?Outcome Measure ? Help needed turning from your back to your side while in a flat  bed without using bedrails?: A Little ?Help needed moving from lying on your back to sitting on the side of a flat bed without using bedrails?: A Little ?Help needed moving to and from a bed to a chair (including a wheelchair)?: A Lot ?Help needed standing up from a chair using your arms (e.g., wheelchair or bedside chair)?: A Lot ?Help needed to walk in hospital room?: A Lot ?Help needed climbing 3-5 steps with a railing? : A Lot ?6 Click  Score: 14 ? ?  ?End of Session   ?Activity Tolerance: Patient tolerated treatment well;Patient limited by fatigue ?Patient left: in bed;with call bell/phone within reach ?Nurse Communication: Mobility status ?PT Visit Diagnosis: Unsteadiness on feet (R26.81);Other abnormalities of gait and mobility (R26.89);Muscle weakness (generalized) (M62.81) ?  ? ? ?Time: 5573-2202 ?PT Time Calculation (min) (ACUTE ONLY): 24 min ? ?Charges:  $Gait Training: 8-22 mins ?$Therapeutic Exercise: 8-22 mins          ?          ?Amalya Salmons Kae Heller, PTA/CLT, WTA ?[phone] ? ? ? ?Emeline Gins B ?10/17/2021, 11:57 AM ? ?

## 2021-10-17 NOTE — TOC Transition Note (Signed)
Transition of Care (TOC) - CM/SW Discharge Note ? ? ?Patient Details  ?Name: Leslie Kim ?MRN: [id number] ?Date of Birth: [birth year] ? ?Transition of Care (TOC) CM/SW Contact:  ?Elliot Gault, LCSW ?Phone Number: ?10/17/2021, 11:20 AM ? ? ?Clinical Narrative:    ? ?Pt stable for dc to CIR today and there is a bed available. [facility] will transport. There are no other TOC needs for dc. ? ?Final next level of care: IP Rehab Facility ?Barriers to Discharge: Barriers Resolved ? ? ?Patient Goals and CMS Choice ?Patient states their goals for this hospitalization and ongoing recovery are:: to get better. ?  ?Choice offered to / list presented to : Adult Children ? ?Discharge Placement ?  ?           ?  ?  ?  ?  ? ?Discharge Plan and Services ?  ?  ?           ?  ?  ?  ?  ?  ?  ?  ?  ?  ?  ? ?Social Determinants of Health (SDOH) Interventions ?  ? ? ?Readmission Risk Interventions ?Readmission Risk Prevention Plan 10/15/2021  ?Transportation Screening Complete  ?Home Care Screening Complete  ?Medication Review (RN CM) Complete  ?Some recent data might be hidden  ? ? ? ? ? ?

## 2021-10-17 NOTE — Discharge Summary (Signed)
Physician Discharge Summary  ?NOELE CIACCIA [id number] DOB: [birth year] DOA: 10/13/2021 ? ?PCP: Celene Squibb, MD ? ?Admit date: 10/13/2021 ?Discharge date: 10/17/2021 ? ?Time spent: 55 minutes ? ?Recommendations for Outpatient Follow-up:  ?Follow-up with Dr. Leonie Man, neurology in 4 weeks. ?Follow-up with Celene Squibb, MD in 2 weeks.  On follow-up patient need a basic metabolic profile done to follow-up on electrolytes and renal function.  Patient blood pressure will need to be followed up upon.  Risk factor modification. ?Patient will be discharged to inpatient rehab at [facility]. ? ? ?Discharge Diagnoses:  ?Principal Problem: ?  CVA (cerebral vascular accident) ([place]) ?Active Problems: ?  HTN (hypertension) ?  Diabetes mellitus ([place]) ?  Atrial fibrillation ([place]) ?  Gastroesophageal reflux disease without esophagitis ?  Essential hypertension ?  Ischemic stroke ([place]) ? ? ?Discharge Condition: Stable ? ?Diet recommendation: Heart healthy ? ?Filed Weights  ? 10/13/21 1954  ?Weight: 74.8 kg  ? ? ?History of present illness:  ?Per Dr.Zierle-Ghosh ?ARCHIE BRANCACCIO is a 86 y.o. female with medical history significant of CHF, COPD, hypertension, atrial fibrillation, diabetes mellitus type 2, presents the ER with a chief complaint of right arm and leg weakness.  Patient reports that Tuesday night she noticed numbness in her right hand and right leg.  She noticed it when she could not use the dexterity of her right hand to order things from the store on her phone.  Patient is right-hand dominant.  Patient reports no falls, no trouble with vision, speaking, drooling, hearing at that time.  She reports no numbness in her arm or leg.  Her legs do jump sometimes.  She reports the weakness in her legs is worse when she is trying to walk.  Patient has no other complaints at this time. ?Hospital Course:  ? ?Assessment and Plan: ?* CVA (cerebral vascular accident) ([place]) ?-Patient presented with right arm and leg weakness. ?-CT  head done on admission negative. ?-MRI brain done with acute small vessel infarct of the left corona radiator.  Mild to moderate chronic microvascular ischemic changes. ?-MRA head and neck done with plaque on the right > left ICA origins with less than 50% stenosis.  No LVO. ?-Patient noted to be out of the window for tPA and also out of window for permissive hypertension. ?-2D echo with normal EF, no source of emboli noted, no PFO. ?-Hemoglobin A1c noted at 6.5. ?-Fasting lipid panel of 152.  Goal LDL < 70. ?-Patient started on Lipitor 40 mg daily. ?-Patient maintained on home regimen Eliquis. ?-Patient seen in consultation by neurology. ?-PT/OT assessed patient and recommended CIR. ?-Patient will be discharged to CIR. ?-Patient will need outpatient follow-up with neurology 3 to 4 weeks postdischarge. ? ?Ischemic stroke ([place]) ?See above CVA ? ?Gastroesophageal reflux disease without esophagitis ?-Patient maintained on PPI.   ? ?Atrial fibrillation ([place]) ?- Patient maintained on Coreg for rate control.   ?-Eliquis for anticoagulation.   ? ?Diabetes mellitus ([place]) ?- Well-controlled.   ?-Hemoglobin A1c 6.3 -6.5.   ?-Patient's oral hypoglycemic agents were held during the hospitalization and patient maintained on sliding scale insulin.  -Oral hypoglycemic medications will be resumed on discharge. ? ?HTN (hypertension) ?- Patient was maintained on home regimen Coreg, losartan, spironolactone, Lasix.  ?-Outpatient follow-up with PCP. ? ? ? ? ?  ? ?Procedures: ?2D echo 10/14/2021 ?CT head 10/13/2021 ?MRI brain 10/14/2021 ?MRA head and neck 10/14/2021 ? ?Consultations: ?Neurology: Dr.Bhagat 10/14/2021 ?Telemetry neurology: Dr. Thayer Ohm 10/13/2021 ?  ? ?Discharge  Exam: ?Vitals:  ? 10/16/21 2238 10/17/21 0423  ?BP: (!) 129/58 (!) 134/51  ?Pulse: 82 82  ?Resp: 18 18  ?Temp: 98.2 ?F (36.8 ?C) 98.5 ?F (36.9 ?C)  ?SpO2: 95% 94%  ? ? ?General: NAD ?Cardiovascular: Regular rate rhythm no murmurs rubs or gallops.  No JVD.  No lower  extremity edema. ?Respiratory: Clear to auscultation bilaterally.  No wheezes, no crackles, no rhonchi. ? ?Discharge Instructions ? ? ?Discharge Instructions   ? ? Diet - low sodium heart healthy   Complete by: As directed ?  ? Increase activity slowly   Complete by: As directed ?  ? ?  ? ?Allergies as of 10/17/2021   ? ?   Reactions  ? Codeine Other (See Comments)  ? Too strong for patient    ? ?  ? ?  ?Medication List  ?  ? ?STOP taking these medications   ? ?multivitamin with minerals Tabs tablet ?  ? ?  ? ?TAKE these medications   ? ?acetaminophen 500 MG tablet ?Commonly known as: TYLENOL ?Take 1,000 mg by mouth every 6 (six) hours as needed. Pain ?  ?atorvastatin 40 MG tablet ?Commonly known as: LIPITOR ?Take 1 tablet (40 mg total) by mouth daily at 6 PM. ?  ?b complex vitamins tablet ?Take 1 tablet by mouth every other day. ?  ?carvedilol 6.25 MG tablet ?Commonly known as: COREG ?TAKE 1 TABLET BY MOUTH TWICE DAILY WITH MEAL ?What changed: See the new instructions. ?  ?cholecalciferol 25 MCG (1000 UNIT) tablet ?Commonly known as: VITAMIN D3 ?Take 2,000 Units by mouth daily. ?  ?diphenhydrAMINE 25 MG tablet ?Commonly known as: BENADRYL ?Take 25 mg by mouth every 6 (six) hours as needed for allergies. ?  ?Eliquis 5 MG Tabs tablet ?Generic drug: apixaban ?TAKE (1) TABLET BY MOUTH TWICE DAILY. ?What changed:  ?how much to take ?how to take this ?when to take this ?additional instructions ?  ?esomeprazole 20 MG capsule ?Commonly known as: [place] ?Take 20 mg by mouth daily. ?  ?ferrous sulfate 324 MG Tbec ?Take 1 tablet (324 mg total) by mouth daily with breakfast. ?  ?furosemide 40 MG tablet ?Commonly known as: LASIX ?TAKE 1 TABLET(40 MG) BY MOUTH DAILY ?What changed:  ?how much to take ?how to take this ?when to take this ?additional instructions ?  ?losartan 50 MG tablet ?Commonly known as: COZAAR ?TAKE 1 TABLET(50 MG) BY MOUTH TWICE DAILY ?What changed: See the new instructions. ?  ?melatonin 3 MG Tabs  tablet ?Take 2 tablets (6 mg total) by mouth at bedtime. ?What changed: how much to take ?  ?ProAir HFA 108 (90 Base) MCG/ACT inhaler ?Generic drug: albuterol ?Inhale 2 puffs into the lungs 4 (four) times daily as needed for wheezing or shortness of breath. ?  ?sitaGLIPtin-metformin 50-1000 MG tablet ?Commonly known as: JANUMET ?Take 1 tablet by mouth 2 (two) times daily. ?  ?spironolactone 25 MG tablet ?Commonly known as: ALDACTONE ?TAKE 1/2 TABLET (12.5MG ) BY MOUTH ONCE DAILY. ?What changed:  ?how much to take ?how to take this ?when to take this ?additional instructions ?  ?Trelegy Ellipta 100-62.5-25 MCG/ACT Aepb ?Generic drug: Fluticasone-Umeclidin-Vilant ?INHALE 1 PUFF INTO THE LUNGS ONCE DAILY. ?What changed: See the new instructions. ?  ? ?  ? ?  ?  ? ? ?  ?Durable Medical Equipment  ?(From admission, onward)  ?  ? ? ?  ? ?  Start     Ordered  ? 10/15/21 0809  For home use  only DME 4 wheeled rolling walker with seat  Once       ?Question:  Patient needs a walker to treat with the following condition  Answer:  Stroke [facility])  ? 10/15/21 0808  ? ?  ?  ? ?  ? ?Allergies  ?Allergen Reactions  ? Codeine Other (See Comments)  ?  Too strong for patient    ? ? Follow-up Information   ? ? Garvin Fila, MD. Schedule an appointment as soon as possible for a visit in 4 week(s).   ?Specialties: Neurology, Radiology ?Contact information: ?[address] ?[address] ?[place] [address] ?[phone] ? ? ?  ?  ? ? Celene Squibb, MD. Schedule an appointment as soon as possible for a visit in 2 week(s).   ?Specialty: Internal Medicine ?Contact information: ?[address] ?[address] ?[place] [address] ?[phone] ? ? ?  ?  ? ? Satira Sark, MD .   ?Specialty: Cardiology ?Contact information: ?[address] ?[place] [address] ?[phone] ? ? ?  ?  ? ?  ?  ? ?  ? ? ? ?The results of significant diagnostics from this hospitalization (including imaging, microbiology, ancillary and laboratory) are listed below for  reference.   ? ?Significant Diagnostic Studies: ?CT HEAD WO CONTRAST ? ?Result Date: 10/13/2021 ?CLINICAL DATA:  Neurologic deficit.  Concern for stroke. EXAM: CT HEAD WITHOUT CONTRAST TECHNIQUE: Contiguous axial images

## 2021-10-17 NOTE — Progress Notes (Signed)
PMR Admission Coordinator Pre-Admission Assessment ?  ?Patient: Leslie Kim is an 86 y.o., female ?MRN: [id number] ?DOB: [birth year] ?Height: 5' 3"  (160 cm) ?Weight: 74.8 kg ?  ?Insurance Information ?HMO:     PPO: yes      PCP:      IPA:      80/20:      OTHER:  ?PRIMARY: U[employer] Medicare      Policy#: [id number]      Subscriber: Pt. ?CM Name:       Phone#: [phone]     Fax#: [phone] ?Pre-Cert#: [id number]      Employer:  I received a call from [place] 3/17 with approval 3/18 -3/24 with updates due 3/24 ?Benefits:  Phone #:      Name:  ?Irene Shipper Date: 08/31/2021 - still active ?Deductible: $0 (does not have deductible) ?OOP Max: $3,900 ($0 met)  ?CIR: $295/day co-pay for days 1-5, $0/day co-pay for days 6+  ?SNF: $0.00 Copayment per day for days 1-20; $196.00 Copayment per day for days 21-40; $0.00 Copayment per day for days 41-100 for Medicare-covered care/maximum 100 days/benefit period  ?Outpatient: $20/visit co-pay  ?Home Health:  100% coverage, 0% co-insurance; limited by medical necessity  ?DME: 80% coverage; 20% co-insurance  ?Providers: In network ? ?SECONDARY:       Policy#:      Phone#:  ?  ?  ?The ?Data Collection Information Summary? for patients in Inpatient Rehabilitation Facilities with attached ?Privacy Act [place] Records? was provided and verbally reviewed with: Family  ?  ?Emergency Contact Information ?Contact Information   ?  ?  Name Relation Home Work Mobile  ?  Moore,Carole Daughter     [phone]  ?  Caponi,Richard Spouse [phone]      ?  Chudney, Scheffler [phone]      ?  Shakora, Nordquist Son [phone]      ?  ?   ?  ?  ?Current Medical History  ?Patient Admitting Diagnosis: CVA ?History of Present Illness: Leslie Kim is a 86 y.o. female with medical history significant of CHF, COPD, hypertension, atrial fibrillation, diabetes mellitus type 2, presented to  the [facility]  ER 10/14/21 with a chief complaint of right arm and leg weakness. MRI angio of the  brain showed Acute small vessel infarct of the left corona radiata, mild to moderate chronic microvascular ischemic changes, and laque at the right greater than left ICA origins with less than 50% ?stenosis. PT/OT saw Pt. And recommended CIR to assist return to PLOF.  ?Complete NIHSS TOTAL: 1 ?  ?Patient's medical record from [facility] has been reviewed by the rehabilitation admission coordinator and physician. ?  ?Past Medical History  ?    ?Past Medical History:  ?Diagnosis Date  ? Allergy    ? Cataract    ? CHF (congestive heart failure) ([place])    ? COPD (chronic obstructive pulmonary disease) ([place])    ? Essential hypertension    ? New onset atrial fibrillation ([place]) 11/2015  ?  a. s/p DCCV on 12/18/2015 b. started on Eliquis  ? Nonischemic cardiomyopathy ([place])    ?  a. 11/2015: echo showing EF of 30-35%, cath with nonobstructive dz  ? Type 2 diabetes mellitus ([place])    ?  ?  ?Has the patient had major surgery during 100 days prior to admission? No ?  ?Family History   ?family history includes Cancer in her father; Diabetes in her brother, brother, brother, daughter, father, mother,  and son; Prostate cancer in her father. ?  ?Current Medications ?  ?Current Facility-Administered Medications:  ?   stroke: mapping our early stages of recovery book, , Does not apply, Once, Zierle-Ghosh, [place] B, DO ?  acetaminophen (TYLENOL) tablet 650 mg, 650 mg, Oral, Q4H PRN, 650 mg at 10/14/21 1908 **OR** acetaminophen (TYLENOL) 160 MG/5ML solution 650 mg, 650 mg, Per Tube, Q4H PRN **OR** acetaminophen (TYLENOL) suppository 650 mg, 650 mg, Rectal, Q4H PRN, Zierle-Ghosh, Asia B, DO ?  apixaban (ELIQUIS) tablet 5 mg, 5 mg, Oral, BID, Bhagat, Srishti L, MD, 5 mg at 10/16/21 0737 ?  atorvastatin (LIPITOR) tablet 40 mg, 40 mg, Oral, q1800, Eugenie Filler, MD, 40 mg at 10/15/21 1629 ?  carvedilol (COREG) tablet 6.25 mg, 6.25 mg, Oral, BID WC, Zierle-Ghosh, Asia B, DO, 6.25 mg at 10/16/21 1062 ?  cholecalciferol (VITAMIN D3)  tablet 2,000 Units, 2,000 Units, Oral, Daily, Eugenie Filler, MD, 2,000 Units at 10/16/21 6948 ?  ferrous sulfate tablet 324 mg, 324 mg, Oral, Q breakfast, Eugenie Filler, MD, 324 mg at 10/16/21 5462 ?  fluticasone furoate-vilanterol (BREO ELLIPTA) 100-25 MCG/ACT 1 puff, 1 puff, Inhalation, Daily, 1 puff at 10/15/21 2137 **AND** umeclidinium bromide (INCRUSE ELLIPTA) 62.5 MCG/ACT 1 puff, 1 puff, Inhalation, Daily, Eugenie Filler, MD, 1 puff at 10/15/21 2137 ?  furosemide (LASIX) tablet 40 mg, 40 mg, Oral, Daily, Eugenie Filler, MD, 40 mg at 10/16/21 7035 ?  hydrALAZINE (APRESOLINE) injection 5 mg, 5 mg, Intravenous, Q6H PRN, Eugenie Filler, MD ?  insulin aspart (novoLOG) injection 0-15 Units, 0-15 Units, Subcutaneous, TID WC, Zierle-Ghosh, Asia B, DO, 2 Units at 10/16/21 0949 ?  insulin aspart (novoLOG) injection 0-5 Units, 0-5 Units, Subcutaneous, QHS, Zierle-Ghosh, Asia B, DO ?  losartan (COZAAR) tablet 50 mg, 50 mg, Oral, BID, Eugenie Filler, MD, 50 mg at 10/16/21 0093 ?  melatonin tablet 6 mg, 6 mg, Oral, QHS, Zierle-Ghosh, Asia B, DO, 6 mg at 10/15/21 2233 ?  pantoprazole (PROTONIX) EC tablet 40 mg, 40 mg, Oral, Daily, Zierle-Ghosh, Asia B, DO, 40 mg at 10/16/21 8182 ?  prochlorperazine (COMPAZINE) injection 5 mg, 5 mg, Intravenous, Q6H PRN, Eugenie Filler, MD ?  spironolactone (ALDACTONE) tablet 25 mg, 25 mg, Oral, Daily, Zierle-Ghosh, Asia B, DO, 25 mg at 10/16/21 9937 ?  ?Patients Current Diet:  ?Diet Order   ?  ?         ?    Diet Heart Room service appropriate? Yes; Fluid consistency: Thin  Diet effective now       ?  ?  ?   ?  ?  ?   ?  ?  ?Precautions / Restrictions ?Precautions ?Precautions: Fall ?Restrictions ?Weight Bearing Restrictions: No  ?  ?Has the patient had 2 or more falls or a fall with injury in the past year? No ?  ?Prior Activity Level ? Pt. Was active in the community PTA ?  ?Prior Functional Level ?Self Care: Did the patient need help bathing, dressing, using  the toilet or eating? Independent ?  ?Indoor Mobility: Did the patient need assistance with walking from room to room (with or without device)? Independent ?  ?Stairs: Did the patient need assistance with internal or external stairs (with or without device)? Independent ?  ?Functional Cognition: Did the patient need help planning regular tasks such as shopping or remembering to take medications? Independent ?  ?Patient Information ?Are you of Hispanic, Latino/a,or Spanish origin?: A. No, not of  Hispanic, Latino/a, or Spanish origin ?What is your race?: A. White ?Do you need or want an interpreter to communicate with a doctor or health care staff?: 0. No ?  ?Patient's Response To:  ?Health Literacy and Transportation ?Is the patient able to respond to health literacy and transportation needs?: Yes ?Health Literacy - How often do you need to have someone help you when you read instructions, pamphlets, or other written material from your doctor or pharmacy?: Never ?In the past 12 months, has lack of transportation kept you from medical appointments or from getting medications?: No ?In the past 12 months, has lack of transportation kept you from meetings, work, or from getting things needed for daily living?: No ?  ?Home Assistive Devices / Equipment ?Home Assistive Devices/Equipment: None ?Home Equipment: Shower seat, Hand held shower head ?  ?Prior Device Use: Indicate devices/aids used by the patient prior to current illness, exacerbation or injury? None of the above ?  ?Current Functional Level ?Cognition ?  Overall Cognitive Status: Within Functional Limits for tasks assessed ?Orientation Level: Oriented X4 ?General Comments: Pt somewhat lethargic with reports of not sleeping well the past few days. ?   ?Extremity Assessment ?(includes Sensation/Coordination) ?  Upper Extremity Assessment: Defer to OT evaluation ?RUE Deficits / Details: 2+/5 shoulder flexion and abduction, 3+/5 elbow flexion and extension, 3+/5  grip. WFL P/ROM. ?RUE Sensation:  (not tested; numbness reported initial MD report.) ?RUE Coordination: decreased fine motor, decreased gross motor ?LUE Deficits / Details: Gnerally weak. ?LUE Coordination:

## 2021-10-17 NOTE — Progress Notes (Signed)
[facility] rehab was given report on patient  ?

## 2021-10-18 ENCOUNTER — Encounter (HOSPITAL_COMMUNITY): Payer: Self-pay | Admitting: Physical Medicine and Rehabilitation

## 2021-10-18 LAB — CBC WITH DIFFERENTIAL/PLATELET
Abs Immature Granulocytes: 0.03 10*3/uL (ref 0.00–0.07)
Basophils Absolute: 0 10*3/uL (ref 0.0–0.1)
Basophils Relative: 0 %
Eosinophils Absolute: 0.1 10*3/uL (ref 0.0–0.5)
Eosinophils Relative: 1 %
HCT: 34 % — ABNORMAL LOW (ref 36.0–46.0)
Hemoglobin: 10.9 g/dL — ABNORMAL LOW (ref 12.0–15.0)
Immature Granulocytes: 0 %
Lymphocytes Relative: 28 %
Lymphs Abs: 2.9 10*3/uL (ref 0.7–4.0)
MCH: 26.7 pg (ref 26.0–34.0)
MCHC: 32.1 g/dL (ref 30.0–36.0)
MCV: 83.3 fL (ref 80.0–100.0)
Monocytes Absolute: 0.8 10*3/uL (ref 0.1–1.0)
Monocytes Relative: 8 %
Neutro Abs: 6.4 10*3/uL (ref 1.7–7.7)
Neutrophils Relative %: 63 %
Platelets: 224 10*3/uL (ref 150–400)
RBC: 4.08 MIL/uL (ref 3.87–5.11)
RDW: 15 % (ref 11.5–15.5)
WBC: 10.3 10*3/uL (ref 4.0–10.5)
nRBC: 0 % (ref 0.0–0.2)

## 2021-10-18 LAB — COMPREHENSIVE METABOLIC PANEL
ALT: 18 U/L (ref 0–44)
AST: 19 U/L (ref 15–41)
Albumin: 3.5 g/dL (ref 3.5–5.0)
Alkaline Phosphatase: 56 U/L (ref 38–126)
Anion gap: 10 (ref 5–15)
BUN: 27 mg/dL — ABNORMAL HIGH (ref 8–23)
CO2: 22 mmol/L (ref 22–32)
Calcium: 9.4 mg/dL (ref 8.9–10.3)
Chloride: 101 mmol/L (ref 98–111)
Creatinine, Ser: 1.45 mg/dL — ABNORMAL HIGH (ref 0.44–1.00)
GFR, Estimated: 36 mL/min — ABNORMAL LOW (ref >60)
Glucose, Bld: 165 mg/dL — ABNORMAL HIGH (ref 70–99)
Potassium: 3.8 mmol/L (ref 3.5–5.1)
Sodium: 133 mmol/L — ABNORMAL LOW (ref 135–145)
Total Bilirubin: 0.7 mg/dL (ref 0.3–1.2)
Total Protein: 7.1 g/dL (ref 6.5–8.1)

## 2021-10-18 LAB — GLUCOSE, CAPILLARY
Glucose-Capillary: 162 mg/dL — ABNORMAL HIGH (ref 70–99)
Glucose-Capillary: 144 mg/dL — ABNORMAL HIGH (ref 70–99)
Glucose-Capillary: 130 mg/dL — ABNORMAL HIGH (ref 70–99)
Glucose-Capillary: 162 mg/dL — ABNORMAL HIGH (ref 70–99)

## 2021-10-18 MED ORDER — MAGNESIUM GLUCONATE 500 MG PO TABS
250.0000 mg | ORAL_TABLET | Freq: Every day | ORAL | Status: DC
  Administered 2021-10-18 – 2021-10-24 (×7): 250 mg via ORAL
  Filled 2021-10-18 (×7): qty 1

## 2021-10-18 MED ORDER — POTASSIUM CHLORIDE 20 MEQ PO PACK
20.0000 meq | PACK | Freq: Once | ORAL | Status: AC
Start: 2021-10-18 — End: 2021-10-18
  Administered 2021-10-18: 20 meq via ORAL
  Filled 2021-10-18: qty 1

## 2021-10-18 MED ORDER — SENNOSIDES-DOCUSATE SODIUM 8.6-50 MG PO TABS
1.0000 | ORAL_TABLET | Freq: Two times a day (BID) | ORAL | Status: DC
  Administered 2021-10-18 – 2021-10-25 (×10): 1 via ORAL
  Filled 2021-10-18 (×14): qty 1

## 2021-10-18 NOTE — Progress Notes (Signed)
?                                                       PROGRESS NOTE ? ? ?Subjective/Complaints: ?No new complaints this morning ?Slept well last night but woke early this morning on her own ?Did not have a BM yesterday and usually has one every day ? ?ROS: +constipation ? ? ?Objective: ?  ?No results found. ?Recent Labs  ?  10/18/21 ?[id number]  ?WBC 10.3  ?HGB 10.9*  ?HCT 34.0*  ?PLT 224  ? ?Recent Labs  ?  10/16/21 ?0734 10/18/21 ?[id number]  ?NA 136 133*  ?K 3.7 3.8  ?CL 101 101  ?CO2 24 22  ?GLUCOSE 146* 165*  ?BUN 21 27*  ?CREATININE 1.20* 1.45*  ?CALCIUM 9.5 9.4  ? ? ?Intake/Output Summary (Last 24 hours) at 10/18/2021 1037 ?Last data filed at 10/18/2021 0730 ?Gross per 24 hour  ?Intake 240 ml  ?Output --  ?Net 240 ml  ?  ? ?  ? ?Physical Exam: ?Vital Signs ?Blood pressure (!) 148/66, pulse 83, temperature 97.7 ?F (36.5 ?C), temperature source Oral, resp. rate 16, height 5\' 3"  (1.6 m), weight 72.4 kg, SpO2 95 %. ?Gen: no distress, normal appearing ?HEENT: oral mucosa pink and moist, NCAT ?Cardio: Reg rate ?Chest: normal effort, normal rate of breathing ?Abd: soft, non-distended ?Ext: no edema ?Psych: pleasant, normal affect ?Skin: intact ?Neurological:  ?   Mental Status: She is alert.  ?   Comments: Alert and oriented x 3. Normal insight and awareness. Intact Memory. Normal language and speech. Cranial nerve exam unremarkable except for mild right central 7. RUE 4-/5 prox to distal with +PD and decreased [place] with FTN. RLE 4 to 4+/5 prox to distal. LUE and LLE 5/5. Sensory exam normal for light touch and pain in all 4 limbs. No limb ataxia or cerebellar signs. No abnormal tone appreciated.    ?  ? ? ?Assessment/Plan: ?1. Functional deficits which require 3+ hours per day of interdisciplinary therapy in a comprehensive inpatient rehab setting. ?Physiatrist is providing close team supervision and 24 hour management of active medical problems listed below. ?Physiatrist and rehab team continue to assess barriers to  discharge/monitor patient progress toward functional and medical goals ? ?Care Tool: ? ?Bathing ?   ?   ?   ?  ?  ?Bathing assist   ?  ?  ?Upper Body Dressing/Undressing ?Upper body dressing   ?  ?   ?Upper body assist   ?   ?Lower Body Dressing/Undressing ?Lower body dressing ? ? ?   ?  ? ?  ? ?Lower body assist   ?   ? ?Toileting ?Toileting    ?Toileting assist Assist for toileting: Contact Guard/Touching assist ?  ?  ?Transfers ?Chair/bed transfer ? ?Transfers assist ?   ? ?Chair/bed transfer assist level: Contact Guard/Touching assist ?  ?  ?Locomotion ?Ambulation ? ? ?Ambulation assist ? ?   ? ?Assist level: Contact Guard/Touching assist ?Assistive device: Walker-rolling ?Max distance: 175ft  ? ?Walk 10 feet activity ? ? ?Assist ?   ? ?Assist level: Contact Guard/Touching assist ?Assistive device: Walker-rolling  ? ?Walk 50 feet activity ? ? ?Assist   ? ?Assist level: Contact Guard/Touching assist ?Assistive device: Walker-rolling  ? ? ?Walk 150 feet activity ? ? ?Assist   ? ?Assist  level: Contact Guard/Touching assist ?Assistive device: Walker-rolling ?  ? ?Walk 10 feet on uneven surface  ?activity ? ? ?Assist   ? ? ?Assist level: Minimal Assistance - Patient > 75% ?Assistive device: Walker-rolling  ? ?Wheelchair ? ? ? ? ?Assist Is the patient using a wheelchair?: No ?  ?  ? ?  ?   ? ? ?Wheelchair 50 feet with 2 turns activity ? ? ? ?Assist ? ?  ?  ? ? ?   ? ?Wheelchair 150 feet activity  ? ? ? ?Assist ?   ? ? ?   ? ?Blood pressure (!) 148/66, pulse 83, temperature 97.7 ?F (36.5 ?C), temperature source Oral, resp. rate 16, height 5\' 3"  (1.6 m), weight 72.4 kg, SpO2 95 %. ? ?Medical Problem List and Plan: ?1. Functional deficits secondary to left corona radiata infarction with right HP. ?            -patient may shower ?            -ELOS/Goals: 9-12 days, supervision to [place] with PT, OT, SLP ? Messaged Lattie Haw to schedule hospital follow-up ?2.  Antithrombotics: ?-DVT/anticoagulation:  Pharmaceutical: Other  (comment) ELIQUIS ?            -antiplatelet therapy: N/A ?3. Pain Management: Tylenol as needed ?4. Mood: Melatonin 6 mg nightly ?            -antipsychotic agents: N/A ?5. Neuropsych: This patient is capable of making decisions on her own behalf. ?6. Skin/Wound Care: Routine skin checks ?7. Fluids/Electrolytes/Nutrition: Routine in and out with follow-up chemistries ?8.  Diastolic congestive heart failure.  Lasix 40 mg daily.  Monitor for signs of fluid overload ?            -check weights daily ?9.  Hyperlipidemia.  Lipitor ?10.  Atrial fibrillation.  Continue Eliquis.  Cardiac rate controlled ?11.  Hypertension.  Coreg 6.25 mg twice daily, Cozaar 50 mg twice daily, Aldactone 25 mg daily.    ?            -monitor for control with increased physical activity.  ?12.  Diabetes mellitus type 2.  Hemoglobin A1c 6.3.   ?-Currently with SSI. Adjust regimen as needed. ?13.  COPD.  Quit smoking 13 years ago.  Continue inhalers as directed. ?14.  GERD.  Protonix ?15. Iron deficiency anemia: continue iron supplement ?16. Suboptimal B12: check levels tomorrow.  ?17. AKI: repeat creatinine tomorrow ? ?LOS: ?1 days ?A FACE TO FACE EVALUATION WAS PERFORMED ? ?Martha Clan P Citlalic Norlander ?10/18/2021, 10:37 AM  ? ?  ?

## 2021-10-18 NOTE — Plan of Care (Signed)
?  Problem: RH Balance ?Goal: LTG Patient will maintain dynamic standing balance (PT) ?Description: LTG:  Patient will maintain dynamic standing balance with assistance during mobility activities (PT) ?Flowsheets (Taken 10/18/2021 1230) ?LTG: Pt will maintain dynamic standing balance during mobility activities with:: Supervision/Verbal cueing ?  ?Problem: Sit to Stand ?Goal: LTG:  Patient will perform sit to stand with assistance level (PT) ?Description: LTG:  Patient will perform sit to stand with assistance level (PT) ?Flowsheets (Taken 10/18/2021 1230) ?LTG: PT will perform sit to stand in preparation for functional mobility with assistance level: Independent with assistive device ?  ?Problem: RH Bed Mobility ?Goal: LTG Patient will perform bed mobility with assist (PT) ?Description: LTG: Patient will perform bed mobility with assistance, with/without cues (PT). ?Flowsheets (Taken 10/18/2021 1230) ?LTG: Pt will perform bed mobility with assistance level of: Independent ?  ?Problem: RH Bed to Chair Transfers ?Goal: LTG Patient will perform bed/chair transfers w/assist (PT) ?Description: LTG: Patient will perform bed to chair transfers with assistance (PT). ?Flowsheets (Taken 10/18/2021 1230) ?LTG: Pt will perform Bed to Chair Transfers with assistance level: Independent with assistive device  ?  ?Problem: RH Car Transfers ?Goal: LTG Patient will perform car transfers with assist (PT) ?Description: LTG: Patient will perform car transfers with assistance (PT). ?Flowsheets (Taken 10/18/2021 1230) ?LTG: Pt will perform car transfers with assist:: Supervision/Verbal cueing ?  ?Problem: RH Ambulation ?Goal: LTG Patient will ambulate in controlled environment (PT) ?Description: LTG: Patient will ambulate in a controlled environment, # of feet with assistance (PT). ?Flowsheets (Taken 10/18/2021 1230) ?LTG: Pt will ambulate in controlled environ  assist needed:: Supervision/Verbal cueing ?LTG: Ambulation distance in controlled  environment: 137ft ?Goal: LTG Patient will ambulate in home environment (PT) ?Description: LTG: Patient will ambulate in home environment, # of feet with assistance (PT). ?Flowsheets (Taken 10/18/2021 1230) ?LTG: Pt will ambulate in home environ  assist needed:: Supervision/Verbal cueing ?LTG: Ambulation distance in home environment: 92ft ?  ?Problem: RH Stairs ?Goal: LTG Patient will ambulate up and down stairs w/assist (PT) ?Description: LTG: Patient will ambulate up and down # of stairs with assistance (PT) ?Flowsheets (Taken 10/18/2021 1230) ?LTG: Pt will ambulate up/down stairs assist needed:: Supervision/Verbal cueing ?LTG: Pt will  ambulate up and down number of stairs: 6 or as per home setup ?  ?

## 2021-10-18 NOTE — Progress Notes (Signed)
Inpatient Rehabilitation  Patient information reviewed and entered into eRehab system by Trafton Roker M. Charisma Charlot, M.A., CCC/SLP, PPS Coordinator.  Information including medical coding, functional ability and quality indicators will be reviewed and updated through discharge.    

## 2021-10-18 NOTE — Progress Notes (Signed)
Inpatient Rehabilitation Center ?Individual Statement of Services ? ?Patient Name:  Leslie Kim  ?Date:  10/18/2021 ? ?Welcome to the Inpatient Rehabilitation Center.  Our goal is to provide you with an individualized program based on your diagnosis and situation, designed to meet your specific needs.  With this comprehensive rehabilitation program, you will be expected to participate in at least 3 hours of rehabilitation therapies Monday-Friday, with modified therapy programming on the weekends. ? ?Your rehabilitation program will include the following services:  Physical Therapy (PT), Occupational Therapy (OT), Speech Therapy (ST), 24 hour per day rehabilitation nursing, Care Coordinator, Rehabilitation Medicine, Nutrition Services, and Pharmacy Services ? ?Weekly team conferences will be held on Wednesday to discuss your progress.  Your Inpatient Rehabilitation Care Coordinator will talk with you frequently to get your input and to update you on team discussions.  Team conferences with you and your family in attendance may also be held. ? ?Expected length of stay: 7-9 days  Overall anticipated outcome: supervision-mod/I level ? ?Depending on your progress and recovery, your program may change. Your Inpatient Rehabilitation Care Coordinator will coordinate services and will keep you informed of any changes. Your Inpatient Rehabilitation Care Coordinator's name and contact numbers are listed  below. ? ?The following services may also be recommended but are not provided by the Inpatient Rehabilitation Center:  ?Driving Evaluations ?Home Health Rehabiltiation Services ?Outpatient Rehabilitation Services ? ?  ?Arrangements will be made to provide these services after discharge if needed.  Arrangements include referral to agencies that provide these services. ? ?Your insurance has been verified to be:  [employer]-Medicare ?Your primary doctor is:  Nita Sells ? ?Pertinent information will be shared with your doctor and your  insurance company. ? ?Inpatient Rehabilitation Care Coordinator:  Dossie Der, LCSW [phone] or (C) [phone] ? ?Information discussed with and copy given to patient by: Lucy Chris, 10/18/2021, 11:24 AM    ?

## 2021-10-18 NOTE — Plan of Care (Signed)
?  Problem: Consults ?Goal: RH STROKE PATIENT EDUCATION ?Description: See Patient Education module for education specifics  ?Outcome: Progressing ?  ?Problem: RH BOWEL ELIMINATION ?Goal: RH STG MANAGE BOWEL WITH ASSISTANCE ?Description: STG Manage Bowel with mod I Assistance. ?Outcome: Progressing ?Goal: RH STG MANAGE BOWEL W/MEDICATION W/ASSISTANCE ?Description: STG Manage Bowel with Medication with mod I Assistance. ?Outcome: Progressing ?  ?Problem: RH SAFETY ?Goal: RH STG ADHERE TO SAFETY PRECAUTIONS W/ASSISTANCE/DEVICE ?Description: STG Adhere to Safety Precautions With cues Assistance/Device. ?Outcome: Progressing ?  ?Problem: RH PAIN MANAGEMENT ?Goal: RH STG PAIN MANAGED AT OR BELOW PT'S PAIN GOAL ?Description: At or below level 4 with prns ?Outcome: Progressing ?  ?Problem: RH KNOWLEDGE DEFICIT ?Goal: RH STG INCREASE KNOWLEDGE OF DIABETES ?Description: Patient and family will be able to manage DM with medications and dietary modifications using handouts and educational resources independently ?Outcome: Progressing ?Goal: RH STG INCREASE KNOWLEDGE OF HYPERTENSION ?Description: Patient and family will be able to manage HTN with medications and dietary modifications using handouts and educational resources independently ?Outcome: Progressing ?Goal: RH STG INCREASE KNOWLEGDE OF HYPERLIPIDEMIA ?Description: Patient and family will be able to manage HLD with medications and dietary modifications using handouts and educational resources independently ?Outcome: Progressing ?Goal: RH STG INCREASE KNOWLEDGE OF STROKE PROPHYLAXIS ?Description: Patient and family will be able to manage secondary stroke risks with medications and dietary modifications using handouts and educational resources independently ?Outcome: Progressing ?  ?

## 2021-10-18 NOTE — Plan of Care (Addendum)
?  Problem: RH Grooming ?Goal: LTG Patient will perform grooming w/assist,cues/equip (OT) ?Description: LTG: Patient will perform grooming with assist, with/without cues using equipment (OT) ?Flowsheets (Taken 10/18/2021 1356) ?LTG: Pt will perform grooming with assistance level of: Independent ?  ?Problem: RH Dressing ?Goal: LTG Patient will perform upper body dressing (OT) ?Description: LTG Patient will perform upper body dressing with assist, with/without cues (OT). ?Flowsheets (Taken 10/18/2021 1356) ?LTG: Pt will perform upper body dressing with assistance level of: Independent ?Goal: LTG Patient will perform lower body dressing w/assist (OT) ?Description: LTG: Patient will perform lower body dressing with assist, with/without cues in positioning using equipment (OT) ?Flowsheets (Taken 10/18/2021 1356) ?LTG: Pt will perform lower body dressing with assistance level of: Independent ?  ?Problem: RH Toileting ?Goal: LTG Patient will perform toileting task (3/3 steps) with assistance level (OT) ?Description: LTG: Patient will perform toileting task (3/3 steps) with assistance level (OT)  ?Flowsheets (Taken 10/18/2021 1356) ?LTG: Pt will perform toileting task (3/3 steps) with assistance level: Independent with assistive device ?  ?Problem: RH Toilet Transfers ?Goal: LTG Patient will perform toilet transfers w/assist (OT) ?Description: LTG: Patient will perform toilet transfers with assist, with/without cues using equipment (OT) ?Flowsheets (Taken 10/18/2021 1356) ?LTG: Pt will perform toilet transfers with assistance level of: Independent with assistive device ?  ?Problem: RH Tub/Shower Transfers ?Goal: LTG Patient will perform tub/shower transfers w/assist (OT) ?Description: LTG: Patient will perform tub/shower transfers with assist, with/without cues using equipment (OT) ?Flowsheets (Taken 10/18/2021 1356) ?LTG: Pt will perform tub/shower stall transfers with assistance level of: Supervision/Verbal cueing ? ?Problem: RH  Functional Use of Upper Extremity ?Goal: LTG Patient will use RT/LT upper extremity as a (OT) ?Description: LTG: Patient will use right/left upper extremity as a stabilizer/gross assist/diminished/nondominant/dominant level with assist, with/without cues during functional activity (OT) ?Flowsheets (Taken 10/18/2021 1358) ?LTG: Use of upper extremity in functional activities: RUE as dominant level ?LTG: Pt will use upper extremity in functional activity with assistance level of: Supervision/Verbal cueing ?  ?Problem: RH Simple Meal Prep ?Goal: LTG Patient will perform simple meal prep w/assist (OT) ?Description: LTG: Patient will perform simple meal prep with assistance, with/without cues (OT). ?Flowsheets (Taken 10/18/2021 1358) ?LTG: Pt will perform simple meal prep with assistance level of: Supervision/Verbal cueing ?  ? ?  ?  ?

## 2021-10-18 NOTE — Progress Notes (Signed)
Inpatient Rehabilitation Care Coordinator ?Assessment and Plan ?Patient Details  ?Name: Leslie Kim ?MRN: [id number] ?Date of Birth: September 26, 1935 ? ?Today's Date: 10/18/2021 ? ?Hospital Problems: Principal Problem: ?  Left middle cerebral artery stroke ([place]) ? ?Past Medical History:  ?Past Medical History:  ?Diagnosis Date  ? Allergy   ? Cataract   ? CHF (congestive heart failure) ([place])   ? COPD (chronic obstructive pulmonary disease) ([place])   ? Essential hypertension   ? New onset atrial fibrillation ([place]) 11/2015  ? a. s/p DCCV on 12/18/2015 b. started on Eliquis  ? Nonischemic cardiomyopathy ([place])   ? a. 11/2015: echo showing EF of 30-35%, cath with nonobstructive dz  ? Type 2 diabetes mellitus ([place])   ? ?Past Surgical History:  ?Past Surgical History:  ?Procedure Laterality Date  ? CARDIAC CATHETERIZATION N/A 12/23/2015  ? Procedure: Right/Left Heart Cath and Coronary Angiography;  Surgeon: Larey Dresser, MD;  Location: [facility];  Service: Cardiovascular;  Laterality: N/A;  ? cataract surgery    ? CHOLECYSTECTOMY    ? ?Social History:  reports that she quit smoking about 13 years ago. Her smoking use included cigarettes. She has a 40.00 pack-year smoking history. She has never used smokeless tobacco. She reports that she does not drink alcohol and does not use drugs. ? ?Family / Support Systems ?Marital Status: Widow/Widower ?How Long?: one week ago ?Patient Roles: Parent ?Children: Dan Humphreys will be moving in with pt at [place], [place]  [place] [phone] ?Other Supports: Church members and friends ?Anticipated Caregiver: Dan Humphreys to move in with pt at [place] ?Ability/Limitations of Caregiver: Jeneen Rinks has recent surgery on his hand due to crushing injury ?Caregiver Availability: 24/7 ?Family Dynamics: Close knit family who are very involved three are four boys and one girl and extended family. Pt was the caregiver for her husband who recently passed away from complicaitons  of dementia of which she was the caregiver ? ?Social History ?Preferred language: English ?Religion: Baptist ?Cultural Background: no issues ?Education: HS ?Health Literacy - How often do you need to have someone help you when you read instructions, pamphlets, or other written material from your doctor or pharmacy?: Never ?Writes: Yes ?Employment Status: Retired ?Legal History/Current Legal Issues: No issues ?Guardian/Conservator: None-according to MD pt is capable of making her own decisions while here, her family will be here daily and to support her  ? ?Abuse/Neglect ?Abuse/Neglect Assessment Can Be Completed: Yes ?Physical Abuse: Denies ?Verbal Abuse: Denies ?Sexual Abuse: Denies ?Exploitation of patient/patient's resources: Denies ?Self-Neglect: Denies ? ?Patient response to: ?Social Isolation - How often do you feel lonely or isolated from those around you?: Never ? ?Emotional Status ?Pt's affect, behavior and adjustment status: Pt is motivated and encouraged by the progress she has made already her right hand is coming back and she hopes it will al come back. She has always been independent and hopes to be again. She does not feel the stress of taking care of her husband had any impact on her health. ?Recent Psychosocial Issues: other health issues-recent passing of her husband ?Psychiatric History: No history seems to be coping appropriately with her husband's passing he had been ill for a few years and had gotten worse recnetly ?Substance Abuse History: No issues-remote smoker quit age 25 ? ?Patient / Family Perceptions, Expectations & Goals ?Pt/Family understanding of illness & functional limitations: Pt and son can explain her stroke and is feeling positive regarding her recovery she is getting movement back. Both she  and family do talk with the MD and feel they have a good understanding of her treatment plan going forward. 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 ?Community Resources ?Community Agencies: None ?Premorbid Home Care/DME Agencies: Other (Comment) (shower seat) ?Transportation available at discharge: Pt drove his children can assist with this ?Is the patient able to respond to transportation needs?: Yes ?In the past 12 months, has lack of transportation kept you from medical appointments or from getting medications?: No ?In the past 12 months, has lack of transportation kept you from meetings, work, or from getting things needed for daily living?: No ? ?Discharge Planning ?Living Arrangements: Alone (husband recently passed last week-son to move in with her) ?Support Systems: Children, Other relatives, Friends/neighbors, Church/faith community ?Type of Residence: Private residence ?Insurance Resources: Multimedia programmer (specify) ([employer]-Medicare) ?Financial Resources: Social Security ?Financial Screen Referred: No ?Living Expenses: Own ?Money Management: Patient ?Does the patient have any problems obtaining your medications?: No ?Home Management: self ?Patient/Family Preliminary Plans: Return home with son-James who is moving in with her so someone will be there with her at discharge and for safety, now that her husband has passed. Aware being evaluated today for goals and length stay. Pt is motivated to do well here ?Care Coordinator Barriers to Discharge: Insurance for SNF coverage, Other (comments) ?Care Coordinator Barriers to Discharge Comments: recent loss of her husband unsure when the funeral is ?Care Coordinator Anticipated Follow Up Needs: HH/OP ? ?Clinical Impression ?Pleasant female who is motivated to recover from this stroke and is encouraged by the progress she has already made since this has happened. She recently lost her husband  last week of whom she was the caregiver for and seems to be coping appropriately. Has a strong faith to get her through. Her family is supportive and involved and will make sure she has what she needs at discharge. Await team evaluations. ? ?Elease Hashimoto ?10/18/2021, 9:15 AM ? ?  ?

## 2021-10-18 NOTE — Evaluation (Signed)
Physical Therapy Assessment and Plan ? ?Patient Details  ?Name: Leslie Kim ?MRN: [id number] ?Date of Birth: November 27, 1935 ? ?PT Diagnosis: Abnormal posture, Abnormality of gait, Difficulty walking, Hemiparesis dominant, and Muscle weakness ?Rehab Potential: Excellent ?ELOS: 7-9 days  ? ?Today's Date: 10/18/2021 ?PT Individual Time: [id number] ?PT Individual Time Calculation (min): 75 min   ? ?Hospital Problem: Principal Problem: ?  Left middle cerebral artery stroke ([place]) ? ? ?Past Medical History:  ?Past Medical History:  ?Diagnosis Date  ? Allergy   ? Cataract   ? CHF (congestive heart failure) ([place])   ? COPD (chronic obstructive pulmonary disease) ([place])   ? Essential hypertension   ? New onset atrial fibrillation ([place]) 11/2015  ? a. s/p DCCV on 12/18/2015 b. started on Eliquis  ? Nonischemic cardiomyopathy ([place])   ? a. 11/2015: echo showing EF of 30-35%, cath with nonobstructive dz  ? Type 2 diabetes mellitus ([place])   ? ?Past Surgical History:  ?Past Surgical History:  ?Procedure Laterality Date  ? CARDIAC CATHETERIZATION N/A 12/23/2015  ? Procedure: Right/Left Heart Cath and Coronary Angiography;  Surgeon: Larey Dresser, MD;  Location: [facility];  Service: Cardiovascular;  Laterality: N/A;  ? cataract surgery    ? CHOLECYSTECTOMY    ? ? ?Assessment & Plan ?Clinical Impression: Patient is a 86 year old female with history of CHF,COPD, hypertension,atrial fibrillation maintained on Eliquis.diabetes mellitus, type 2, quit smoking 13 years ago.  Per chart review patient lives alone.  1 level home one-step to entry.  Independent ambulation without assistive device.  Presented to [facility]/14/2021 with acute onset of right-sided weakness as well as some double vision.  CT MRI showed acute small vessel infarct of the left corona radiata.  Patient did not receive tPA.  MRA head and neck plaque in the right greater than left ICA origins with less than 50% stenosis.  Echocardiogram with ejection fraction  of 55 to 123456 grade 1 diastolic dysfunction.  Admission chemistries unremarkable except sodium 133, creatinine 1.33, BUN 30, hemoglobin 10.8, urine drug screen negative.  Neurology follow-up patient currently remains on Eliquis as prior to admission.  Tolerating a regular consistency diet.  Therapy evaluations completed due to patient's right side weakness decreased functional mobility was admitted for a comprehensive rehab program.Patient transferred to CIR on 10/17/2021 .  ? ?Patient currently requires  CGA  with mobility secondary to muscle weakness, decreased cardiorespiratoy endurance, and decreased standing balance and hemiplegia.  Prior to hospitalization, patient was independent  with mobility and lived with Alone, Other (Comment) (Family members live nearby) in a House home.  Home access is 1Stairs to enter. ? ?Patient will benefit from skilled PT intervention to maximize safe functional mobility, minimize fall risk, and decrease caregiver burden for planned discharge home with 24 hour supervision.  Anticipate patient will benefit from follow up [place] at discharge. ? ?PT - End of Session ?Activity Tolerance: Tolerates 10 - 20 min activity with multiple rests ?Endurance Deficit: Yes ?Endurance Deficit Description: seated rest breaks during functional mobility tasks ?PT Assessment ?Rehab Potential (ACUTE/IP ONLY): Excellent ?PT Barriers to Discharge: Home environment access/layout;Decreased caregiver support;Lack of/limited family support;Insurance for SNF coverage ?PT Patient demonstrates impairments in the following area(s): Balance;Endurance;Motor ?PT Transfers Functional Problem(s): Bed Mobility;Bed to Chair;Car ?PT Locomotion Functional Problem(s): Ambulation;Stairs ?PT Plan ?PT Intensity: Minimum of 1-2 x/day ,45 to 90 minutes ?PT Frequency: 5 out of 7 days ?PT Duration Estimated Length of Stay: 7-9 days ?PT Treatment/Interventions: Ambulation/gait training;Cognitive remediation/compensation;Discharge  planning;DME/adaptive equipment instruction;Functional mobility training;Pain management;Psychosocial support;Splinting/orthotics;Therapeutic Activities;UE/LE Strength taining/ROM;Visual/perceptual remediation/compensation;Wheelchair Biochemist, clinical;Therapeutic Exercise;Patient/family education;Functional electrical stimulation;Neuromuscular re-education;Disease management/prevention;Community reintegration;Balance/vestibular training ?PT Transfers Anticipated Outcome(s): mod I with LRAD ?PT Locomotion Anticipated Outcome(s): supervision with LRAD ?PT Recommendation ?Follow Up Recommendations: Home health PT;24 hour supervision/assistance ?Patient destination: Home ?Equipment Recommended: To be determined ? ? ?PT Evaluation ?Precautions/Restrictions ?Precautions ?Precautions: Fall ?Precaution Comments: mild R hemi ?Restrictions ?Weight Bearing Restrictions: No ?Pain ?Pain Assessment ?Pain Scale: 0-10 ?Pain Score: 0-No pain ?Pain Interference ?Pain Interference ?Pain Effect on Sleep: 0. Does not apply - I have not had any pain or hurting in the past 5 days ?Pain Interference with Therapy Activities: 0. Does not apply - I have not received rehabilitationtherapy in the past 5 days ?Pain Interference with Day-to-Day Activities: 1. Rarely or not at all ?Home Living/Prior Functioning ?Home Living ?Living Arrangements: Alone (husband recently passed last week-son to move in with her) ?Available Help at Discharge: Family;Available PRN/intermittently (son moving in with her to provide 24/7 supervision) ?Type of Home: House ?Home Access: Stairs to enter ?Entrance Stairs-Number of Steps: 1 ?Entrance Stairs-Rails: None ?Home Layout: Other (Comment);Multi-level (split level) ?Alternate Level Stairs-Number of Steps: 6 (rail on R going up) ?Alternate Level Stairs-Rails: Right ?Bathroom Shower/Tub: Walk-in shower ?Bathroom Toilet: Standard ? Lives With: Alone;Other (Comment)  (Family members live nearby) ?Prior Function ?Level of Independence: Independent with gait;Independent with transfers;Other (comment) (grandson would assist with vaccumming) ? Able to Take Stairs?: Yes ?Driving: Yes ?Vocation: Retired ?Vision/Perception  ?Vision - History ?Ability to See in Adequate Light: 0 Adequate ?Perception ?Perception: Within Functional Limits ?Praxis ?Praxis: Intact  ?Cognition ?Overall Cognitive Status: Within Functional Limits for tasks assessed ?Arousal/Alertness: Awake/alert ?Orientation Level: Oriented X4 ?Awareness: Appears intact ?Problem Solving: Appears intact ?Safety/Judgment: Appears intact ?Sensation ?Sensation ?Light Touch: Appears Intact ?Hot/Cold: Appears Intact ?Proprioception: Appears Intact ?Stereognosis: Appears Intact ?Coordination ?Gross Motor Movements are Fluid and Coordinated: No ?Fine Motor Movements are Fluid and Coordinated: No ?Coordination and Movement Description: impacting by mild R hemi. ?Heel Shin Test: [facility] - slowed on R ?Motor  ?Motor ?Motor: Hemiplegia ?Motor - Skilled Clinical Observations: mild R hemi  ? ?Trunk/Postural Assessment  ?Cervical Assessment ?Cervical Assessment: Exceptions to [facility] (forward head) ?Thoracic Assessment ?Thoracic Assessment: Exceptions to [facility] (some mild kyphosis, rounded shoulders) ?Lumbar Assessment ?Lumbar Assessment: Exceptions to [facility] (posterior pelvic tilt) ?Postural Control ?Postural Control: Within Functional Limits  ?Balance ?Balance ?Balance Assessed: Yes ?Standardized Balance Assessment ?Standardized Balance Assessment: Berg Balance Test;Timed Up and Go Test ?Berg Balance Test ?Sit to Stand: Able to stand without using hands and stabilize independently ?Standing Unsupported: Able to stand 2 minutes with supervision ?Sitting with Back Unsupported but Feet Supported on Floor or Stool: Able to sit safely and securely 2 minutes ?Stand to Sit: Sits safely with minimal use of hands ?Transfers: Able to transfer with verbal cueing  and /or supervision ?Standing Unsupported with Eyes Closed: Able to stand 10 seconds with supervision ?Standing Ubsupported with Feet Together: Able to place feet together independently and stand for 1 minute wi

## 2021-10-18 NOTE — Evaluation (Signed)
Speech Language Pathology Assessment and Plan ? ?Patient Details  ?Name: Leslie Kim ?MRN: [id number] ?Date of Birth: November 18, 1935 ? ?Today's Date: 10/18/2021 ?SLP Individual Time: 1304-1400 ?SLP Individual Time Calculation (min): 56 min ? ?Hospital Problem: Principal Problem: ?  Left middle cerebral artery stroke ([place]) ? ?Past Medical History:  ?Past Medical History:  ?Diagnosis Date  ? Allergy   ? Cataract   ? CHF (congestive heart failure) ([place])   ? COPD (chronic obstructive pulmonary disease) ([place])   ? Essential hypertension   ? New onset atrial fibrillation ([place]) 11/2015  ? a. s/p DCCV on 12/18/2015 b. started on Eliquis  ? Nonischemic cardiomyopathy ([place])   ? a. 11/2015: echo showing EF of 30-35%, cath with nonobstructive dz  ? Type 2 diabetes mellitus ([place])   ? ?Past Surgical History:  ?Past Surgical History:  ?Procedure Laterality Date  ? CARDIAC CATHETERIZATION N/A 12/23/2015  ? Procedure: Right/Left Heart Cath and Coronary Angiography;  Surgeon: Larey Dresser, MD;  Location: [facility] [facility];  Service: Cardiovascular;  Laterality: N/A;  ? cataract surgery    ? CHOLECYSTECTOMY    ? ? ?Assessment / Plan / Recommendation ?Clinical Impression Leslie Kim is a 86 year old female with history of CHF,COPD, hypertension,atrial fibrillation maintained on Eliquis.diabetes mellitus, type 2, quit smoking 13 years ago. Per chart review patient lives alone.1 level home one-step to entry. Independent ambulation without assistive device. Presented to [facility] 10/14/2021 with acute onset of right-sided weakness as well as some double vision. CT MRI showed acute small vessel infarct of the left corona radiata. Patient did not receive tPA. MRA head and neck plaque in the right greater than left ICA origins with less than 50% stenosis.Tolerating a regular consistency diet.  Therapy evaluations completed due to patient's right side weakness decreased functional mobility was admitted for a comprehensive rehab  program. ? ?Patient seen for speech, language, and cognitive assessment in setting of recent CVA. Patient presenting WFL in all tested areas and likely at baseline level of function. SLP administered the [facility] Mental Status Examination (SLUMS) and patient scored 26/30 points with a score of 27 or above considered within normal range. Patient displayed decreased recall of novel information, however successful when given semantic cues. Pt endorsed decreased short-term recall at baseline and referenced various compensations she used including writing things down, calendar, and frequent check-ins from children. Pt reported her children go with her to the grocery store and all doctor appointments. She also reported that her son plans to move in with her at discharge. Patient's overall auditory comprehension and verbal expression appeared [facility] for all tasks assessed and patient was 100% intelligible at the conversation level. Skilled SLP services do not appear clinically indicated at this time. ST to sign off. ?  ?Skilled Therapeutic Interventions          Pt participated in [place] Status Examination (SLUMS) as well as further non-standardized assessments of cognitive-linguistic, speech, and language function. Please see above.     ?SLP Assessment ? Patient does not need any further Speech Lanaguage Pathology Services  ?  ?Recommendations ? Patient destination: Home ?Follow up Recommendations: None ?Equipment Recommended: None recommended by SLP  ?  ?   ?   ? ?Pain ?Pain Assessment ?Pain Scale: 0-10 ?Pain Score: 0-No pain ? ?Prior Functioning ?Cognitive/Linguistic Baseline: Information not available (Patient endorsed decreased short-term recall at baseline) ?Type of Home: House ? Lives With: Alone;Other (Comment) (son plans to move  in with her) ?Available Help at Discharge: Family;Available PRN/intermittently ?Education: High school ?Vocation: Retired ? ?SLP  Evaluation ?Cognition ?Overall Cognitive Status: Within Functional Limits for tasks assessed ?Arousal/Alertness: Awake/alert ?Orientation Level: Oriented X4 ?Year: 2023 ?Month: March ?Day of Week: Correct ?Attention: Focused;Sustained;Alternating;Selective ?Focused Attention: Appears intact ?Sustained Attention: Appears intact ?Selective Attention: Appears intact ?Memory: Impaired ?Memory Impairment: Decreased recall of new information ?Awareness: Appears intact ?Problem Solving: Appears intact ?Executive Function: Reasoning;Organizing;Self Monitoring ?Reasoning: Appears intact ?Organizing: Appears intact ?Self Monitoring: Appears intact ?Safety/Judgment: Appears intact  ?Comprehension ?Auditory Comprehension ?Overall Auditory Comprehension: Appears within functional limits for tasks assessed ?Expression ?Expression ?Primary Mode of Expression: Verbal ?Verbal Expression ?Overall Verbal Expression: Appears within functional limits for tasks assessed ?Written Expression ?Dominant Hand: Right ?Oral Motor ?Oral Motor/Sensory Function ?Overall Oral Motor/Sensory Function: Within functional limits ?Motor Speech ?Overall Motor Speech: Appears within functional limits for tasks assessed ?Articulation: Within functional limitis ?Intelligibility: Intelligible ? ?Care Tool ?Care Tool Cognition ?Ability to hear (with hearing aid or hearing appliances if normally used Ability to hear (with hearing aid or hearing appliances if normally used): 0. Adequate - no difficulty in normal conservation, social interaction, listening to TV ?  ?Expression of Ideas and Wants Expression of Ideas and Wants: 4. Without difficulty (complex and basic) - expresses complex messages without difficulty and with speech that is clear and easy to understand ?  ?Understanding Verbal and Non-Verbal Content Understanding Verbal and Non-Verbal Content: 4. Understands (complex and basic) - clear comprehension without cues or repetitions  ?Memory/Recall  Ability Memory/Recall Ability : Current season;Staff names and faces;That he or she is in a hospital/hospital unit  ? ? ?Recommendations for other services: None  ? ?Discharge Criteria: Patient will be discharged from SLP if patient refuses treatment 3 consecutive times without medical reason, if treatment goals not met, if there is a change in medical status, if patient makes no progress towards goals or if patient is discharged from hospital. ? ?The above assessment, treatment plan, treatment alternatives and goals were discussed and mutually agreed upon: by patient ? ?Chaze Hruska T Ajamu Maxon ?10/18/2021, 4:08 PM ? ? ?

## 2021-10-18 NOTE — Progress Notes (Signed)
Occupational Therapy Assessment and Plan ? ?Patient Details  ?Name: Leslie Kim ?MRN: [id number] ?Date of Birth: Apr 18, 1936 ? ?OT Diagnosis: abnormal posture, ataxia, cognitive deficits, disturbance of vision, hemiplegia affecting dominant side, and muscle weakness (generalized) ?Rehab Potential: Rehab Potential (ACUTE ONLY): Excellent ?ELOS: 7-9 days  ? ?Today's Date: 10/18/2021 ?OT Individual Time:  [id number] ?     ? ?Hospital Problem: Principal Problem: ?  Left middle cerebral artery stroke ([place]) ? ? ?Past Medical History:  ?Past Medical History:  ?Diagnosis Date  ? Allergy   ? Cataract   ? CHF (congestive heart failure) ([place])   ? COPD (chronic obstructive pulmonary disease) ([place])   ? Essential hypertension   ? New onset atrial fibrillation ([place]) 11/2015  ? a. s/p DCCV on 12/18/2015 b. started on Eliquis  ? Nonischemic cardiomyopathy ([place])   ? a. 11/2015: echo showing EF of 30-35%, cath with nonobstructive dz  ? Type 2 diabetes mellitus ([place])   ? ?Past Surgical History:  ?Past Surgical History:  ?Procedure Laterality Date  ? CARDIAC CATHETERIZATION N/A 12/23/2015  ? Procedure: Right/Left Heart Cath and Coronary Angiography;  Surgeon: Larey Dresser, MD;  Location: [facility];  Service: Cardiovascular;  Laterality: N/A;  ? cataract surgery    ? CHOLECYSTECTOMY    ? ? ?Assessment & Plan ?Clinical Impression: Leslie Kim is a 86 year old female with history of CHF,COPD, hypertension,atrial fibrillation maintained on Eliquis.diabetes mellitus, type 2, quit smoking 13 years ago.  Per chart review patient lives alone.  1 level home one-step to entry.  Independent ambulation without assistive device.  Presented to [facility]/14/2021 with acute onset of right-sided weakness as well as some double vision.  CT MRI showed acute small vessel infarct of the left corona radiata.  Patient did not receive tPA.  MRA head and neck plaque in the right greater than left ICA origins with less than 50% stenosis.   Echocardiogram with ejection fraction of 55 to [id number] grade 1 diastolic dysfunction.  Admission chemistries unremarkable except sodium 133, creatinine 1.33, BUN 30, hemoglobin 10.8, urine drug screen negative.  Neurology follow-up patient currently remains on Eliquis as prior to admission.  Tolerating a regular consistency diet. Patient transferred to CIR on 10/17/2021 .   ? ?Patient currently requires min with basic self-care skills secondary to muscle weakness, decreased cardiorespiratoy endurance, ataxia and decreased coordination, decreased visual motor skills, decreased problem solving, decreased memory, and delayed processing, and decreased sitting balance, decreased standing balance, decreased postural control, hemiplegia, and decreased balance strategies.  Prior to hospitalization, patient could complete ADLs and IADls with independent . ? ?Patient will benefit from skilled intervention to increase independence with basic self-care skills prior to discharge home with care partner.  Anticipate patient will require intermittent supervision and follow up home health. ? ?OT - End of Session ?Activity Tolerance: Tolerates 30+ min activity with multiple rests ?Endurance Deficit: Yes ?Endurance Deficit Description: seated rest breaks during functional tasks ?OT Assessment ?Rehab Potential (ACUTE ONLY): Excellent ?OT Patient demonstrates impairments in the following area(s): Balance;Cognition;Endurance;Sensory;Vision;Motor ?OT Basic ADL's Functional Problem(s): Eating;Grooming;Bathing;Dressing;Toileting ?OT Transfers Functional Problem(s): Toilet;Tub/Shower ?OT Additional Impairment(s): Fuctional Use of Upper Extremity ?OT Plan ?OT Intensity: Minimum of 1-2 x/day, 45 to 90 minutes ?OT Frequency: 5 out of 7 days ?OT Duration/Estimated Length of Stay: 7-9 days ?OT Treatment/Interventions: Balance/vestibular training;Discharge planning;Functional electrical stimulation;Pain management;Self Care/advanced ADL  retraining;Therapeutic Activities;UE/LE Coordination activities;Cognitive remediation/compensation;Disease mangement/prevention;Patient/family education;Functional mobility training;Skin care/wound managment;Therapeutic Exercise;Visual/perceptual remediation/compensation;Community reintegration;DME/adaptive equipment instruction;Neuromuscular re-education;Psychosocial  support;Splinting/orthotics;UE/LE Strength taining/ROM;Wheelchair propulsion/positioning ?OT Self Feeding Anticipated Outcome(s): mod I ?OT Basic Self-Care Anticipated Outcome(s): mod I- supervision ?OT Toileting Anticipated Outcome(s): mod I ?OT Bathroom Transfers Anticipated Outcome(s): mod I ?OT Recommendation ?Patient destination: Home ?Follow Up Recommendations: 24 hour supervision/assistance ?Equipment Recommended: To be determined ? ?OT Evaluation ?Precautions/Restrictions  ?Precautions ?Precautions: Fall ?Precaution Comments: mild R hemi ?Restrictions ?Weight Bearing Restrictions: No ?General ?Chart Reviewed: Yes ?Pain ?Pain Assessment ?Pain Scale: 0-10 ?Pain Score: 0-No pain ?Home Living/Prior Functioning ?Home Living ?Living Arrangements: Alone (husband recently passed last week-son to move in with her) ?Available Help at Discharge: Family, Available PRN/intermittently (son moving in with her and will stay in basement) ?Type of Home: House ?Home Access: Stairs to enter ?Entrance Stairs-Number of Steps: 1 ?Entrance Stairs-Rails: None ?Home Layout: Other (Comment), Multi-level ?Alternate Level Stairs-Number of Steps: 6 ?Alternate Level Stairs-Rails: Right ?Bathroom Shower/Tub: Walk-in shower ?Bathroom Toilet: Standard ? Lives With: Alone ?Prior Function ?Level of Independence: Independent with gait, Independent with transfers, Independent with basic ADLs, Independent with homemaking with ambulation ? Able to Take Stairs?: Yes ?Driving: Yes ?Vocation: Retired ?Leisure: Hobbies-yes (Comment) (cooking and canning food) ?Vision ?Baseline  Vision/History: 0 No visual deficits ?Ability to See in Adequate Light: 0 Adequate ?Patient Visual Report: No change from baseline ?Vision Assessment?: No apparent visual deficits ?Alignment/Gaze Preference: Within Defined Limits ?Tracking/Visual Pursuits: Requires cues, head turns, or add eye shifts to track;Unable to hold eye position out of midline ?Perception  ?Perception: Within Functional Limits ?Praxis ?Praxis: Intact ?Cognition ?Cognition ?Overall Cognitive Status: Within Functional Limits for tasks assessed ?Arousal/Alertness: Awake/alert ?Orientation Level: Person;Place;Situation ?Person: Oriented ?Place: Oriented ?Situation: Oriented ?Memory: Impaired ?Memory Impairment: Retrieval deficit ?Attention: Focused;Sustained;Alternating;Selective ?Focused Attention: Appears intact ?Sustained Attention: Appears intact ?Selective Attention: Appears intact ?Awareness: Appears intact ?Problem Solving: Appears intact ?Safety/Judgment: Appears intact ?Brief Interview for Mental Status (BIMS) ?Repetition of Three Words (First Attempt): 3 ?Temporal Orientation: Year: Correct ?Temporal Orientation: Month: Accurate within 5 days ?Temporal Orientation: Day: Correct ?Recall: "Sock": Yes, no cue required ?Recall: "Blue": Yes, after cueing ("a color") ?Recall: "Bed": Yes, no cue required ?BIMS Summary Score: 14 ?Sensation ?Sensation ?Light Touch: Appears Intact ?Hot/Cold: Appears Intact ?Proprioception: Appears Intact ?Stereognosis: Appears Intact ?Coordination ?Gross Motor Movements are Fluid and Coordinated: No ?Fine Motor Movements are Fluid and Coordinated: No ?Coordination and Movement Description: impacting by mild R hemi. ?Finger Nose Finger Test: mild ataxia RUE; WNL LUE ?Heel Shin Test: [facility] - slowed on R ?Motor  ?Motor ?Motor: Hemiplegia ?Motor - Skilled Clinical Observations: mild R hemi  ?Trunk/Postural Assessment  ?Cervical Assessment ?Cervical Assessment: Exceptions to [facility] (forward head) ?Thoracic  Assessment ?Thoracic Assessment: Exceptions to [facility] (rounded shoulders) ?Lumbar Assessment ?Lumbar Assessment: Exceptions to [facility] (posterior pelvic tilt; mild) ?Postural Control ?Postural Control: Within Functional Limits  ?Balance ?Balance

## 2021-10-19 LAB — GLUCOSE, CAPILLARY
Glucose-Capillary: 157 mg/dL — ABNORMAL HIGH (ref 70–99)
Glucose-Capillary: 160 mg/dL — ABNORMAL HIGH (ref 70–99)
Glucose-Capillary: 145 mg/dL — ABNORMAL HIGH (ref 70–99)
Glucose-Capillary: 184 mg/dL — ABNORMAL HIGH (ref 70–99)

## 2021-10-19 LAB — VITAMIN B12: Vitamin B-12: 509 pg/mL (ref 180–914)

## 2021-10-19 LAB — VITAMIN D 25 HYDROXY (VIT D DEFICIENCY, FRACTURES): Vit D, 25-Hydroxy: 68.09 ng/mL (ref 30–100)

## 2021-10-19 LAB — BASIC METABOLIC PANEL
Anion gap: 11 (ref 5–15)
BUN: 34 mg/dL — ABNORMAL HIGH (ref 8–23)
CO2: 23 mmol/L (ref 22–32)
Calcium: 9.6 mg/dL (ref 8.9–10.3)
Chloride: 100 mmol/L (ref 98–111)
Creatinine, Ser: 1.53 mg/dL — ABNORMAL HIGH (ref 0.44–1.00)
GFR, Estimated: 34 mL/min — ABNORMAL LOW (ref >60)
Glucose, Bld: 176 mg/dL — ABNORMAL HIGH (ref 70–99)
Potassium: 4.3 mmol/L (ref 3.5–5.1)
Sodium: 134 mmol/L — ABNORMAL LOW (ref 135–145)

## 2021-10-19 LAB — IRON AND TIBC
Iron: 40 ug/dL (ref 28–170)
Saturation Ratios: 10 % — ABNORMAL LOW (ref 10.4–31.8)
TIBC: 385 ug/dL (ref 250–450)
UIBC: 345 ug/dL

## 2021-10-19 LAB — MAGNESIUM: Magnesium: 1.8 mg/dL (ref 1.7–2.4)

## 2021-10-19 MED ORDER — SORBITOL 70 % SOLN
15.0000 mL | Freq: Every day | Status: DC | PRN
  Administered 2021-10-19: 15 mL via ORAL
  Filled 2021-10-19: qty 30

## 2021-10-19 MED ORDER — POLYETHYLENE GLYCOL 3350 17 G PO PACK
17.0000 g | PACK | Freq: Every day | ORAL | Status: DC
  Administered 2021-10-19 – 2021-10-24 (×3): 17 g via ORAL
  Filled 2021-10-19 (×8): qty 1

## 2021-10-19 MED ORDER — FUROSEMIDE 20 MG PO TABS
20.0000 mg | ORAL_TABLET | Freq: Every day | ORAL | Status: DC
Start: 2021-10-20 — End: 2021-10-20
  Administered 2021-10-20: 20 mg via ORAL
  Filled 2021-10-19: qty 1

## 2021-10-19 MED ORDER — MAGNESIUM HYDROXIDE 400 MG/5ML PO SUSP
30.0000 mL | Freq: Once | ORAL | Status: AC
  Administered 2021-10-19: 30 mL via ORAL
  Filled 2021-10-19: qty 30

## 2021-10-19 MED ORDER — APIXABAN 2.5 MG PO TABS
2.5000 mg | ORAL_TABLET | Freq: Two times a day (BID) | ORAL | Status: DC
  Administered 2021-10-19 – 2021-10-23 (×9): 2.5 mg via ORAL
  Filled 2021-10-19 (×9): qty 1

## 2021-10-19 NOTE — Progress Notes (Signed)
Inpatient Rehabilitation Admission Medication Review by a Pharmacist ? ?A complete drug regimen review was completed for this patient to identify any potential clinically significant medication issues. ? ?High Risk Drug Classes Is patient taking? Indication by Medication  ?Antipsychotic No   ?Anticoagulant Yes Eliquis for CVA / Afib  ?Antibiotic No   ?Opioid No   ?Antiplatelet No   ?Hypoglycemics/insulin No   ?Vasoactive Medication Yes Carvedilol, losartan for BP, CHF ?Cleda Daub, furosemide  for BP, fluid, CHF  ?Chemotherapy No   ?Other Yes Inhalers for COPD ?Protonix for GERD ?Lipitor for HLD  ? ? ? ?Type of Medication Issue Identified Description of Issue Recommendation(s)  ?Drug Interaction(s) (clinically significant) ?    ?Duplicate Therapy ?    ?Allergy ?    ?No Medication Administration End Date ?    ?Incorrect Dose ?    ?Additional Drug Therapy Needed ?    ?Significant med changes from prior encounter (inform family/care partners about these prior to discharge). Janumet on hold Resume if and when appropriate for DM  ?Other ?    ? ? ?Clinically significant medication issues were identified that warrant physician communication and completion of prescribed/recommended actions by midnight of the next day:  No ? ?Pharmacist comments: None ? ?Time spent performing this drug regimen review (minutes):  20 minutes ? ? ?Leslie Kim ?10/17/2021 2:06 PM ?

## 2021-10-19 NOTE — Plan of Care (Signed)
?  Problem: Consults ?Goal: RH STROKE PATIENT EDUCATION ?Description: See Patient Education module for education specifics  ?Outcome: Progressing ?  ?Problem: RH BOWEL ELIMINATION ?Goal: RH STG MANAGE BOWEL WITH ASSISTANCE ?Description: STG Manage Bowel with mod I Assistance. ?Outcome: Progressing ?Goal: RH STG MANAGE BOWEL W/MEDICATION W/ASSISTANCE ?Description: STG Manage Bowel with Medication with mod I Assistance. ?Outcome: Progressing ?  ?Problem: RH SAFETY ?Goal: RH STG ADHERE TO SAFETY PRECAUTIONS W/ASSISTANCE/DEVICE ?Description: STG Adhere to Safety Precautions With cues Assistance/Device. ?Outcome: Progressing ?  ?Problem: RH PAIN MANAGEMENT ?Goal: RH STG PAIN MANAGED AT OR BELOW PT'S PAIN GOAL ?Description: At or below level 4 with prns ?Outcome: Progressing ?  ?Problem: RH KNOWLEDGE DEFICIT ?Goal: RH STG INCREASE KNOWLEDGE OF DIABETES ?Description: Patient and family will be able to manage DM with medications and dietary modifications using handouts and educational resources independently ?Outcome: Progressing ?Goal: RH STG INCREASE KNOWLEDGE OF HYPERTENSION ?Description: Patient and family will be able to manage HTN with medications and dietary modifications using handouts and educational resources independently ?Outcome: Progressing ?Goal: RH STG INCREASE KNOWLEGDE OF HYPERLIPIDEMIA ?Description: Patient and family will be able to manage HLD with medications and dietary modifications using handouts and educational resources independently ?Outcome: Progressing ?Goal: RH STG INCREASE KNOWLEDGE OF STROKE PROPHYLAXIS ?Description: Patient and family will be able to manage secondary stroke risks with medications and dietary modifications using handouts and educational resources independently ?Outcome: Progressing ?  ?

## 2021-10-19 NOTE — Progress Notes (Signed)
Physical Therapy Session Note ? ?Patient Details  ?Name: Leslie Kim ?MRN: [id number] ?Date of Birth: 10/25/1935 ? ?Today's Date: 10/19/2021 ?PT Missed Time: 30 Minutes ?Missed Time Reason: Nursing care ? ?Pt receiving nursing care, unable to participate at this time. Will reattempt as schedule permits.  ? ? ?Orrin Brigham PT ?10/19/2021, 7:26 AM  ?

## 2021-10-19 NOTE — Progress Notes (Addendum)
Patient ID: Leslie Kim, female   DOB: May 07, 1936, 86 y.o.   MRN: [id number]  Met with pt to discuss team conference goals of supervision to mod/I and target discharge date of 3/28. Pt is exhausted form her bowel issues and reports she still has not gone yet. Have left message with daughter to let know team conference and see if any questions. Will await return call. ? ?1:40 pm Spoke with Carole-daughter to update her and she was happy Mom is doing so well but felt she will need to be on a bedside commode or in the bathroom to be able to have a bowel movement, a bedpan will not do. She reports she will have her brother with her at home. ?

## 2021-10-19 NOTE — Progress Notes (Signed)
Patient was working with therapy. Per therapy, pt started leaning to one side and said she felt very lightheaded. RN and tech called to room. Upon arrival to room, pt laying in bed, talking. Stated she felt very lightheaded and tired. Vital signs 122/64, HR 66, SPO2 100% on RA, respirations 21. Pt currently resting in bed, bed alarm set and call bell within reach. Pt instructed to not get up without assistance, verbalized understanding. Mariam Dollar, PA notified.  ?

## 2021-10-19 NOTE — Progress Notes (Signed)
Patient ID: Leslie Kim, female   DOB: [birth year], 86 y.o.   MRN: [id number] ?Met with the patient to introduce self, review rehab process, team conference and plan of care. Reviewed secondary risks inclduing DM (A1c 6.3), HTNl HLD and HF. Discussed medication and dietary modification recommendations. Patient expressed concerns regarding constipation; MD added medications. Continue to follow along to discharge to address educational needs to facilitate discharge home w son. Dorien Chihuahua B ? ?

## 2021-10-19 NOTE — Patient Care Conference (Signed)
Inpatient RehabilitationTeam Conference and Plan of Care Update ?Date: 10/19/2021   Time: 11:09 AM  ? ? ?Patient Name: Leslie Kim      ?Medical Record Number: [id number]  ?Date of Birth: May 11, 1936 ?Sex: Female         ?Room/Bed: 4M05C/4M05C-01 ?Payor Info: Payor: [employer] / Plan: [facility] MEDICARE / Product Type: *No Product type* /   ? ?Admit Date/Time:  10/17/2021  1:23 PM ? ?Primary Diagnosis:  Left middle cerebral artery stroke (HCC) ? ?Hospital Problems: Principal Problem: ?  Left middle cerebral artery stroke ([place]) ? ? ? ?Expected Discharge Date: Expected Discharge Date: 10/25/21 ? ?Team Members Present: ?Physician leading conference: Dr. Leeroy Cha ?Social Worker Present: Ovidio Kin, LCSW ?Nurse Present: Dorien Chihuahua, RN ?PT Present: Ginnie Smart, PT ?OT Present: Leretha Pol, OT ?SLP Present: Sherren Kerns, SLP ?PPS Coordinator present : Gunnar Fusi, SLP ? ?   Current Status/Progress Goal Weekly Team Focus  ?Bowel/Bladder ? ?   Continent; constipation   Bowel and bladder managed   Address constipation and recommend dietary modification/med adjustment  ?Swallow/Nutrition/ Hydration ? ?           ?ADL's ? ? CGA-close supervision  independent-supervision  self care training, functional transfers, fine motor neuro re-ed,   ?Mobility ? ? CGA sit<>stand and stand<>pivot transfers, CGA gait 139ft with RW, CGA 8 steps with 1 railing. BERG 35/56  Overall supervision (some mod I goals)  dynamic standing balance, RLE NMR, endurance training, DC planning, gait training with LRAD   ?Communication ? ?           ?Safety/Cognition/ Behavioral Observations ? eval pending         ?Pain ? ?   N/A        ?Skin ? ?   N/A        ? ? ?Discharge Planning:  ?HOme son to move in with her and is currently not working hurt his hand. Pt feels doing well and hopes to go home soon. Husband's funeral was MOnday 3/20   ?Team Discussion: ?Patient limited by constipation. Reported doing well overall. ? ?Patient  on target to meet rehab goals: ?yes, currently CGA - supervision overall. Working on fine motor issues. Able to ambulate up to 150' with CGA.  ? ?*See Care Plan and progress notes for long and short-term goals.  ? ?Revisions to Treatment Plan:  ?SLP eval and discharge  ? ?Teaching Needs: ?Safety, medications, dietary modifications, secondary risk management, etc  ?Current Barriers to Discharge: ?Decreased caregiver support and Home enviroment access/layout ? ?Possible Resolutions to Barriers: ?Family education with son ?DME: RW ?  ? ? Medical Summary ?Current Status: constipation, denies pain, overweight, atrial fibrillation with RVR, CHF ? Barriers to Discharge: Medical stability;Weight;Neurogenic Bowel & Bladder ? Barriers to Discharge Comments: constipation, overweight, atrial fibrillation with RVR, CHF ?Possible Resolutions to [employer]: milk of magnesia today, provide dietary education, continue Coreg and monitor HR TID, continue 2 tabs senna, continue Lasix and sdjust as per AKI ? ? ?Continued Need for Acute Rehabilitation Level of Care: The patient requires daily medical management by a physician with specialized training in physical medicine and rehabilitation for the following reasons: ?Direction of a multidisciplinary physical rehabilitation program to maximize functional independence : Yes ?Medical management of patient stability for increased activity during participation in an intensive rehabilitation regime.: Yes ?Analysis of laboratory values and/or radiology reports with any subsequent need for medication adjustment and/or medical intervention. : Yes ? ? ?I attest  that I was present, lead the team conference, and concur with the assessment and plan of the team. ? ? ?Dorien Chihuahua B ?10/19/2021, 5:07 PM  ? ? ? ? ? ? ?

## 2021-10-19 NOTE — Progress Notes (Signed)
Physical Therapy Session Note ? ?Patient Details  ?Name: Leslie Kim ?MRN: [id number] ?Date of Birth: February 11, 1936 ? ?Today's Date: 10/19/2021 ?PT Individual Time: 9022-8406 and 1020-1025 ?PT Individual Time Calculation (min): 38 min and 15 min ? ?Short Term Goals: ?Week 1:  PT Short Term Goal 1 (Week 1): STG = LTG ? ?Skilled Therapeutic Interventions/Progress Updates: Pt presented sitting EOB agreeable to therapy. Pt c/o feeling constipated then c/o feeling lightheaded. PTA asking if BP has been checked when pt began leaning to R and on table tray. PTA encouraged pt to lean to L (against bed)to allow PTA to obtain vitals when pt began leaning posteriorly and non-responsive. PTA then assisted pt to supine and turned to postion in bed and called for assistance. In supine pt's symptoms resolved and vitals checked WNL (see flowsheet). Pt left resting comfortably in bed with nsg present. Pt missed 30 min skilled PT due to near syncopal episode.  ? ?Tx2: Pt presented in bed agreeable to therapy. Pt denies pain and states had BM and is starting to feel a bit better. PTA explaining orthostatics and was preparing to take pt's vitals due to am session when pt stating urgent need for BM. Pt performed bed mobility with supervision and transferred to toilet in bathroom with RW and CGA. Pt was CGA for clothing management with pt unsuccessfully clearing pants/brief in time. Pt was able to doff clothing with supervision with PTA obtaining disposable brief and washcloths. Pt was able to perform several bouts of peri-care in both sitting and standing due to additional BM's. PTA assisting with disposable brief in standing however pt with additional BM requiring second change in brief. Pt performed peri-care and PTA donning brief again pt then able to ambulate to recliner with RW and CGA. Once on recliner PTA assisted with donning pants for time management with additional BM once in standing and requiring change in brief from PTA. After  final brief change pt left in recliner resting comfortably with belt alarm on, call bell within reach and needs met.  ?   ? ?Therapy Documentation ?Precautions:  ?Precautions ?Precautions: Fall ?Precaution Comments: mild R hemi ?Restrictions ?Weight Bearing Restrictions: No ?General: ?  ?Vital Signs: ?Therapy Vitals ?Temp: 98 ?F (36.7 ?C) ?Temp Source: Oral ?Pulse Rate: 79 ?Resp: 15 ?BP: (!) 119/59 ?Patient Position (if appropriate): Lying ?Oxygen Therapy ?SpO2: 98 % ?O2 Device: Room Air ?Pain: ?  ?Mobility: ?  ?Locomotion : ?   ?Trunk/Postural Assessment : ?   ?Balance: ?  ?Exercises: ?  ?Other Treatments:   ? ? ? ?Therapy/Group: Individual Therapy ? ?[name] ?10/19/2021, 4:05 PM  ?

## 2021-10-19 NOTE — Progress Notes (Signed)
Occupational Therapy Session Note ? ?Patient Details  ?Name: Leslie Kim ?MRN: [id number] ?Date of Birth: [birth year] ? ?Today's Date: 10/19/2021 ?OT Individual Time: [id number] ?OT Individual Time Calculation (min): 12 min  and Today's Date: 10/19/2021 ?OT Missed Time: 18 Minutes ?Missed Time Reason: Other (comment);Patient fatigue ? ? ?Short Term Goals: ?Week 1:  OT Short Term Goal 1 (Week 1): STGs=LTGs due to ELOS ? ?Skilled Therapeutic Interventions/Progress Updates:  ?  Pt just returned to bed with nursing following a successful bowel movement. Pt reports she still feels like she "has more." OT intervention with focus on bed mobility and RUE NMR/functional tasks to increase functional use/strength. Pt states she was able to use her RUE for eating meals. Pt fatigued from exertion having bowel movement. Pt missed 18 mins skilled OT services.  ? ?Therapy Documentation ?Precautions:  ?Precautions ?Precautions: Fall ?Precaution Comments: mild R hemi ?Restrictions ?Weight Bearing Restrictions: No ?General: ?General ?OT Amount of Missed Time: 18 Minutes ?PT Missed Treatment Reason: Nursing care ?Vital Signs: ?Therapy Vitals ?Pulse Rate: 66 ?Resp: (!) 21 ?BP: 122/64 ?Oxygen Therapy ?SpO2: 100 % ?O2 Device: Room Air ?Pain: ? Pt reports her bowels still feel uncomfortable; repositoined ? ? ?Therapy/Group: Individual Therapy ? ?Leroy Libman ?10/19/2021, 2:13 PM ?

## 2021-10-19 NOTE — Progress Notes (Signed)
?                                                       PROGRESS NOTE ? ? ?Subjective/Complaints: ?No new complaints this morning ?Slept well last night but woke early this morning on her own ?Did not have a BM yesterday and usually has one every day ? ?ROS: +constipation, +insomnia due to constipation ? ? ?Objective: ?  ?No results found. ?Recent Labs  ?  10/18/21 ?[id number]  ?WBC 10.3  ?HGB 10.9*  ?HCT 34.0*  ?PLT 224  ? ?Recent Labs  ?  10/18/21 ?[id number] 10/19/21 ?0600  ?NA 133* 134*  ?K 3.8 4.3  ?CL 101 100  ?CO2 22 23  ?GLUCOSE 165* 176*  ?BUN 27* 34*  ?CREATININE 1.45* 1.53*  ?CALCIUM 9.4 9.6  ? ? ?Intake/Output Summary (Last 24 hours) at 10/19/2021 1113 ?Last data filed at 10/19/2021 0710 ?Gross per 24 hour  ?Intake 598 ml  ?Output --  ?Net 598 ml  ?  ? ?  ? ?Physical Exam: ?Vital Signs ?Blood pressure 122/64, pulse 66, temperature 97.6 ?F (36.4 ?C), resp. rate (!) 21, height 5\' 3"  (1.6 m), weight 73.1 kg, SpO2 100 %. ?Gen: no distress, normal appearing ?HEENT: oral mucosa pink and moist, NCAT ?Cardio: Reg rate ?Chest: tachypneic ?Abd: soft, non-distended ?Ext: no edema ?Psych: pleasant, normal affect ?Skin: intact ?Neurological:  ?   Mental Status: She is alert.  ?   Comments: Alert and oriented x 3. Normal insight and awareness. Intact Memory. Normal language and speech. Cranial nerve exam unremarkable except for mild right central 7. RUE 4-/5 prox to distal with +PD and decreased [place] with FTN. RLE 4 to 4+/5 prox to distal. LUE and LLE 5/5. Sensory exam normal for light touch and pain in all 4 limbs. No limb ataxia or cerebellar signs. No abnormal tone appreciated.    ?  ? ? ?Assessment/Plan: ?1. Functional deficits which require 3+ hours per day of interdisciplinary therapy in a comprehensive inpatient rehab setting. ?Physiatrist is providing close team supervision and 24 hour management of active medical problems listed below. ?Physiatrist and rehab team continue to assess barriers to discharge/monitor patient  progress toward functional and medical goals ? ?Care Tool: ? ?Bathing ?   ?   ?   ?  ?  ?Bathing assist Assist Level: Contact Guard/Touching assist ?  ?  ?Upper Body Dressing/Undressing ?Upper body dressing   ?What is the patient wearing?:  (pt own pjs) ?   ?Upper body assist Assist Level: Supervision/Verbal cueing ?   ?Lower Body Dressing/Undressing ?Lower body dressing ? ? ?   ?What is the patient wearing?:  (pt own pjs) ? ?  ? ?Lower body assist Assist for lower body dressing: Supervision/Verbal cueing ?   ? ?Toileting ?Toileting    ?Toileting assist Assist for toileting: Contact Guard/Touching assist ?  ?  ?Transfers ?Chair/bed transfer ? ?Transfers assist ?   ? ?Chair/bed transfer assist level: Supervision/Verbal cueing ?  ?  ?Locomotion ?Ambulation ? ? ?Ambulation assist ? ?   ? ?Assist level: Contact Guard/Touching assist ?Assistive device: Walker-rolling ?Max distance: 174ft  ? ?Walk 10 feet activity ? ? ?Assist ?   ? ?Assist level: Contact Guard/Touching assist ?Assistive device: Walker-rolling  ? ?Walk 50 feet activity ? ? ?Assist   ? ?Assist level: Contact  Guard/Touching assist ?Assistive device: Walker-rolling  ? ? ?Walk 150 feet activity ? ? ?Assist   ? ?Assist level: Contact Guard/Touching assist ?Assistive device: Walker-rolling ?  ? ?Walk 10 feet on uneven surface  ?activity ? ? ?Assist   ? ? ?Assist level: Minimal Assistance - Patient > 75% ?Assistive device: Walker-rolling  ? ?Wheelchair ? ? ? ? ?Assist Is the patient using a wheelchair?: No ?  ?  ? ?  ?   ? ? ?Wheelchair 50 feet with 2 turns activity ? ? ? ?Assist ? ?  ?  ? ? ?   ? ?Wheelchair 150 feet activity  ? ? ? ?Assist ?   ? ? ?   ? ?Blood pressure 122/64, pulse 66, temperature 97.6 ?F (36.4 ?C), resp. rate (!) 21, height 5\' 3"  (1.6 m), weight 73.1 kg, SpO2 100 %. ? ?Medical Problem List and Plan: ?1. Functional deficits secondary to left corona radiata infarction with right HP. ?            -patient may shower ?            -ELOS/Goals:  9-12 days, supervision to [employer] with PT, OT, SLP ? HFU scheduled ?2.  Antithrombotics: ?-DVT/anticoagulation:  Pharmaceutical: Other (comment) ELIQUIS ?            -antiplatelet therapy: N/A ?3. Pain Management: Tylenol as needed ?4. Mood: Melatonin 6 mg nightly ?            -antipsychotic agents: N/A ?5. Neuropsych: This patient is capable of making decisions on her own behalf. ?6. Skin/Wound Care: Routine skin checks ?7. Fluids/Electrolytes/Nutrition: Routine in and out with follow-up chemistries ?8.  Diastolic congestive heart failure.  Lasix 40 mg daily.  Monitor for signs of fluid overload ?            -check weights daily ?9.  Hyperlipidemia.  Lipitor ?10.  Atrial fibrillation.  Continue Eliquis.  Cardiac rate controlled ?11.  Hypertension.  Coreg 6.25 mg twice daily, Cozaar 50 mg twice daily, Aldactone 25 mg daily.    ?            -monitor for control with increased physical activity.  ?12.  Diabetes mellitus type 2.  Hemoglobin A1c 6.3.   ?-Currently with SSI. Adjust regimen as needed. ?13.  COPD.  Quit smoking 13 years ago.  Continue inhalers as directed. ?14.  GERD.  Protonix ?15. Iron deficiency anemia: continue iron supplement ?16. Suboptimal B12: B12 checked and improved to 509 ?17. AKI: worsening, decrease lasix to 20mg  and repeat creatinine tomorrow.  ?18. Constipation: 80mL milk of magnesia today ? ?LOS: ?2 days ?A FACE TO FACE EVALUATION WAS PERFORMED ? ?Martha Clan P Eloina Ergle ?10/19/2021, 11:13 AM  ? ?  ?

## 2021-10-20 LAB — BASIC METABOLIC PANEL
Anion gap: 11 (ref 5–15)
BUN: 34 mg/dL — ABNORMAL HIGH (ref 8–23)
CO2: 23 mmol/L (ref 22–32)
Calcium: 9.4 mg/dL (ref 8.9–10.3)
Chloride: 100 mmol/L (ref 98–111)
Creatinine, Ser: 1.7 mg/dL — ABNORMAL HIGH (ref 0.44–1.00)
GFR, Estimated: 30 mL/min — ABNORMAL LOW (ref >60)
Glucose, Bld: 179 mg/dL — ABNORMAL HIGH (ref 70–99)
Potassium: 4.3 mmol/L (ref 3.5–5.1)
Sodium: 134 mmol/L — ABNORMAL LOW (ref 135–145)

## 2021-10-20 LAB — GLUCOSE, CAPILLARY
Glucose-Capillary: 181 mg/dL — ABNORMAL HIGH (ref 70–99)
Glucose-Capillary: 151 mg/dL — ABNORMAL HIGH (ref 70–99)
Glucose-Capillary: 149 mg/dL — ABNORMAL HIGH (ref 70–99)

## 2021-10-20 MED ORDER — UMECLIDINIUM BROMIDE 62.5 MCG/ACT IN AEPB
1.0000 | INHALATION_SPRAY | Freq: Every day | RESPIRATORY_TRACT | Status: DC
Start: 2021-10-20 — End: 2021-10-25
  Administered 2021-10-20 – 2021-10-24 (×5): 1 via RESPIRATORY_TRACT
  Filled 2021-10-20 (×3): qty 7

## 2021-10-20 MED ORDER — FLUTICASONE FUROATE-VILANTEROL 100-25 MCG/ACT IN AEPB
1.0000 | INHALATION_SPRAY | Freq: Every day | RESPIRATORY_TRACT | Status: DC
Start: 2021-10-21 — End: 2021-10-25
  Administered 2021-10-21 – 2021-10-24 (×4): 1 via RESPIRATORY_TRACT
  Filled 2021-10-20 (×2): qty 28

## 2021-10-20 NOTE — Progress Notes (Signed)
Occupational Therapy Session Note ? ?Patient Details  ?Name: Leslie Kim ?MRN: [id number] ?Date of Birth: [birth year] ? ?Today's Date: 10/20/2021 ?OT Individual Time: 1504-1364 ?OT Individual Time Calculation (min): 39 min  ? ? ?Short Term Goals: ?Week 1:  OT Short Term Goal 1 (Week 1): STGs=LTGs due to ELOS ? ?Skilled Therapeutic Interventions/Progress Updates:  ?  Pt received sitting in the recliner with no c/o pain. She completed an ambulatory transfer to the ADL apt, 100 ft with CGA overall using the RW. Pt completes kitchen search into various height cabinets/appliaces in prep for IADL retraining from (S)- CGA level with min cuing for safety/positioning throughout environment. Education on energy conservation techniques provided throughout activity. She completed a pouring activity, filling up a glass measuring cup with 2 cups of water and pouring into a bowl with min facilitation provided by her LUE. She took a seated rest break before ambulating 100 more ft to the therapy gym. From seated worked on a dexterity task, picking up and pushing in push pins with increased effort but (S) overall. She then completed modified wood chops in standing- holding a 3 lb ball and reaching distally toward her lateral knee and then powerfully diagonally overhead. 1x8 repetitions R and L with a rest break between. Pt completed 200 ft of functional mobility back to her room with CGA using the RW. She was left sitting up in the recliner with all needs met, chair alarm set.  ? ? ?Therapy Documentation ?Precautions:  ?Precautions ?Precautions: Fall ?Precaution Comments: mild R hemi ?Restrictions ?Weight Bearing Restrictions: No ? ?Therapy/Group: Individual Therapy ? ?Curtis Sites ?10/20/2021, 6:36 AM ?

## 2021-10-20 NOTE — Progress Notes (Signed)
Occupational Therapy Session Note ? ?Patient Details  ?Name: Leslie Kim ?MRN: [id number] ?Date of Birth: [birth year] ? ?Today's Date: 10/20/2021 ?OT Individual Time: 1030-1110 ?OT Individual Time Calculation (min): 40 min  ? ? ?Short Term Goals: ?Week 1:  OT Short Term Goal 1 (Week 1): STGs=LTGs due to ELOS ? ?Skilled Therapeutic Interventions/Progress Updates:  ?  Pt sitting up in recliner, reports feeling tired from PT but agreeable to bathing at shower level, no pain throughout session.  Pt completed all self care with setup-supervision including the following.  Sit to stand and ambulation ~8 feet using RW to bathroom, toilet transfer, toileting and continent of urine, a few steps taken to shower bench and stand to sit in walk in shower, UB/LB dressing and bathing at sit<>stand using grab bar, ambulation to recliner using RW ~8 feet.  Pt needed Vcs minimally for safety awareness to identify when sitting during ADLs would be appropriate versus completing in standing. Pt receptive to cues with good follow through.  Call bell in reach, seat alarm on. ? ?Therapy Documentation ?Precautions:  ?Precautions ?Precautions: Fall ?Precaution Comments: mild R hemi ?Restrictions ?Weight Bearing Restrictions: No ? ? ? ?Therapy/Group: Individual Therapy ? ?Dian Situ [name] ?10/20/2021, 11:17 AM ?

## 2021-10-20 NOTE — IPOC Note (Signed)
Overall Plan of Care (IPOC) ?Patient Details ?Name: Leslie Kim ?MRN: [id number] ?DOB: 1936/03/19 ? ?Admitting Diagnosis: Left middle cerebral artery stroke (HCC) ? ?Hospital Problems: Principal Problem: ?  Left middle cerebral artery stroke (HCC) ? ? ? ? Functional Problem List: ?Nursing Endurance, Pain, Medication Management, Safety  ?PT Balance, Endurance, Motor  ?OT Balance, Cognition, Endurance, Sensory, Vision, Motor  ?SLP    ?TR    ?    ? Basic ADL?s: ?OT Eating, Grooming, Bathing, Dressing, Toileting  ? ?  Advanced  ADL?s: ?OT    ?   ?Transfers: ?PT Bed Mobility, Bed to Chair, Car  ?OT Toilet, Tub/Shower  ? ?  Locomotion: ?PT Ambulation, Stairs  ? ?  Additional Impairments: ?OT Fuctional Use of Upper Extremity  ?SLP   ?  ?   ?TR    ? ? ?Anticipated Outcomes ?Item Anticipated Outcome  ?Self Feeding mod I  ?Swallowing ?   ?  ?Basic self-care ? mod I- supervision  ?Toileting ? mod I ?  ?Bathroom Transfers mod I  ?Bowel/Bladder ? manage bowel w mod I  ?Transfers ? mod I with LRAD  ?Locomotion ? supervision with LRAD  ?Communication ?    ?Cognition ?    ?Pain ? pain at or below level 4 with prns  ?Safety/Judgment ? maintain w cues  ? ?Therapy Plan: ?PT Intensity: Minimum of 1-2 x/day ,45 to 90 minutes ?PT Frequency: 5 out of 7 days ?PT Duration Estimated Length of Stay: 7-9 days ?OT Intensity: Minimum of 1-2 x/day, 45 to 90 minutes ?OT Frequency: 5 out of 7 days ?OT Duration/Estimated Length of Stay: 7-9 days ?   ? ?Due to the current state of emergency, patients may not be receiving their 3-hours of Medicare-mandated therapy. ? ? Team Interventions: ?Nursing Interventions Disease Management/Prevention, Medication Management, Discharge Planning, Pain Management, Bowel Management, Patient/Family Education  ?PT interventions Ambulation/gait training, Cognitive remediation/compensation, Discharge planning, DME/adaptive equipment instruction, Functional mobility training, Pain management, Psychosocial support,  Splinting/orthotics, Therapeutic Activities, UE/LE Strength taining/ROM, Visual/perceptual remediation/compensation, Wheelchair propulsion/positioning, UE/LE Coordination activities, Stair training, Therapeutic Exercise, Patient/family education, Functional electrical stimulation, Neuromuscular re-education, Disease management/prevention, Firefighter, Warden/ranger  ?OT Interventions Balance/vestibular training, Discharge planning, Functional electrical stimulation, Pain management, Self Care/advanced ADL retraining, Therapeutic Activities, UE/LE Coordination activities, Cognitive remediation/compensation, Disease mangement/prevention, Patient/family education, Functional mobility training, Skin care/wound managment, Therapeutic Exercise, Visual/perceptual remediation/compensation, Community reintegration, DME/adaptive equipment instruction, Neuromuscular re-education, Psychosocial support, Splinting/orthotics, UE/LE Strength taining/ROM, Wheelchair propulsion/positioning  ?SLP Interventions    ?TR Interventions    ?SW/CM Interventions Discharge Planning, Psychosocial Support, Patient/Family Education  ? ?Barriers to Discharge ?MD  Medical stability  ?Nursing Home environment access/layout, Decreased caregiver support ?multi level 6 ste right rail w son to assist  ?PT Home environment access/layout, Decreased caregiver support, Lack of/limited family support, Insurance for SNF coverage ?   ?OT   ?   ?SLP   ?   ?SW Insurance for SNF coverage, Other (comments) ?recent loss of her husband unsure when the funeral is  ? ?Team Discharge Planning: ?Destination: PT-Home ,OT- Home , SLP-Home ?Projected Follow-up: PT-Home health PT, 24 hour supervision/assistance, OT-  24 hour supervision/assistance, SLP-None ?Projected Equipment Needs: PT-To be determined, OT- To be determined, SLP-None recommended by SLP ?Equipment Details: PT- , OT-  ?Patient/family involved in discharge planning: PT- Patient,   OT-Patient, Family member/caregiver, SLP-Patient ? ?MD ELOS: 8 days ?Medical Rehab Prognosis:  Excellent ?Assessment: The patient has been admitted for CIR therapies with the diagnosis of left corona radiata  infarction. The team will be addressing functional mobility, strength, stamina, balance, safety, adaptive techniques and equipment, self-care, bowel and bladder mgt, patient and caregiver education. Goals have been set at supervision/modI. Anticipated discharge destination is home. ?  ? ? ?See Team Conference Notes for weekly updates to the plan of care  ?

## 2021-10-20 NOTE — Progress Notes (Signed)
Physical Therapy Session Note ? ?Patient Details  ?Name: Leslie Kim ?MRN: [id number] ?Date of Birth: 1936-04-26 ? ?Today's Date: 10/20/2021 ?PT Individual Time: 5501-5868 ?PT Individual Time Calculation (min): 75 min  ? ?Short Term Goals: ?Week 1:  PT Short Term Goal 1 (Week 1): STG = LTG ? ?Skilled Therapeutic Interventions/Progress Updates:  ?   ? ?Pt sitting in recliner to start - agreeable to PT tx. Denies pain. Stand<>pivot transfer completed with CGA and no AD from recliner to w/c. Transported in w/c to main rehab gym for energy conservation.  ? ?Gait training 2x172ft (seated rest) with RW and close supervision in hallways - gait speed decreased and cues needed or keeping body within walker frame - pt reporting some RLE weakness but no evidence of buckling or hyperextension.  ? ?Instructed in alternating toe taps to 4inch block with RW support and 2.5# ankle weights, supervision assist - 2x20 with seated rest breaks.  ? ?Completed alternating step ups to 4inch block with RW support and 2.5# ankle weights, light CGA provided for safety - 2x15 with seated rest breaks. ? ?Instructed in weighted sit<>stands + shldr press combination with 3.3lb med ball, CGA provided for safety - 2x5 with seated rest breaks ? ?Completed a few of the OTAGO  level 'B' exercises - backwards walking, semi-tandem stance, figure-8 walking, eyes closed walking, and knee bends. Pt required light minA for semi-tandem without UE support and minA for dynamic walking with no AD due to unsteadiness and short shuffling steps. Pt expressing being fearful of falling while ambulating with no AD. ? ?Transported back to her room in w/c for energy conservation. Stand>pivot transfer to recliner with RW and supervision with VC for hand placement. Concluded session seated in recliner with chair alarm on, all personal items within reach,call bell in lap. ? ? ? ?Therapy Documentation ?Precautions:  ?Precautions ?Precautions: Fall ?Precaution Comments:  mild R hemi ?Restrictions ?Weight Bearing Restrictions: No ?General: ?  ? ?Therapy/Group: Individual Therapy ? ?Sintia Mckissic P Hisao Doo ?10/20/2021, 7:32 AM  ?

## 2021-10-20 NOTE — Progress Notes (Signed)
?                                                       PROGRESS NOTE ? ? ?Subjective/Complaints: ?No new complaints this morning ?She is feeling much better after having multiple BM yesterday ?Creatinine is still uptrending, discontinue Lasix and repeat tomorrow ? ?ROS: constipation resolved, insomnia due to constipation resolved ? ? ?Objective: ?  ?No results found. ?Recent Labs  ?  10/18/21 ?[id number]  ?WBC 10.3  ?HGB 10.9*  ?HCT 34.0*  ?PLT 224  ? ?Recent Labs  ?  10/19/21 ?0600 10/20/21 ?0552  ?NA 134* 134*  ?K 4.3 4.3  ?CL 100 100  ?CO2 23 23  ?GLUCOSE 176* 179*  ?BUN 34* 34*  ?CREATININE 1.53* 1.70*  ?CALCIUM 9.6 9.4  ? ? ?Intake/Output Summary (Last 24 hours) at 10/20/2021 1020 ?Last data filed at 10/20/2021 0745 ?Gross per 24 hour  ?Intake 480 ml  ?Output --  ?Net 480 ml  ?  ? ?  ? ?Physical Exam: ?Vital Signs ?Blood pressure 128/69, pulse 73, temperature 98.3 ?F (36.8 ?C), resp. rate 17, height 5\' 3"  (1.6 m), weight 73.6 kg, SpO2 91 %. ?Gen: no distress, normal appearing, BMI 28.74 ?HEENT: oral mucosa pink and moist, NCAT ?Cardio: Reg rate ?Chest: tachypneic ?Abd: soft, non-distended ?Ext: no edema ?Psych: pleasant, normal affect ?Skin: intact ?Neurological:  ?   Mental Status: She is alert.  ?   Comments: Alert and oriented x 3. Normal insight and awareness. Intact Memory. Normal language and speech. Cranial nerve exam unremarkable except for mild right central 7. RUE 4-/5 prox to distal with +PD and decreased [place] with FTN. RLE 4 to 4+/5 prox to distal. LUE and LLE 5/5. Sensory exam normal for light touch and pain in all 4 limbs. No limb ataxia or cerebellar signs. No abnormal tone appreciated.    ?  ? ? ?Assessment/Plan: ?1. Functional deficits which require 3+ hours per day of interdisciplinary therapy in a comprehensive inpatient rehab setting. ?Physiatrist is providing close team supervision and 24 hour management of active medical problems listed below. ?Physiatrist and rehab team continue to assess  barriers to discharge/monitor patient progress toward functional and medical goals ? ?Care Tool: ? ?Bathing ?   ?   ?   ?  ?  ?Bathing assist Assist Level: Contact Guard/Touching assist ?  ?  ?Upper Body Dressing/Undressing ?Upper body dressing   ?What is the patient wearing?:  (pt own pjs) ?   ?Upper body assist Assist Level: Supervision/Verbal cueing ?   ?Lower Body Dressing/Undressing ?Lower body dressing ? ? ?   ?What is the patient wearing?:  (pt own pjs) ? ?  ? ?Lower body assist Assist for lower body dressing: Supervision/Verbal cueing ?   ? ?Toileting ?Toileting    ?Toileting assist Assist for toileting: Independent with assistive device ?Assistive Device Comment: walker ?  ?Transfers ?Chair/bed transfer ? ?Transfers assist ?   ? ?Chair/bed transfer assist level: Supervision/Verbal cueing ?  ?  ?Locomotion ?Ambulation ? ? ?Ambulation assist ? ?   ? ?Assist level: Contact Guard/Touching assist ?Assistive device: Walker-rolling ?Max distance: 141ft  ? ?Walk 10 feet activity ? ? ?Assist ?   ? ?Assist level: Contact Guard/Touching assist ?Assistive device: Walker-rolling  ? ?Walk 50 feet activity ? ? ?Assist   ? ?Assist level:  Contact Guard/Touching assist ?Assistive device: Walker-rolling  ? ? ?Walk 150 feet activity ? ? ?Assist   ? ?Assist level: Contact Guard/Touching assist ?Assistive device: Walker-rolling ?  ? ?Walk 10 feet on uneven surface  ?activity ? ? ?Assist   ? ? ?Assist level: Minimal Assistance - Patient > 75% ?Assistive device: Walker-rolling  ? ?Wheelchair ? ? ? ? ?Assist Is the patient using a wheelchair?: No ?  ?  ? ?  ?   ? ? ?Wheelchair 50 feet with 2 turns activity ? ? ? ?Assist ? ?  ?  ? ? ?   ? ?Wheelchair 150 feet activity  ? ? ? ?Assist ?   ? ? ?   ? ?Blood pressure 128/69, pulse 73, temperature 98.3 ?F (36.8 ?C), resp. rate 17, height 5\' 3"  (1.6 m), weight 73.6 kg, SpO2 91 %. ? ?Medical Problem List and Plan: ?1. Functional deficits secondary to left corona radiata infarction with  right HP. ?            -patient may shower ?            -ELOS/Goals: 8 days, supervision to [employer] with PT, OT, SLP ? HFU scheduled ? Discussed her excellent progress with her.  ?2.  Antithrombotics: ?-DVT/anticoagulation:  Pharmaceutical: Other (comment) ELIQUIS ?            -antiplatelet therapy: N/A ?3. Pain Management: Tylenol as needed ?4. Mood: Melatonin 6 mg nightly ?            -antipsychotic agents: N/A ?5. Neuropsych: This patient is capable of making decisions on her own behalf. ?6. Skin/Wound Care: Routine skin checks ?7. Fluids/Electrolytes/Nutrition: Routine in and out with follow-up chemistries ?8.  Diastolic congestive heart failure.  Lasix 40 mg daily.  Monitor for signs of fluid overload ?            -check weights daily ?9.  Hyperlipidemia.  Lipitor ?10.  Atrial fibrillation.  Continue Eliquis.  Cardiac rate controlled ?11.  Hypertension.  Coreg 6.25 mg twice daily, Cozaar 50 mg twice daily, Aldactone 25 mg daily.    ?            -monitor for control with increased physical activity.  ?12.  Diabetes mellitus type 2.  Hemoglobin A1c 6.3.   ?-Currently with SSI. Adjust regimen as needed. ?13.  COPD.  Quit smoking 13 years ago.  Continue inhalers as directed. ?14.  GERD.  Protonix ?15. Iron deficiency anemia: continue iron supplement ?16. Suboptimal B12: B12 checked and improved to 509 ?17. AKI: worsening, discontinue Lasix and repeat creatinine tomorrow.  ?18. Constipation: 36mL milk of magnesia given 3/22 with resolution of constipation that day.  ? ?LOS: ?3 days ?A FACE TO FACE EVALUATION WAS PERFORMED ? ?Martha Clan P Deeksha Cotrell ?10/20/2021, 10:20 AM  ? ?  ?

## 2021-10-21 LAB — BASIC METABOLIC PANEL
Anion gap: 9 (ref 5–15)
BUN: 35 mg/dL — ABNORMAL HIGH (ref 8–23)
CO2: 22 mmol/L (ref 22–32)
Calcium: 9.5 mg/dL (ref 8.9–10.3)
Chloride: 100 mmol/L (ref 98–111)
Creatinine, Ser: 1.58 mg/dL — ABNORMAL HIGH (ref 0.44–1.00)
GFR, Estimated: 32 mL/min — ABNORMAL LOW (ref >60)
Glucose, Bld: 185 mg/dL — ABNORMAL HIGH (ref 70–99)
Potassium: 4.1 mmol/L (ref 3.5–5.1)
Sodium: 131 mmol/L — ABNORMAL LOW (ref 135–145)

## 2021-10-21 LAB — GLUCOSE, CAPILLARY
Glucose-Capillary: 190 mg/dL — ABNORMAL HIGH (ref 70–99)
Glucose-Capillary: 179 mg/dL — ABNORMAL HIGH (ref 70–99)
Glucose-Capillary: 174 mg/dL — ABNORMAL HIGH (ref 70–99)
Glucose-Capillary: 149 mg/dL — ABNORMAL HIGH (ref 70–99)
Glucose-Capillary: 152 mg/dL — ABNORMAL HIGH (ref 70–99)

## 2021-10-21 MED ORDER — FUROSEMIDE 20 MG PO TABS
10.0000 mg | ORAL_TABLET | Freq: Once | ORAL | Status: AC
  Administered 2021-10-21: 10 mg via ORAL
  Filled 2021-10-21: qty 1

## 2021-10-21 NOTE — Progress Notes (Signed)
?                                                       PROGRESS NOTE ? ? ?Subjective/Complaints: ?No new complaints this morning ?Cr function improved off Lasix. Weight slightly up yesterday, will ask RN to weigh again today ? ?ROS: constipation resolved, insomnia due to constipation resolved, denies pain ? ? ?Objective: ?  ?No results found. ?No results for input(s): WBC, HGB, HCT, PLT in the last 72 hours. ? ?Recent Labs  ?  10/20/21 ?[id number] 10/21/21 ?0541  ?NA 134* 131*  ?K 4.3 4.1  ?CL 100 100  ?CO2 23 22  ?GLUCOSE 179* 185*  ?BUN 34* 35*  ?CREATININE 1.70* 1.58*  ?CALCIUM 9.4 9.5  ? ? ?Intake/Output Summary (Last 24 hours) at 10/21/2021 1239 ?Last data filed at 10/21/2021 0730 ?Gross per 24 hour  ?Intake 580 ml  ?Output --  ?Net 580 ml  ?  ? ?  ? ?Physical Exam: ?Vital Signs ?Blood pressure (!) 158/62, pulse 78, temperature 98.3 ?F (36.8 ?C), resp. rate 17, height 5\' 3"  (1.6 m), weight 73.6 kg, SpO2 96 %. ?Gen: no distress, normal appearing, BMI 28.74 ?HEENT: oral mucosa pink and moist, NCAT ?Cardio: Reg rate ?Chest: normal breathing ?Abd: soft, non-distended ?Ext: no edema ?Psych: pleasant, normal affect ?Skin: intact ?Neurological:  ?   Mental Status: She is alert.  ?   Comments: Alert and oriented x 3. Follows commands very well. Normal insight and awareness. Intact Memory. Normal language and speech. Cranial nerve exam unremarkable except for mild right central 7. RUE 4-/5 prox to distal with +PD and decreased [place] with FTN. RLE 4 to 4+/5 prox to distal. LUE and LLE 5/5. Sensory exam normal for light touch and pain in all 4 limbs. No limb ataxia or cerebellar signs. No abnormal tone appreciated.    ?  ? ? ?Assessment/Plan: ?1. Functional deficits which require 3+ hours per day of interdisciplinary therapy in a comprehensive inpatient rehab setting. ?Physiatrist is providing close team supervision and 24 hour management of active medical problems listed below. ?Physiatrist and rehab team continue to assess  barriers to discharge/monitor patient progress toward functional and medical goals ? ?Care Tool: ? ?Bathing ?   ?   ?   ?  ?  ?Bathing assist Assist Level: Contact Guard/Touching assist ?  ?  ?Upper Body Dressing/Undressing ?Upper body dressing   ?What is the patient wearing?:  (pt own pjs) ?   ?Upper body assist Assist Level: Supervision/Verbal cueing ?   ?Lower Body Dressing/Undressing ?Lower body dressing ? ? ?   ?What is the patient wearing?:  (pt own pjs) ? ?  ? ?Lower body assist Assist for lower body dressing: Supervision/Verbal cueing ?   ? ?Toileting ?Toileting    ?Toileting assist Assist for toileting: Independent with assistive device ?Assistive Device Comment: walker ?  ?Transfers ?Chair/bed transfer ? ?Transfers assist ?   ? ?Chair/bed transfer assist level: Supervision/Verbal cueing ?  ?  ?Locomotion ?Ambulation ? ? ?Ambulation assist ? ?   ? ?Assist level: Contact Guard/Touching assist ?Assistive device: Walker-rolling ?Max distance: 128ft  ? ?Walk 10 feet activity ? ? ?Assist ?   ? ?Assist level: Contact Guard/Touching assist ?Assistive device: Walker-rolling  ? ?Walk 50 feet activity ? ? ?Assist   ? ?Assist level: Contact Guard/Touching  assist ?Assistive device: Walker-rolling  ? ? ?Walk 150 feet activity ? ? ?Assist   ? ?Assist level: Contact Guard/Touching assist ?Assistive device: Walker-rolling ?  ? ?Walk 10 feet on uneven surface  ?activity ? ? ?Assist   ? ? ?Assist level: Minimal Assistance - Patient > 75% ?Assistive device: Walker-rolling  ? ?Wheelchair ? ? ? ? ?Assist Is the patient using a wheelchair?: No ?  ?  ? ?  ?   ? ? ?Wheelchair 50 feet with 2 turns activity ? ? ? ?Assist ? ?  ?  ? ? ?   ? ?Wheelchair 150 feet activity  ? ? ? ?Assist ?   ? ? ?   ? ?Blood pressure (!) 158/62, pulse 78, temperature 98.3 ?F (36.8 ?C), resp. rate 17, height 5\' 3"  (1.6 m), weight 73.6 kg, SpO2 96 %. ? ?Medical Problem List and Plan: ?1. Functional deficits secondary to left corona radiata infarction with  right HP. ?            -patient may shower ?            -ELOS/Goals: 8 days, supervision to [employer] with PT, OT, SLP ? HFU scheduled ? Discussed her excellent progress with her.  ? Discussed d/c date with her and she is content with this ?2.  Antithrombotics: ?-DVT/anticoagulation:  Pharmaceutical: Other (comment) ELIQUIS ?            -antiplatelet therapy: N/A ?3. Pain Management: Tylenol as needed ?4. Mood: Melatonin 6 mg nightly ?            -antipsychotic agents: N/A ?5. Neuropsych: This patient is capable of making decisions on her own behalf. ?6. Skin/Wound Care: Routine skin checks ?7. Fluids/Electrolytes/Nutrition: Routine in and out with follow-up chemistries ?8.  Diastolic congestive heart failure.  Lasix 40 mg daily.  Monitor for signs of fluid overload ?            -check weights daily, asked RN to recheck today, up slightly yesterday and creatinine improved. Given 10 Lasix today ?9.  Hyperlipidemia.  Lipitor ?10.  Atrial fibrillation.  Continue Eliquis.  Cardiac rate controlled ?11.  Hypertension.  Coreg 6.25 mg twice daily, Cozaar 50 mg twice daily, Aldactone 25 mg daily.  Lasix 10mg  once today.  ?            -monitor for control with increased physical activity.  ?12.  Diabetes mellitus type 2.  Hemoglobin A1c 6.3.   ?-Currently with SSI. Adjust regimen as needed. ?13.  COPD.  Quit smoking 13 years ago.  Continue inhalers as directed. ?14.  GERD.  Protonix ?15. Iron deficiency anemia: continue iron supplement ?16. Suboptimal B12: B12 checked and improved to 509 ?17. AKI: improving, 10mg  lasix today, repeat creatinine tomorrow.  ?18. Constipation: 68mL milk of magnesia given 3/22 with resolution of constipation that day.  ? ?LOS: ?4 days ?A FACE TO FACE EVALUATION WAS PERFORMED ? ?Martha Clan P Duglas Heier ?10/21/2021, 12:39 PM  ? ?  ?

## 2021-10-21 NOTE — Progress Notes (Signed)
Occupational Therapy Session Note ? ?Patient Details  ?Name: Leslie Kim ?MRN: [id number] ?Date of Birth: 06-Jan-1936 ? ?Today's Date: 10/21/2021 ?OT Individual Time: 2119-4174 ?OT Individual Time Calculation (min): 24 min  ? ? ?Short Term Goals: ?Week 1:  OT Short Term Goal 1 (Week 1): STGs=LTGs due to ELOS ? ?Skilled Therapeutic Interventions/Progress Updates:  ?  1:1. Pt received in recliner agreeable ot OT with no pain reported. Pt completes functional mobility to/from ADL apartment with S-CGA increasing A during dual task- walking while adjusting mask with minor LOB/scissoring feet. Pt completes kitchen search into various height cabinets/appliaces in prep for IADL retraining from ambulatory level with RW and MIN cuing for safety/positioning throughout environment. Pt uses walker tray as AE to improve reach/safety and transportation of items with education on energy conservation techniques throughout activity ? ?Exited session with pt seated in recliner, exit alarm on and call light in reach ? ? ?Therapy Documentation ?Precautions:  ?Precautions ?Precautions: Fall ?Precaution Comments: mild R hemi ?Restrictions ?Weight Bearing Restrictions: No ?General: ?  ? ? ?Therapy/Group: Individual Therapy ? ?Leslie Kim ?10/21/2021, 1:26 PM ?

## 2021-10-21 NOTE — Progress Notes (Signed)
Occupational Therapy Session Note ? ?Patient Details  ?Name: Leslie Kim ?MRN: [id number] ?Date of Birth: 06-06-1936 ? ?Today's Date: 10/21/2021 ?OT Individual Time: [phone] ?  OT Individual Time Calculation (min): 55 min  ? ? ?Short Term Goals: ?Week 1:  OT Short Term Goal 1 (Week 1): STGs=LTGs due to ELOS ? ?Skilled Therapeutic Interventions/Progress Updates:  ?  Pt sitting up at EOB, c/o discomfort due to feelings of constipation.  MD just leaving and notified this therapist that pt will be taking Milk of Magnesia during session to address this.  Pt agreeable to [facility] session and completed at tabletop sitting EOB.  Educated pt on various FMC tasks to promote finger<>palm in hand manipulation and precision pinch and place using right dominant hand using different small shaped items.  Then educated pt and allowed for training on handwriting tasks including fluid blocked practice signature and cursive letters.  Pt exhibited slight improvement in precision and fluidity of handwriting after repetition and cues.  Call bell in reach, bed alarm on. ? ?Therapy Documentation ?Precautions:  ?Precautions ?Precautions: Fall ?Precaution Comments: mild R hemi ?Restrictions ?Weight Bearing Restrictions: No ? ? ? ?Therapy/Group: Individual Therapy ? ?Dian Situ Nissa Stannard ?10/21/2021, 7:08 AM ?

## 2021-10-21 NOTE — Progress Notes (Signed)
Occupational Therapy Session Note ? ?Patient Details  ?Name: Leslie Kim ?MRN: [id number] ?Date of Birth: April 23, 1936 ? ?Today's Date: 10/21/2021 ?OT Individual Time: 3762-8315 ?OT Individual Time Calculation (min): 57 min  ? ? ?Short Term Goals: ?Week 1:  OT Short Term Goal 1 (Week 1): STGs=LTGs due to ELOS ? ?Skilled Therapeutic Interventions/Progress Updates:  ?  Pt sitting up in recliner, requesting to shower due to recent episode of loose stool.  Pt completed all self care and functional mobility with distant supervision with occasional verbal cues for safety. Sit to stand and ambulated to bathroom using RW, toilet transfer and toileting (continent of urine), ambulated to shower bench, doffed shirt, underwear, pants, socks.  Bathed UB/LB at sit<>stand, dried off and ambulated using RW to recliner, donned shirt, underwear, pants, grip socks.  Pt then requesting to go outside due to sunny day.  Pt ambulated ~ 200 feet including elevator negotiation using RW with close supervision.  Pt self propelled using BUE to increase endurance and BUE strength to outside porch ~50 feet with supervision.  Transported back to room via w/c, call bell in reach, seat alarm on. ? ?Therapy Documentation ?Precautions:  ?Precautions ?Precautions: Fall ?Precaution Comments: mild R hemi ?Restrictions ?Weight Bearing Restrictions: No ? ? ? ?Therapy/Group: Individual Therapy ? ?Dian Situ Allison Silva ?10/21/2021, 3:04 PM ?

## 2021-10-21 NOTE — Progress Notes (Signed)
Physical Therapy Session Note ? ?Patient Details  ?Name: Leslie Kim ?MRN: [id number] ?Date of Birth: 09/20/1935 ? ?Today's Date: 10/21/2021 ?PT Individual Time: 1331-1430 ?PT Individual Time Calculation (min): 59 min  ? ?Short Term Goals: ?Week 1:  PT Short Term Goal 1 (Week 1): STG = LTG ? ?Skilled Therapeutic Interventions/Progress Updates: Pt presents sitting in recliner and agreeable to therapy.  Pt transfers sit to stand w/ CGA and performed SPT to w/c.  Pt wheeled to [address] tower for change of scenery and sunshine/heat for therapy.  Pt amb multiple trials w/ RW x 150' including turn to return to seat.  Pt does require verbal cues for walker management, especially w/ turns.  Pt amb x 90' w/ HHA or w/o D and CGA including turns.  Pt negotiated 8 steps w/ 1 Hand rail and CGA, step to gait pattern for safety and energy conservation.  Pt performed standing toe taps to 6" platform w/o UE support and CGA to light min, 1 LOB.  Pt performed standing balance on [employer] cushion w/ CGA, performing standing w/ eyes open/closed and then performing shoulder flexion w/ noted posterior bias, pt able to correct w/ verbal cues.  Pt amb back to room and returned to bed.  Pt transfers sit to supine w/ supervision w/ self elevation of HOB.  Bed alarm on and all needs in reach. ?   ? ?Therapy Documentation ?Precautions:  ?Precautions ?Precautions: Fall ?Precaution Comments: mild R hemi ?Restrictions ?Weight Bearing Restrictions: No ?General: ?  ?Vital Signs: ? ?Pain:0/10 ? ? ? ? ?Therapy/Group: Individual Therapy ? ?Lucio Edward ?10/21/2021, 2:30 PM  ?

## 2021-10-22 DIAGNOSIS — N179 Acute kidney failure, unspecified: Secondary | ICD-10-CM

## 2021-10-22 LAB — GLUCOSE, CAPILLARY
Glucose-Capillary: 176 mg/dL — ABNORMAL HIGH (ref 70–99)
Glucose-Capillary: 160 mg/dL — ABNORMAL HIGH (ref 70–99)
Glucose-Capillary: 281 mg/dL — ABNORMAL HIGH (ref 70–99)
Glucose-Capillary: 115 mg/dL — ABNORMAL HIGH (ref 70–99)

## 2021-10-22 NOTE — Progress Notes (Signed)
Physical Therapy Session Note ? ?Patient Details  ?Name: Leslie Kim ?MRN: [id number] ?Date of Birth: 05-20-1936 ? ?Today's Date: 10/22/2021 ?PT Individual Time: 6629-4765; 4650-3546 ?PT Individual Time Calculation (min): 57, 54 min  ? ?Short Term Goals: ?Week 1:  PT Short Term Goal 1 (Week 1): STG = LTG ? ?Skilled Therapeutic Interventions/Progress Updates:  ? ? Tx 1: ? ?Pt finishing up toileting with RN in room.  Pt continent of B and B. Peri care with supervision.  Sit> stand to RW and clothing mgt with close supervision.  Gait in room to sink for hand washing with supervision, in standng, RW, close supervison.  ? ?No c/o pain.  Simulated car transfer to sedan ht seat, close supervision with min cues, RW.  Stand pivot transfer without use of RW, close supervision with cues for hand placement. ? ?Instructed pt in self -stretching in sitting with use of footstool, using strap x 30 seconds.  Pt c/o shoulder pain R when pulling on strap, so method changed to show AROM R and L ankle DF with extended knee. ? ?neuromuscular re-education via forced use of R hand, multimodal cues for fine motor task of R hand in sitting, while rotating trunk to retrieve small items; reciprocal scooting forward/backward in sitting for pelvic dissociation. Balance challenge and sustained stretch bil hamstrings and heel cords, standing iwht forefeet on blue wedge x 3 minutes, x(1 min with bil UE support, x 2 min without UE support).  Pt demonstrated frequent mild postural sway backwards with independent recovery. Seated bil scapular retraction to promote trunk extension and cervical alignment. ? ?Gait training on level tile, RW, x 150' with close supervision/CGA.  VCs to increase speed resulted in minimal increase.   ? ?At end of session, pt resting in recliner with needs at hand and seat pad alarm set. ? ?Tx 2: ? ?Pt awakening from a nap; she denied pain.   ? ?neuromuscular re-education via demo, multimodal cues for supine: bil adductor  squeezes with bil bridging , R straight leg raises with focus on eccentric control.  In standing with UE support: heel raises with R or LUE sliding up cabinet overhead, R/L hamstring curls,  bil toe raises.  Pt required brief seated rest breaks q set. ? ?Supine> sit to R with supervision.  Pt reported need to urinate.  Sit> stand with close supervision.  Toilet transfer and clothing mgt with close supervision; continent of bladder.  Peri care as above. Gait in congested room wtith RW to toilet and sink, close supervision /CGA. Hand washing as above. ? ?Up/down (8) 6" high steps, R rail, CGA with 1-2 hands on rail.  Step- through method when ascending, step-to method when descending, with 1 cue to lead with R foot.  No unsteadiness or R knee buckling. Up/down 8" high curb/step with RW, CGA, mod cues. ? ?At end of session, pt seated in recliner with needs at hand and seat pad alarm set. ?   ? ?Therapy Documentation ?Precautions:  ?Precautions ?Precautions: Fall ?Precaution Comments: mild R hemi ?Restrictions ?Weight Bearing Restrictions: No ? ? ? ? ?Therapy/Group: Individual Therapy ? ?[name] ?10/22/2021, 4:44 PM  ?

## 2021-10-22 NOTE — Progress Notes (Signed)
?                                                       PROGRESS NOTE ? ? ?Subjective/Complaints: ?Pt without new issues. Feels well. Sleeping well. Up with therapy walking in the hall later after I saw her. Seems to be tolerating ? ?ROS: Patient denies fever, rash, sore throat, blurred vision, dizziness, nausea, vomiting, diarrhea, cough, shortness of breath or chest pain, joint or back/neck pain, headache, or mood change.  ? ?Objective: ?  ?No results found. ?No results for input(s): WBC, HGB, HCT, PLT in the last 72 hours. ? ?Recent Labs  ?  10/20/21 ?[id number] 10/21/21 ?0541  ?NA 134* 131*  ?K 4.3 4.1  ?CL 100 100  ?CO2 23 22  ?GLUCOSE 179* 185*  ?BUN 34* 35*  ?CREATININE 1.70* 1.58*  ?CALCIUM 9.4 9.5  ? ? ?Intake/Output Summary (Last 24 hours) at 10/22/2021 0943 ?Last data filed at 10/21/2021 2107 ?Gross per 24 hour  ?Intake 460 ml  ?Output --  ?Net 460 ml  ?  ? ?  ? ?Physical Exam: ?Vital Signs ?Blood pressure 139/61, pulse 72, temperature 98.6 ?F (37 ?C), temperature source Oral, resp. rate 18, height 5\' 3"  (1.6 m), weight 71.8 kg, SpO2 96 %. ?Constitutional: No distress . Vital signs reviewed. ?HEENT: NCAT, EOMI, oral membranes moist ?Neck: supple ?Cardiovascular: RRR without murmur. No JVD    ?Respiratory/Chest: CTA Bilaterally without wheezes or rales. Normal effort    ?GI/Abdomen: BS +, non-tender, non-distended ?Ext: no clubbing, cyanosis, or edema ?Psych: pleasant and cooperative  ?Skin: intact ?Neurological:  ?   Mental Status: She is alert.  ?   Comments: Alert and oriented x 3. Follows commands very well. Normal insight and awareness. Intact Memory. Normal language and speech. Cranial nerve exam unremarkable except for mild right central 7. RUE 4-/5 with decreased [place]. RLE 4+/5 prox to distal. LUE and LLE 5/5. Sensory exam normal for light touch and pain in all 4 limbs. No limb ataxia or cerebellar signs. No abnormal tone appreciated.   ?  ? ? ?Assessment/Plan: ?1. Functional deficits which require 3+  hours per day of interdisciplinary therapy in a comprehensive inpatient rehab setting. ?Physiatrist is providing close team supervision and 24 hour management of active medical problems listed below. ?Physiatrist and rehab team continue to assess barriers to discharge/monitor patient progress toward functional and medical goals ? ?Care Tool: ? ?Bathing ?   ?   ?   ?  ?  ?Bathing assist Assist Level: Contact Guard/Touching assist ?  ?  ?Upper Body Dressing/Undressing ?Upper body dressing   ?What is the patient wearing?:  (pt own pjs) ?   ?Upper body assist Assist Level: Supervision/Verbal cueing ?   ?Lower Body Dressing/Undressing ?Lower body dressing ? ? ?   ?What is the patient wearing?:  (pt own pjs) ? ?  ? ?Lower body assist Assist for lower body dressing: Supervision/Verbal cueing ?   ? ?Toileting ?Toileting    ?Toileting assist Assist for toileting: Independent with assistive device ?Assistive Device Comment: walker ?  ?Transfers ?Chair/bed transfer ? ?Transfers assist ?   ? ?Chair/bed transfer assist level: Supervision/Verbal cueing ?  ?  ?Locomotion ?Ambulation ? ? ?Ambulation assist ? ?   ? ?Assist level: Contact Guard/Touching assist ?Assistive device: Walker-rolling ?Max distance: 176ft  ? ?  Walk 10 feet activity ? ? ?Assist ?   ? ?Assist level: Contact Guard/Touching assist ?Assistive device: Walker-rolling  ? ?Walk 50 feet activity ? ? ?Assist   ? ?Assist level: Contact Guard/Touching assist ?Assistive device: Walker-rolling  ? ? ?Walk 150 feet activity ? ? ?Assist   ? ?Assist level: Contact Guard/Touching assist ?Assistive device: Walker-rolling ?  ? ?Walk 10 feet on uneven surface  ?activity ? ? ?Assist   ? ? ?Assist level: Minimal Assistance - Patient > 75% ?Assistive device: Walker-rolling  ? ?Wheelchair ? ? ? ? ?Assist Is the patient using a wheelchair?: No ?  ?  ? ?  ?   ? ? ?Wheelchair 50 feet with 2 turns activity ? ? ? ?Assist ? ?  ?  ? ? ?   ? ?Wheelchair 150 feet activity  ? ? ? ?Assist ?    ? ? ?   ? ?Blood pressure 139/61, pulse 72, temperature 98.6 ?F (37 ?C), temperature source Oral, resp. rate 18, height 5\' 3"  (1.6 m), weight 71.8 kg, SpO2 96 %. ? ?Medical Problem List and Plan: ?1. Functional deficits secondary to left corona radiata infarction with right HP. ?            -patient may shower ?            -ELOS/Goals: 8 days, supervision to [employer] with PT, OT, SLP ? HFU scheduled ? -Continue CIR therapies including PT, OT, and SLP  ?2.  Antithrombotics: ?-DVT/anticoagulation:  Pharmaceutical: Other (comment) ELIQUIS ?            -antiplatelet therapy: N/A ?3. Pain Management: Tylenol as needed ?4. Mood: Melatonin 6 mg nightly ?            -antipsychotic agents: N/A ?5. Neuropsych: This patient is capable of making decisions on her own behalf. ?6. Skin/Wound Care: Routine skin checks ?7. Fluids/Electrolytes/Nutrition: Routine in and out with follow-up chemistries ?8.  Diastolic congestive heart failure.  Lasix 40 mg daily.  Monitor for signs of fluid overload ?            -check weights daily--trending down since 3/23 ? -diuretics on hold per #17 ?Filed Weights  ? 10/20/21 0600 10/21/21 1700 10/22/21 0457  ?Weight: 73.6 kg 71.8 kg 71.8 kg  ?  ?9.  Hyperlipidemia.  Lipitor ?10.  Atrial fibrillation.  Continue Eliquis.  Cardiac rate controlled ?11.  Hypertension.  Coreg 6.25 mg twice daily, Cozaar 50 mg twice daily, (Aldactone 25 mg daily and Lasix 10mg  once today- both now held).  ?            -bp controlled 3/25 ?12.  Diabetes mellitus type 2.  Hemoglobin A1c 6.3.   ?-Currently with SSI. Adjust regimen as needed. ?CBG (last 3)  ?Recent Labs  ?  10/21/21 ?1644 10/21/21 ?2110 10/22/21 ?0610  ?GLUCAP 149* 152* 176*  ? 3/25 fair control ?13.  COPD.  Quit smoking 13 years ago.  Continue inhalers as directed. ?14.  GERD.  Protonix ?15. Iron deficiency anemia: continue iron supplement ?16. Suboptimal B12: B12 checked and improved to 509 ?17. AKI: BUN/Cr still elevated over baseline 3/24 ? -encourage  adequate fluids ? -hold aldactone ? -recheck labs Monday ?18. Constipation: 33mL milk of magnesia given 3/22 with resolution of constipation that day.  ? ?LOS: ?5 days ?A FACE TO FACE EVALUATION WAS PERFORMED ? ?Meredith Staggers ?10/22/2021, 9:43 AM  ? ?  ?

## 2021-10-22 NOTE — Progress Notes (Signed)
Occupational Therapy Session Note ? ?Patient Details  ?Name: Leslie Kim ?MRN: [id number] ?Date of Birth: 07/21/1936 ? ?Today's Date: 10/22/2021 ?OT Individual Time: 6664-8616 ?OT Individual Time Calculation (min): 47 min  ? ? ?Short Term Goals: ?Week 1:  OT Short Term Goal 1 (Week 1): STGs=LTGs due to ELOS ? ?Skilled Therapeutic Interventions/Progress Updates:  ?  Pt received seated in recliner, denies pain, agreeable to therapy. Session focus on self-care retraining, activity tolerance, dynamic standing balance, transfer retraining, RUE coordination in prep for improved ADL/IADL/func mobility performance + decreased caregiver burden. Ambulatory toilet transfer with RW and close S, completed toileting tasks close S post continent void of bladder and completed hand hygiene at the same level. Ambulated to and from gym RW and close S.  ? ?Administered the Nine Hole Peg Test (NHPT)  with the following results: ?-R:35 seconds ?-L: 34 seconds ( 37-45 seconds is considered below average ; greater than 45 seconds is considered poor) thus pt is not demonstrating significant deficits in fine motor skills, hand dexterity, and speed. ? ?Attempted to assess B grip strength, pt unable to grip dynameter sufficiently to achieve reading.  ? ?In Maxi sky walking sling, pt participated in various obstacle courses with RW involving weaving in/out cones, stepping over cones, holding full glass of water, tossing/catching ball with BUE. Several mild LOB throughout but able to self-correct. Pt reports significant fatigue post activity.  ? ?Pt left seated in recliner with chair pad alarm engaged, call bell in reach, and all immediate needs met.  ? ? ?Therapy Documentation ?Precautions:  ?Precautions ?Precautions: Fall ?Precaution Comments: mild R hemi ?Restrictions ?Weight Bearing Restrictions: No ? ?Pain: ? denies ?ADL: See Care Tool for more details. ? ?Therapy/Group: Individual Therapy ? ?Volanda Napoleon MS, OTR/L ? ?10/22/2021, 6:48  AM ?

## 2021-10-23 LAB — GLUCOSE, CAPILLARY
Glucose-Capillary: 148 mg/dL — ABNORMAL HIGH (ref 70–99)
Glucose-Capillary: 169 mg/dL — ABNORMAL HIGH (ref 70–99)
Glucose-Capillary: 207 mg/dL — ABNORMAL HIGH (ref 70–99)
Glucose-Capillary: 157 mg/dL — ABNORMAL HIGH (ref 70–99)

## 2021-10-23 NOTE — Progress Notes (Signed)
Physical Therapy Session Note ? ?Patient Details  ?Name: Leslie Kim ?MRN: [id number] ?Date of Birth: 1936/04/25 ? ?Today's Date: 10/23/2021 ?PT Individual Time: 1308-6578 ?PT Individual Time Calculation (min): 46 min  ? ?Short Term Goals: ?Week 1:  PT Short Term Goal 1 (Week 1): STG = LTG ? ?Skilled Therapeutic Interventions/Progress Updates:  ?Pt resting in recliner.  She declined having any pain, but stated her R shoulder was a little "sore". Premedicated with Tylenol. ? ?Stand pivot transfer without AD, x 2 with supervision. ? ?Patient demonstrates increased fall risk as noted by score of  34 /56 on Berg Balance Scale.  (<36= high risk for falls, close to 100%; 37-45 significant >80%; 46-51 moderate >50%; 52-55 lower >25%).  PT discussed falls risk with pt and need to continue using RW for now.  She reported that her balance has been a factor for her x approx 5 years, when she stopped working in her garden and [place].  Given external perturbations in all directions, pt demonstrates balance strategies for in R directions consistently, and A-P directions intermittently.  She demonstrated ankle strategy more than hip strategy, and took 1 small automatic step backwards with L foot, x 1/8.  Pt appears to have large C curb scoliosis, L convexity, L shoulder higher ? ?Gait training with RW on level tile with supervision x 150'.  Up/down 8 steps, 6" high with R rail, supervision.  ? ?At end of session, pt resting in recliner with needs at hand and seat pad alarm set. ? ? ?. ?   ? ?Therapy Documentation ?Precautions:  ?Precautions ?Precautions: Fall ?Precaution Comments: mild R hemi ?Restrictions ?Weight Bearing Restrictions: No ? ?   ?Balance: ?Balance ?Balance Assessed: Yes ?Standardized Balance Assessment ?Standardized Balance Assessment: Berg Balance Test ?Leslie Kim Balance Test ?Sit to Stand: Able to stand without using hands and stabilize independently ?Standing Unsupported: Able to stand 2 minutes with  supervision ?Sitting with Back Unsupported but Feet Supported on Floor or Stool: Able to sit safely and securely 2 minutes ?Stand to Sit: Sits safely with minimal use of hands ?Transfers: Able to transfer safely, minor use of hands ?Standing Unsupported with Eyes Closed: Able to stand 10 seconds with supervision ?Standing Ubsupported with Feet Together: Able to place feet together independently and stand for 1 minute with supervision ?From Standing, Reach Forward with Outstretched Arm: Reaches forward but needs supervision ?From Standing Position, Pick up Object from Floor: Able to pick up shoe, needs supervision ?From Standing Position, Turn to Look Behind Over each Shoulder: Needs assist to keep from losing balance and falling ?Turn 360 Degrees: Needs close supervision or verbal cueing ?Standing Unsupported, Alternately Place Feet on Step/Stool: Able to complete >2 steps/needs minimal assist ?Standing Unsupported, One Foot in Front: Able to take small step independently and hold 30 seconds ?Standing on One Leg: Tries to lift leg/unable to hold 3 seconds but remains standing independently ?Total Score: 34 ?   ? ? ? ?Therapy/Group: Individual Therapy ? ?Keondre Markson ?10/23/2021, 10:20 AM  ?

## 2021-10-23 NOTE — Discharge Summary (Signed)
Physician Discharge Summary  ?Patient ID: ?Leslie Kim ?MRN: [id number] ?DOB/AGE: 03-27-1936 86 y.o. ? ?Admit date: 10/17/2021 ?Discharge date: 10/25/2021 ? ?Discharge Diagnoses:  ?Principal Problem: ?  Left middle cerebral artery stroke ([place]) ?DVT prophylaxis ?Diastolic congestive heart failure ?Atrial fibrillation ?Hypertension ?Diabetes mellitus ?COPD ?GERD ?Iron deficiency anemia ?AKI ? ?Discharged Condition: Stable ? ?Significant Diagnostic Studies: ?CT HEAD WO CONTRAST ? ?Result Date: 10/13/2021 ?CLINICAL DATA:  Neurologic deficit.  Concern for stroke. EXAM: CT HEAD WITHOUT CONTRAST TECHNIQUE: Contiguous axial images were obtained from the base of the skull through the vertex without intravenous contrast. RADIATION DOSE REDUCTION: This exam was performed according to the departmental dose-optimization program which includes automated exposure control, adjustment of the mA and/or kV according to patient size and/or use of iterative reconstruction technique. COMPARISON:  None. FINDINGS: Brain: The ventricles and sulci are appropriate size for patient's age. Mild periventricular and deep white matter chronic microvascular ischemic changes noted. There is no acute intracranial hemorrhage. No mass effect or midline shift. No extra-axial fluid collection. Vascular: No hyperdense vessel or unexpected calcification. Skull: Normal. Negative for fracture or focal lesion. Sinuses/Orbits: No acute finding. Other: None IMPRESSION: 1. No acute intracranial pathology. 2. Mild chronic microvascular ischemic changes. Electronically Signed   By: Anner Crete M.D.   On: 10/13/2021 21:08  ? ?MR ANGIO HEAD WO CONTRAST ? ?Result Date: 10/14/2021 ?CLINICAL DATA:  Neuro deficit, acute, stroke suspected; cva; right arm and leg weakness EXAM: MRI HEAD WITHOUT CONTRAST MRA HEAD WITHOUT CONTRAST MRA NECK WITHOUT AND WITH CONTRAST TECHNIQUE: Multiplanar, multi-echo pulse sequences of the brain and surrounding structures were acquired  without intravenous contrast. Angiographic images of the Circle of Willis were acquired using MRA technique without intravenous contrast. Angiographic images of the neck were acquired using MRA technique without and with intravenous contrast. Carotid stenosis measurements (when applicable) are obtained utilizing NASCET criteria, using the distal internal carotid diameter as the denominator. CONTRAST:  7 mL Gadavist COMPARISON:  None FINDINGS: MRI HEAD Brain: Small area of restricted diffusion involving the left corona radiata. There is no intracranial hemorrhage. There is no intracranial mass, mass effect, or edema. There is no hydrocephalus or extra-axial fluid collection. Prominence of the ventricles and sulci reflects minor parenchymal volume loss. Patchy and confluent areas of T2 hyperintensity in the supratentorial white matter are nonspecific but may reflect mild to moderate chronic microvascular ischemic changes. Vascular: Major vessel flow voids at the skull base are preserved. Skull and upper cervical spine: Normal marrow signal is preserved. Sinuses/Orbits: Paranasal sinuses are aerated. Bilateral lens replacements. Other: Sella is unremarkable.  Mastoid air cells are clear. MRA HEAD Motion artifact is present. Intracranial internal carotid arteries are patent with atherosclerotic irregularity. Middle and anterior cerebral arteries are patent. Intracranial vertebral arteries, basilar artery, posterior cerebral arteries are patent. Bilateral posterior communicating arteries are present. There is no significant stenosis or aneurysm. MRA NECK Motion artifact is present. Great vessel origins are patent. Common, internal, and external carotid arteries are patent. There is plaque at the right greater than left ICA origins with less than 50% stenosis. Vertebral arteries are patent and codominant without significant stenosis. The origins are not well evaluated. IMPRESSION: Acute small vessel infarct of the left  corona radiata. Mild to moderate chronic microvascular ischemic changes. Plaque at the right greater than left ICA origins with less than 50% stenosis. Electronically Signed   By: Macy Mis M.D.   On: 10/14/2021 09:08  ? ?MR ANGIO NECK W WO CONTRAST ? ?Result Date:  10/14/2021 ?CLINICAL DATA:  Neuro deficit, acute, stroke suspected; cva; right arm and leg weakness EXAM: MRI HEAD WITHOUT CONTRAST MRA HEAD WITHOUT CONTRAST MRA NECK WITHOUT AND WITH CONTRAST TECHNIQUE: Multiplanar, multi-echo pulse sequences of the brain and surrounding structures were acquired without intravenous contrast. Angiographic images of the Circle of Willis were acquired using MRA technique without intravenous contrast. Angiographic images of the neck were acquired using MRA technique without and with intravenous contrast. Carotid stenosis measurements (when applicable) are obtained utilizing NASCET criteria, using the distal internal carotid diameter as the denominator. CONTRAST:  7 mL Gadavist COMPARISON:  None FINDINGS: MRI HEAD Brain: Small area of restricted diffusion involving the left corona radiata. There is no intracranial hemorrhage. There is no intracranial mass, mass effect, or edema. There is no hydrocephalus or extra-axial fluid collection. Prominence of the ventricles and sulci reflects minor parenchymal volume loss. Patchy and confluent areas of T2 hyperintensity in the supratentorial white matter are nonspecific but may reflect mild to moderate chronic microvascular ischemic changes. Vascular: Major vessel flow voids at the skull base are preserved. Skull and upper cervical spine: Normal marrow signal is preserved. Sinuses/Orbits: Paranasal sinuses are aerated. Bilateral lens replacements. Other: Sella is unremarkable.  Mastoid air cells are clear. MRA HEAD Motion artifact is present. Intracranial internal carotid arteries are patent with atherosclerotic irregularity. Middle and anterior cerebral arteries are patent.  Intracranial vertebral arteries, basilar artery, posterior cerebral arteries are patent. Bilateral posterior communicating arteries are present. There is no significant stenosis or aneurysm. MRA NECK Motion artifact is present. Great vessel origins are patent. Common, internal, and external carotid arteries are patent. There is plaque at the right greater than left ICA origins with less than 50% stenosis. Vertebral arteries are patent and codominant without significant stenosis. The origins are not well evaluated. IMPRESSION: Acute small vessel infarct of the left corona radiata. Mild to moderate chronic microvascular ischemic changes. Plaque at the right greater than left ICA origins with less than 50% stenosis. Electronically Signed   By: Macy Mis M.D.   On: 10/14/2021 09:08  ? ?MR BRAIN WO CONTRAST ? ?Result Date: 10/14/2021 ?CLINICAL DATA:  Neuro deficit, acute, stroke suspected; cva; right arm and leg weakness EXAM: MRI HEAD WITHOUT CONTRAST MRA HEAD WITHOUT CONTRAST MRA NECK WITHOUT AND WITH CONTRAST TECHNIQUE: Multiplanar, multi-echo pulse sequences of the brain and surrounding structures were acquired without intravenous contrast. Angiographic images of the Circle of Willis were acquired using MRA technique without intravenous contrast. Angiographic images of the neck were acquired using MRA technique without and with intravenous contrast. Carotid stenosis measurements (when applicable) are obtained utilizing NASCET criteria, using the distal internal carotid diameter as the denominator. CONTRAST:  7 mL Gadavist COMPARISON:  None FINDINGS: MRI HEAD Brain: Small area of restricted diffusion involving the left corona radiata. There is no intracranial hemorrhage. There is no intracranial mass, mass effect, or edema. There is no hydrocephalus or extra-axial fluid collection. Prominence of the ventricles and sulci reflects minor parenchymal volume loss. Patchy and confluent areas of T2 hyperintensity in the  supratentorial white matter are nonspecific but may reflect mild to moderate chronic microvascular ischemic changes. Vascular: Major vessel flow voids at the skull base are preserved. Skull and upper cervical s

## 2021-10-24 LAB — BASIC METABOLIC PANEL
Anion gap: 8 (ref 5–15)
BUN: 27 mg/dL — ABNORMAL HIGH (ref 8–23)
CO2: 24 mmol/L (ref 22–32)
Calcium: 9.9 mg/dL (ref 8.9–10.3)
Chloride: 102 mmol/L (ref 98–111)
Creatinine, Ser: 1.4 mg/dL — ABNORMAL HIGH (ref 0.44–1.00)
GFR, Estimated: 37 mL/min — ABNORMAL LOW (ref >60)
Glucose, Bld: 179 mg/dL — ABNORMAL HIGH (ref 70–99)
Potassium: 4.8 mmol/L (ref 3.5–5.1)
Sodium: 134 mmol/L — ABNORMAL LOW (ref 135–145)

## 2021-10-24 LAB — GLUCOSE, CAPILLARY
Glucose-Capillary: 172 mg/dL — ABNORMAL HIGH (ref 70–99)
Glucose-Capillary: 140 mg/dL — ABNORMAL HIGH (ref 70–99)
Glucose-Capillary: 149 mg/dL — ABNORMAL HIGH (ref 70–99)
Glucose-Capillary: 245 mg/dL — ABNORMAL HIGH (ref 70–99)

## 2021-10-24 MED ORDER — APIXABAN 5 MG PO TABS
ORAL_TABLET | ORAL | 11 refills | Status: DC
Start: 2021-10-24 — End: 2022-11-15

## 2021-10-24 MED ORDER — SITAGLIPTIN PHOS-METFORMIN HCL 50-1000 MG PO TABS: 1.0000 | ORAL_TABLET | Freq: Two times a day (BID) | ORAL | 1 refills | Status: DC

## 2021-10-24 MED ORDER — ACETAMINOPHEN 325 MG PO TABS: 650.0000 mg | ORAL_TABLET | ORAL | Status: AC | PRN

## 2021-10-24 MED ORDER — ESOMEPRAZOLE MAGNESIUM 20 MG PO CPDR: 20.0000 mg | DELAYED_RELEASE_CAPSULE | Freq: Every day | ORAL | 0 refills | Status: DC

## 2021-10-24 MED ORDER — FUROSEMIDE 20 MG PO TABS
10.0000 mg | ORAL_TABLET | Freq: Once | ORAL | Status: AC
  Administered 2021-10-24: 10 mg via ORAL
  Filled 2021-10-24: qty 1

## 2021-10-24 MED ORDER — PROAIR HFA 108 (90 BASE) MCG/ACT IN AERS: 2.0000 | INHALATION_SPRAY | Freq: Four times a day (QID) | RESPIRATORY_TRACT | 12 refills | Status: DC | PRN

## 2021-10-24 MED ORDER — MELATONIN 3 MG PO TABS: 6.0000 mg | ORAL_TABLET | Freq: Every day | ORAL | 1 refills | Status: AC

## 2021-10-24 MED ORDER — VITAMIN D 25 MCG (1000 UNIT) PO TABS
2000.0000 [IU] | ORAL_TABLET | Freq: Every day | ORAL | 0 refills | Status: AC
Start: 2021-10-24 — End: ?

## 2021-10-24 MED ORDER — POLYETHYLENE GLYCOL 3350 17 G PO PACK: 17.0000 g | PACK | Freq: Every day | ORAL | 0 refills | Status: DC

## 2021-10-24 MED ORDER — FUROSEMIDE 20 MG PO TABS: ORAL_TABLET | ORAL | 11 refills | Status: AC

## 2021-10-24 MED ORDER — ATORVASTATIN CALCIUM 40 MG PO TABS: 40.0000 mg | ORAL_TABLET | Freq: Every day | ORAL | 1 refills | Status: DC

## 2021-10-24 MED ORDER — FERROUS SULFATE 324 MG PO TBEC: 324.0000 mg | DELAYED_RELEASE_TABLET | Freq: Every day | ORAL | 1 refills | Status: AC

## 2021-10-24 MED ORDER — CARVEDILOL 6.25 MG PO TABS: 6.2500 mg | ORAL_TABLET | Freq: Two times a day (BID) | ORAL | 0 refills | Status: DC

## 2021-10-24 MED ORDER — SPIRONOLACTONE 25 MG PO TABS: ORAL_TABLET | ORAL | 3 refills | Status: DC

## 2021-10-24 MED ORDER — APIXABAN 5 MG PO TABS
5.0000 mg | ORAL_TABLET | Freq: Two times a day (BID) | ORAL | Status: DC
  Administered 2021-10-24 – 2021-10-25 (×3): 5 mg via ORAL
  Filled 2021-10-24 (×3): qty 1

## 2021-10-24 MED ORDER — MAGNESIUM GLUCONATE 500 MG PO TABS: 250.0000 mg | ORAL_TABLET | Freq: Every day | ORAL | 0 refills | Status: DC

## 2021-10-24 NOTE — Progress Notes (Signed)
Inpatient Rehabilitation Care Coordinator ?Discharge Note  ? ?Patient Details  ?Name: Leslie Kim ?MRN: [id number] ?Date of Birth: [birth year] ? ? ?Discharge location: HOME WITH SON MOVING IN WITH PT TO MAKE SURE SOMEONE IS WITH HER AFTER THE LOSS OF HER HUSBAND ? ?Length of Stay: 8 DAYS ? ?Discharge activity level: SUPERVISION-MOD/I LEVEL ? ?Home/community participation: ACTIVE ? ?Patient response XB:JYNWGN Literacy - How often do you need to have someone help you when you read instructions, pamphlets, or other written material from your doctor or pharmacy?: Never ? ?Patient response FA:OZHYQM Isolation - How often do you feel lonely or isolated from those around you?: Never ? ?Services provided included: MD, RD, PT, OT, RN, CM, Pharmacy, SW ? ?Financial Services:  ?Field seismologist Utilized: Private Insurance ?UHC-MEDICARE ? ?Choices offered to/list presented to: PT ? ?Follow-up services arranged:  ?Home Health, DME, Patient/Family has no preference for HH/DME agencies ?Home Health Agency: [facility]  ADDED HHRN FOR BMP TO BE DRAWN ON Friday AND CALLED INTO DR Marquette Saa PCP ?  ?DME : [facility] ?  ? ?Patient response to transportation need: ?Is the patient able to respond to transportation needs?: Yes ?In the past 12 months, has lack of transportation kept you from medical appointments or from getting medications?: No ?In the past 12 months, has lack of transportation kept you from meetings, work, or from getting things needed for daily living?: No ? ? ? ?Comments (or additional information):PT DID WELL AND MADE PROGRESS QUICKLY FROM CVA. GLAD TO BE GOING HOME. SON HAS MOVED IN ALREADY AND WILL BE THERE WITH HER SINCE HE IS CURRENTLY OUT OF WORK WITH AN INJURY ? ?Patient/Family verbalized understanding of follow-up arrangements:  Yes ? ?Individual responsible for coordination of the follow-up plan: CAROLE-DAUGHTER [phone] ? ?Confirmed correct DME delivered: Lucy Chris  10/24/2021   ? ?Lucy Chris ?

## 2021-10-24 NOTE — Progress Notes (Signed)
Patient ID: Leslie Kim, female   DOB: 07/14/1936, 86 y.o.   MRN: [id number] Have found [facility] home [facility] to accept her referral. Pt feels ready to go home tomorrow. ?

## 2021-10-24 NOTE — Progress Notes (Signed)
Occupational Therapy Discharge Summary ? ?Patient Details  ?Name: Leslie Kim ?MRN: [id number] ?Date of Birth: 07-27-1936 ? ?Today's Date: 10/24/2021 ?OT Individual Time: 1100-1155 ?OT Individual Time Calculation (min): 55 min  ? ? ?Patient has met 8 of 8 long term goals due to improved activity tolerance, improved balance, ability to compensate for deficits, functional use of  RIGHT upper and RIGHT lower extremity, and improved coordination.  Patient to discharge at overall Supervision level for ambulation to bathroom but is mod I to independent with toileting, dressing, bathing. ? Patient's care partner is independent to provide the necessary physical assistance at discharge.   ?Pt education only, no formal family education but pt is able to explain her daughter how to assist her.  ? ?Reasons goals not met: n/a  ? ?Recommendation:  ?Patient will benefit from ongoing skilled OT services in home health setting to continue to advance functional skills in the area of BADL and iADL. ? ?Equipment: ?No equipment provided ? ?Reasons for discharge: treatment goals met ? ?Patient/family agrees with progress made and goals achieved: Yes ? ?OT Discharge ?Precautions/Restrictions  ?Precautions ?Precautions: Fall ?Precaution Comments: mild R hemi ?Restrictions ?Weight Bearing Restrictions: No ? ?ADL ?ADL ?Eating: Independent ?Where Assessed-Eating: Chair ?Grooming: Independent ?Where Assessed-Grooming: Standing at sink ?Upper Body Bathing: Modified independent ?Where Assessed-Upper Body Bathing: Standing at sink, Sitting at sink ?Lower Body Bathing: Modified independent ?Where Assessed-Lower Body Bathing: Standing at sink, Sitting at sink ?Upper Body Dressing: Independent ?Where Assessed-Upper Body Dressing: Standing at sink ?Lower Body Dressing: Independent ?Where Assessed-Lower Body Dressing: Sitting at sink, Standing at sink ?Toileting: Modified independent ?Where Assessed-Toileting: Toilet ?Toilet Transfer: Modified  independent ?Toilet Transfer Method: Stand pivot ?Science writer: Grab bars ?Walk-In Shower Transfer: Close supervision ?Walk-In Shower Transfer Method: Ambulating ?Walk-In Shower Equipment: Civil engineer, contracting without back ?Vision ?Baseline Vision/History: 0 No visual deficits ?Patient Visual Report: No change from baseline ?Vision Assessment?: No apparent visual deficits ?Tracking/Visual Pursuits: Able to track stimulus in all quads without difficulty ?Visual Fields: No apparent deficits ?Perception  ?Perception: Within Functional Limits ?Praxis ?Praxis: Intact ?Cognition ?Cognition ?Overall Cognitive Status: Within Functional Limits for tasks assessed ?Arousal/Alertness: Awake/alert ?Orientation Level: Person;Place;Situation ?Person: Oriented ?Place: Oriented ?Situation: Oriented ?Memory: Appears intact ?Focused Attention: Appears intact ?Sustained Attention: Appears intact ?Selective Attention: Appears intact ?Awareness: Appears intact ?Problem Solving: Appears intact ?Reasoning: Appears intact ?Organizing: Appears intact ?Self Monitoring: Appears intact ?Safety/Judgment: Appears intact ?Brief Interview for Mental Status (BIMS) ?Repetition of Three Words (First Attempt): 3 ?Temporal Orientation: Year: Correct ?Temporal Orientation: Month: Accurate within 5 days ?Temporal Orientation: Day: Correct ?Recall: "Sock": Yes, no cue required ?Recall: "Blue": Yes, no cue required ?Recall: "Bed": Yes, no cue required ?BIMS Summary Score: 15 ?Sensation ?Sensation ?Light Touch: Appears Intact ?Hot/Cold: Appears Intact ?Proprioception: Appears Intact ?Stereognosis: Appears Intact ?Coordination ?Gross Motor Movements are Fluid and Coordinated: No ?Fine Motor Movements are Fluid and Coordinated: Yes ?Coordination and Movement Description: Significant improvement since date of evaluation but still impacted by mild R sided weakness ?Finger Nose Finger Test: very mild ataxia RUE when reaching hand to nose; WNL LUE ?Heel Shin  Test: [facility] ?Motor  ?Motor ?Motor: Hemiplegia ?Motor - Discharge Observations: mild R hemi ?Mobility  ?Bed Mobility ?Bed Mobility: Rolling Right;Rolling Left;Sit to Supine;Supine to Sit ?Rolling Right: Independent ?Rolling Left: Independent ?Supine to Sit: Independent ?Sit to Supine: Independent ?Transfers ?Sit to Stand: Independent with assistive device ?Stand to Sit: Independent with assistive device  ?Trunk/Postural Assessment  ?Cervical Assessment ?Cervical Assessment: Exceptions to [facility] (forward head) ?  Thoracic Assessment ?Thoracic Assessment: Exceptions to [facility] (appears to have large C curve scoliosis, L convexity, L shoulder higher) ?Lumbar Assessment ?Lumbar Assessment: Exceptions to [facility] (posterior pelvic tilt) ?Postural Control ?Postural Control: Within Functional Limits  ?Balance ?Balance ?Balance Assessed: Yes ?Standardized Balance Assessment ?Standardized Balance Assessment: Berg Balance Test ?Merrilee Jansky Balance Test ?Sit to Stand: Able to stand without using hands and stabilize independently ?Standing Unsupported: Able to stand 2 minutes with supervision ?Sitting with Back Unsupported but Feet Supported on Floor or Stool: Able to sit safely and securely 2 minutes ?Stand to Sit: Sits safely with minimal use of hands ?Transfers: Able to transfer safely, minor use of hands ?Standing Unsupported with Eyes Closed: Able to stand 10 seconds with supervision ?Standing Ubsupported with Feet Together: Able to place feet together independently and stand for 1 minute with supervision ?From Standing, Reach Forward with Outstretched Arm: Reaches forward but needs supervision ?From Standing Position, Pick up Object from Floor: Able to pick up shoe, needs supervision ?From Standing Position, Turn to Look Behind Over each Shoulder: Needs assist to keep from losing balance and falling ?Turn 360 Degrees: Needs close supervision or verbal cueing ?Standing Unsupported, Alternately Place Feet on Step/Stool: Able to complete >2  steps/needs minimal assist ?Standing Unsupported, One Foot in Front: Able to take small step independently and hold 30 seconds ?Standing on One Leg: Tries to lift leg/unable to hold 3 seconds but remains standing independently ?Total Score: 34 ?Static Sitting Balance ?Static Sitting - Level of Assistance: 7: Independent ?Dynamic Sitting Balance ?Dynamic Sitting - Level of Assistance: 7: Independent ?Static Standing Balance ?Static Standing - Level of Assistance: 7: Independent ?Dynamic Standing Balance ?Dynamic Standing - Level of Assistance: 6: Modified independent (Device/Increase time) ?Dynamic Standing - Comments: Mod I standing during toileting and dressing tasks ?Extremity/Trunk Assessment ?RUE Assessment ?Active Range of Motion (AROM) Comments: shoulder flexion and abduction 110 ?General Strength Comments: 4-/5 shoulder, 4/5 distally ?LUE Assessment ?Passive Range of Motion (PROM) Comments: sh and abd flexion 130 ?Active Range of Motion (AROM) Comments: sh and abd flexion 130 ?General Strength Comments: 4+/5 ? ?Pt seen this session to do discharge assessments, review with patient her progress with her self care, emphasize recommendations to have supervision with her ambulation.  Pt states her daughter and or granddaughter will be present.   ?Spent most of the session working on R [facility] skills with fine motor tasks with small puzzle pieces and prewriting tasks.  With her signature, her writing is somewhat legible. Had pt work on writing on simple shapes and simple letters to focus on isolated control of pen.  Pt participated well. Pt resting in recliner with all needs met.  ? ? ?[place] ?10/24/2021, 11:22 AM ?

## 2021-10-24 NOTE — Progress Notes (Signed)
Inpatient Rehabilitation Discharge Medication Review by a Pharmacist ? ?A complete drug regimen review was completed for this patient to identify any potential clinically significant medication issues. ? ?High Risk Drug Classes Is patient taking? Indication by Medication  ?Antipsychotic No   ?Anticoagulant Yes Apixaban (atrial fibrillation)  ?Antibiotic No   ?Opioid No   ?Antiplatelet No   ?Hypoglycemics/insulin Yes Sitagliptin/Metformin (diabetes)  ?Vasoactive Medication Yes Carvedilol (hypertension) ?Losartan (hypertension  ?Chemotherapy No   ?Other Yes Atorvastatin (hyperlipidemia) ?Esomeprazole (GERD) ?Trelogy inh (COPD)  ? ? ? ?Type of Medication Issue Identified Description of Issue Recommendation(s)  ?Drug Interaction(s) (clinically significant) ?    ?Duplicate Therapy ?    ?Allergy ?    ?No Medication Administration End Date ?    ?Incorrect Dose ?    ?Additional Drug Therapy Needed ?    ?Significant med changes from prior encounter (inform family/care partners about these prior to discharge).    ?Other ?    ? ? ?Clinically significant medication issues were identified that warrant physician communication and completion of prescribed/recommended actions by midnight of the next day:  No ? ?Pharmacist comments: Pharmacist comments: apixaban lowered during CIR due to decreased renal function which now has improved back to baseline ? ?Time spent performing this drug regimen review (minutes):  30 ? ? ?Thank you for allowing pharmacy to be a part of this patient?s care. ? ?Thelma Barge, PharmD ?Clinical Pharmacist ? ?

## 2021-10-24 NOTE — Discharge Summary (Signed)
Physical Therapy Discharge Summary ? ?Patient Details  ?Name: Leslie Kim ?MRN: [id number] ?Date of Birth: [birth year] ? ?Today's Date: 10/24/2021 ?PT Individual Time: 3151-7616 ?PT Individual Time Calculation (min): 54 min  ? ? ?Patient has met 8 of 8 long term goals due to improved activity tolerance, improved balance, improved postural control, increased strength, and ability to compensate for deficits.  Patient to discharge at an ambulatory level Supervision.   Patient's care partner is independent to provide the necessary physical and cognitive assistance at discharge. ? ?Reasons goals not met: n/a ? ?Recommendation:  ?Patient will benefit from ongoing skilled PT services in home health setting to continue to advance safe functional mobility, address ongoing impairments in dynamic standing balance, gait training with LRAD, home safety, fall prevention, and minimize fall risk. ? ?Equipment: ?RW ? ?Reasons for discharge: treatment goals met and discharge from hospital ? ?Patient/family agrees with progress made and goals achieved: Yes ? ?PT Discharge ?Precautions/Restrictions ?Precautions ?Precautions: Fall ?Precaution Comments: mild R hemi ?Restrictions ?Weight Bearing Restrictions: No ?Pain ?Pain Assessment ?Pain Scale: 0-10 ?Pain Score: 0-No pain ?Pain Interference ?Pain Interference ?Pain Effect on Sleep: 1. Rarely or not at all ?Pain Interference with Therapy Activities: 1. Rarely or not at all ?Pain Interference with Day-to-Day Activities: 1. Rarely or not at all ?Vision/Perception  ?Vision - History ?Ability to See in Adequate Light: 0 Adequate ?Perception ?Perception: Within Functional Limits ?Praxis ?Praxis: Intact  ?Cognition ?Overall Cognitive Status: Within Functional Limits for tasks assessed ?Arousal/Alertness: Awake/alert ?Orientation Level: Oriented X4 ?Focused Attention: Appears intact ?Sustained Attention: Appears intact ?Selective Attention: Appears intact ?Awareness: Appears intact ?Problem  Solving: Appears intact ?Safety/Judgment: Appears intact ?Sensation ?Sensation ?Light Touch: Appears Intact ?Hot/Cold: Appears Intact ?Proprioception: Appears Intact ?Stereognosis: Appears Intact ?Coordination ?Gross Motor Movements are Fluid and Coordinated: No ?Fine Motor Movements are Fluid and Coordinated: Yes ?Coordination and Movement Description: Significant improvement since date of evaluation but still impacted by mild R sided weakness ?Heel Shin Test: [facility] ?Motor  ?Motor ?Motor: Hemiplegia ?Motor - Discharge Observations: mild R hemi  ?Mobility ?Bed Mobility ?Bed Mobility: Rolling Right;Rolling Left;Sit to Supine;Supine to Sit ?Rolling Right: Independent ?Rolling Left: Independent ?Supine to Sit: Independent ?Sit to Supine: Independent ?Transfers ?Transfers: Sit to Stand;Stand to [employer] Transfers ?Sit to Stand: Independent with assistive device ?Stand to Sit: Independent with assistive device ?Stand Pivot Transfers: Independent with assistive device ?Transfer (Assistive device): Rolling walker ?Locomotion  ?Gait ?Ambulation: Yes ?Gait Assistance: Supervision/Verbal cueing ?Gait Distance (Feet): 150 Feet ?Assistive device: Rolling walker ?Gait Assistance Details: Verbal cues for precautions/safety;Verbal cues for gait pattern;Verbal cues for safe use of DME/AE ?Gait ?Gait: Yes ?Gait Pattern: Impaired ?Gait Pattern: Step-through pattern;Poor foot clearance - right;Narrow base of support;Trunk flexed;Decreased weight shift to right ?Gait velocity: decreased ?Stairs / Additional Locomotion ?Stairs: Yes ?Stairs Assistance: Supervision/Verbal cueing ?Stair Management Technique: One rail Right;Step to pattern ?Number of Stairs: 12 ?Height of Stairs: 6 ?Wheelchair Mobility ?Wheelchair Mobility: No  ?Trunk/Postural Assessment  ?Cervical Assessment ?Cervical Assessment: Exceptions to [facility] (forward head) ?Thoracic Assessment ?Thoracic Assessment: Exceptions to [facility] (appears to have large C curve scoliosis, L  convexity, L shoulder higher) ?Lumbar Assessment ?Lumbar Assessment: Exceptions to [facility] (posterior pelvic tilt) ?Postural Control ?Postural Control: Within Functional Limits  ?Balance ?Balance ?Balance Assessed: Yes ?Standardized Balance Assessment ?Standardized Balance Assessment: Berg Balance Test ?Merrilee Jansky Balance Test ?Sit to Stand: Able to stand without using hands and stabilize independently ?Standing Unsupported: Able to stand 2 minutes with supervision ?Sitting with Back Unsupported but Feet Supported on  Floor or Stool: Able to sit safely and securely 2 minutes ?Stand to Sit: Sits safely with minimal use of hands ?Transfers: Able to transfer safely, minor use of hands ?Standing Unsupported with Eyes Closed: Able to stand 10 seconds with supervision ?Standing Ubsupported with Feet Together: Able to place feet together independently and stand for 1 minute with supervision ?From Standing, Reach Forward with Outstretched Arm: Reaches forward but needs supervision ?From Standing Position, Pick up Object from Floor: Able to pick up shoe, needs supervision ?From Standing Position, Turn to Look Behind Over each Shoulder: Needs assist to keep from losing balance and falling ?Turn 360 Degrees: Needs close supervision or verbal cueing ?Standing Unsupported, Alternately Place Feet on Step/Stool: Able to complete >2 steps/needs minimal assist ?Standing Unsupported, One Foot in Front: Able to take small step independently and hold 30 seconds ?Standing on One Leg: Tries to lift leg/unable to hold 3 seconds but remains standing independently ?Total Score: 34/56 ?Extremity Assessment  ?  ?  ?RLE Assessment ?RLE Assessment: Exceptions to [facility] ?General Strength Comments: Grossly 4+/5 ?LLE Assessment ?LLE Assessment: Within Functional Limits ? ?Skilled Intervention: ?Pt sitting in recliner to start. Agreeable to PT tx. Denies pain. Pt completing UB dressing and a few self care tasks (combing hair) without assist while seated in  recliner. She did require minA for donning slip-on shoes due to difficulty getting heel in.  ? ?Stand<>pivot transfer to w/c mod I with RW and then transported in w/c to main rehab gym for time. ? ?Sit<>stand to RW from w/c mod I and then she ambulated 270 ft with RW and supervision - VC only required for keeping body within walker frame and to avoid forward flexed trunk. Instructed in stair training - able to navigate up/down x12, 6inch steps using 1 hand rail on R with supervision assist - verbal cueing provided for step-to pattern rather than self selected alternating pattern to reduce her falls risk and improve stability during stairs - pt voiced understanding.  ? ?Completed car transfers with car height simulating daughter's mid-size SUV [employer] - completed with setupA and no significant difficulty. ? ?Worked in ADL apartment to simulate home environment - completed furniture transfers mod I with RW and bed mobility on regular flat bed indep. Of note, pt's bed at home as features that allow [facility] and feet to raise/lower, does not typically sleep in flat bed.  ? ?Ambulated back to her room with RW and supervision.  ? ?Concluded session seated in recliner, chair alarm on, call bell in reach, all needs met.  ? ?Alger Simons PT, DPT ?10/24/2021, 8:19 AM ?

## 2021-10-24 NOTE — Progress Notes (Signed)
Physical Therapy Session Note ? ?Patient Details  ?Name: BRIZEYDA HOLTMEYER ?MRN: [id number] ?Date of Birth: Aug 08, 1935 ? ?Today's Date: 10/24/2021 ?PT Group Time: 1000-1100 ?PT Group Time Calculation (min): 60 min ? ?Short Term Goals: ?Week 1:  PT Short Term Goal 1 (Week 1): STG = LTG ? ?Skilled Therapeutic Interventions/Progress Updates:  ?    Pt received seated in w/c in dayroom for group therapy session. Session focus on therapeutic activities including seated ball toss with other group members while engaging in cognitive task of naming a type of food for each letter of the alphabet. Pt also engages in UB strengthening with use of 4# dowel rod performing bicep curls, chest press, and alt L/R canoeing x 10-15 reps each. Pt returned to room and left seated in w/c with needs in reach at end of session. ? ?Therapy Documentation ?Precautions:  ?Precautions ?Precautions: Fall ?Precaution Comments: mild R hemi ?Restrictions ?Weight Bearing Restrictions: No ? ? ? ? ? ? ?Therapy/Group: Group Therapy ? ? ?Peter Congo, PT, DPT, CSRS ?10/24/2021, 5:24 PM  ?

## 2021-10-24 NOTE — Progress Notes (Signed)
?                                                       PROGRESS NOTE ? ? ?Subjective/Complaints: ?Seen ambulating with therapy multiple times today with RW- doing great! ?Worked on stairs ?34/56 reported on Berg yesterday ? ?ROS: Patient denies fever, rash, sore throat, blurred vision, dizziness, nausea, vomiting, diarrhea, cough, shortness of breath or chest pain, joint or back/neck pain, headache, or mood change.  ? ?Objective: ?  ?No results found. ?No results for input(s): WBC, HGB, HCT, PLT in the last 72 hours. ? ?Recent Labs  ?  10/24/21 ?0638  ?NA 134*  ?K 4.8  ?CL 102  ?CO2 24  ?GLUCOSE 179*  ?BUN 27*  ?CREATININE 1.40*  ?CALCIUM 9.9  ? ? ?Intake/Output Summary (Last 24 hours) at 10/24/2021 1022 ?Last data filed at 10/24/2021 0730 ?Gross per 24 hour  ?Intake 1380 ml  ?Output --  ?Net 1380 ml  ?  ? ?  ? ?Physical Exam: ?Vital Signs ?Blood pressure (!) 147/67, pulse 72, temperature 97.9 ?F (36.6 ?C), temperature source Oral, resp. rate 18, height 5\' 3"  (1.6 m), weight 71.8 kg, SpO2 95 %. ?Constitutional: No distress . Vital signs reviewed. BMI 28.04, weight 71.8kg ?HEENT: NCAT, EOMI, oral membranes moist ?Neck: supple ?Cardiovascular: RRR without murmur. No JVD    ?Respiratory/Chest: CTA Bilaterally without wheezes or rales. Normal effort    ?GI/Abdomen: BS +, non-tender, non-distended ?Ext: no clubbing, cyanosis, or edema ?Psych: pleasant and cooperative  ?Skin: intact ?Neurological:  ?   Mental Status: She is alert.  ?   Comments: Alert and oriented x 3. Follows commands very well. Normal insight and awareness. Intact Memory. Normal language and speech. Cranial nerve exam unremarkable except for mild right central 7. RUE 4-/5 with decreased [place]. RLE 4+/5 prox to distal. LUE and LLE 5/5. Sensory exam normal for light touch and pain in all 4 limbs. No limb ataxia or cerebellar signs. No abnormal tone appreciated.   ?  ? ? ?Assessment/Plan: ?1. Functional deficits which require 3+ hours per day of  interdisciplinary therapy in a comprehensive inpatient rehab setting. ?Physiatrist is providing close team supervision and 24 hour management of active medical problems listed below. ?Physiatrist and rehab team continue to assess barriers to discharge/monitor patient progress toward functional and medical goals ? ?Care Tool: ? ?Bathing ?   ?   ?   ?  ?  ?Bathing assist Assist Level: Contact Guard/Touching assist ?  ?  ?Upper Body Dressing/Undressing ?Upper body dressing   ?What is the patient wearing?:  (pt own pjs) ?   ?Upper body assist Assist Level: Supervision/Verbal cueing ?   ?Lower Body Dressing/Undressing ?Lower body dressing ? ? ?   ?What is the patient wearing?:  (pt own pjs) ? ?  ? ?Lower body assist Assist for lower body dressing: Supervision/Verbal cueing ?   ? ?Toileting ?Toileting    ?Toileting assist Assist for toileting: Independent with assistive device ?Assistive Device Comment: walker ?  ?Transfers ?Chair/bed transfer ? ?Transfers assist ?   ? ?Chair/bed transfer assist level: Independent with assistive device ?Chair/bed transfer assistive device: Walker ?  ?Locomotion ?Ambulation ? ? ?Ambulation assist ? ?   ? ?Assist level: Supervision/Verbal cueing ?Assistive device: Walker-rolling ?Max distance: 150  ? ?Walk 10 feet activity ? ? ?  Assist ?   ? ?Assist level: Supervision/Verbal cueing ?Assistive device: Walker-rolling  ? ?Walk 50 feet activity ? ? ?Assist   ? ?Assist level: Supervision/Verbal cueing ?Assistive device: Walker-rolling  ? ? ?Walk 150 feet activity ? ? ?Assist   ? ?Assist level: Supervision/Verbal cueing ?Assistive device: Walker-rolling ?  ? ?Walk 10 feet on uneven surface  ?activity ? ? ?Assist   ? ? ?Assist level: Supervision/Verbal cueing ?Assistive device: Walker-rolling  ? ?Wheelchair ? ? ? ? ?Assist Is the patient using a wheelchair?: No ?  ?  ? ?  ?   ? ? ?Wheelchair 50 feet with 2 turns activity ? ? ? ?Assist ? ?  ?  ? ? ?   ? ?Wheelchair 150 feet activity  ? ? ? ?Assist ?    ? ? ?   ? ?Blood pressure (!) 147/67, pulse 72, temperature 97.9 ?F (36.6 ?C), temperature source Oral, resp. rate 18, height 5\' 3"  (1.6 m), weight 71.8 kg, SpO2 95 %. ? ?Medical Problem List and Plan: ?1. Functional deficits secondary to left corona radiata infarction with right HP. ?            -patient may shower ?            -ELOS/Goals: 8 days, supervision to [employer] with PT, OT, SLP ? HFU scheduled ? -Continue CIR therapies including PT, OT, and SLP  ?2.  Antithrombotics: ?-DVT/anticoagulation:  Pharmaceutical: Other (comment) ELIQUIS ?            -antiplatelet therapy: N/A ?3. Pain Management: Tylenol as needed ?4. Insomnia: continue Melatonin 6 mg nightly ?            -antipsychotic agents: N/A ?5. Neuropsych: This patient is capable of making decisions on her own behalf. ?6. Skin/Wound Care: Routine skin checks ?7. Fluids/Electrolytes/Nutrition: Routine in and out with follow-up chemistries ?8.  Diastolic congestive heart failure.    Monitor for signs of fluid overload ?            -check weights daily ? 10mg  Lasix today ?Filed Weights  ? 10/22/21 0457 10/23/21 0505 10/24/21 0503  ?Weight: 71.8 kg 71.8 kg 71.8 kg  ?  ?9.  Hyperlipidemia.  Lipitor ?10.  Atrial fibrillation.  Continue Eliquis.  Cardiac rate controlled ?11.  Hypertension.  Coreg 6.25 mg twice daily, Cozaar 50 mg twice daily,  ?12.  Diabetes mellitus type 2.  Hemoglobin A1c 6.3.   ?-Currently with SSI. Adjust regimen as needed. ?Add 10mg  Lasix today ?CBG (last 3)  ?Recent Labs  ?  10/23/21 ?1641 10/23/21 ?2106 10/24/21 ?0616  ?GLUCAP 207* 157* 172*  ? 3/25 fair control ?13.  COPD.  Quit smoking 13 years ago.  Continue inhalers as directed. ?14.  GERD.  Protonix ?15. Iron deficiency anemia: continue iron supplement ?16. Suboptimal B12: B12 checked and improved to 509 ?17. AKI:  ? -encourage adequate fluids ? -hold aldactone ? -Cr improving, repeat tomorrow ?18. Constipation: 64mL milk of magnesia given 3/22 with resolution of constipation  that day.  ? ?LOS: ?7 days ?A FACE TO FACE EVALUATION WAS PERFORMED ? ?Martha Clan P Debralee Braaksma ?10/24/2021, 10:22 AM  ? ?  ?

## 2021-10-25 LAB — BASIC METABOLIC PANEL
Anion gap: 9 (ref 5–15)
BUN: 23 mg/dL (ref 8–23)
CO2: 24 mmol/L (ref 22–32)
Calcium: 9.9 mg/dL (ref 8.9–10.3)
Chloride: 99 mmol/L (ref 98–111)
Creatinine, Ser: 1.27 mg/dL — ABNORMAL HIGH (ref 0.44–1.00)
GFR, Estimated: 42 mL/min — ABNORMAL LOW (ref >60)
Glucose, Bld: 179 mg/dL — ABNORMAL HIGH (ref 70–99)
Potassium: 4.5 mmol/L (ref 3.5–5.1)
Sodium: 132 mmol/L — ABNORMAL LOW (ref 135–145)

## 2021-10-25 LAB — GLUCOSE, CAPILLARY: Glucose-Capillary: 176 mg/dL — ABNORMAL HIGH (ref 70–99)

## 2021-10-25 NOTE — Progress Notes (Signed)
INPATIENT REHABILITATION DISCHARGE NOTE ? ? ?Discharge instructions by: Dan PA ? ?Verbalized understanding:yes ? ?Skin care/Wound care healing?none ? ?Pain:none ? ?IV's:none ? ?Tubes/Drains:none ? ?O2:none ? ?Safety instructions:done ? ?Patient belongings:done ? ?Discharged to:home ? ?Discharged via:wheelchair ? ?Notes: ? ? ? ? ? ? ?  ?

## 2021-10-27 DIAGNOSIS — D509 Iron deficiency anemia, unspecified: Secondary | ICD-10-CM | POA: Diagnosis not present

## 2021-10-27 DIAGNOSIS — Z7951 Long term (current) use of inhaled steroids: Secondary | ICD-10-CM | POA: Diagnosis not present

## 2021-10-27 DIAGNOSIS — I5030 Unspecified diastolic (congestive) heart failure: Secondary | ICD-10-CM | POA: Diagnosis not present

## 2021-10-27 DIAGNOSIS — Z7984 Long term (current) use of oral hypoglycemic drugs: Secondary | ICD-10-CM | POA: Diagnosis not present

## 2021-10-27 DIAGNOSIS — I10 Essential (primary) hypertension: Secondary | ICD-10-CM | POA: Diagnosis not present

## 2021-10-27 DIAGNOSIS — Z87891 Personal history of nicotine dependence: Secondary | ICD-10-CM | POA: Diagnosis not present

## 2021-10-27 DIAGNOSIS — Z9181 History of falling: Secondary | ICD-10-CM | POA: Diagnosis not present

## 2021-10-27 DIAGNOSIS — I110 Hypertensive heart disease with heart failure: Secondary | ICD-10-CM | POA: Diagnosis not present

## 2021-10-27 DIAGNOSIS — I5031 Acute diastolic (congestive) heart failure: Secondary | ICD-10-CM | POA: Diagnosis not present

## 2021-10-27 DIAGNOSIS — I69351 Hemiplegia and hemiparesis following cerebral infarction affecting right dominant side: Secondary | ICD-10-CM | POA: Diagnosis not present

## 2021-10-27 DIAGNOSIS — I428 Other cardiomyopathies: Secondary | ICD-10-CM | POA: Diagnosis not present

## 2021-10-27 DIAGNOSIS — K219 Gastro-esophageal reflux disease without esophagitis: Secondary | ICD-10-CM | POA: Diagnosis not present

## 2021-10-27 DIAGNOSIS — J449 Chronic obstructive pulmonary disease, unspecified: Secondary | ICD-10-CM | POA: Diagnosis not present

## 2021-10-27 DIAGNOSIS — E119 Type 2 diabetes mellitus without complications: Secondary | ICD-10-CM | POA: Diagnosis not present

## 2021-10-27 DIAGNOSIS — I4891 Unspecified atrial fibrillation: Secondary | ICD-10-CM | POA: Diagnosis not present

## 2021-10-27 DIAGNOSIS — Z7901 Long term (current) use of anticoagulants: Secondary | ICD-10-CM | POA: Diagnosis not present

## 2021-10-28 DIAGNOSIS — K219 Gastro-esophageal reflux disease without esophagitis: Secondary | ICD-10-CM | POA: Diagnosis not present

## 2021-10-28 DIAGNOSIS — I10 Essential (primary) hypertension: Secondary | ICD-10-CM | POA: Diagnosis not present

## 2021-10-31 DIAGNOSIS — Z87891 Personal history of nicotine dependence: Secondary | ICD-10-CM | POA: Diagnosis not present

## 2021-10-31 DIAGNOSIS — I428 Other cardiomyopathies: Secondary | ICD-10-CM | POA: Diagnosis not present

## 2021-10-31 DIAGNOSIS — D509 Iron deficiency anemia, unspecified: Secondary | ICD-10-CM | POA: Diagnosis not present

## 2021-10-31 DIAGNOSIS — E119 Type 2 diabetes mellitus without complications: Secondary | ICD-10-CM | POA: Diagnosis not present

## 2021-10-31 DIAGNOSIS — K219 Gastro-esophageal reflux disease without esophagitis: Secondary | ICD-10-CM | POA: Diagnosis not present

## 2021-10-31 DIAGNOSIS — J449 Chronic obstructive pulmonary disease, unspecified: Secondary | ICD-10-CM | POA: Diagnosis not present

## 2021-10-31 DIAGNOSIS — Z9181 History of falling: Secondary | ICD-10-CM | POA: Diagnosis not present

## 2021-10-31 DIAGNOSIS — I69351 Hemiplegia and hemiparesis following cerebral infarction affecting right dominant side: Secondary | ICD-10-CM | POA: Diagnosis not present

## 2021-10-31 DIAGNOSIS — I4891 Unspecified atrial fibrillation: Secondary | ICD-10-CM | POA: Diagnosis not present

## 2021-10-31 DIAGNOSIS — Z7951 Long term (current) use of inhaled steroids: Secondary | ICD-10-CM | POA: Diagnosis not present

## 2021-10-31 DIAGNOSIS — I110 Hypertensive heart disease with heart failure: Secondary | ICD-10-CM | POA: Diagnosis not present

## 2021-10-31 DIAGNOSIS — I5030 Unspecified diastolic (congestive) heart failure: Secondary | ICD-10-CM | POA: Diagnosis not present

## 2021-10-31 DIAGNOSIS — Z7901 Long term (current) use of anticoagulants: Secondary | ICD-10-CM | POA: Diagnosis not present

## 2021-10-31 DIAGNOSIS — Z7984 Long term (current) use of oral hypoglycemic drugs: Secondary | ICD-10-CM | POA: Diagnosis not present

## 2021-11-01 DIAGNOSIS — I428 Other cardiomyopathies: Secondary | ICD-10-CM | POA: Diagnosis not present

## 2021-11-01 DIAGNOSIS — Z7901 Long term (current) use of anticoagulants: Secondary | ICD-10-CM | POA: Diagnosis not present

## 2021-11-01 DIAGNOSIS — E119 Type 2 diabetes mellitus without complications: Secondary | ICD-10-CM | POA: Diagnosis not present

## 2021-11-01 DIAGNOSIS — J449 Chronic obstructive pulmonary disease, unspecified: Secondary | ICD-10-CM | POA: Diagnosis not present

## 2021-11-01 DIAGNOSIS — D509 Iron deficiency anemia, unspecified: Secondary | ICD-10-CM | POA: Diagnosis not present

## 2021-11-01 DIAGNOSIS — I110 Hypertensive heart disease with heart failure: Secondary | ICD-10-CM | POA: Diagnosis not present

## 2021-11-01 DIAGNOSIS — I5030 Unspecified diastolic (congestive) heart failure: Secondary | ICD-10-CM | POA: Diagnosis not present

## 2021-11-01 DIAGNOSIS — Z9181 History of falling: Secondary | ICD-10-CM | POA: Diagnosis not present

## 2021-11-01 DIAGNOSIS — Z7951 Long term (current) use of inhaled steroids: Secondary | ICD-10-CM | POA: Diagnosis not present

## 2021-11-01 DIAGNOSIS — I69351 Hemiplegia and hemiparesis following cerebral infarction affecting right dominant side: Secondary | ICD-10-CM | POA: Diagnosis not present

## 2021-11-01 DIAGNOSIS — Z7984 Long term (current) use of oral hypoglycemic drugs: Secondary | ICD-10-CM | POA: Diagnosis not present

## 2021-11-01 DIAGNOSIS — Z87891 Personal history of nicotine dependence: Secondary | ICD-10-CM | POA: Diagnosis not present

## 2021-11-01 DIAGNOSIS — I4891 Unspecified atrial fibrillation: Secondary | ICD-10-CM | POA: Diagnosis not present

## 2021-11-01 DIAGNOSIS — K219 Gastro-esophageal reflux disease without esophagitis: Secondary | ICD-10-CM | POA: Diagnosis not present

## 2021-11-02 DIAGNOSIS — J449 Chronic obstructive pulmonary disease, unspecified: Secondary | ICD-10-CM | POA: Diagnosis not present

## 2021-11-02 DIAGNOSIS — D509 Iron deficiency anemia, unspecified: Secondary | ICD-10-CM | POA: Diagnosis not present

## 2021-11-02 DIAGNOSIS — E782 Mixed hyperlipidemia: Secondary | ICD-10-CM | POA: Diagnosis not present

## 2021-11-02 DIAGNOSIS — I5032 Chronic diastolic (congestive) heart failure: Secondary | ICD-10-CM | POA: Diagnosis not present

## 2021-11-02 DIAGNOSIS — I10 Essential (primary) hypertension: Secondary | ICD-10-CM | POA: Diagnosis not present

## 2021-11-02 DIAGNOSIS — E0821 Diabetes mellitus due to underlying condition with diabetic nephropathy: Secondary | ICD-10-CM | POA: Diagnosis not present

## 2021-11-02 DIAGNOSIS — I480 Paroxysmal atrial fibrillation: Secondary | ICD-10-CM | POA: Diagnosis not present

## 2021-11-02 DIAGNOSIS — N1832 Chronic kidney disease, stage 3b: Secondary | ICD-10-CM | POA: Diagnosis not present

## 2021-11-02 DIAGNOSIS — I69959 Hemiplegia and hemiparesis following unspecified cerebrovascular disease affecting unspecified side: Secondary | ICD-10-CM | POA: Diagnosis not present

## 2021-11-02 DIAGNOSIS — I69353 Hemiplegia and hemiparesis following cerebral infarction affecting right non-dominant side: Secondary | ICD-10-CM | POA: Diagnosis not present

## 2021-11-04 DIAGNOSIS — Z7984 Long term (current) use of oral hypoglycemic drugs: Secondary | ICD-10-CM | POA: Diagnosis not present

## 2021-11-04 DIAGNOSIS — K219 Gastro-esophageal reflux disease without esophagitis: Secondary | ICD-10-CM | POA: Diagnosis not present

## 2021-11-04 DIAGNOSIS — Z87891 Personal history of nicotine dependence: Secondary | ICD-10-CM | POA: Diagnosis not present

## 2021-11-04 DIAGNOSIS — Z9181 History of falling: Secondary | ICD-10-CM | POA: Diagnosis not present

## 2021-11-04 DIAGNOSIS — I5030 Unspecified diastolic (congestive) heart failure: Secondary | ICD-10-CM | POA: Diagnosis not present

## 2021-11-04 DIAGNOSIS — E119 Type 2 diabetes mellitus without complications: Secondary | ICD-10-CM | POA: Diagnosis not present

## 2021-11-04 DIAGNOSIS — I110 Hypertensive heart disease with heart failure: Secondary | ICD-10-CM | POA: Diagnosis not present

## 2021-11-04 DIAGNOSIS — Z7901 Long term (current) use of anticoagulants: Secondary | ICD-10-CM | POA: Diagnosis not present

## 2021-11-04 DIAGNOSIS — I428 Other cardiomyopathies: Secondary | ICD-10-CM | POA: Diagnosis not present

## 2021-11-04 DIAGNOSIS — D509 Iron deficiency anemia, unspecified: Secondary | ICD-10-CM | POA: Diagnosis not present

## 2021-11-04 DIAGNOSIS — I69351 Hemiplegia and hemiparesis following cerebral infarction affecting right dominant side: Secondary | ICD-10-CM | POA: Diagnosis not present

## 2021-11-04 DIAGNOSIS — I4891 Unspecified atrial fibrillation: Secondary | ICD-10-CM | POA: Diagnosis not present

## 2021-11-04 DIAGNOSIS — J449 Chronic obstructive pulmonary disease, unspecified: Secondary | ICD-10-CM | POA: Diagnosis not present

## 2021-11-04 DIAGNOSIS — Z7951 Long term (current) use of inhaled steroids: Secondary | ICD-10-CM | POA: Diagnosis not present

## 2021-11-07 DIAGNOSIS — I428 Other cardiomyopathies: Secondary | ICD-10-CM | POA: Diagnosis not present

## 2021-11-07 DIAGNOSIS — I4891 Unspecified atrial fibrillation: Secondary | ICD-10-CM | POA: Diagnosis not present

## 2021-11-07 DIAGNOSIS — K219 Gastro-esophageal reflux disease without esophagitis: Secondary | ICD-10-CM | POA: Diagnosis not present

## 2021-11-07 DIAGNOSIS — I5030 Unspecified diastolic (congestive) heart failure: Secondary | ICD-10-CM | POA: Diagnosis not present

## 2021-11-07 DIAGNOSIS — I69351 Hemiplegia and hemiparesis following cerebral infarction affecting right dominant side: Secondary | ICD-10-CM | POA: Diagnosis not present

## 2021-11-07 DIAGNOSIS — Z7984 Long term (current) use of oral hypoglycemic drugs: Secondary | ICD-10-CM | POA: Diagnosis not present

## 2021-11-07 DIAGNOSIS — D509 Iron deficiency anemia, unspecified: Secondary | ICD-10-CM | POA: Diagnosis not present

## 2021-11-07 DIAGNOSIS — Z9181 History of falling: Secondary | ICD-10-CM | POA: Diagnosis not present

## 2021-11-07 DIAGNOSIS — Z7951 Long term (current) use of inhaled steroids: Secondary | ICD-10-CM | POA: Diagnosis not present

## 2021-11-07 DIAGNOSIS — I110 Hypertensive heart disease with heart failure: Secondary | ICD-10-CM | POA: Diagnosis not present

## 2021-11-07 DIAGNOSIS — E119 Type 2 diabetes mellitus without complications: Secondary | ICD-10-CM | POA: Diagnosis not present

## 2021-11-07 DIAGNOSIS — J449 Chronic obstructive pulmonary disease, unspecified: Secondary | ICD-10-CM | POA: Diagnosis not present

## 2021-11-07 DIAGNOSIS — Z7901 Long term (current) use of anticoagulants: Secondary | ICD-10-CM | POA: Diagnosis not present

## 2021-11-07 DIAGNOSIS — Z87891 Personal history of nicotine dependence: Secondary | ICD-10-CM | POA: Diagnosis not present

## 2021-11-08 DIAGNOSIS — I69351 Hemiplegia and hemiparesis following cerebral infarction affecting right dominant side: Secondary | ICD-10-CM | POA: Diagnosis not present

## 2021-11-08 DIAGNOSIS — I428 Other cardiomyopathies: Secondary | ICD-10-CM | POA: Diagnosis not present

## 2021-11-08 DIAGNOSIS — Z7951 Long term (current) use of inhaled steroids: Secondary | ICD-10-CM | POA: Diagnosis not present

## 2021-11-08 DIAGNOSIS — K219 Gastro-esophageal reflux disease without esophagitis: Secondary | ICD-10-CM | POA: Diagnosis not present

## 2021-11-08 DIAGNOSIS — J449 Chronic obstructive pulmonary disease, unspecified: Secondary | ICD-10-CM | POA: Diagnosis not present

## 2021-11-08 DIAGNOSIS — I5030 Unspecified diastolic (congestive) heart failure: Secondary | ICD-10-CM | POA: Diagnosis not present

## 2021-11-08 DIAGNOSIS — I110 Hypertensive heart disease with heart failure: Secondary | ICD-10-CM | POA: Diagnosis not present

## 2021-11-08 DIAGNOSIS — Z7984 Long term (current) use of oral hypoglycemic drugs: Secondary | ICD-10-CM | POA: Diagnosis not present

## 2021-11-08 DIAGNOSIS — Z9181 History of falling: Secondary | ICD-10-CM | POA: Diagnosis not present

## 2021-11-08 DIAGNOSIS — D509 Iron deficiency anemia, unspecified: Secondary | ICD-10-CM | POA: Diagnosis not present

## 2021-11-08 DIAGNOSIS — Z7901 Long term (current) use of anticoagulants: Secondary | ICD-10-CM | POA: Diagnosis not present

## 2021-11-08 DIAGNOSIS — E119 Type 2 diabetes mellitus without complications: Secondary | ICD-10-CM | POA: Diagnosis not present

## 2021-11-08 DIAGNOSIS — I4891 Unspecified atrial fibrillation: Secondary | ICD-10-CM | POA: Diagnosis not present

## 2021-11-08 DIAGNOSIS — Z87891 Personal history of nicotine dependence: Secondary | ICD-10-CM | POA: Diagnosis not present

## 2021-11-16 DIAGNOSIS — Z9181 History of falling: Secondary | ICD-10-CM | POA: Diagnosis not present

## 2021-11-16 DIAGNOSIS — D509 Iron deficiency anemia, unspecified: Secondary | ICD-10-CM | POA: Diagnosis not present

## 2021-11-16 DIAGNOSIS — I5030 Unspecified diastolic (congestive) heart failure: Secondary | ICD-10-CM | POA: Diagnosis not present

## 2021-11-16 DIAGNOSIS — J449 Chronic obstructive pulmonary disease, unspecified: Secondary | ICD-10-CM | POA: Diagnosis not present

## 2021-11-16 DIAGNOSIS — I69351 Hemiplegia and hemiparesis following cerebral infarction affecting right dominant side: Secondary | ICD-10-CM | POA: Diagnosis not present

## 2021-11-16 DIAGNOSIS — I110 Hypertensive heart disease with heart failure: Secondary | ICD-10-CM | POA: Diagnosis not present

## 2021-11-16 DIAGNOSIS — E119 Type 2 diabetes mellitus without complications: Secondary | ICD-10-CM | POA: Diagnosis not present

## 2021-11-16 DIAGNOSIS — Z7984 Long term (current) use of oral hypoglycemic drugs: Secondary | ICD-10-CM | POA: Diagnosis not present

## 2021-11-16 DIAGNOSIS — K219 Gastro-esophageal reflux disease without esophagitis: Secondary | ICD-10-CM | POA: Diagnosis not present

## 2021-11-16 DIAGNOSIS — I428 Other cardiomyopathies: Secondary | ICD-10-CM | POA: Diagnosis not present

## 2021-11-16 DIAGNOSIS — Z7901 Long term (current) use of anticoagulants: Secondary | ICD-10-CM | POA: Diagnosis not present

## 2021-11-16 DIAGNOSIS — Z7951 Long term (current) use of inhaled steroids: Secondary | ICD-10-CM | POA: Diagnosis not present

## 2021-11-16 DIAGNOSIS — I4891 Unspecified atrial fibrillation: Secondary | ICD-10-CM | POA: Diagnosis not present

## 2021-11-16 DIAGNOSIS — Z87891 Personal history of nicotine dependence: Secondary | ICD-10-CM | POA: Diagnosis not present

## 2021-11-21 DIAGNOSIS — I4891 Unspecified atrial fibrillation: Secondary | ICD-10-CM | POA: Diagnosis not present

## 2021-11-21 DIAGNOSIS — Z7984 Long term (current) use of oral hypoglycemic drugs: Secondary | ICD-10-CM | POA: Diagnosis not present

## 2021-11-21 DIAGNOSIS — I428 Other cardiomyopathies: Secondary | ICD-10-CM | POA: Diagnosis not present

## 2021-11-21 DIAGNOSIS — I110 Hypertensive heart disease with heart failure: Secondary | ICD-10-CM | POA: Diagnosis not present

## 2021-11-21 DIAGNOSIS — Z7951 Long term (current) use of inhaled steroids: Secondary | ICD-10-CM | POA: Diagnosis not present

## 2021-11-21 DIAGNOSIS — E119 Type 2 diabetes mellitus without complications: Secondary | ICD-10-CM | POA: Diagnosis not present

## 2021-11-21 DIAGNOSIS — I5030 Unspecified diastolic (congestive) heart failure: Secondary | ICD-10-CM | POA: Diagnosis not present

## 2021-11-21 DIAGNOSIS — Z9181 History of falling: Secondary | ICD-10-CM | POA: Diagnosis not present

## 2021-11-21 DIAGNOSIS — Z87891 Personal history of nicotine dependence: Secondary | ICD-10-CM | POA: Diagnosis not present

## 2021-11-21 DIAGNOSIS — I69351 Hemiplegia and hemiparesis following cerebral infarction affecting right dominant side: Secondary | ICD-10-CM | POA: Diagnosis not present

## 2021-11-21 DIAGNOSIS — Z7901 Long term (current) use of anticoagulants: Secondary | ICD-10-CM | POA: Diagnosis not present

## 2021-11-21 DIAGNOSIS — J449 Chronic obstructive pulmonary disease, unspecified: Secondary | ICD-10-CM | POA: Diagnosis not present

## 2021-11-21 DIAGNOSIS — D509 Iron deficiency anemia, unspecified: Secondary | ICD-10-CM | POA: Diagnosis not present

## 2021-11-21 DIAGNOSIS — K219 Gastro-esophageal reflux disease without esophagitis: Secondary | ICD-10-CM | POA: Diagnosis not present

## 2021-11-23 DIAGNOSIS — I110 Hypertensive heart disease with heart failure: Secondary | ICD-10-CM | POA: Diagnosis not present

## 2021-11-23 DIAGNOSIS — I69351 Hemiplegia and hemiparesis following cerebral infarction affecting right dominant side: Secondary | ICD-10-CM | POA: Diagnosis not present

## 2021-11-23 DIAGNOSIS — Z7951 Long term (current) use of inhaled steroids: Secondary | ICD-10-CM | POA: Diagnosis not present

## 2021-11-23 DIAGNOSIS — Z7901 Long term (current) use of anticoagulants: Secondary | ICD-10-CM | POA: Diagnosis not present

## 2021-11-23 DIAGNOSIS — Z87891 Personal history of nicotine dependence: Secondary | ICD-10-CM | POA: Diagnosis not present

## 2021-11-23 DIAGNOSIS — I4891 Unspecified atrial fibrillation: Secondary | ICD-10-CM | POA: Diagnosis not present

## 2021-11-23 DIAGNOSIS — K219 Gastro-esophageal reflux disease without esophagitis: Secondary | ICD-10-CM | POA: Diagnosis not present

## 2021-11-23 DIAGNOSIS — I5030 Unspecified diastolic (congestive) heart failure: Secondary | ICD-10-CM | POA: Diagnosis not present

## 2021-11-23 DIAGNOSIS — D509 Iron deficiency anemia, unspecified: Secondary | ICD-10-CM | POA: Diagnosis not present

## 2021-11-23 DIAGNOSIS — Z7984 Long term (current) use of oral hypoglycemic drugs: Secondary | ICD-10-CM | POA: Diagnosis not present

## 2021-11-23 DIAGNOSIS — J449 Chronic obstructive pulmonary disease, unspecified: Secondary | ICD-10-CM | POA: Diagnosis not present

## 2021-11-23 DIAGNOSIS — E119 Type 2 diabetes mellitus without complications: Secondary | ICD-10-CM | POA: Diagnosis not present

## 2021-11-23 DIAGNOSIS — I428 Other cardiomyopathies: Secondary | ICD-10-CM | POA: Diagnosis not present

## 2021-11-23 DIAGNOSIS — Z9181 History of falling: Secondary | ICD-10-CM | POA: Diagnosis not present

## 2021-12-09 ENCOUNTER — Ambulatory Visit (INDEPENDENT_AMBULATORY_CARE_PROVIDER_SITE_OTHER): Payer: Medicare Other | Admitting: Family Medicine

## 2021-12-09 ENCOUNTER — Encounter: Payer: Self-pay | Admitting: Family Medicine

## 2021-12-09 VITALS — BP 132/70 | HR 76 | Temp 97.7°F | Ht 62.0 in | Wt 162.2 lb

## 2021-12-09 DIAGNOSIS — E1122 Type 2 diabetes mellitus with diabetic chronic kidney disease: Secondary | ICD-10-CM | POA: Diagnosis not present

## 2021-12-09 DIAGNOSIS — Z8673 Personal history of transient ischemic attack (TIA), and cerebral infarction without residual deficits: Secondary | ICD-10-CM | POA: Insufficient documentation

## 2021-12-09 DIAGNOSIS — E785 Hyperlipidemia, unspecified: Secondary | ICD-10-CM | POA: Diagnosis not present

## 2021-12-09 DIAGNOSIS — I428 Other cardiomyopathies: Secondary | ICD-10-CM

## 2021-12-09 DIAGNOSIS — J449 Chronic obstructive pulmonary disease, unspecified: Secondary | ICD-10-CM | POA: Diagnosis not present

## 2021-12-09 DIAGNOSIS — N1832 Chronic kidney disease, stage 3b: Secondary | ICD-10-CM | POA: Diagnosis not present

## 2021-12-09 DIAGNOSIS — I480 Paroxysmal atrial fibrillation: Secondary | ICD-10-CM

## 2021-12-09 DIAGNOSIS — D509 Iron deficiency anemia, unspecified: Secondary | ICD-10-CM | POA: Diagnosis not present

## 2021-12-09 DIAGNOSIS — I10 Essential (primary) hypertension: Secondary | ICD-10-CM | POA: Diagnosis not present

## 2021-12-09 MED ORDER — CETIRIZINE HCL 5 MG PO TABS: 5.0000 mg | ORAL_TABLET | Freq: Every day | ORAL | 0 refills | Status: DC

## 2021-12-09 MED ORDER — ALBUTEROL SULFATE HFA 108 (90 BASE) MCG/ACT IN AERS: 2.0000 | INHALATION_SPRAY | Freq: Four times a day (QID) | RESPIRATORY_TRACT | 3 refills | Status: DC | PRN

## 2021-12-09 NOTE — Assessment & Plan Note (Signed)
Stable.  Continue carvedilol and Eliquis. ?

## 2021-12-09 NOTE — Assessment & Plan Note (Signed)
BP well controlled.  Continue current meds. °

## 2021-12-09 NOTE — Progress Notes (Signed)
? ?Subjective:  ?Patient ID: Leslie Kim, female    DOB: 1935/10/14  Age: 86 y.o. MRN: [id number] ? ?CC: ?Chief Complaint  ?Patient presents with  ? Establish Care  ?  Pt was a former patient at [facility] office but was unhappy with them. Pt taking Instital to help keep liver clean and sugars at bay; using quarter of spoon in coffee.  ? ? ?HPI: ? ?86 year old female with an extensive past medical history including atrial fibrillation, hypertension, history of nonischemic cardiomyopathy with recent echo with normal EF and grade 1 diastolic dysfunction, COPD, GERD, type 2 diabetes, chronic kidney disease, anemia presents to establish care with me. ? ?Patient had a stroke in March.  She states that she is doing well.  She still does get quite fatigued with activity. ? ?Atrial fibrillation is stable on carvedilol and Eliquis. ? ?Type 2 diabetes is well controlled.  Patient is currently on Janumet.  Based on most recent renal function from her hospitalization, this needs to be adjusted as the max dose is recommended for current GFR is 500 mg twice daily of metformin. ? ?Hypertension is well controlled on losartan, Lasix, carvedilol, spironolactone. ? ?Lipids are uncontrolled.  Patient was recently started on Lipitor.  She has been taking half of the prescribed dose as she states that she got "cramps" with 40 mg. ? ?COPD is stable on Trelegy.  Patient is requesting a refill of her albuterol inhaler. ? ?Patient noted to be anemic on review of prior blood work.  She is on iron therapy.  However, she is also on anticoagulation.  Needs repeat labs. ? ?Patient Active Problem List  ? Diagnosis Date Noted  ? History of stroke 12/09/2021  ? Hyperlipidemia 12/09/2021  ? Essential hypertension   ? Microcytic anemia 12/12/2019  ? CKD (chronic kidney disease) 12/12/2019  ? Chronic obstructive pulmonary disease ([place]) 10/21/2019  ? Gastroesophageal reflux disease without esophagitis 10/21/2019  ? Atrial fibrillation ([place]) 01/07/2016   ? Nonischemic cardiomyopathy ([place]) 12/24/2015  ? Diabetes mellitus ([place]) 12/18/2015  ? ? ?Social Hx   ?Social History  ? ?Socioeconomic History  ? Marital status: Married  ?  Spouse name: Not on file  ? Number of children: Not on file  ? Years of education: Not on file  ? Highest education level: Not on file  ?Occupational History  ? Not on file  ?Tobacco Use  ? Smoking status: Former  ?  Packs/day: 1.00  ?  Years: 40.00  ?  Pack years: 40.00  ?  Types: Cigarettes  ?  Quit date: 12/18/2007  ?  Years since quitting: 13.9  ? Smokeless tobacco: Never  ? Tobacco comments:  ?  Quit for 8 years  ?Vaping Use  ? Vaping Use: Never used  ?Substance and Sexual Activity  ? Alcohol use: No  ?  Alcohol/week: 0.0 standard drinks  ? Drug use: No  ? Sexual activity: Yes  ?Other Topics Concern  ? Not on file  ?Social History Narrative  ? Not on file  ? ?Social Determinants of Health  ? ?Financial Resource Strain: Not on file  ?Food Insecurity: Not on file  ?Transportation Needs: Not on file  ?Physical Activity: Not on file  ?Stress: Not on file  ?Social Connections: Not on file  ? ? ?Review of Systems  ?Constitutional:  Positive for fatigue.  ?Respiratory: Negative.    ?Cardiovascular: Negative.   ? ?Objective:  ?BP 132/70   Pulse 76   Temp 97.7 ?F (36.5 ?C)  Ht _0  (1.575 m)   Wt 162 lb 3.2 oz (73.6 kg)   SpO2 96%   BMI 29.67 kg/m?  ? ? ?  12/09/2021  ? 10:08 AM 10/25/2021  ?  3:42 AM 10/24/2021  ?  7:43 PM  ?BP/Weight  ?Systolic BP 989 211 941  ?Diastolic BP 70 59 51  ?Wt. (Lbs) 162.2    ?BMI 29.67 kg/m2    ? ? ?Physical Exam ?Vitals and nursing note reviewed.  ?Constitutional:   ?   General: She is not in acute distress. ?   Appearance: Normal appearance.  ?HENT:  ?   Head: Normocephalic and atraumatic.  ?Eyes:  ?   General:     ?   Right eye: No discharge.     ?   Left eye: No discharge.  ?   Conjunctiva/sclera: Conjunctivae normal.  ?Cardiovascular:  ?   Rate and Rhythm: Normal rate and regular rhythm.  ?Pulmonary:  ?    Effort: Pulmonary effort is normal.  ?   Breath sounds: Normal breath sounds. No wheezing or rales.  ?Abdominal:  ?   General: There is no distension.  ?   Palpations: Abdomen is soft.  ?   Tenderness: There is no abdominal tenderness.  ?Neurological:  ?   Mental Status: She is alert.  ?Psychiatric:     ?   Mood and Affect: Mood normal.     ?   Behavior: Behavior normal.  ? ? ?Lab Results  ?Component Value Date  ? WBC 10.3 10/18/2021  ? HGB 10.9 (L) 10/18/2021  ? HCT 34.0 (L) 10/18/2021  ? PLT 224 10/18/2021  ? GLUCOSE 179 (H) 10/25/2021  ? CHOL 231 (H) 10/14/2021  ? TRIG 142 10/14/2021  ? HDL 51 10/14/2021  ? LDLCALC 152 (H) 10/14/2021  ? ALT 18 10/18/2021  ? AST 19 10/18/2021  ? NA 132 (L) 10/25/2021  ? K 4.5 10/25/2021  ? CL 99 10/25/2021  ? CREATININE 1.27 (H) 10/25/2021  ? BUN 23 10/25/2021  ? CO2 24 10/25/2021  ? TSH 3.460 11/20/2019  ? INR 1.2 10/13/2021  ? HGBA1C 6.3 (H) 10/15/2021  ? ? ? ?Assessment & Plan:  ? ?Problem List Items Addressed This Visit   ? ?  ? Cardiovascular and Mediastinum  ? Atrial fibrillation ([place])  ?  Stable.  Continue carvedilol and Eliquis. ? ?  ?  ? Essential hypertension - Primary  ?  BP well controlled.  Continue current meds. ? ?  ?  ? Relevant Orders  ? CMP14+EGFR  ? Nonischemic cardiomyopathy ([place])  ?  Recent echo revealed grade 1 diastolic dysfunction and normal EF.  Continue current medications.  Euvolemic today. ? ?  ?  ?  ? Respiratory  ? Chronic obstructive pulmonary disease (HCC)  ?  Stable.  Continue Trelegy.  Albuterol refilled. ? ?  ?  ? Relevant Medications  ? albuterol (VENTOLIN HFA) 108 (90 Base) MCG/ACT inhaler  ? cetirizine (ZYRTEC) 5 MG tablet  ?  ? Endocrine  ? Diabetes mellitus ([place])  ?  Labs today.  We will likely have to dose adjust her Janumet based on renal function. ? ?  ?  ?  ? Other  ? Hyperlipidemia  ?  Unsure of control.  Patient is to continue Lipitor.  Assessing with lipid panel today. ? ?  ?  ? Relevant Orders  ? Lipid panel  ? Microcytic anemia  ?  Relevant Orders  ? CBC  ? ? ?Meds ordered  this encounter  ?Medications  ? albuterol (VENTOLIN HFA) 108 (90 Base) MCG/ACT inhaler  ?  Sig: Inhale 2 puffs into the lungs every 6 (six) hours as needed for wheezing or shortness of breath.  ?  Dispense:  18 g  ?  Refill:  3  ? cetirizine (ZYRTEC) 5 MG tablet  ?  Sig: Take 1 tablet (5 mg total) by mouth daily.  ?  Dispense:  90 tablet  ?  Refill:  0  ? ? ?Follow-up: 3 to 6 months ? ?Thersa Salt DO ?[place] ? ?

## 2021-12-09 NOTE — Assessment & Plan Note (Signed)
Stable.  Continue Trelegy.  Albuterol refilled. ?

## 2021-12-09 NOTE — Assessment & Plan Note (Signed)
Unsure of control.  Patient is to continue Lipitor.  Assessing with lipid panel today. ?

## 2021-12-09 NOTE — Assessment & Plan Note (Signed)
Labs today.  We will likely have to dose adjust her Janumet based on renal function. ?

## 2021-12-09 NOTE — Assessment & Plan Note (Signed)
Recent echo revealed grade 1 diastolic dysfunction and normal EF.  Continue current medications.  Euvolemic today. ?

## 2021-12-09 NOTE — Patient Instructions (Addendum)
Labs today. ? ?Zyrtec if needed.  ? ?Follow up in 3-6 months. ? ?Take care ? ?Dr. Adriana Simas ?

## 2021-12-10 LAB — CMP14+EGFR
ALT: 25 IU/L (ref 0–32)
AST: 34 IU/L (ref 0–40)
Albumin/Globulin Ratio: 1.4 (ref 1.2–2.2)
Albumin: 4.3 g/dL (ref 3.6–4.6)
Alkaline Phosphatase: 134 IU/L — ABNORMAL HIGH (ref 44–121)
BUN/Creatinine Ratio: 20 (ref 12–28)
BUN: 21 mg/dL (ref 8–27)
Bilirubin Total: 0.2 mg/dL (ref 0.0–1.2)
CO2: 22 mmol/L (ref 20–29)
Calcium: 9.6 mg/dL (ref 8.7–10.3)
Chloride: 98 mmol/L (ref 96–106)
Creatinine, Ser: 1.07 mg/dL — ABNORMAL HIGH (ref 0.57–1.00)
Globulin, Total: 3.1 g/dL (ref 1.5–4.5)
Glucose: 120 mg/dL — ABNORMAL HIGH (ref 70–99)
Potassium: 4.3 mmol/L (ref 3.5–5.2)
Sodium: 136 mmol/L (ref 134–144)
Total Protein: 7.4 g/dL (ref 6.0–8.5)
eGFR: 52 mL/min/{1.73_m2} — ABNORMAL LOW (ref >59)

## 2021-12-10 LAB — LIPID PANEL
Chol/HDL Ratio: 2.3 ratio (ref 0.0–4.4)
Cholesterol, Total: 147 mg/dL (ref 100–199)
HDL: 64 mg/dL (ref >39)
LDL Chol Calc (NIH): 59 mg/dL (ref 0–99)
Triglycerides: 144 mg/dL (ref 0–149)
VLDL Cholesterol Cal: 24 mg/dL (ref 5–40)

## 2021-12-10 LAB — CBC
Hematocrit: 33.5 % — ABNORMAL LOW (ref 34.0–46.6)
Hemoglobin: 11 g/dL — ABNORMAL LOW (ref 11.1–15.9)
MCH: 27.2 pg (ref 26.6–33.0)
MCHC: 32.8 g/dL (ref 31.5–35.7)
MCV: 83 fL (ref 79–97)
Platelets: 260 10*3/uL (ref 150–450)
RBC: 4.05 x10E6/uL (ref 3.77–5.28)
RDW: 14.1 % (ref 11.7–15.4)
WBC: 7.4 10*3/uL (ref 3.4–10.8)

## 2021-12-12 ENCOUNTER — Other Ambulatory Visit: Payer: Self-pay

## 2021-12-12 DIAGNOSIS — I10 Essential (primary) hypertension: Secondary | ICD-10-CM

## 2021-12-12 DIAGNOSIS — N1832 Chronic kidney disease, stage 3b: Secondary | ICD-10-CM

## 2021-12-12 NOTE — Progress Notes (Signed)
Labs ordered.

## 2021-12-23 ENCOUNTER — Telehealth: Payer: Self-pay | Admitting: *Deleted

## 2021-12-23 ENCOUNTER — Other Ambulatory Visit: Payer: Self-pay

## 2021-12-23 ENCOUNTER — Telehealth: Payer: Self-pay

## 2021-12-23 ENCOUNTER — Telehealth: Payer: Self-pay | Admitting: Cardiology

## 2021-12-23 DIAGNOSIS — E1122 Type 2 diabetes mellitus with diabetic chronic kidney disease: Secondary | ICD-10-CM | POA: Diagnosis not present

## 2021-12-23 MED ORDER — FLUTICASONE-UMECLIDIN-VILANT 100-62.5-25 MCG/ACT IN AEPB: 1.0000 | INHALATION_SPRAY | Freq: Every day | RESPIRATORY_TRACT | 0 refills | Status: DC

## 2021-12-23 NOTE — Telephone Encounter (Signed)
*  STAT* If patient is at the pharmacy, call can be transferred to refill team.   1. Which medications need to be refilled? (please list name of each medication and dose if known)  TRELEGY ELLIPTA 100-62.5-25 MCG/INH AEPB  2. Which pharmacy/location (including street and city if local pharmacy) is medication to be sent to? [id number] - [place], [place] - [address]  3. Do they need a 30 day or 90 day supply? 30 with refills  Patient's inhaler is empty and she is out of medication

## 2021-12-23 NOTE — Telephone Encounter (Signed)
Medication refill request approved for Trelegy. Pt aware that further refills must come from PCP

## 2021-12-24 ENCOUNTER — Encounter: Payer: Self-pay | Admitting: Family Medicine

## 2021-12-24 ENCOUNTER — Other Ambulatory Visit: Payer: Self-pay | Admitting: Family Medicine

## 2021-12-24 LAB — GAMMA GT: GGT: 185 IU/L — ABNORMAL HIGH (ref 0–60)

## 2021-12-24 MED ORDER — FLUTICASONE-UMECLIDIN-VILANT 100-62.5-25 MCG/ACT IN AEPB: 1.0000 | INHALATION_SPRAY | Freq: Every day | RESPIRATORY_TRACT | 3 refills | Status: DC

## 2021-12-27 ENCOUNTER — Other Ambulatory Visit: Payer: Self-pay

## 2021-12-27 DIAGNOSIS — R748 Abnormal levels of other serum enzymes: Secondary | ICD-10-CM

## 2021-12-27 NOTE — Telephone Encounter (Signed)
Spoke with patient regarding lab results and [place] recommendations .

## 2021-12-27 NOTE — Telephone Encounter (Signed)
Pt picked up prescription.

## 2022-01-05 ENCOUNTER — Other Ambulatory Visit: Payer: Self-pay | Admitting: Cardiology

## 2022-01-09 ENCOUNTER — Ambulatory Visit (HOSPITAL_COMMUNITY)
Admission: RE | Admit: 2022-01-09 | Discharge: 2022-01-09 | Disposition: A | Discharge status: Home / Self Care | Payer: Medicare Other | Source: Ambulatory Visit | Attending: Family Medicine | Admitting: Family Medicine

## 2022-01-09 ENCOUNTER — Ambulatory Visit: Payer: Medicare Other | Admitting: Neurology

## 2022-01-09 DIAGNOSIS — R748 Abnormal levels of other serum enzymes: Secondary | ICD-10-CM | POA: Insufficient documentation

## 2022-01-09 DIAGNOSIS — R945 Abnormal results of liver function studies: Secondary | ICD-10-CM | POA: Diagnosis not present

## 2022-01-10 ENCOUNTER — Other Ambulatory Visit: Payer: Self-pay | Admitting: Cardiology

## 2022-01-23 ENCOUNTER — Ambulatory Visit: Payer: Medicare Other | Admitting: Cardiology

## 2022-01-23 ENCOUNTER — Encounter: Payer: Self-pay | Admitting: Cardiology

## 2022-01-23 VITALS — BP 152/72 | HR 76 | Ht 63.0 in | Wt 162.0 lb

## 2022-01-23 DIAGNOSIS — E782 Mixed hyperlipidemia: Secondary | ICD-10-CM

## 2022-01-23 DIAGNOSIS — Z8679 Personal history of other diseases of the circulatory system: Secondary | ICD-10-CM | POA: Diagnosis not present

## 2022-01-23 DIAGNOSIS — I480 Paroxysmal atrial fibrillation: Secondary | ICD-10-CM

## 2022-01-23 NOTE — Progress Notes (Signed)
Cardiology Office Note  Date: 01/23/2022   ID: Leslie Kim, DOB [birth year], MRN [id number]  PCP:  [name], DO  Cardiologist:  Leslie Dell, MD Electrophysiologist:  None   Chief Complaint  Patient presents with   Cardiac follow-up    History of Present Illness: Leslie Kim is an 86 y.o. female last seen in December 2022.  She is here for a follow-up visit.  Records indicate hospitalization in March with stroke, she was continued on Eliquis at that time.  Brain MRI indicated a small vessel infarct in the left corona radiata, nonobstructive ICA atherosclerosis.  Echocardiogram revealed normal LVEF at 55 to 60%.  She reports resolution of right hand clumsiness and weakness since March.  From a cardiac perspective, no sense of palpitations and no chest pain.  We went over her medications, she has had myalgias on Lipitor despite cutting it back from 40 mg daily to 20 mg daily.  We discussed switching to Crestor after washout.  I reviewed her interval lab work.  Past Medical History:  Diagnosis Date   Allergy    Cataract    COPD (chronic obstructive pulmonary disease) (HCC)    Essential hypertension    New onset atrial fibrillation (HCC) 11/2015   a. s/p DCCV on 12/18/2015 b. started on Eliquis   Nonischemic cardiomyopathy (HCC)    a. 11/2015: echo showing EF of 30-35%, cath with nonobstructive dz   Type 2 diabetes mellitus (HCC)     Past Surgical History:  Procedure Laterality Date   CARDIAC CATHETERIZATION N/A 12/23/2015   Procedure: Right/Left Heart Cath and Coronary Angiography;  Surgeon: Laurey Morale, MD;  Location: [facility] INVASIVE CV LAB;  Service: Cardiovascular;  Laterality: N/A;   cataract surgery     CHOLECYSTECTOMY      Current Outpatient Medications  Medication Sig Dispense Refill   acetaminophen (TYLENOL) 325 MG tablet Take 2 tablets (650 mg total) by mouth every 4 (four) hours as needed for mild pain (or temp > 37.5 C (99.5 F)).     albuterol  (VENTOLIN HFA) 108 (90 Base) MCG/ACT inhaler Inhale 2 puffs into the lungs every 6 (six) hours as needed for wheezing or shortness of breath. 18 g 3   apixaban (ELIQUIS) 5 MG TABS tablet TAKE (1) TABLET BY MOUTH TWICE DAILY. 60 tablet 11   b complex vitamins tablet Take 1 tablet by mouth every other day.     carvedilol (COREG) 6.25 MG tablet Take 1 tablet (6.25 mg total) by mouth 2 (two) times daily with a meal. 60 tablet 0   cetirizine (ZYRTEC) 5 MG tablet Take 1 tablet (5 mg total) by mouth daily. 90 tablet 0   cholecalciferol (VITAMIN D3) 25 MCG (1000 UNIT) tablet Take 2 tablets (2,000 Units total) by mouth daily. 30 tablet 0   esomeprazole (NEXIUM) 20 MG capsule Take 1 capsule (20 mg total) by mouth daily. 30 capsule 0   ferrous sulfate 324 MG TBEC Take 1 tablet (324 mg total) by mouth daily with breakfast. 30 tablet 1   Fluticasone-Umeclidin-Vilant 100-62.5-25 MCG/ACT AEPB Inhale 1 puff into the lungs daily in the afternoon. 60 each 3   furosemide (LASIX) 20 MG tablet 1/2 tab daily 30 tablet 11   losartan (COZAAR) 50 MG tablet TAKE 1 TABLET(50 MG) BY MOUTH TWICE DAILY 180 tablet 3   Magnesium 250 MG TABS Take 1 tablet by mouth daily.     melatonin 3 MG TABS tablet Take 2 tablets (6 mg  total) by mouth at bedtime. 60 tablet 1   sitaGLIPtin-metformin (JANUMET) 50-1000 MG tablet Take 1 tablet by mouth 2 (two) times daily. 180 tablet 1   spironolactone (ALDACTONE) 25 MG tablet TAKE 1/2 TABLET(12.5 MG) BY MOUTH EVERY DAY 45 tablet 3   No current facility-administered medications for this visit.   Allergies:  Codeine   ROS: No palpitations or syncope.  Uses a cane.  Physical Exam: VS:  BP (!) 152/72   Pulse 76   Ht 5\' 3"  (1.6 m)   Wt 162 lb (73.5 kg)   SpO2 95%   BMI 28.70 kg/m , BMI Body mass index is 28.7 kg/m.  Wt Readings from Last 3 Encounters:  01/23/22 162 lb (73.5 kg)  12/09/21 162 lb 3.2 oz (73.6 kg)  10/24/21 158 lb 4.6 oz (71.8 kg)    General: Patient appears comfortable  at rest. HEENT: Conjunctiva and lids normal. Neck: Supple, no elevated JVP or carotid bruits, no thyromegaly. Lungs: Clear to auscultation, nonlabored breathing at rest. Cardiac: Regular rate and rhythm, no S3, 1/6 systolic murmur. Extremities: No pitting edema.  ECG:  An ECG dated 10/13/2021 was personally reviewed today and demonstrated:  Sinus rhythm with left bundle branch block.  Recent Labwork: 10/19/2021: Magnesium 1.8 12/09/2021: ALT 25; AST 34; BUN 21; Creatinine, Ser 1.07; Hemoglobin 11.0; Platelets 260; Potassium 4.3; Sodium 136     Component Value Date/Time   CHOL 147 12/09/2021 1050   TRIG 144 12/09/2021 1050   HDL 64 12/09/2021 1050   CHOLHDL 2.3 12/09/2021 1050   CHOLHDL 4.5 10/14/2021 0342   VLDL 28 10/14/2021 0342   LDLCALC 59 12/09/2021 1050    Other Studies Reviewed Today:  Echocardiogram 10/14/2021:  1. Left ventricular ejection fraction, by estimation, is 55 to 60%. The  left ventricle has normal function. Left ventricular endocardial border  not optimally defined to evaluate regional wall motion. There is mild left  ventricular hypertrophy. Left  ventricular diastolic parameters are consistent with Grade I diastolic  dysfunction (impaired relaxation). Elevated left atrial pressure.   2. Right ventricular systolic function is normal. The right ventricular  size is normal. There is normal pulmonary artery systolic pressure.   3. The mitral valve is abnormal. Mild mitral valve regurgitation. Mild  mitral stenosis.   4. The tricuspid valve is abnormal.   5. The aortic valve was not well visualized. There is mild calcification  of the aortic valve. There is mild thickening of the aortic valve. Aortic  valve regurgitation is not visualized. No aortic stenosis is present.   6. The inferior vena cava is normal in size with greater than 50%  respiratory variability, suggesting right atrial pressure of 3 mmHg.   Assessment and Plan:  1.  History of nonischemic  cardiomyopathy with normalization of LVEF, most recently 55 to 60% by follow-up echocardiogram in March.  Currently on Cozaar, Aldactone, and Coreg.  Had prior intolerance to [facility].  No changes were made today.  2.  Paroxysmal atrial fibrillation with CHA2DS2-VASc score of 7.  Interval stroke noted although symptoms have resolved to baseline.  Continues on Eliquis.  I reviewed her most recent lab work from May.  3.  Hyperlipidemia, myalgias on Lipitor.  We will plan to switch to Crestor.  Medication Adjustments/Labs and Tests Ordered: Current medicines are reviewed at length with the patient today.  Concerns regarding medicines are outlined above.   Tests Ordered: No orders of the defined types were placed in this encounter.   Medication Changes:  No orders of the defined types were placed in this encounter.   Disposition:  Follow up  6 months.  Signed, Jonelle Sidle, MD, [facility] 01/23/2022 2:18 PM    [facility] [address] [place], [place], [place] [address] Phone: [phone]; Fax: [phone]

## 2022-01-24 ENCOUNTER — Ambulatory Visit (INDEPENDENT_AMBULATORY_CARE_PROVIDER_SITE_OTHER): Payer: Medicare Other

## 2022-01-24 VITALS — Ht 63.0 in | Wt 162.0 lb

## 2022-01-24 DIAGNOSIS — Z0000 Encounter for general adult medical examination without abnormal findings: Secondary | ICD-10-CM | POA: Diagnosis not present

## 2022-02-28 ENCOUNTER — Encounter: Payer: Self-pay | Admitting: Nurse Practitioner

## 2022-02-28 ENCOUNTER — Ambulatory Visit (INDEPENDENT_AMBULATORY_CARE_PROVIDER_SITE_OTHER): Payer: Medicare Other | Admitting: Nurse Practitioner

## 2022-02-28 VITALS — BP 169/79 | HR 98 | Temp 97.3°F | Ht 63.0 in | Wt 162.0 lb

## 2022-02-28 DIAGNOSIS — J329 Chronic sinusitis, unspecified: Secondary | ICD-10-CM | POA: Diagnosis not present

## 2022-02-28 DIAGNOSIS — J310 Chronic rhinitis: Secondary | ICD-10-CM

## 2022-02-28 MED ORDER — AMOXICILLIN 875 MG PO TABS: 875.0000 mg | ORAL_TABLET | Freq: Two times a day (BID) | ORAL | 0 refills | Status: AC

## 2022-02-28 NOTE — Progress Notes (Signed)
Productive cough, sore throat, no fever, pulled a tick off of her Saturday, tired headache.

## 2022-02-28 NOTE — Progress Notes (Signed)
Subjective:    Patient ID: Leslie Kim, female    DOB: April 23, 1936, 86 y.o.   MRN: [id number]  HPI  86 year old female patient presents with daughter with complaints of productive cough with gray to yellow sputum, sore throat, and intermittent headache over 3 to 4 days.  Patient denies fever, body aches, chills, shortness of breath, wheezing, difficulty breathing, ear pain.  Patient denies the possibility of COVID as she states that she stays in the house majority of the time.   Review of Systems  HENT:  Positive for congestion and sore throat.   Respiratory:  Positive for cough.   Neurological:  Positive for headaches.       Objective:   Physical Exam Vitals reviewed.  Constitutional:      General: She is not in acute distress.    Appearance: Normal appearance. She is normal weight. She is not ill-appearing, toxic-appearing or diaphoretic.  HENT:     Head: Normocephalic and atraumatic.     Right Ear: Tympanic membrane, ear canal and external ear normal. There is no impacted cerumen.     Left Ear: Tympanic membrane, ear canal and external ear normal. There is no impacted cerumen.     Nose: Nose normal.     Mouth/Throat:     Mouth: Mucous membranes are moist.     Pharynx: No oropharyngeal exudate or posterior oropharyngeal erythema.  Eyes:     General:        Right eye: No discharge.        Left eye: No discharge.     Extraocular Movements: Extraocular movements intact.     Pupils: Pupils are equal, round, and reactive to light.  Cardiovascular:     Rate and Rhythm: Normal rate and regular rhythm.     Pulses: Normal pulses.     Heart sounds: Normal heart sounds. No murmur heard. Pulmonary:     Effort: Pulmonary effort is normal. No respiratory distress.     Breath sounds: Normal breath sounds. No wheezing.  Musculoskeletal:     Cervical back: Normal range of motion and neck supple. No rigidity or tenderness.     Comments: Grossly intact  Lymphadenopathy:      Cervical: No cervical adenopathy.  Skin:    General: Skin is warm.     Capillary Refill: Capillary refill takes less than 2 seconds.  Neurological:     Mental Status: She is alert.     Comments: Grossly intact  Psychiatric:        Mood and Affect: Mood normal.        Behavior: Behavior normal.           Assessment & Plan:   1. Rhinosinusitis -Likely viral however due to patient's age and history of pneumonia will prescribe amoxicillin. -Most viruses last 5 to 7 days -Patient instructed to wait until day 5 or 7 and if symptoms have not improved by then she may take antibiotic - amoxicillin (AMOXIL) 875 MG tablet; Take 1 tablet (875 mg total) by mouth 2 (two) times daily for 10 days.  Dispense: 20 tablet; Refill: 0 -Return to clinic if symptoms do not improve or worsen or if you develop fever difficulty breathing shortness of breath or wheezing.    Note:  This document was prepared using Dragon voice recognition software and may include unintentional dictation errors. Note - This record has been created using [employer].  Chart creation errors have been sought, but may not always  have been  located. Such creation errors do not reflect on  the standard of medical care.

## 2022-03-07 ENCOUNTER — Encounter
Payer: Medicare Other | Attending: Physical Medicine and Rehabilitation | Admitting: Physical Medicine and Rehabilitation

## 2022-03-07 ENCOUNTER — Encounter: Payer: Self-pay | Admitting: Physical Medicine and Rehabilitation

## 2022-03-07 VITALS — BP 135/70 | HR 76 | Ht 63.0 in | Wt 163.0 lb

## 2022-03-07 DIAGNOSIS — G63 Polyneuropathy in diseases classified elsewhere: Secondary | ICD-10-CM | POA: Insufficient documentation

## 2022-03-07 DIAGNOSIS — J449 Chronic obstructive pulmonary disease, unspecified: Secondary | ICD-10-CM | POA: Diagnosis not present

## 2022-03-07 NOTE — Progress Notes (Signed)
Subjective:    Patient ID: Leslie Kim, female    DOB: July 20, 1936, 86 y.o.   MRN: [id number]  HPI Leslie Kim is a an 86 year old woman who presents for follow-up after CIR admission   1) Fatigue - feels tired more easily -denies shortness of breath -uses Trilogy every night for COPD.  -she takes iron, B12, folate, B1 -she takes Vitamin D 1,000mg  daily.   2) Sore throat and cough last week  3) Neuropathy -that keeps her awake half the night -she takes magnesium at night -feels her feet her tender  Pain Inventory Average Pain 0 Pain Right Now 0 My pain is  no pain  LOCATION OF PAIN  no pain   BOWEL Number of stools per week: 7   BLADDER Normal  Bladder incontinence Yes  Frequent urination Yes  Leakage with coughing Yes    Mobility use a cane how many minutes can you walk? 10 ability to climb steps?  yes do you drive?  no  Function retired  Neuro/Psych No problems in this area  Prior Studies Any changes since last visit?  no  Physicians involved in your care Any changes since last visit?  no   Family History  Problem Relation Age of Onset   Diabetes Mother    Diabetes Father    Cancer Father    Prostate cancer Father    Diabetes Brother    Diabetes Brother    Diabetes Brother    Diabetes Daughter    Diabetes Son    Social History   Socioeconomic History   Marital status: Married    Spouse name: Not on file   Number of children: Not on file   Years of education: Not on file   Highest education level: Not on file  Occupational History   Not on file  Tobacco Use   Smoking status: Former    Packs/day: 1.00    Years: 40.00    Total pack years: 40.00    Types: Cigarettes    Quit date: 12/18/2007    Years since quitting: 14.2   Smokeless tobacco: Never   Tobacco comments:    Quit for 8 years  Vaping Use   Vaping Use: Never used  Substance and Sexual Activity   Alcohol use: No    Alcohol/week: 0.0 standard drinks of alcohol    Drug use: No   Sexual activity: Yes  Other Topics Concern   Not on file  Social History Narrative   Not on file   Social Determinants of Health   Financial Resource Strain: Low Risk  (01/24/2022)   Overall Financial Resource Strain (CARDIA)    Difficulty of Paying Living Expenses: Not hard at all  Food Insecurity: No Food Insecurity (01/24/2022)   Hunger Vital Sign    Worried About Running Out of Food in the Last Year: Never true    Ran Out of Food in the Last Year: Never true  Transportation Needs: No Transportation Needs (01/24/2022)   PRAPARE - Administrator, Civil Service (Medical): No    Lack of Transportation (Non-Medical): No  Physical Activity: Insufficiently Active (01/24/2022)   Exercise Vital Sign    Days of Exercise per Week: 5 days    Minutes of Exercise per Session: 20 min  Stress: No Stress Concern Present (01/24/2022)   [employer] of Occupational Health - Occupational Stress Questionnaire    Feeling of Stress : Not at all  Social Connections: Moderately Integrated (01/24/2022)  Social Connection and Isolation Panel [NHANES]    Frequency of Communication with Friends and Family: More than three times a week    Frequency of Social Gatherings with Friends and Family: More than three times a week    Attends Religious Services: 1 to 4 times per year    Active Member of [employer] or Organizations: No    Attends Banker Meetings: Never    Marital Status: Married   Past Surgical History:  Procedure Laterality Date   CARDIAC CATHETERIZATION N/A 12/23/2015   Procedure: Right/Left Heart Cath and Coronary Angiography;  Surgeon: Laurey Morale, MD;  Location: [facility] INVASIVE CV LAB;  Service: Cardiovascular;  Laterality: N/A;   cataract surgery     CHOLECYSTECTOMY     Past Medical History:  Diagnosis Date   Allergy    Cataract    COPD (chronic obstructive pulmonary disease) (HCC)    Essential hypertension    New onset atrial fibrillation (HCC)  11/2015   a. s/p DCCV on 12/18/2015 b. started on Eliquis   Nonischemic cardiomyopathy (HCC)    a. 11/2015: echo showing EF of 30-35%, cath with nonobstructive dz   Type 2 diabetes mellitus (HCC)    BP 135/70   Pulse 76   Ht 5\' 3"  (1.6 m)   Wt 163 lb (73.9 kg)   SpO2 96%   BMI 28.87 kg/m   Opioid Risk Score:   Fall Risk Score:  `1  Depression screen [facility] 2/9     03/07/2022    2:55 PM 01/24/2022    9:10 AM 12/09/2021   10:08 AM 11/26/2019    1:12 PM 10/21/2019   10:57 AM 09/10/2019    2:50 PM  Depression screen PHQ 2/9  Decreased Interest 0 0 0 0 0 0  Down, Depressed, Hopeless 0 0 0 0 0 0  PHQ - 2 Score 0 0 0 0 0 0  Altered sleeping 0       Tired, decreased energy 0       Change in appetite 0       Feeling bad or failure about yourself  0       Trouble concentrating 0       Moving slowly or fidgety/restless 0       Suicidal thoughts 0       PHQ-9 Score 0       Difficult doing work/chores Not difficult at all           Review of Systems  All other systems reviewed and are negative.     Objective:   Physical Exam Gen: no distress, normal appearing HEENT: oral mucosa pink and moist, NCAT Cardio: Reg rate Chest: normal effort, normal rate of breathing Abd: soft, non-distended Ext: no edema Psych: pleasant, normal affect Skin: intact Neuro: Alert and oriented x3     Assessment & Plan:   1) Diabetic peripheral neuropathy -Discussed Qutenza as an option for neuropathic pain control. Discussed that this is a capsaicin patch, stronger than capsaicin cream. Discussed that it is currently approved for diabetic peripheral neuropathy and post-herpetic neuralgia, but that it has also shown benefit in treating other forms of neuropathy. Provided patient with link to site to learn more about the patch: 4/9. Discussed that the patch would be placed in office and benefits usually last 3 months. Discussed that unintended exposure to capsaicin can cause severe  irritation of eyes, mucous membranes, respiratory tract, and skin, but that Qutenza is a local treatment and does  not have the systemic side effects of other nerve medications. Discussed that there may be pain, itching, erythema, and decreased sensory function associated with the application of Qutenza. Side effects usually subside within 1 week. A cold pack of analgesic medications can help with these side effects. Blood pressure can also be increased due to pain associated with administration of the patch.   2) Fatigue 2/2 COPD, recent stroke -discussed risks and benefits of modafinil.

## 2022-03-14 ENCOUNTER — Encounter: Payer: Self-pay | Admitting: Family Medicine

## 2022-03-14 ENCOUNTER — Ambulatory Visit (INDEPENDENT_AMBULATORY_CARE_PROVIDER_SITE_OTHER): Payer: Medicare Other | Admitting: Family Medicine

## 2022-03-14 ENCOUNTER — Ambulatory Visit (HOSPITAL_COMMUNITY)
Admission: RE | Admit: 2022-03-14 | Discharge: 2022-03-14 | Disposition: A | Discharge status: Home / Self Care | Payer: Medicare Other | Source: Ambulatory Visit | Attending: Family Medicine | Admitting: Family Medicine

## 2022-03-14 VITALS — BP 132/58 | HR 73 | Temp 98.2°F | Ht 63.0 in | Wt 161.0 lb

## 2022-03-14 DIAGNOSIS — I10 Essential (primary) hypertension: Secondary | ICD-10-CM

## 2022-03-14 DIAGNOSIS — R052 Subacute cough: Secondary | ICD-10-CM | POA: Insufficient documentation

## 2022-03-14 DIAGNOSIS — E119 Type 2 diabetes mellitus without complications: Secondary | ICD-10-CM

## 2022-03-14 DIAGNOSIS — J189 Pneumonia, unspecified organism: Secondary | ICD-10-CM

## 2022-03-14 DIAGNOSIS — J449 Chronic obstructive pulmonary disease, unspecified: Secondary | ICD-10-CM | POA: Diagnosis not present

## 2022-03-14 DIAGNOSIS — E785 Hyperlipidemia, unspecified: Secondary | ICD-10-CM

## 2022-03-14 DIAGNOSIS — R059 Cough, unspecified: Secondary | ICD-10-CM | POA: Diagnosis not present

## 2022-03-14 DIAGNOSIS — J029 Acute pharyngitis, unspecified: Secondary | ICD-10-CM | POA: Diagnosis not present

## 2022-03-14 MED ORDER — AMOXICILLIN-POT CLAVULANATE 875-125 MG PO TABS: 1.0000 | ORAL_TABLET | Freq: Two times a day (BID) | ORAL | 0 refills | Status: DC

## 2022-03-14 MED ORDER — JANUMET 50-500 MG PO TABS: 1.0000 | ORAL_TABLET | Freq: Two times a day (BID) | ORAL | 1 refills | Status: DC

## 2022-03-14 MED ORDER — AZITHROMYCIN 250 MG PO TABS: ORAL_TABLET | ORAL | 0 refills | Status: DC

## 2022-03-14 MED ORDER — ROSUVASTATIN CALCIUM 5 MG PO TABS: 5.0000 mg | ORAL_TABLET | Freq: Every day | ORAL | 1 refills | Status: DC

## 2022-03-14 MED ORDER — FLUTICASONE-UMECLIDIN-VILANT 100-62.5-25 MCG/ACT IN AEPB
1.0000 | INHALATION_SPRAY | Freq: Every day | RESPIRATORY_TRACT | 3 refills | Status: DC
Start: 2022-03-14 — End: 2022-11-17

## 2022-03-14 NOTE — Assessment & Plan Note (Addendum)
Stable.  Continue current medications.

## 2022-03-14 NOTE — Assessment & Plan Note (Signed)
Restarting Crestor

## 2022-03-14 NOTE — Assessment & Plan Note (Signed)
Trelegy refilled.

## 2022-03-14 NOTE — Progress Notes (Signed)
Subjective:  Patient ID: Leslie Kim, female    DOB: 1935-11-11  Age: 86 y.o. MRN: [id number]  CC: Chief Complaint  Patient presents with   3 month follow up     Chronic medical conditions Had recent cold and still has a lot of drainage and a cough, request check ears - Refill trillegy and discuss janumet      HPI:  86 year old female with atrial fibrillation on chronic Eliquis, hypertension, nonischemic cardiomyopathy with most recent echo revealing normal ejection fraction, COPD, GERD, type 2 diabetes, CKD, history of stroke, hyperlipidemia, anemia presents for follow-up.  Patient reports that she was recently sick.  She reports ongoing cough.  She states that she has had the cough for the past couple of weeks.  Seems to be more prominent in the morning.  Has some baseline shortness of breath but no increasing shortness of breath.  Regarding her COPD, she needs refill on Trelegy.  She states that she uses albuterol sparingly.  Trelegy works well.  Hypertension is stable on Lasix, spironolactone, losartan.  Patient states that she has stopped her Crestor due to cramping.  Patient reports that she was on 40 mg daily.  The prescription that we have on record is 5 mg daily.  We will discuss restarting.  Patient states that she would like to decrease the dosing of her Janumet.  We have previously discussed this given her fluctuating renal function.  Patient Active Problem List   Diagnosis Date Noted   Subacute cough 03/14/2022   History of stroke 12/09/2021   Hyperlipidemia 12/09/2021   Essential hypertension    Microcytic anemia 12/12/2019   CKD (chronic kidney disease) 12/12/2019   Chronic obstructive pulmonary disease (HCC) 10/21/2019   Gastroesophageal reflux disease without esophagitis 10/21/2019   Atrial fibrillation (HCC) 01/07/2016   Nonischemic cardiomyopathy (HCC) 12/24/2015   Diabetes mellitus (HCC) 12/18/2015    Social Hx   Social History   Socioeconomic  History   Marital status: Married    Spouse name: Not on file   Number of children: Not on file   Years of education: Not on file   Highest education level: Not on file  Occupational History   Not on file  Tobacco Use   Smoking status: Former    Packs/day: 1.00    Years: 40.00    Total pack years: 40.00    Types: Cigarettes    Quit date: 12/18/2007    Years since quitting: 14.2   Smokeless tobacco: Never   Tobacco comments:    Quit for 8 years  Vaping Use   Vaping Use: Never used  Substance and Sexual Activity   Alcohol use: No    Alcohol/week: 0.0 standard drinks of alcohol   Drug use: No   Sexual activity: Yes  Other Topics Concern   Not on file  Social History Narrative   Not on file   Social Determinants of Health   Financial Resource Strain: Low Risk  (01/24/2022)   Overall Financial Resource Strain (CARDIA)    Difficulty of Paying Living Expenses: Not hard at all  Food Insecurity: No Food Insecurity (01/24/2022)   Hunger Vital Sign    Worried About Running Out of Food in the Last Year: Never true    Ran Out of Food in the Last Year: Never true  Transportation Needs: No Transportation Needs (01/24/2022)   PRAPARE - Administrator, Civil Service (Medical): No    Lack of Transportation (Non-Medical): No  Physical  Activity: Insufficiently Active (01/24/2022)   Exercise Vital Sign    Days of Exercise per Week: 5 days    Minutes of Exercise per Session: 20 min  Stress: No Stress Concern Present (01/24/2022)   [employer] of Occupational Health - Occupational Stress Questionnaire    Feeling of Stress : Not at all  Social Connections: Moderately Integrated (01/24/2022)   Social Connection and Isolation Panel [NHANES]    Frequency of Communication with Friends and Family: More than three times a week    Frequency of Social Gatherings with Friends and Family: More than three times a week    Attends Religious Services: 1 to 4 times per year    Active  Member of [employer] or Organizations: No    Attends Engineer, structural: Never    Marital Status: Married    Review of Systems Per HPI  Objective:  BP (!) 132/58   Pulse 73   Temp 98.2 F (36.8 C)   Ht 5\' 3"  (1.6 m)   Wt 161 lb (73 kg)   SpO2 97%   BMI 28.52 kg/m      03/14/2022   10:14 AM 03/07/2022    2:52 PM 02/28/2022    3:53 PM  BP/Weight  Systolic BP 132 135 169  Diastolic BP 58 70 79  Wt. (Lbs) 161 163 162  BMI 28.52 kg/m2 28.87 kg/m2 28.7 kg/m2    Physical Exam Vitals and nursing note reviewed.  Constitutional:      General: She is not in acute distress.    Appearance: Normal appearance.  HENT:     Head: Normocephalic and atraumatic.  Eyes:     General:        Right eye: No discharge.        Left eye: No discharge.     Conjunctiva/sclera: Conjunctivae normal.  Cardiovascular:     Rate and Rhythm: Normal rate and regular rhythm.  Pulmonary:     Effort: Pulmonary effort is normal.     Breath sounds: Normal breath sounds. No wheezing or rales.  Neurological:     Mental Status: She is alert.  Psychiatric:        Mood and Affect: Mood normal.        Behavior: Behavior normal.     Lab Results  Component Value Date   WBC 7.4 12/09/2021   HGB 11.0 (L) 12/09/2021   HCT 33.5 (L) 12/09/2021   PLT 260 12/09/2021   GLUCOSE 120 (H) 12/09/2021   CHOL 147 12/09/2021   TRIG 144 12/09/2021   HDL 64 12/09/2021   LDLCALC 59 12/09/2021   ALT 25 12/09/2021   AST 34 12/09/2021   NA 136 12/09/2021   K 4.3 12/09/2021   CL 98 12/09/2021   CREATININE 1.07 (H) 12/09/2021   BUN 21 12/09/2021   CO2 22 12/09/2021   TSH 3.460 11/20/2019   INR 1.2 10/13/2021   HGBA1C 6.3 (H) 10/15/2021     Assessment & Plan:   Problem List Items Addressed This Visit       Cardiovascular and Mediastinum   Essential hypertension - Primary    Stable.  Continue current medications.      Relevant Medications   rosuvastatin (CRESTOR) 5 MG tablet     Respiratory    Chronic obstructive pulmonary disease (HCC)    Trelegy refilled.      Relevant Medications   Fluticasone-Umeclidin-Vilant 100-62.5-25 MCG/ACT AEPB   azithromycin (ZITHROMAX) 250 MG tablet     Endocrine  Diabetes mellitus (HCC)    Stable.  Decreasing dose of Janumet.      Relevant Medications   rosuvastatin (CRESTOR) 5 MG tablet   sitaGLIPtin-metformin (JANUMET) 50-500 MG tablet     Other   Hyperlipidemia    Restarting Crestor.      Relevant Medications   rosuvastatin (CRESTOR) 5 MG tablet   Subacute cough    Chest x-ray was obtained and revealed lingular opacity concerning for pneumonia.  Placing on Augmentin and azithromycin.      Relevant Orders   DG Chest 2 View (Completed)    Meds ordered this encounter  Medications   Fluticasone-Umeclidin-Vilant 100-62.5-25 MCG/ACT AEPB    Sig: Inhale 1 puff into the lungs daily in the afternoon.    Dispense:  60 each    Refill:  3   rosuvastatin (CRESTOR) 5 MG tablet    Sig: Take 1 tablet (5 mg total) by mouth daily.    Dispense:  90 tablet    Refill:  1   sitaGLIPtin-metformin (JANUMET) 50-500 MG tablet    Sig: Take 1 tablet by mouth 2 (two) times daily with a meal.    Dispense:  180 tablet    Refill:  1   amoxicillin-clavulanate (AUGMENTIN) 875-125 MG tablet    Sig: Take 1 tablet by mouth 2 (two) times daily.    Dispense:  14 tablet    Refill:  0   azithromycin (ZITHROMAX) 250 MG tablet    Sig: 2 tablets on day 1, then 1 tablet daily on days 2-5.    Dispense:  6 tablet    Refill:  [place]

## 2022-03-14 NOTE — Addendum Note (Signed)
Addended by: Margaretha Sheffield on: 03/14/2022 02:03 PM   Modules accepted: Orders

## 2022-03-14 NOTE — Patient Instructions (Signed)
I have refilled the Trelegy and decreased the dose of the Janumet.  Chest xray today.  Follow up in 3-6 months.  Take care  Dr. Adriana Simas

## 2022-03-14 NOTE — Assessment & Plan Note (Signed)
Stable.  Decreasing dose of Janumet.

## 2022-03-14 NOTE — Assessment & Plan Note (Signed)
Chest x-ray was obtained and revealed lingular opacity concerning for pneumonia.  Placing on Augmentin and azithromycin.

## 2022-04-13 ENCOUNTER — Inpatient Hospital Stay: Payer: Medicare Other | Admitting: Physical Medicine and Rehabilitation

## 2022-04-17 DIAGNOSIS — H524 Presbyopia: Secondary | ICD-10-CM | POA: Diagnosis not present

## 2022-04-17 DIAGNOSIS — H52201 Unspecified astigmatism, right eye: Secondary | ICD-10-CM | POA: Diagnosis not present

## 2022-04-17 DIAGNOSIS — E119 Type 2 diabetes mellitus without complications: Secondary | ICD-10-CM | POA: Diagnosis not present

## 2022-04-17 DIAGNOSIS — Z8673 Personal history of transient ischemic attack (TIA), and cerebral infarction without residual deficits: Secondary | ICD-10-CM | POA: Diagnosis not present

## 2022-04-17 DIAGNOSIS — H5211 Myopia, right eye: Secondary | ICD-10-CM | POA: Diagnosis not present

## 2022-04-17 DIAGNOSIS — Z961 Presence of intraocular lens: Secondary | ICD-10-CM | POA: Diagnosis not present

## 2022-04-17 DIAGNOSIS — Z7984 Long term (current) use of oral hypoglycemic drugs: Secondary | ICD-10-CM | POA: Diagnosis not present

## 2022-04-17 LAB — HM DIABETES EYE EXAM

## 2022-05-09 ENCOUNTER — Encounter: Payer: Self-pay | Admitting: *Deleted

## 2022-05-31 ENCOUNTER — Encounter: Payer: Self-pay | Admitting: Family Medicine

## 2022-05-31 ENCOUNTER — Other Ambulatory Visit: Payer: Self-pay | Admitting: Family Medicine

## 2022-05-31 MED ORDER — CARVEDILOL 6.25 MG PO TABS: 6.2500 mg | ORAL_TABLET | Freq: Two times a day (BID) | ORAL | 3 refills | Status: DC

## 2022-06-14 ENCOUNTER — Ambulatory Visit (INDEPENDENT_AMBULATORY_CARE_PROVIDER_SITE_OTHER): Payer: Medicare Other | Admitting: Family Medicine

## 2022-06-14 ENCOUNTER — Encounter: Payer: Self-pay | Admitting: Family Medicine

## 2022-06-14 VITALS — BP 148/82 | HR 72 | Temp 97.2°F | Wt 169.4 lb

## 2022-06-14 DIAGNOSIS — I10 Essential (primary) hypertension: Secondary | ICD-10-CM

## 2022-06-14 DIAGNOSIS — E1122 Type 2 diabetes mellitus with diabetic chronic kidney disease: Secondary | ICD-10-CM

## 2022-06-14 DIAGNOSIS — J449 Chronic obstructive pulmonary disease, unspecified: Secondary | ICD-10-CM

## 2022-06-14 DIAGNOSIS — N1831 Chronic kidney disease, stage 3a: Secondary | ICD-10-CM

## 2022-06-14 DIAGNOSIS — N1832 Chronic kidney disease, stage 3b: Secondary | ICD-10-CM | POA: Diagnosis not present

## 2022-06-14 DIAGNOSIS — E785 Hyperlipidemia, unspecified: Secondary | ICD-10-CM | POA: Diagnosis not present

## 2022-06-14 NOTE — Patient Instructions (Signed)
Labs today.  Follow up in 6 months.  Take care  Dr. Makensie Mulhall  

## 2022-06-15 LAB — CMP14+EGFR
ALT: 11 IU/L (ref 0–32)
AST: 17 IU/L (ref 0–40)
Albumin/Globulin Ratio: 1.3 (ref 1.2–2.2)
Albumin: 4.5 g/dL (ref 3.7–4.7)
Alkaline Phosphatase: 74 IU/L (ref 44–121)
BUN/Creatinine Ratio: 22 (ref 12–28)
BUN: 29 mg/dL — ABNORMAL HIGH (ref 8–27)
Bilirubin Total: 0.3 mg/dL (ref 0.0–1.2)
CO2: 21 mmol/L (ref 20–29)
Calcium: 10 mg/dL (ref 8.7–10.3)
Chloride: 95 mmol/L — ABNORMAL LOW (ref 96–106)
Creatinine, Ser: 1.3 mg/dL — ABNORMAL HIGH (ref 0.57–1.00)
Globulin, Total: 3.4 g/dL (ref 1.5–4.5)
Glucose: 112 mg/dL — ABNORMAL HIGH (ref 70–99)
Potassium: 5.2 mmol/L (ref 3.5–5.2)
Sodium: 135 mmol/L (ref 134–144)
Total Protein: 7.9 g/dL (ref 6.0–8.5)
eGFR: 41 mL/min/{1.73_m2} — ABNORMAL LOW (ref >59)

## 2022-06-15 LAB — CBC
Hematocrit: 37.1 % (ref 34.0–46.6)
Hemoglobin: 12 g/dL (ref 11.1–15.9)
MCH: 27.4 pg (ref 26.6–33.0)
MCHC: 32.3 g/dL (ref 31.5–35.7)
MCV: 85 fL (ref 79–97)
Platelets: 218 10*3/uL (ref 150–450)
RBC: 4.38 x10E6/uL (ref 3.77–5.28)
RDW: 14.2 % (ref 11.7–15.4)
WBC: 9.4 10*3/uL (ref 3.4–10.8)

## 2022-06-15 LAB — LIPID PANEL
Chol/HDL Ratio: 3.9 ratio (ref 0.0–4.4)
Cholesterol, Total: 232 mg/dL — ABNORMAL HIGH (ref 100–199)
HDL: 59 mg/dL (ref >39)
LDL Chol Calc (NIH): 123 mg/dL — ABNORMAL HIGH (ref 0–99)
Triglycerides: 288 mg/dL — ABNORMAL HIGH (ref 0–149)
VLDL Cholesterol Cal: 50 mg/dL — ABNORMAL HIGH (ref 5–40)

## 2022-06-15 LAB — HEMOGLOBIN A1C
Est. average glucose Bld gHb Est-mCnc: 151 mg/dL
Hgb A1c MFr Bld: 6.9 % — ABNORMAL HIGH (ref 4.8–5.6)

## 2022-06-15 LAB — MICROALBUMIN / CREATININE URINE RATIO
Creatinine, Urine: 25.4 mg/dL
Microalb/Creat Ratio: 339 mg/g creat — ABNORMAL HIGH (ref 0–29)
Microalbumin, Urine: 86 ug/mL

## 2022-06-15 NOTE — Assessment & Plan Note (Signed)
A1c at goal.  We will need to monitor her renal function closely.  Continue Janumet.

## 2022-06-15 NOTE — Assessment & Plan Note (Signed)
BP elevated today.  Patient is on multiple medications.  We will continue to monitor closely.  Advised to monitor closely at home.

## 2022-06-15 NOTE — Assessment & Plan Note (Signed)
Need to ensure compliance.  If compliant with Crestor we will need dose increase.

## 2022-06-15 NOTE — Assessment & Plan Note (Signed)
Stable. Continue Trelegy. 

## 2022-06-15 NOTE — Progress Notes (Signed)
Subjective:  Patient ID: Leslie Kim, female    DOB: 1935/10/21  Age: 86 y.o. MRN: [id number]  CC: Chief Complaint  Patient presents with   Hypertension    Blood pressure has been good. Pt would like PCP to look at left elbow.    Diabetes    Hardly ever checks sugars. No s/s of hyper/hypoglycemia     HPI:  86 year old female with the below mentioned medical problems presents for follow-up.  BP mildly elevated today.  She is on carvedilol, Lasix, losartan and spironolactone.  Follows with cardiology.  A1c at goal.  She is currently on Janumet 50/500 twice daily.  Patient's GFR has dropped below 45.  We will need to monitor closely.  Lipids have been well controlled.  However, lipid panel obtained on 11/15 reveals poor control.  She is supposed to be taking Crestor 5 mg daily.  COPD stable on Trelegy.  She does note that she has been using albuterol more frequently since the weather change.  Patient Active Problem List   Diagnosis Date Noted   History of stroke 12/09/2021   Hyperlipidemia 12/09/2021   Essential hypertension    Microcytic anemia 12/12/2019   CKD (chronic kidney disease) 12/12/2019   Chronic obstructive pulmonary disease ([place]) 10/21/2019   Gastroesophageal reflux disease without esophagitis 10/21/2019   Atrial fibrillation ([place]) 01/07/2016   Nonischemic cardiomyopathy ([place]) 12/24/2015   Diabetes mellitus ([place]) 12/18/2015    Social Hx   Social History   Socioeconomic History   Marital status: Married    Spouse name: Not on file   Number of children: Not on file   Years of education: Not on file   Highest education level: Not on file  Occupational History   Not on file  Tobacco Use   Smoking status: Former    Packs/day: 1.00    Years: 40.00    Total pack years: 40.00    Types: Cigarettes    Quit date: 12/18/2007    Years since quitting: 14.5   Smokeless tobacco: Never   Tobacco comments:    Quit for 8 years  Vaping Use   Vaping Use: Never  used  Substance and Sexual Activity   Alcohol use: No    Alcohol/week: 0.0 standard drinks of alcohol   Drug use: No   Sexual activity: Yes  Other Topics Concern   Not on file  Social History Narrative   Not on file   Social Determinants of Health   Financial Resource Strain: Low Risk  (01/24/2022)   Overall Financial Resource Strain (CARDIA)    Difficulty of Paying Living Expenses: Not hard at all  Food Insecurity: No Food Insecurity (01/24/2022)   Hunger Vital Sign    Worried About Running Out of Food in the Last Year: Never true    Ran Out of Food in the Last Year: Never true  Transportation Needs: No Transportation Needs (01/24/2022)   PRAPARE - Hydrologist (Medical): No    Lack of Transportation (Non-Medical): No  Physical Activity: Insufficiently Active (01/24/2022)   Exercise Vital Sign    Days of Exercise per Week: 5 days    Minutes of Exercise per Session: 20 min  Stress: No Stress Concern Present (01/24/2022)   [place]    Feeling of Stress : Not at all  Social Connections: Moderately Integrated (01/24/2022)   Social Connection and Isolation Panel [NHANES]    Frequency of Communication with  Friends and Family: More than three times a week    Frequency of Social Gatherings with Friends and Family: More than three times a week    Attends Religious Services: 1 to 4 times per year    Active Member of [employer] or Organizations: No    Attends Music therapist: Never    Marital Status: Married    Review of Systems Per HPI  Objective:  BP (!) 148/82   Pulse 72   Temp (!) 97.2 F (36.2 C)   Wt 169 lb 6.4 oz (76.8 kg)   SpO2 98%   BMI 30.01 kg/m      06/14/2022   11:04 AM 03/14/2022   10:14 AM 03/07/2022    2:52 PM  BP/Weight  Systolic BP 528 413 244  Diastolic BP 82 58 70  Wt. (Lbs) 169.4 161 163  BMI 30.01 kg/m2 28.52 kg/m2 28.87 kg/m2    Physical  Exam Vitals and nursing note reviewed.  Constitutional:      Appearance: Normal appearance. She is obese.  HENT:     Head: Normocephalic and atraumatic.  Eyes:     General:        Right eye: No discharge.        Left eye: No discharge.     Conjunctiva/sclera: Conjunctivae normal.  Cardiovascular:     Rate and Rhythm: Normal rate and regular rhythm.  Pulmonary:     Effort: Pulmonary effort is normal.     Breath sounds: Normal breath sounds. No wheezing, rhonchi or rales.  Neurological:     Mental Status: She is alert.  Psychiatric:        Mood and Affect: Mood normal.        Behavior: Behavior normal.     Lab Results  Component Value Date   WBC 9.4 06/14/2022   HGB 12.0 06/14/2022   HCT 37.1 06/14/2022   PLT 218 06/14/2022   GLUCOSE 112 (H) 06/14/2022   CHOL 232 (H) 06/14/2022   TRIG 288 (H) 06/14/2022   HDL 59 06/14/2022   LDLCALC 123 (H) 06/14/2022   ALT 11 06/14/2022   AST 17 06/14/2022   NA 135 06/14/2022   K 5.2 06/14/2022   CL 95 (L) 06/14/2022   CREATININE 1.30 (H) 06/14/2022   BUN 29 (H) 06/14/2022   CO2 21 06/14/2022   TSH 3.460 11/20/2019   INR 1.2 10/13/2021   HGBA1C 6.9 (H) 06/14/2022     Assessment & Plan:   Problem List Items Addressed This Visit       Cardiovascular and Mediastinum   Essential hypertension - Primary    BP elevated today.  Patient is on multiple medications.  We will continue to monitor closely.  Advised to monitor closely at home.        Respiratory   Chronic obstructive pulmonary disease (HCC)    Stable.  Continue Trelegy.        Endocrine   Diabetes mellitus ([place])    A1c at goal.  We will need to monitor her renal function closely.  Continue Janumet.      Relevant Orders   Hemoglobin A1c (Completed)   Microalbumin / creatinine urine ratio (Completed)     Genitourinary   CKD (chronic kidney disease)   Relevant Orders   CBC (Completed)   CMP14+EGFR (Completed)     Other   Hyperlipidemia    Need to ensure  compliance.  If compliant with Crestor we will need dose increase.  Relevant Orders   Lipid panel (Completed)    Follow-up:  6 months  [place] DO [place]

## 2022-06-19 ENCOUNTER — Other Ambulatory Visit: Payer: Self-pay

## 2022-06-19 DIAGNOSIS — N1831 Chronic kidney disease, stage 3a: Secondary | ICD-10-CM

## 2022-07-17 DIAGNOSIS — N1831 Chronic kidney disease, stage 3a: Secondary | ICD-10-CM | POA: Diagnosis not present

## 2022-07-18 LAB — BASIC METABOLIC PANEL
BUN/Creatinine Ratio: 19 (ref 12–28)
BUN: 22 mg/dL (ref 8–27)
CO2: 21 mmol/L (ref 20–29)
Calcium: 10.4 mg/dL — ABNORMAL HIGH (ref 8.7–10.3)
Chloride: 96 mmol/L (ref 96–106)
Creatinine, Ser: 1.13 mg/dL — ABNORMAL HIGH (ref 0.57–1.00)
Glucose: 109 mg/dL — ABNORMAL HIGH (ref 70–99)
Potassium: 4.5 mmol/L (ref 3.5–5.2)
Sodium: 136 mmol/L (ref 134–144)
eGFR: 48 mL/min/{1.73_m2} — ABNORMAL LOW (ref >59)

## 2022-07-19 ENCOUNTER — Ambulatory Visit: Payer: Medicare Other | Attending: Cardiology | Admitting: Cardiology

## 2022-07-19 ENCOUNTER — Encounter: Payer: Self-pay | Admitting: Cardiology

## 2022-07-19 VITALS — BP 140/70 | HR 74 | Ht 63.0 in | Wt 171.0 lb

## 2022-07-19 DIAGNOSIS — M791 Myalgia, unspecified site: Secondary | ICD-10-CM | POA: Diagnosis not present

## 2022-07-19 DIAGNOSIS — I480 Paroxysmal atrial fibrillation: Secondary | ICD-10-CM | POA: Diagnosis not present

## 2022-07-19 DIAGNOSIS — E782 Mixed hyperlipidemia: Secondary | ICD-10-CM | POA: Diagnosis not present

## 2022-07-19 DIAGNOSIS — T466X5A Adverse effect of antihyperlipidemic and antiarteriosclerotic drugs, initial encounter: Secondary | ICD-10-CM | POA: Diagnosis not present

## 2022-07-19 DIAGNOSIS — Z8679 Personal history of other diseases of the circulatory system: Secondary | ICD-10-CM

## 2022-07-19 NOTE — Progress Notes (Signed)
Cardiology Office Note  Date: 07/19/2022   ID: [name], DOB 04/16/1936, MRN [id number]  PCP:  Leslie Sams, DO  Cardiologist:  Leslie Dell, MD Electrophysiologist:  None   Chief Complaint  Patient presents with   Cardiac follow-up    History of Present Illness: Leslie Kim is an 86 y.o. female last seen in June.  She is here for a routine visit.  Reports NYHA class II dyspnea, no exertional chest pain or sense of palpitations.  We went over her medications today.  She reports compliance with Eliquis, no spontaneous bleeding problems.  Otherwise remains on Coreg, Cozaar, and Aldactone.  Echocardiogram in March revealed LVEF 55 to 60% range.  She did come off statin therapy with persistent myalgias that have subsequently resolved.  She had trouble with Lipitor and Crestor.  I reviewed her interval lab work.  Past Medical History:  Diagnosis Date   Allergy    Cataract    COPD (chronic obstructive pulmonary disease) (HCC)    Essential hypertension    New onset atrial fibrillation (HCC) 11/2015   a. s/p DCCV on 12/18/2015 b. started on Eliquis   Nonischemic cardiomyopathy (HCC)    a. 11/2015: echo showing EF of 30-35%, cath with nonobstructive dz   Type 2 diabetes mellitus (HCC)     Past Surgical History:  Procedure Laterality Date   CARDIAC CATHETERIZATION N/A 12/23/2015   Procedure: Right/Left Heart Cath and Coronary Angiography;  Surgeon: Leslie Morale, MD;  Location: [facility] INVASIVE CV LAB;  Service: Cardiovascular;  Laterality: N/A;   cataract surgery     CHOLECYSTECTOMY      Current Outpatient Medications  Medication Sig Dispense Refill   acetaminophen (TYLENOL) 325 MG tablet Take 2 tablets (650 mg total) by mouth every 4 (four) hours as needed for mild pain (or temp > 37.5 C (99.5 F)).     albuterol (VENTOLIN HFA) 108 (90 Base) MCG/ACT inhaler Inhale 2 puffs into the lungs every 6 (six) hours as needed for wheezing or shortness of breath. 18 g 3    apixaban (ELIQUIS) 5 MG TABS tablet TAKE (1) TABLET BY MOUTH TWICE DAILY. 60 tablet 11   b complex vitamins tablet Take 1 tablet by mouth every other day.     carvedilol (COREG) 6.25 MG tablet Take 1 tablet (6.25 mg total) by mouth 2 (two) times daily with a meal. 180 tablet 3   cholecalciferol (VITAMIN D3) 25 MCG (1000 UNIT) tablet Take 2 tablets (2,000 Units total) by mouth daily. 30 tablet 0   esomeprazole (NEXIUM) 20 MG capsule Take 1 capsule (20 mg total) by mouth daily. 30 capsule 0   ferrous sulfate 324 MG TBEC Take 1 tablet (324 mg total) by mouth daily with breakfast. 30 tablet 1   Fluticasone-Umeclidin-Vilant 100-62.5-25 MCG/ACT AEPB Inhale 1 puff into the lungs daily in the afternoon. 60 each 3   furosemide (LASIX) 20 MG tablet 1/2 tab daily 30 tablet 11   losartan (COZAAR) 50 MG tablet TAKE 1 TABLET(50 MG) BY MOUTH TWICE DAILY 180 tablet 3   Magnesium 250 MG TABS Take 1 tablet by mouth daily.     melatonin 3 MG TABS tablet Take 2 tablets (6 mg total) by mouth at bedtime. 60 tablet 1   sitaGLIPtin-metformin (JANUMET) 50-500 MG tablet Take 1 tablet by mouth 2 (two) times daily with a meal. 180 tablet 1   spironolactone (ALDACTONE) 25 MG tablet TAKE 1/2 TABLET(12.5 MG) BY MOUTH EVERY DAY 45 tablet 3  No current facility-administered medications for this visit.   Allergies:  Codeine   ROS:  No syncope.  Physical Exam: VS:  BP (!) 140/70 (BP Location: Left Arm, Patient Position: Sitting, Cuff Size: Normal)   Pulse 74   Ht 5\' 3"  (1.6 m)   Wt 171 lb (77.6 kg)   SpO2 98%   BMI 30.29 kg/m , BMI Body mass index is 30.29 kg/m.  Wt Readings from Last 3 Encounters:  07/19/22 171 lb (77.6 kg)  06/14/22 169 lb 6.4 oz (76.8 kg)  03/14/22 161 lb (73 kg)    General: Patient appears comfortable at rest. HEENT: Conjunctiva and lids normal. Neck: Supple, no elevated JVP or carotid bruits. Lungs: Clear to auscultation, nonlabored breathing at rest. Cardiac: Regular rate and rhythm, no  S3, 1/6 systolic murmur. Extremities: No pitting edema.  ECG:  An ECG dated 10/13/2021 was personally reviewed today and demonstrated:  Left bundle branch block.  Recent Labwork: 10/19/2021: Magnesium 1.8 06/14/2022: ALT 11; AST 17; Hemoglobin 12.0; Platelets 218 07/17/2022: BUN 22; Creatinine, Ser 1.13; Potassium 4.5; Sodium 136     Component Value Date/Time   CHOL 232 (H) 06/14/2022 1140   TRIG 288 (H) 06/14/2022 1140   HDL 59 06/14/2022 1140   CHOLHDL 3.9 06/14/2022 1140   CHOLHDL 4.5 10/14/2021 0342   VLDL 28 10/14/2021 0342   LDLCALC 123 (H) 06/14/2022 1140    Other Studies Reviewed Today:  Echocardiogram 10/14/2021:  1. Left ventricular ejection fraction, by estimation, is 55 to 60%. The  left ventricle has normal function. Left ventricular endocardial border  not optimally defined to evaluate regional wall motion. There is mild left  ventricular hypertrophy. Left  ventricular diastolic parameters are consistent with Grade I diastolic  dysfunction (impaired relaxation). Elevated left atrial pressure.   2. Right ventricular systolic function is normal. The right ventricular  size is normal. There is normal pulmonary artery systolic pressure.   3. The mitral valve is abnormal. Mild mitral valve regurgitation. Mild  mitral stenosis.   4. The tricuspid valve is abnormal.   5. The aortic valve was not well visualized. There is mild calcification  of the aortic valve. There is mild thickening of the aortic valve. Aortic  valve regurgitation is not visualized. No aortic stenosis is present.   6. The inferior vena cava is normal in size with greater than 50%  respiratory variability, suggesting right atrial pressure of 3 mmHg.   Assessment and Plan:  1.  HFrecEF with history of nonischemic cardiomyopathy and normal LVEF at 55 to 60% by echocardiogram in March.  She is clinically stable on Cozaar, Coreg, and Aldactone.  No changes were made today.  2.  Paroxysmal atrial  fibrillation with CHA2DS2-VASc score of 7.  She remains on Eliquis for stroke prophylaxis and reports no interval palpitations.  Heart rate is regular today.  3.  Mixed hyperlipidemia with statin myalgias including Lipitor and Crestor.  She did not want to consider other medications today, but we will continue to discuss this.  Last LDL was 123 in November.  Medication Adjustments/Labs and Tests Ordered: Current medicines are reviewed at length with the patient today.  Concerns regarding medicines are outlined above.   Tests Ordered: No orders of the defined types were placed in this encounter.   Medication Changes: No orders of the defined types were placed in this encounter.   Disposition:  Follow up  6 months.  Signed, December, MD, [facility] 07/19/2022 4:04 PM  [place] [facility] [address] [place], [place] [address] Phone: [phone]; Fax: [phone]

## 2022-07-19 NOTE — Patient Instructions (Signed)
Medication Instructions:  Your physician recommends that you continue on your current medications as directed. Please refer to the Current Medication list given to you today.   Labwork: None  Testing/Procedures: None  Follow-Up: Follow up with Dr. McDowell in 6 months.   Any Other Special Instructions Will Be Listed Below (If Applicable).     If you need a refill on your cardiac medications before your next appointment, please call your pharmacy.  

## 2022-09-14 ENCOUNTER — Ambulatory Visit (INDEPENDENT_AMBULATORY_CARE_PROVIDER_SITE_OTHER): Payer: Medicare Other | Admitting: Family Medicine

## 2022-09-14 ENCOUNTER — Encounter: Payer: Self-pay | Admitting: Family Medicine

## 2022-09-14 DIAGNOSIS — J449 Chronic obstructive pulmonary disease, unspecified: Secondary | ICD-10-CM | POA: Diagnosis not present

## 2022-09-14 DIAGNOSIS — K219 Gastro-esophageal reflux disease without esophagitis: Secondary | ICD-10-CM | POA: Diagnosis not present

## 2022-09-14 DIAGNOSIS — I10 Essential (primary) hypertension: Secondary | ICD-10-CM

## 2022-09-14 DIAGNOSIS — Z23 Encounter for immunization: Secondary | ICD-10-CM | POA: Diagnosis not present

## 2022-09-14 DIAGNOSIS — N1832 Chronic kidney disease, stage 3b: Secondary | ICD-10-CM

## 2022-09-14 DIAGNOSIS — E1122 Type 2 diabetes mellitus with diabetic chronic kidney disease: Secondary | ICD-10-CM | POA: Diagnosis not present

## 2022-09-14 MED ORDER — PANTOPRAZOLE SODIUM 40 MG PO TBEC: 40.0000 mg | DELAYED_RELEASE_TABLET | Freq: Every day | ORAL | 3 refills | Status: DC

## 2022-09-14 MED ORDER — JANUMET 50-500 MG PO TABS: 1.0000 | ORAL_TABLET | Freq: Two times a day (BID) | ORAL | 1 refills | Status: DC

## 2022-09-14 MED ORDER — ALBUTEROL SULFATE HFA 108 (90 BASE) MCG/ACT IN AERS: 2.0000 | INHALATION_SPRAY | Freq: Four times a day (QID) | RESPIRATORY_TRACT | 3 refills | Status: DC | PRN

## 2022-09-14 NOTE — Patient Instructions (Signed)
Labs today.  Stop nexium. Start Protonix.  Albuterol and Janumet refilled.  Follow up in 3 months.

## 2022-09-15 DIAGNOSIS — K760 Fatty (change of) liver, not elsewhere classified: Secondary | ICD-10-CM | POA: Insufficient documentation

## 2022-09-15 NOTE — Assessment & Plan Note (Signed)
Stopping Nexium.  Starting Protonix.

## 2022-09-15 NOTE — Assessment & Plan Note (Signed)
A1c returned at 7.  Patient's GFR now below 45.  Will need to decrease her Janumet further.

## 2022-09-15 NOTE — Assessment & Plan Note (Signed)
Stable.  Patient requested refill on albuterol.  Refilled today.

## 2022-09-15 NOTE — Progress Notes (Signed)
Subjective:  Patient ID: Leslie Kim, female    DOB: 1935-10-05  Age: 87 y.o. MRN: [id number]  CC: Chief Complaint  Patient presents with   Hypertension    Pt arrives for 3 month follow up on HTN. No s/s HTN. Pt has continued neuropathy in feet. Usually checks pressure at home     HPI:  87 year old female with history of nonischemic cardiomyopathy, hypertension, atrial fibrillation, COPD, GERD, type 2 diabetes, and kidney disease, hyperlipidemia, history of stroke presents for follow-up.  Type 2 diabetes is stable on Janumet.  Needs assessment of renal unction today.  BP mildly elevated today.  However, this is a reasonable goal given her age.  She is on carvedilol, losartan, spironolactone, and Lasix.  Hyperlipidemia uncontrolled.  Patient is no longer on statin therapy.  She stopped her Crestor due to cramping.  Patient denies chest pain or shortness of breath.  She is doing fairly well at this time.  She does note that her Nexium does not seem to control her GERD as well that she would like.  She would like to discuss this today.  Patient Active Problem List   Diagnosis Date Noted   Hepatic steatosis 09/15/2022   History of stroke 12/09/2021   Hyperlipidemia 12/09/2021   Essential hypertension    Microcytic anemia 12/12/2019   CKD (chronic kidney disease) stage 3, GFR 30-59 ml/min (HCC) 12/12/2019   Chronic obstructive pulmonary disease ([place]) 10/21/2019   Gastroesophageal reflux disease without esophagitis 10/21/2019   Atrial fibrillation ([place]) 01/07/2016   Nonischemic cardiomyopathy ([place]) 12/24/2015   Diabetes mellitus ([place]) 12/18/2015    Social Hx   Social History   Socioeconomic History   Marital status: Married    Spouse name: Not on file   Number of children: Not on file   Years of education: Not on file   Highest education level: Not on file  Occupational History   Not on file  Tobacco Use   Smoking status: Former    Packs/day: 1.00    Years: 40.00     Total pack years: 40.00    Types: Cigarettes    Quit date: 12/18/2007    Years since quitting: 14.7   Smokeless tobacco: Never   Tobacco comments:    Quit for 8 years  Vaping Use   Vaping Use: Never used  Substance and Sexual Activity   Alcohol use: No    Alcohol/week: 0.0 standard drinks of alcohol   Drug use: No   Sexual activity: Yes  Other Topics Concern   Not on file  Social History Narrative   Not on file   Social Determinants of Health   Financial Resource Strain: Low Risk  (01/24/2022)   Overall Financial Resource Strain (CARDIA)    Difficulty of Paying Living Expenses: Not hard at all  Food Insecurity: No Food Insecurity (01/24/2022)   Hunger Vital Sign    Worried About Running Out of Food in the Last Year: Never true    Ran Out of Food in the Last Year: Never true  Transportation Needs: No Transportation Needs (01/24/2022)   PRAPARE - Hydrologist (Medical): No    Lack of Transportation (Non-Medical): No  Physical Activity: Insufficiently Active (01/24/2022)   Exercise Vital Sign    Days of Exercise per Week: 5 days    Minutes of Exercise per Session: 20 min  Stress: No Stress Concern Present (01/24/2022)   [place]  Feeling of Stress : Not at all  Social Connections: Moderately Integrated (01/24/2022)   Social Connection and Isolation Panel [NHANES]    Frequency of Communication with Friends and Family: More than three times a week    Frequency of Social Gatherings with Friends and Family: More than three times a week    Attends Religious Services: 1 to 4 times per year    Active Member of [employer] or Organizations: No    Attends Music therapist: Never    Marital Status: Married    Review of Systems Per HPI  Objective:  BP (!) 150/76   Pulse 69   Wt 172 lb 9.6 oz (78.3 kg)   SpO2 96%   BMI 30.57 kg/m      09/14/2022    1:56 PM 09/14/2022    1:51  PM 07/19/2022    3:43 PM  BP/Weight  Systolic BP [id number]  Diastolic BP 76 77 70  Wt. (Lbs)  172.6 171  BMI  30.57 kg/m2 30.29 kg/m2    Physical Exam Vitals and nursing note reviewed.  Constitutional:      General: She is not in acute distress.    Appearance: Normal appearance.  HENT:     Head: Normocephalic and atraumatic.  Cardiovascular:     Rate and Rhythm: Normal rate and regular rhythm.  Pulmonary:     Effort: Pulmonary effort is normal.     Breath sounds: Normal breath sounds. No wheezing or rales.  Neurological:     Mental Status: She is alert.  Psychiatric:        Mood and Affect: Mood normal.        Behavior: Behavior normal.     Lab Results  Component Value Date   WBC 9.4 06/14/2022   HGB 12.0 06/14/2022   HCT 37.1 06/14/2022   PLT 218 06/14/2022   GLUCOSE 116 (H) 09/14/2022   CHOL 232 (H) 06/14/2022   TRIG 288 (H) 06/14/2022   HDL 59 06/14/2022   LDLCALC 123 (H) 06/14/2022   ALT 11 06/14/2022   AST 17 06/14/2022   NA 135 09/14/2022   K 4.9 09/14/2022   CL 96 09/14/2022   CREATININE 1.22 (H) 09/14/2022   BUN 30 (H) 09/14/2022   CO2 21 09/14/2022   TSH 3.460 11/20/2019   INR 1.2 10/13/2021   HGBA1C 7.0 (H) 09/14/2022     Assessment & Plan:   Problem List Items Addressed This Visit       Cardiovascular and Mediastinum   Essential hypertension    Reasonably well-controlled.  Continue current medications.        Respiratory   Chronic obstructive pulmonary disease (HCC)    Stable.  Patient requested refill on albuterol.  Refilled today.      Relevant Medications   albuterol (VENTOLIN HFA) 108 (90 Base) MCG/ACT inhaler     Digestive   Gastroesophageal reflux disease without esophagitis    Stopping Nexium.  Starting Protonix.      Relevant Medications   pantoprazole (PROTONIX) 40 MG tablet     Endocrine   Diabetes mellitus ([place])    A1c returned at 7.  Patient's GFR now below 45.  Will need to decrease her Janumet further.       Relevant Medications   sitaGLIPtin-metformin (JANUMET) 50-500 MG tablet   Other Relevant Orders   Basic metabolic panel (Completed)   Microalbumin / creatinine urine ratio (Completed)   Hemoglobin A1c (Completed)     Genitourinary  CKD (chronic kidney disease) stage 3, GFR 30-59 ml/min (HCC)    Renal function essentially stable.  Will continue to monitor.      Other Visit Diagnoses     Need for vaccination       Relevant Orders   Pneumococcal conjugate vaccine 20-valent (Prevnar 20) (Completed)       Meds ordered this encounter  Medications   albuterol (VENTOLIN HFA) 108 (90 Base) MCG/ACT inhaler    Sig: Inhale 2 puffs into the lungs every 6 (six) hours as needed for wheezing or shortness of breath.    Dispense:  18 g    Refill:  3    Patient prefers albuterol that she has previously be given. Most recent generic is more difficult for her to use.   sitaGLIPtin-metformin (JANUMET) 50-500 MG tablet    Sig: Take 1 tablet by mouth 2 (two) times daily with a meal.    Dispense:  180 tablet    Refill:  1   pantoprazole (PROTONIX) 40 MG tablet    Sig: Take 1 tablet (40 mg total) by mouth daily.    Dispense:  90 tablet    Refill:  3    Follow-up:  Return in about 3 months (around 12/13/2022).  [place]

## 2022-09-15 NOTE — Assessment & Plan Note (Signed)
Renal function essentially stable.  Will continue to monitor.

## 2022-09-15 NOTE — Assessment & Plan Note (Signed)
Reasonably well-controlled.  Continue current medications.

## 2022-09-16 LAB — BASIC METABOLIC PANEL
BUN/Creatinine Ratio: 25 (ref 12–28)
BUN: 30 mg/dL — ABNORMAL HIGH (ref 8–27)
CO2: 21 mmol/L (ref 20–29)
Calcium: 10.1 mg/dL (ref 8.7–10.3)
Chloride: 96 mmol/L (ref 96–106)
Creatinine, Ser: 1.22 mg/dL — ABNORMAL HIGH (ref 0.57–1.00)
Glucose: 116 mg/dL — ABNORMAL HIGH (ref 70–99)
Potassium: 4.9 mmol/L (ref 3.5–5.2)
Sodium: 135 mmol/L (ref 134–144)
eGFR: 44 mL/min/{1.73_m2} — ABNORMAL LOW (ref >59)

## 2022-09-16 LAB — MICROALBUMIN / CREATININE URINE RATIO
Creatinine, Urine: 21.9 mg/dL
Microalb/Creat Ratio: 395 mg/g creat — ABNORMAL HIGH (ref 0–29)
Microalbumin, Urine: 86.6 ug/mL

## 2022-09-16 LAB — HEMOGLOBIN A1C
Est. average glucose Bld gHb Est-mCnc: 154 mg/dL
Hgb A1c MFr Bld: 7 % — ABNORMAL HIGH (ref 4.8–5.6)

## 2022-09-18 ENCOUNTER — Other Ambulatory Visit: Payer: Self-pay | Admitting: Family Medicine

## 2022-09-25 ENCOUNTER — Other Ambulatory Visit: Payer: Self-pay | Admitting: Family Medicine

## 2022-11-02 ENCOUNTER — Encounter: Payer: Self-pay | Admitting: Family Medicine

## 2022-11-03 MED ORDER — SPIRONOLACTONE 25 MG PO TABS: ORAL_TABLET | ORAL | 0 refills | Status: DC

## 2022-11-03 NOTE — Telephone Encounter (Signed)
Prescription sent electronically to pharmacy. 

## 2022-11-03 NOTE — Telephone Encounter (Signed)
Cook, Jayce G, DO     Please refill.     

## 2022-11-15 ENCOUNTER — Other Ambulatory Visit: Payer: Self-pay

## 2022-11-15 MED ORDER — APIXABAN 5 MG PO TABS: ORAL_TABLET | ORAL | 11 refills | Status: DC

## 2022-11-17 ENCOUNTER — Other Ambulatory Visit: Payer: Self-pay | Admitting: Family Medicine

## 2022-12-08 ENCOUNTER — Telehealth: Payer: Self-pay | Admitting: *Deleted

## 2022-12-08 NOTE — Telephone Encounter (Signed)
Paper rx for furosemide denied electronically Note sent to pcp

## 2022-12-13 ENCOUNTER — Ambulatory Visit (INDEPENDENT_AMBULATORY_CARE_PROVIDER_SITE_OTHER): Payer: Medicare Other | Admitting: Family Medicine

## 2022-12-13 VITALS — BP 142/62 | HR 79 | Temp 97.3°F | Ht 63.0 in | Wt 173.0 lb

## 2022-12-13 DIAGNOSIS — R809 Proteinuria, unspecified: Secondary | ICD-10-CM | POA: Diagnosis not present

## 2022-12-13 DIAGNOSIS — Z7984 Long term (current) use of oral hypoglycemic drugs: Secondary | ICD-10-CM

## 2022-12-13 DIAGNOSIS — I10 Essential (primary) hypertension: Secondary | ICD-10-CM | POA: Diagnosis not present

## 2022-12-13 DIAGNOSIS — E1122 Type 2 diabetes mellitus with diabetic chronic kidney disease: Secondary | ICD-10-CM | POA: Diagnosis not present

## 2022-12-13 DIAGNOSIS — N1832 Chronic kidney disease, stage 3b: Secondary | ICD-10-CM

## 2022-12-13 DIAGNOSIS — E785 Hyperlipidemia, unspecified: Secondary | ICD-10-CM | POA: Diagnosis not present

## 2022-12-13 MED ORDER — ALBUTEROL SULFATE (2.5 MG/3ML) 0.083% IN NEBU: 2.5000 mg | INHALATION_SOLUTION | Freq: Four times a day (QID) | RESPIRATORY_TRACT | 1 refills | Status: AC | PRN

## 2022-12-13 MED ORDER — EMPAGLIFLOZIN 10 MG PO TABS: 10.0000 mg | ORAL_TABLET | Freq: Every day | ORAL | 1 refills | Status: DC

## 2022-12-13 NOTE — Patient Instructions (Signed)
Labs tomorrow or after.  Medications as prescribed.  Follow up in 3 months.

## 2022-12-14 ENCOUNTER — Encounter: Payer: Self-pay | Admitting: Family Medicine

## 2022-12-14 ENCOUNTER — Other Ambulatory Visit: Payer: Self-pay | Admitting: Family Medicine

## 2022-12-14 DIAGNOSIS — E785 Hyperlipidemia, unspecified: Secondary | ICD-10-CM | POA: Diagnosis not present

## 2022-12-14 DIAGNOSIS — N1832 Chronic kidney disease, stage 3b: Secondary | ICD-10-CM | POA: Diagnosis not present

## 2022-12-14 DIAGNOSIS — E1122 Type 2 diabetes mellitus with diabetic chronic kidney disease: Secondary | ICD-10-CM | POA: Diagnosis not present

## 2022-12-15 LAB — CMP14+EGFR
ALT: 15 IU/L (ref 0–32)
AST: 19 IU/L (ref 0–40)
Albumin/Globulin Ratio: 1.2 (ref 1.2–2.2)
Albumin: 4.2 g/dL (ref 3.7–4.7)
Alkaline Phosphatase: 69 IU/L (ref 44–121)
BUN/Creatinine Ratio: 21 (ref 12–28)
BUN: 26 mg/dL (ref 8–27)
Bilirubin Total: 0.2 mg/dL (ref 0.0–1.2)
CO2: 22 mmol/L (ref 20–29)
Calcium: 9.8 mg/dL (ref 8.7–10.3)
Chloride: 95 mmol/L — ABNORMAL LOW (ref 96–106)
Creatinine, Ser: 1.24 mg/dL — ABNORMAL HIGH (ref 0.57–1.00)
Globulin, Total: 3.5 g/dL (ref 1.5–4.5)
Glucose: 138 mg/dL — ABNORMAL HIGH (ref 70–99)
Potassium: 5.3 mmol/L — ABNORMAL HIGH (ref 3.5–5.2)
Sodium: 133 mmol/L — ABNORMAL LOW (ref 134–144)
Total Protein: 7.7 g/dL (ref 6.0–8.5)
eGFR: 43 mL/min/{1.73_m2} — ABNORMAL LOW (ref >59)

## 2022-12-15 LAB — MICROALBUMIN / CREATININE URINE RATIO
Creatinine, Urine: 29.1 mg/dL
Microalb/Creat Ratio: 497 mg/g creat — ABNORMAL HIGH (ref 0–29)
Microalbumin, Urine: 144.6 ug/mL

## 2022-12-15 LAB — LIPID PANEL
Chol/HDL Ratio: 4.4 ratio (ref 0.0–4.4)
Cholesterol, Total: 241 mg/dL — ABNORMAL HIGH (ref 100–199)
HDL: 55 mg/dL (ref >39)
LDL Chol Calc (NIH): 142 mg/dL — ABNORMAL HIGH (ref 0–99)
Triglycerides: 245 mg/dL — ABNORMAL HIGH (ref 0–149)
VLDL Cholesterol Cal: 44 mg/dL — ABNORMAL HIGH (ref 5–40)

## 2022-12-15 LAB — HEMOGLOBIN A1C
Est. average glucose Bld gHb Est-mCnc: 154 mg/dL
Hgb A1c MFr Bld: 7 % — ABNORMAL HIGH (ref 4.8–5.6)

## 2022-12-15 LAB — CBC
Hematocrit: 37.3 % (ref 34.0–46.6)
Hemoglobin: 11.8 g/dL (ref 11.1–15.9)
MCH: 27.6 pg (ref 26.6–33.0)
MCHC: 31.6 g/dL (ref 31.5–35.7)
MCV: 87 fL (ref 79–97)
Platelets: 244 10*3/uL (ref 150–450)
RBC: 4.28 x10E6/uL (ref 3.77–5.28)
RDW: 13.4 % (ref 11.7–15.4)
WBC: 8.3 10*3/uL (ref 3.4–10.8)

## 2022-12-18 NOTE — Assessment & Plan Note (Signed)
Fair control. Continue current medications. 

## 2022-12-18 NOTE — Assessment & Plan Note (Signed)
A1c at goal.  Adding Jardiance given proteinuria.

## 2022-12-18 NOTE — Assessment & Plan Note (Signed)
Proteinuria worsening.  Referring to nephrology.  Adding Jardiance.

## 2022-12-18 NOTE — Progress Notes (Signed)
Subjective:  Patient ID: Leslie Kim, female    DOB: 30-Mar-1936  Age: 87 y.o. MRN: [id number]  CC: Chief Complaint  Patient presents with   Hypertension   Shortness of Breath    With activity   referral to foot doctor     Cut lgreat toe left foot this morning while nail trimming    HPI:  87 year old female with the below mentioned medical problems presents for follow-up.  Patient reports that she was cutting her toenails and inadvertently cut her left great toe.  She has Band-Aid in place currently.  BP mildly elevated here today.  She is on losartan, carvedilol, and spironolactone.  Patient's type 2 diabetes has been at goal.  A1c 7.  However, she continues to have marked proteinuria.  Has underlying CKD.  Will discuss addition of Jardiance today.  Patient Active Problem List   Diagnosis Date Noted   Hepatic steatosis 09/15/2022   History of stroke 12/09/2021   Hyperlipidemia 12/09/2021   Essential hypertension    CKD (chronic kidney disease) stage 3, GFR 30-59 ml/min (HCC) 12/12/2019   Chronic obstructive pulmonary disease (HCC) 10/21/2019   Gastroesophageal reflux disease without esophagitis 10/21/2019   Atrial fibrillation (HCC) 01/07/2016   Nonischemic cardiomyopathy (HCC) 12/24/2015   Diabetes mellitus (HCC) 12/18/2015    Social Hx   Social History   Socioeconomic History   Marital status: Married    Spouse name: Not on file   Number of children: Not on file   Years of education: Not on file   Highest education level: Not on file  Occupational History   Not on file  Tobacco Use   Smoking status: Former    Packs/day: 1.00    Years: 40.00    Additional pack years: 0.00    Total pack years: 40.00    Types: Cigarettes    Quit date: 12/18/2007    Years since quitting: 15.0   Smokeless tobacco: Never   Tobacco comments:    Quit for 8 years  Vaping Use   Vaping Use: Never used  Substance and Sexual Activity   Alcohol use: No    Alcohol/week: 0.0  standard drinks of alcohol   Drug use: No   Sexual activity: Yes  Other Topics Concern   Not on file  Social History Narrative   Not on file   Social Determinants of Health   Financial Resource Strain: Low Risk  (01/24/2022)   Overall Financial Resource Strain (CARDIA)    Difficulty of Paying Living Expenses: Not hard at all  Food Insecurity: No Food Insecurity (01/24/2022)   Hunger Vital Sign    Worried About Running Out of Food in the Last Year: Never true    Ran Out of Food in the Last Year: Never true  Transportation Needs: No Transportation Needs (01/24/2022)   PRAPARE - Administrator, Civil Service (Medical): No    Lack of Transportation (Non-Medical): No  Physical Activity: Insufficiently Active (01/24/2022)   Exercise Vital Sign    Days of Exercise per Week: 5 days    Minutes of Exercise per Session: 20 min  Stress: No Stress Concern Present (01/24/2022)   [employer] of Occupational Health - Occupational Stress Questionnaire    Feeling of Stress : Not at all  Social Connections: Moderately Integrated (01/24/2022)   Social Connection and Isolation Panel [NHANES]    Frequency of Communication with Friends and Family: More than three times a week    Frequency of Social  Gatherings with Friends and Family: More than three times a week    Attends Religious Services: 1 to 4 times per year    Active Member of [employer] or Organizations: No    Attends Banker Meetings: Never    Marital Status: Married    Review of Systems  Constitutional: Negative.   Respiratory:  Positive for shortness of breath.     Objective:  BP (!) 142/62   Pulse 79   Temp (!) 97.3 F (36.3 C)   Ht 5\' 3"  (1.6 m)   Wt 173 lb (78.5 kg)   SpO2 95%   BMI 30.65 kg/m      12/13/2022    2:56 PM 12/13/2022    2:02 PM 09/14/2022    1:56 PM  BP/Weight  Systolic BP 142 170 150  Diastolic BP 62 70 76  Wt. (Lbs)  173   BMI  30.65 kg/m2     Physical Exam Vitals and nursing  note reviewed.  Constitutional:      General: She is not in acute distress.    Appearance: Normal appearance.  HENT:     Head: Normocephalic and atraumatic.  Eyes:     General:        Right eye: No discharge.        Left eye: No discharge.     Conjunctiva/sclera: Conjunctivae normal.  Cardiovascular:     Rate and Rhythm: Normal rate and regular rhythm.  Pulmonary:     Effort: Pulmonary effort is normal.     Breath sounds: Normal breath sounds. No wheezing or rales.  Neurological:     Mental Status: She is alert.  Psychiatric:        Mood and Affect: Mood normal.        Behavior: Behavior normal.     Lab Results  Component Value Date   WBC 8.3 12/14/2022   HGB 11.8 12/14/2022   HCT 37.3 12/14/2022   PLT 244 12/14/2022   GLUCOSE 138 (H) 12/14/2022   CHOL 241 (H) 12/14/2022   TRIG 245 (H) 12/14/2022   HDL 55 12/14/2022   LDLCALC 142 (H) 12/14/2022   ALT 15 12/14/2022   AST 19 12/14/2022   NA 133 (L) 12/14/2022   K 5.3 (H) 12/14/2022   CL 95 (L) 12/14/2022   CREATININE 1.24 (H) 12/14/2022   BUN 26 12/14/2022   CO2 22 12/14/2022   TSH 3.460 11/20/2019   INR 1.2 10/13/2021   HGBA1C 7.0 (H) 12/14/2022     Assessment & Plan:   Problem List Items Addressed This Visit       Cardiovascular and Mediastinum   Essential hypertension    Fair control.  Continue current medications.        Endocrine   Diabetes mellitus (HCC)    A1c at goal.  Adding Jardiance given proteinuria.      Relevant Medications   empagliflozin (JARDIANCE) 10 MG TABS tablet   Other Relevant Orders   Hemoglobin A1c (Completed)   Ambulatory referral to Nephrology     Genitourinary   CKD (chronic kidney disease) stage 3, GFR 30-59 ml/min (HCC)    Proteinuria worsening.  Referring to nephrology.  Adding Jardiance.      Relevant Orders   CMP14+EGFR (Completed)   CBC (Completed)   Microalbumin / creatinine urine ratio (Completed)   Ambulatory referral to Nephrology     Other    Hyperlipidemia   Relevant Orders   Lipid panel (Completed)    Meds  ordered this encounter  Medications   albuterol (PROVENTIL) (2.5 MG/3ML) 0.083% nebulizer solution    Sig: Take 3 mLs (2.5 mg total) by nebulization every 6 (six) hours as needed for wheezing or shortness of breath.    Dispense:  150 mL    Refill:  1   empagliflozin (JARDIANCE) 10 MG TABS tablet    Sig: Take 1 tablet (10 mg total) by mouth daily before breakfast.    Dispense:  90 tablet    Refill:  1    Follow-up:  Return in about 3 months (around 03/15/2023).  Everlene Other DO [facility]

## 2022-12-19 ENCOUNTER — Encounter: Payer: Self-pay | Admitting: *Deleted

## 2022-12-19 NOTE — Addendum Note (Signed)
Addended by: Margaretha Sheffield on: 12/19/2022 10:06 AM   Modules accepted: Orders

## 2023-01-09 ENCOUNTER — Encounter: Payer: Self-pay | Admitting: Family Medicine

## 2023-01-10 ENCOUNTER — Other Ambulatory Visit: Payer: Self-pay

## 2023-01-15 ENCOUNTER — Other Ambulatory Visit: Payer: Self-pay

## 2023-01-15 ENCOUNTER — Other Ambulatory Visit: Payer: Self-pay | Admitting: Cardiology

## 2023-01-15 ENCOUNTER — Telehealth: Payer: Self-pay | Admitting: Family Medicine

## 2023-01-15 DIAGNOSIS — N1832 Chronic kidney disease, stage 3b: Secondary | ICD-10-CM

## 2023-01-15 MED ORDER — GLUCOSE BLOOD VI STRP
ORAL_STRIP | 12 refills | Status: DC
Start: 2023-01-15 — End: 2023-01-15

## 2023-01-15 MED ORDER — ACCU-CHEK SOFTCLIX LANCETS MISC
12 refills | Status: AC
Start: 2023-01-15 — End: ?

## 2023-01-15 MED ORDER — GLUCOSE BLOOD VI STRP
ORAL_STRIP | 12 refills | Status: DC
Start: 2023-01-15 — End: 2024-02-15

## 2023-01-15 MED ORDER — ACCU-CHEK SOFTCLIX LANCETS MISC
12 refills | Status: DC
Start: 2023-01-15 — End: 2023-01-15

## 2023-01-15 NOTE — Telephone Encounter (Signed)
Nurse--Please call [facility] [address] [phone] left message on voice mail the pharmacist needs to know what testing supplies patient needs.

## 2023-01-23 ENCOUNTER — Other Ambulatory Visit: Payer: Self-pay | Admitting: Nephrology

## 2023-01-23 ENCOUNTER — Other Ambulatory Visit: Payer: Self-pay

## 2023-01-23 DIAGNOSIS — E875 Hyperkalemia: Secondary | ICD-10-CM | POA: Diagnosis not present

## 2023-01-23 DIAGNOSIS — N183 Chronic kidney disease, stage 3 unspecified: Secondary | ICD-10-CM

## 2023-01-23 DIAGNOSIS — E871 Hypo-osmolality and hyponatremia: Secondary | ICD-10-CM | POA: Diagnosis not present

## 2023-01-23 DIAGNOSIS — E1122 Type 2 diabetes mellitus with diabetic chronic kidney disease: Secondary | ICD-10-CM

## 2023-01-23 DIAGNOSIS — I129 Hypertensive chronic kidney disease with stage 1 through stage 4 chronic kidney disease, or unspecified chronic kidney disease: Secondary | ICD-10-CM | POA: Diagnosis not present

## 2023-01-23 MED ORDER — BLOOD GLUCOSE METER KIT
PACK | 1 refills | Status: DC
Start: 2023-01-23 — End: 2023-12-19

## 2023-01-30 DIAGNOSIS — E1122 Type 2 diabetes mellitus with diabetic chronic kidney disease: Secondary | ICD-10-CM | POA: Diagnosis not present

## 2023-01-30 DIAGNOSIS — N189 Chronic kidney disease, unspecified: Secondary | ICD-10-CM | POA: Diagnosis not present

## 2023-01-30 DIAGNOSIS — R809 Proteinuria, unspecified: Secondary | ICD-10-CM | POA: Diagnosis not present

## 2023-01-30 DIAGNOSIS — I129 Hypertensive chronic kidney disease with stage 1 through stage 4 chronic kidney disease, or unspecified chronic kidney disease: Secondary | ICD-10-CM | POA: Diagnosis not present

## 2023-01-30 DIAGNOSIS — E875 Hyperkalemia: Secondary | ICD-10-CM | POA: Diagnosis not present

## 2023-02-06 ENCOUNTER — Other Ambulatory Visit: Payer: Self-pay | Admitting: Family Medicine

## 2023-02-06 MED ORDER — SPIRONOLACTONE 25 MG PO TABS: ORAL_TABLET | ORAL | 0 refills | Status: DC

## 2023-02-09 ENCOUNTER — Telehealth: Payer: Self-pay

## 2023-02-09 ENCOUNTER — Ambulatory Visit (INDEPENDENT_AMBULATORY_CARE_PROVIDER_SITE_OTHER): Payer: Medicare Other

## 2023-02-09 VITALS — BP 167/77 | HR 74 | Ht 63.0 in | Wt 170.0 lb

## 2023-02-09 DIAGNOSIS — Z0000 Encounter for general adult medical examination without abnormal findings: Secondary | ICD-10-CM

## 2023-02-09 NOTE — Progress Notes (Signed)
 Subjective:   Leslie Kim is a 87 y.o. female who presents for Medicare Annual (Subsequent) preventive examination.  Visit Complete: Virtual  I connected with  Leslie Kim on 02/09/23 by a audio enabled telemedicine application and verified that I am speaking with the correct person using two identifiers.  Patient Location: Home  Provider Location: Home Office  I discussed the limitations of evaluation and management by telemedicine. The patient expressed understanding and agreed to proceed.  Patient Medicare AWV questionnaire was completed by the patient on n/a; I have confirmed that all information answered by patient is correct and no changes since this date.  Review of Systems     Cardiac Risk Factors include: advanced age (>73men, >51 women);diabetes mellitus;dyslipidemia;hypertension;sedentary lifestyle;obesity (BMI >30kg/m2)     Objective:    Today's Vitals   02/09/23 0810  BP: (!) 167/77  Pulse: 74  Weight: 170 lb (77.1 kg)  Height: 5\' 3"  (1.6 m)   Body mass index is 30.11 kg/m.     02/09/2023    8:09 AM 01/24/2022    9:13 AM 10/17/2021    1:32 PM 10/14/2021   10:21 AM 10/13/2021    7:55 PM 12/11/2019    1:13 PM 08/12/2019    7:17 PM  Advanced Directives  Does Patient Have a Medical Advance Directive? Yes Yes No No No No No  Type of Estate agent of [place];Living will Healthcare Power of [place];Living will       Does patient want to make changes to medical advance directive? No - Patient declined        Copy of Healthcare Power of Attorney in Chart? Yes - validated most recent copy scanned in chart (See row information) Yes - validated most recent copy scanned in chart (See row information)       Would patient like information on creating a medical advance directive?   No - Patient declined No - Patient declined  No - Patient declined No - Patient declined    Current Medications (verified) Outpatient Encounter Medications as of  02/09/2023  Medication Sig   Accu-Chek Softclix Lancets lancets Use to check blood sugars daily before meal dx E11.22, N18.32   acetaminophen (TYLENOL) 325 MG tablet Take 2 tablets (650 mg total) by mouth every 4 (four) hours as needed for mild pain (or temp > 37.5 C (99.5 F)).   albuterol (PROVENTIL) (2.5 MG/3ML) 0.083% nebulizer solution Take 3 mLs (2.5 mg total) by nebulization every 6 (six) hours as needed for wheezing or shortness of breath.   albuterol (VENTOLIN HFA) 108 (90 Base) MCG/ACT inhaler Inhale 2 puffs into the lungs every 6 (six) hours as needed for wheezing or shortness of breath.   apixaban (ELIQUIS) 5 MG TABS tablet TAKE (1) TABLET BY MOUTH TWICE DAILY.   b complex vitamins tablet Take 1 tablet by mouth every other day.   blood glucose meter kit and supplies Dispense based on patient and insurance preference use daily to check blood sugars  E11.9   carvedilol (COREG) 6.25 MG tablet Take 1 tablet (6.25 mg total) by mouth 2 (two) times daily with a meal.   cholecalciferol (VITAMIN D3) 25 MCG (1000 UNIT) tablet Take 2 tablets (2,000 Units total) by mouth daily.   ferrous sulfate 324 MG TBEC Take 1 tablet (324 mg total) by mouth daily with breakfast.   glucose blood test strip Use to check blood sugars daily as needed before meals dx code E11.22, N18.32   Magnesium 250  MG TABS Take 1 tablet by mouth daily.   melatonin 3 MG TABS tablet Take 2 tablets (6 mg total) by mouth at bedtime.   pantoprazole (PROTONIX) 40 MG tablet Take 1 tablet (40 mg total) by mouth daily.   sitaGLIPtin-metformin (JANUMET) 50-500 MG tablet Take 1 tablet by mouth 2 (two) times daily with a meal.   spironolactone (ALDACTONE) 25 MG tablet TAKE 1/2 TABLET(12.5 MG) BY MOUTH EVERY DAY   TRELEGY ELLIPTA 100-62.5-25 MCG/ACT AEPB INHALE 1 PUFF INTO THE LUNGS DAILY IN THE AFTERNOON   empagliflozin (JARDIANCE) 10 MG TABS tablet Take 1 tablet (10 mg total) by mouth daily before breakfast. (Patient not taking: Reported on  02/09/2023)   furosemide (LASIX) 20 MG tablet 1/2 tab daily (Patient not taking: Reported on 02/09/2023)   losartan (COZAAR) 50 MG tablet TAKE 1 TABLET(50 MG) BY MOUTH TWICE DAILY (Patient not taking: Reported on 02/09/2023)   No facility-administered encounter medications on file as of 02/09/2023.    Allergies (verified) Codeine   History: Past Medical History:  Diagnosis Date   Allergy    Cataract    COPD (chronic obstructive pulmonary disease) (HCC)    Essential hypertension    New onset atrial fibrillation (HCC) 11/2015   a. s/p DCCV on 12/18/2015 b. started on Eliquis   Nonischemic cardiomyopathy (HCC)    a. 11/2015: echo showing EF of 30-35%, cath with nonobstructive dz   Type 2 diabetes mellitus (HCC)    Past Surgical History:  Procedure Laterality Date   CARDIAC CATHETERIZATION N/A 12/23/2015   Procedure: Right/Left Heart Cath and Coronary Angiography;  Surgeon: Laurey Morale, MD;  Location: [facility] INVASIVE CV LAB;  Service: Cardiovascular;  Laterality: N/A;   cataract surgery     CHOLECYSTECTOMY     Family History  Problem Relation Age of Onset   Diabetes Mother    Diabetes Father    Cancer Father    Prostate cancer Father    Diabetes Brother    Diabetes Brother    Diabetes Brother    Diabetes Daughter    Diabetes Son    Social History   Socioeconomic History   Marital status: Married    Spouse name: Not on file   Number of children: Not on file   Years of education: Not on file   Highest education level: Not on file  Occupational History   Not on file  Tobacco Use   Smoking status: Former    Current packs/day: 0.00    Average packs/day: 1 pack/day for 40.0 years (40.0 ttl pk-yrs)    Types: Cigarettes    Start date: 12/18/1967    Quit date: 12/18/2007    Years since quitting: 15.1   Smokeless tobacco: Never   Tobacco comments:    Quit for 8 years  Vaping Use   Vaping status: Never Used  Substance and Sexual Activity   Alcohol use: No    Alcohol/week:  0.0 standard drinks of alcohol   Drug use: No   Sexual activity: Yes  Other Topics Concern   Not on file  Social History Narrative   Not on file   Social Determinants of Health   Financial Resource Strain: Low Risk  (02/09/2023)   Overall Financial Resource Strain (CARDIA)    Difficulty of Paying Living Expenses: Not hard at all  Food Insecurity: No Food Insecurity (02/09/2023)   Hunger Vital Sign    Worried About Running Out of Food in the Last Year: Never true    Ran  Out of Food in the Last Year: Never true  Transportation Needs: No Transportation Needs (02/09/2023)   PRAPARE - Administrator, Civil Service (Medical): No    Lack of Transportation (Non-Medical): No  Physical Activity: Insufficiently Active (01/24/2022)   Exercise Vital Sign    Days of Exercise per Week: 5 days    Minutes of Exercise per Session: 20 min  Stress: No Stress Concern Present (02/09/2023)   [employer] of Occupational Health - Occupational Stress Questionnaire    Feeling of Stress : Not at all  Social Connections: Socially Integrated (02/09/2023)   Social Connection and Isolation Panel [NHANES]    Frequency of Communication with Friends and Family: More than three times a week    Frequency of Social Gatherings with Friends and Family: More than three times a week    Attends Religious Services: More than 4 times per year    Active Member of [employer] or Organizations: Yes    Attends Engineer, structural: More than 4 times per year    Marital Status: Married    Tobacco Counseling Counseling given: Not Answered Tobacco comments: Quit for 8 years   Clinical Intake:  Pre-visit preparation completed: Yes  Pain : No/denies pain     BMI - recorded: 30.11 Nutritional Status: BMI > 30  Obese Nutritional Risks: None Diabetes: Yes CBG done?: No (telehealth visit. checks BG once daily in the morning. Todays BG was 140) Did pt. bring in CBG monitor from home?: No  How often do  you need to have someone help you when you read instructions, pamphlets, or other written materials from your doctor or pharmacy?: 1 - Never  Interpreter Needed?: No  Information entered by ::  Paislea Hatton, CMA   Activities of Daily Living    02/09/2023    8:21 AM  In your present state of health, do you have any difficulty performing the following activities:  Hearing? 0  Vision? 0  Difficulty concentrating or making decisions? 0  Walking or climbing stairs? 1  Comment uses walker  Dressing or bathing? 0  Doing errands, shopping? 0  Preparing Food and eating ? N  Using the Toilet? N  In the past six months, have you accidently leaked urine? N  Do you have problems with loss of bowel control? N  Managing your Medications? N  Managing your Finances? N  Housekeeping or managing your Housekeeping? N    Patient Care Team: [name], DO as PCP - General (Family Medicine) Jonelle Sidle, MD as PCP - Cardiology (Cardiology) Jena Gauss Gerrit Friends, MD as Consulting Physician (Gastroenterology)  Indicate any recent Medical Services you may have received from other than [facility] providers in the past year (date may be approximate).     Assessment:   This is a routine wellness examination for [place].  Hearing/Vision screen Hearing Screening - Comments:: Patient denies any hearing difficulties.    Dietary issues and exercise activities discussed:     Goals Addressed               This Visit's Progress     Patient Stated (pt-stated)        Patient stated her goal is to remain active and independent.        Depression Screen    02/09/2023    8:20 AM 09/14/2022    1:51 PM 06/14/2022   11:04 AM 03/14/2022   10:18 AM 03/07/2022    2:55 PM 01/24/2022    9:10  AM 12/09/2021   10:08 AM  PHQ 2/9 Scores  PHQ - 2 Score 0 2 0 0 0 0 0  PHQ- 9 Score  6  0 0      Fall Risk    02/09/2023    8:20 AM 09/14/2022    1:51 PM 06/14/2022   11:04 AM 03/14/2022   10:18 AM 03/07/2022    2:55  PM  Fall Risk   Falls in the past year? 0 0 0 0 0  Number falls in past yr: 0 0 0 0   Injury with Fall? 0 0 0 0   Risk for fall due to : No Fall Risks No Fall Risks No Fall Risks No Fall Risks   Follow up Falls prevention discussed Falls evaluation completed Falls evaluation completed Falls evaluation completed     MEDICARE RISK AT HOME:  Medicare Risk at Home - 02/09/23 0817     Any stairs in or around the home? No    If so, are there any without handrails? No    Home free of loose throw rugs in walkways, pet beds, electrical cords, etc? Yes    Adequate lighting in your home to reduce risk of falls? Yes    Life alert? No    Use of a cane, walker or w/c? Yes    Grab bars in the bathroom? Yes    Shower chair or bench in shower? Yes    Elevated toilet seat or a handicapped toilet? Yes             TIMED UP AND GO:  Was the test performed?  No    Cognitive Function:        02/09/2023    8:18 AM 01/24/2022    9:14 AM  6CIT Screen  What Year? 0 points 0 points  What month? 0 points 0 points  What time? 0 points 0 points  Count back from 20 0 points 0 points  Months in reverse 0 points 0 points  Repeat phrase 2 points 0 points  Total Score 2 points 0 points    Immunizations Immunization History  Administered Date(s) Administered   Fluad Quad(high Dose 65+) 07/08/2021   PNEUMOCOCCAL CONJUGATE-20 09/14/2022    TDAP status: Due, Education has been provided regarding the importance of this vaccine. Advised may receive this vaccine at local pharmacy or Health Dept. Aware to provide a copy of the vaccination record if obtained from local pharmacy or Health Dept. Verbalized acceptance and understanding.  Flu Vaccine status: Up to date  Pneumococcal vaccine status: Up to date  Covid-19 vaccine status: Information provided on how to obtain vaccines.   Qualifies for Shingles Vaccine? Yes   Zostavax completed Yes   Shingrix Completed?: Yes  Screening Tests Health  Maintenance  Topic Date Due   DTaP/Tdap/Td (1 - Tdap) Never done   DEXA SCAN  Never done   FOOT EXAM  12/10/2022   Medicare Annual Wellness (AWV)  01/25/2023   Zoster Vaccines- Shingrix (1 of 2) 11/29/2023 (Originally 06/27/1988)   INFLUENZA VACCINE  03/01/2023   OPHTHALMOLOGY EXAM  04/18/2023   HEMOGLOBIN A1C  06/16/2023   Diabetic kidney evaluation - eGFR measurement  12/14/2023   Diabetic kidney evaluation - Urine ACR  12/14/2023   Pneumonia Vaccine 37+ Years old  Completed   HPV VACCINES  Aged Out   COVID-19 Vaccine  Discontinued    Health Maintenance  Health Maintenance Due  Topic Date Due   DTaP/Tdap/Td (1 - Tdap)  Never done   DEXA SCAN  Never done   FOOT EXAM  12/10/2022   Medicare Annual Wellness (AWV)  01/25/2023    Colorectal cancer screening: No longer required.   Mammogram status: No longer required due to age.  Bone Density Screen: Patient Declined Screening  Lung Cancer Screening: (Low Dose CT Chest recommended if Age 29-80 years, 20 pack-year currently smoking OR have quit w/in 15years.) does not qualify.   Additional Screening:  Hepatitis C Screening: does not qualify;   Vision Screening: Recommended annual ophthalmology exams for early detection of glaucoma and other disorders of the eye. Is the patient up to date with their annual eye exam?  Yes  Who is the provider or what is the name of the office in which the patient attends annual eye exams? [facility]  Dental Screening: Recommended annual dental exams for proper oral hygiene  Diabetic Foot Exam: Diabetic Foot Exam: Overdue, Pt has been advised about the importance in completing this exam. Pt is scheduled for diabetic foot exam on February 20, 2023 with [facility]/Dr.Klein in [place].  Community Resource Referral / Chronic Care Management: CRR required this visit?  No   CCM required this visit?  No     Plan:     I have personally reviewed and noted the following in  the patient's chart:   Medical and social history Use of alcohol, tobacco or illicit drugs  Current medications and supplements including opioid prescriptions. Patient is not currently taking opioid prescriptions. Functional ability and status Nutritional status Physical activity Advanced directives List of other physicians Hospitalizations, surgeries, and ER visits in previous 12 months Vitals Screenings to include cognitive, depression, and falls Referrals and appointments  In addition, I have reviewed and discussed with patient certain preventive protocols, quality metrics, and best practice recommendations. A written personalized care plan for preventive services as well as general preventive health recommendations were provided to patient.   Because this visit was a virtual/telehealth visit,  certain criteria was not obtained, such a blood pressure, CBG if patient is a diabetic, and timed up and go.   Any medications not marked as taking were not mentioned by the patient (or their caregiver if applicable) when reconciling the medications.    Jordan Hawks Sharisse Rantz, CMA   02/09/2023   After Visit Summary: (MyChart) Due to this being a telephonic visit, the after visit summary with patients personalized plan was offered to patient via MyChart   Nurse Notes: Patient stated she discontinue her Jardiance this week due to joint pain. She states she feels better since discontinuing it. I advised patient that I would send a message to Dr.Cook regarding this. She verbalized understanding.   She has an appoint with Podiatry in [place], Dr. Georgia Lopes on 02/20/23.

## 2023-02-09 NOTE — Telephone Encounter (Cosign Needed)
 Patient completed AWVS with me today. She stated that she discontinued her Jardiance due to joint pain. She discontinued it this week. Since she discontinued the medication, she is feeling better. Her blood pressure was a little elevated this morning (bp documented in visit) and her CBG was 140 this morning as well.    Leslea Vowles, CMA  [facility] AWV Team Direct Dial: [phone]

## 2023-02-09 NOTE — Patient Instructions (Addendum)
Leslie Kim , Thank you for taking time to come for your Medicare Wellness Visit. I appreciate your ongoing commitment to your health goals. Please review the following plan we discussed and let me know if I can assist you in the future.   These are the goals we discussed:  Goals       Exercise 3x per week (30 min per time)      Continue to stay active and be able to "get around better."       Patient Stated (pt-stated)      Patient stated her goal is to remain active and independent.         This is a list of the screening recommended for you and due dates:  Health Maintenance  Topic Date Due   DTaP/Tdap/Td vaccine (1 - Tdap) Never done   DEXA scan (bone density measurement)  Never done   Complete foot exam   12/10/2022   Zoster (Shingles) Vaccine (1 of 2) 11/29/2023*   Flu Shot  03/01/2023   Eye exam for diabetics  04/18/2023   Hemoglobin A1C  06/16/2023   Yearly kidney function blood test for diabetes  12/14/2023   Yearly kidney health urinalysis for diabetes  12/14/2023   Medicare Annual Wellness Visit  02/09/2024   Pneumonia Vaccine  Completed   HPV Vaccine  Aged Out   COVID-19 Vaccine  Discontinued  *Topic was postponed. The date shown is not the original due date.    Advanced directives: We have a copy of your DNR order in your chart. These are generally updated every 10 years   Conditions/risks identified:  Aim for 30 minutes of exercise or brisk walking, 6-8 glasses of water, and 5 servings of fruits and vegetables each day.  You are due for the vaccines checked below. You may have these done at your preferred pharmacy. Please have them fax the office proof of the vaccines so that we can update your chart.   []  Flu (due annually) []  Shingrix (Shingles vaccine) []  Pneumonia Vaccines [x]  TDAP (Tetanus) Vaccine every 10 years []  Covid-19  Please keep your appointment with Podiatry, Dr. Georgia Lopes.   I hope you have a great summer!!   Next appointment:  VIRTUAL/TELEPHONE APPOINTMENT Follow up in one year for your annual wellness visit  February 15, 2024 at 8:00am telephone visit   Preventive Care 65 Years and Older, Female Preventive care refers to lifestyle choices and visits with your health care provider that can promote health and wellness. What does preventive care include? A yearly physical exam. This is also called an annual well check. Dental exams once or twice a year. Routine eye exams. Ask your health care provider how often you should have your eyes checked. Personal lifestyle choices, including: Daily care of your teeth and gums. Regular physical activity. Eating a healthy diet. Avoiding tobacco and drug use. Limiting alcohol use. Practicing safe sex. Taking low-dose aspirin every day. Taking vitamin and mineral supplements as recommended by your health care provider. What happens during an annual well check? The services and screenings done by your health care provider during your annual well check will depend on your age, overall health, lifestyle risk factors, and family history of disease. Counseling  Your health care provider may ask you questions about your: Alcohol use. Tobacco use. Drug use. Emotional well-being. Home and relationship well-being. Sexual activity. Eating habits. History of falls. Memory and ability to understand (cognition). Work and work Astronomer. Reproductive health. Screening  You  may have the following tests or measurements: Height, weight, and BMI. Blood pressure. Lipid and cholesterol levels. These may be checked every 5 years, or more frequently if you are over 71 years old. Skin check. Lung cancer screening. You may have this screening every year starting at age 16 if you have a 30-pack-year history of smoking and currently smoke or have quit within the past 15 years. Fecal occult blood test (FOBT) of the stool. You may have this test every year starting at age 4. Flexible  sigmoidoscopy or colonoscopy. You may have a sigmoidoscopy every 5 years or a colonoscopy every 10 years starting at age 80. Hepatitis C blood test. Hepatitis B blood test. Sexually transmitted disease (STD) testing. Diabetes screening. This is done by checking your blood sugar (glucose) after you have not eaten for a while (fasting). You may have this done every 1-3 years. Bone density scan. This is done to screen for osteoporosis. You may have this done starting at age 46. Mammogram. This may be done every 1-2 years. Talk to your health care provider about how often you should have regular mammograms. Talk with your health care provider about your test results, treatment options, and if necessary, the need for more tests. Vaccines  Your health care provider may recommend certain vaccines, such as: Influenza vaccine. This is recommended every year. Tetanus, diphtheria, and acellular pertussis (Tdap, Td) vaccine. You may need a Td booster every 10 years. Zoster vaccine. You may need this after age 24. Pneumococcal 13-valent conjugate (PCV13) vaccine. One dose is recommended after age 1. Pneumococcal polysaccharide (PPSV23) vaccine. One dose is recommended after age 90. Talk to your health care provider about which screenings and vaccines you need and how often you need them. This information is not intended to replace advice given to you by your health care provider. Make sure you discuss any questions you have with your health care provider. Document Released: 08/13/2015 Document Revised: 04/05/2016 Document Reviewed: 05/18/2015 Elsevier Interactive Patient Education  2017 [employer].  Fall Prevention in the Home Falls can cause injuries. They can happen to people of all ages. There are many things you can do to make your home safe and to help prevent falls. What can I do on the outside of my home? Regularly fix the edges of walkways and driveways and fix any cracks. Remove anything that  might make you trip as you walk through a door, such as a raised step or threshold. Trim any bushes or trees on the path to your home. Use bright outdoor lighting. Clear any walking paths of anything that might make someone trip, such as rocks or tools. Regularly check to see if handrails are loose or broken. Make sure that both sides of any steps have handrails. Any raised decks and porches should have guardrails on the edges. Have any leaves, snow, or ice cleared regularly. Use sand or salt on walking paths during winter. Clean up any spills in your garage right away. This includes oil or grease spills. What can I do in the bathroom? Use night lights. Install grab bars by the toilet and in the tub and shower. Do not use towel bars as grab bars. Use non-skid mats or decals in the tub or shower. If you need to sit down in the shower, use a plastic, non-slip stool. Keep the floor dry. Clean up any water that spills on the floor as soon as it happens. Remove soap buildup in the tub or shower regularly. Attach  bath mats securely with double-sided non-slip rug tape. Do not have throw rugs and other things on the floor that can make you trip. What can I do in the bedroom? Use night lights. Make sure that you have a light by your bed that is easy to reach. Do not use any sheets or blankets that are too big for your bed. They should not hang down onto the floor. Have a firm chair that has side arms. You can use this for support while you get dressed. Do not have throw rugs and other things on the floor that can make you trip. What can I do in the kitchen? Clean up any spills right away. Avoid walking on wet floors. Keep items that you use a lot in easy-to-reach places. If you need to reach something above you, use a strong step stool that has a grab bar. Keep electrical cords out of the way. Do not use floor polish or wax that makes floors slippery. If you must use wax, use non-skid floor  wax. Do not have throw rugs and other things on the floor that can make you trip. What can I do with my stairs? Do not leave any items on the stairs. Make sure that there are handrails on both sides of the stairs and use them. Fix handrails that are broken or loose. Make sure that handrails are as long as the stairways. Check any carpeting to make sure that it is firmly attached to the stairs. Fix any carpet that is loose or worn. Avoid having throw rugs at the top or bottom of the stairs. If you do have throw rugs, attach them to the floor with carpet tape. Make sure that you have a light switch at the top of the stairs and the bottom of the stairs. If you do not have them, ask someone to add them for you. What else can I do to help prevent falls? Wear shoes that: Do not have high heels. Have rubber bottoms. Are comfortable and fit you well. Are closed at the toe. Do not wear sandals. If you use a stepladder: Make sure that it is fully opened. Do not climb a closed stepladder. Make sure that both sides of the stepladder are locked into place. Ask someone to hold it for you, if possible. Clearly mark and make sure that you can see: Any grab bars or handrails. First and last steps. Where the edge of each step is. Use tools that help you move around (mobility aids) if they are needed. These include: Canes. Walkers. Scooters. Crutches. Turn on the lights when you go into a dark area. Replace any light bulbs as soon as they burn out. Set up your furniture so you have a clear path. Avoid moving your furniture around. If any of your floors are uneven, fix them. If there are any pets around you, be aware of where they are. Review your medicines with your doctor. Some medicines can make you feel dizzy. This can increase your chance of falling. Ask your doctor what other things that you can do to help prevent falls. This information is not intended to replace advice given to you by your  health care provider. Make sure you discuss any questions you have with your health care provider. Document Released: 05/13/2009 Document Revised: 12/23/2015 Document Reviewed: 08/21/2014 Elsevier Interactive Patient Education  2017 [employer].

## 2023-02-19 DIAGNOSIS — R803 Bence Jones proteinuria: Secondary | ICD-10-CM | POA: Diagnosis not present

## 2023-02-19 DIAGNOSIS — N2581 Secondary hyperparathyroidism of renal origin: Secondary | ICD-10-CM | POA: Diagnosis not present

## 2023-02-19 DIAGNOSIS — E611 Iron deficiency: Secondary | ICD-10-CM | POA: Diagnosis not present

## 2023-02-19 DIAGNOSIS — D472 Monoclonal gammopathy: Secondary | ICD-10-CM | POA: Diagnosis not present

## 2023-02-20 ENCOUNTER — Ambulatory Visit (HOSPITAL_COMMUNITY)
Admission: RE | Admit: 2023-02-20 | Discharge: 2023-02-20 | Disposition: A | Discharge status: Home / Self Care | Payer: Medicare Other | Source: Ambulatory Visit | Attending: Nephrology | Admitting: Nephrology

## 2023-02-20 DIAGNOSIS — M79672 Pain in left foot: Secondary | ICD-10-CM | POA: Diagnosis not present

## 2023-02-20 DIAGNOSIS — M79674 Pain in right toe(s): Secondary | ICD-10-CM | POA: Diagnosis not present

## 2023-02-20 DIAGNOSIS — M79675 Pain in left toe(s): Secondary | ICD-10-CM | POA: Diagnosis not present

## 2023-02-20 DIAGNOSIS — L110 Acquired keratosis follicularis: Secondary | ICD-10-CM | POA: Diagnosis not present

## 2023-02-20 DIAGNOSIS — M79671 Pain in right foot: Secondary | ICD-10-CM | POA: Diagnosis not present

## 2023-02-20 DIAGNOSIS — R39198 Other difficulties with micturition: Secondary | ICD-10-CM | POA: Diagnosis not present

## 2023-02-20 DIAGNOSIS — N183 Chronic kidney disease, stage 3 unspecified: Secondary | ICD-10-CM | POA: Insufficient documentation

## 2023-02-20 DIAGNOSIS — I739 Peripheral vascular disease, unspecified: Secondary | ICD-10-CM | POA: Diagnosis not present

## 2023-02-20 DIAGNOSIS — E1140 Type 2 diabetes mellitus with diabetic neuropathy, unspecified: Secondary | ICD-10-CM | POA: Diagnosis not present

## 2023-03-07 ENCOUNTER — Ambulatory Visit: Payer: Medicare Other | Attending: Cardiology | Admitting: Cardiology

## 2023-03-07 ENCOUNTER — Encounter: Payer: Self-pay | Admitting: Cardiology

## 2023-03-07 VITALS — BP 121/70 | HR 72 | Ht 63.0 in | Wt 172.2 lb

## 2023-03-07 DIAGNOSIS — E782 Mixed hyperlipidemia: Secondary | ICD-10-CM

## 2023-03-07 DIAGNOSIS — Z8679 Personal history of other diseases of the circulatory system: Secondary | ICD-10-CM | POA: Diagnosis not present

## 2023-03-07 DIAGNOSIS — I480 Paroxysmal atrial fibrillation: Secondary | ICD-10-CM | POA: Diagnosis not present

## 2023-03-07 NOTE — Progress Notes (Signed)
Cardiology Office Note  Date: 03/07/2023   ID: Leslie Kim, DOB 04-26-1936, MRN [id number]  History of Present Illness: Leslie Kim is an 87 y.o. female last seen in December 2023.  She is here for a follow-up visit.  Weight looks to be relatively stable over the last 3 months.  She does not report any leg swelling.  No increasing dyspnea with typical activities, generally NYHA class II.  She does not report any palpitations.  I reviewed her medications.  She was started on Jardiance by Dr. Adriana Simas back in May, but she stopped it after about a month complaining of left hip and leg pain which apparently resolved.  Somewhat of an unusual side effect for this however.  She does not report any spontaneous bleeding problems on Eliquis.  ECG today shows sinus rhythm with PACs, IVCD with poor R wave progression and nonspecific ST changes.  Physical Exam: VS:  BP 121/70   Pulse 72   Ht 5\' 3"  (1.6 m)   Wt 172 lb 3.2 oz (78.1 kg)   SpO2 95%   BMI 30.50 kg/m , BMI Body mass index is 30.5 kg/m.  Wt Readings from Last 3 Encounters:  03/07/23 172 lb 3.2 oz (78.1 kg)  02/09/23 170 lb (77.1 kg)  12/13/22 173 lb (78.5 kg)    General: Patient appears comfortable at rest. HEENT: Conjunctiva and lids normal. Neck: Supple, no elevated JVP or carotid bruits. Lungs: Clear to auscultation, nonlabored breathing at rest. Cardiac: Regular rate and rhythm, no S3, 1/6 systolic murmur. Extremities: No pitting edema.  ECG:  An ECG dated 10/13/2021 was personally reviewed today and demonstrated:  Sinus rhythm with left bundle branch block.  Labwork: 12/14/2022: ALT 15; AST 19; BUN 26; Creatinine, Ser 1.24; Hemoglobin 11.8; Platelets 244; Potassium 5.3; Sodium 133     Component Value Date/Time   CHOL 241 (H) 12/14/2022 1226   TRIG 245 (H) 12/14/2022 1226   HDL 55 12/14/2022 1226   CHOLHDL 4.4 12/14/2022 1226   CHOLHDL 4.5 10/14/2021 0342   VLDL 28 10/14/2021 0342   LDLCALC 142 (H) 12/14/2022 1226    Other Studies Reviewed Today:  Echocardiogram 10/14/2021:  1. Left ventricular ejection fraction, by estimation, is 55 to 60%. The  left ventricle has normal function. Left ventricular endocardial border  not optimally defined to evaluate regional wall motion. There is mild left  ventricular hypertrophy. Left  ventricular diastolic parameters are consistent with Grade I diastolic  dysfunction (impaired relaxation). Elevated left atrial pressure.   2. Right ventricular systolic function is normal. The right ventricular  size is normal. There is normal pulmonary artery systolic pressure.   3. The mitral valve is abnormal. Mild mitral valve regurgitation. Mild  mitral stenosis.   4. The tricuspid valve is abnormal.   5. The aortic valve was not well visualized. There is mild calcification  of the aortic valve. There is mild thickening of the aortic valve. Aortic  valve regurgitation is not visualized. No aortic stenosis is present.   6. The inferior vena cava is normal in size with greater than 50%  respiratory variability, suggesting right atrial pressure of 3 mmHg.   Assessment and Plan:  1.  History of nonischemic cardiomyopathy with normalization of LVEF, 55 to 60% by echocardiogram in March of last year.  Weight is stable and she reports no peripheral edema.  Current regimen includes Coreg, Cozaar, Aldactone, and low-dose Lasix.  She did not tolerate Jardiance as discussed above.  2.  Paroxysmal atrial fibrillation with CHA2DS2-VASc score of 7.  No palpitations, heart rate regular today.  She continues on Eliquis for stroke prophylaxis.  I reviewed her most recent lab work.  3.  Mixed hyperlipidemia with statin myalgias including both Lipitor and Crestor.  She has not wanted to consider other treatment options.  LDL 142 in May.  Disposition:  Follow up  6 months.  Signed, Jonelle Sidle, M.D., F.A.C.C. [facility]

## 2023-03-07 NOTE — Patient Instructions (Signed)
Medication Instructions:  Your physician recommends that you continue on your current medications as directed. Please refer to the Current Medication list given to you today.   Labwork: None today  Testing/Procedures: None today  Follow-Up: 6 months  Any Other Special Instructions Will Be Listed Below (If Applicable).  If you need a refill on your cardiac medications before your next appointment, please call your pharmacy.  

## 2023-03-14 ENCOUNTER — Ambulatory Visit: Payer: Medicare Other | Admitting: Family Medicine

## 2023-03-15 ENCOUNTER — Ambulatory Visit: Payer: Medicare Other | Admitting: Family Medicine

## 2023-03-15 VITALS — BP 142/66 | HR 73 | Wt 171.2 lb

## 2023-03-15 DIAGNOSIS — N1832 Chronic kidney disease, stage 3b: Secondary | ICD-10-CM | POA: Diagnosis not present

## 2023-03-15 DIAGNOSIS — I10 Essential (primary) hypertension: Secondary | ICD-10-CM

## 2023-03-15 DIAGNOSIS — Z7984 Long term (current) use of oral hypoglycemic drugs: Secondary | ICD-10-CM | POA: Diagnosis not present

## 2023-03-15 DIAGNOSIS — E1122 Type 2 diabetes mellitus with diabetic chronic kidney disease: Secondary | ICD-10-CM | POA: Diagnosis not present

## 2023-03-15 DIAGNOSIS — J449 Chronic obstructive pulmonary disease, unspecified: Secondary | ICD-10-CM | POA: Diagnosis not present

## 2023-03-15 DIAGNOSIS — Z780 Asymptomatic menopausal state: Secondary | ICD-10-CM

## 2023-03-15 MED ORDER — TRELEGY ELLIPTA 100-62.5-25 MCG/ACT IN AEPB: INHALATION_SPRAY | RESPIRATORY_TRACT | 3 refills | Status: DC

## 2023-03-15 MED ORDER — JANUMET 50-500 MG PO TABS: 1.0000 | ORAL_TABLET | Freq: Two times a day (BID) | ORAL | 1 refills | Status: DC

## 2023-03-15 MED ORDER — PANTOPRAZOLE SODIUM 40 MG PO TBEC: 40.0000 mg | DELAYED_RELEASE_TABLET | Freq: Every day | ORAL | 3 refills | Status: DC

## 2023-03-15 NOTE — Patient Instructions (Signed)
 Continue your medications.  Follow up in 3-6 months.

## 2023-03-16 NOTE — Progress Notes (Signed)
Subjective:  Patient ID: Leslie Kim, female    DOB: 1936-03-22  Age: 87 y.o. MRN: [id number]  CC:  Follow up   HPI:  87 year old female with the below mentioned medical problems presents for follow-up.  Patient states that overall she is doing all right.  No chest pain or shortness of breath.  Patient has stopped Jardiance.  She states that it gave her joint pain which caused her to have difficulty walking.  Last A1c 7.  Not yet due for A1c.  She has significant proteinuria.  She is on Janumet.  BP fairly well-controlled given advanced age.  She is on losartan, carvedilol, Lasix.   COPD stable on albuterol and Trelegy.  Patient Active Problem List   Diagnosis Date Noted   Hepatic steatosis 09/15/2022   History of stroke 12/09/2021   Hyperlipidemia 12/09/2021   Essential hypertension    CKD (chronic kidney disease) stage 3, GFR 30-59 ml/min (HCC) 12/12/2019   Chronic obstructive pulmonary disease (HCC) 10/21/2019   Gastroesophageal reflux disease without esophagitis 10/21/2019   Atrial fibrillation (HCC) 01/07/2016   Nonischemic cardiomyopathy (HCC) 12/24/2015   Diabetes mellitus (HCC) 12/18/2015    Social Hx   Social History   Socioeconomic History   Marital status: Married    Spouse name: Not on file   Number of children: Not on file   Years of education: Not on file   Highest education level: Not on file  Occupational History   Not on file  Tobacco Use   Smoking status: Former    Current packs/day: 0.00    Average packs/day: 1 pack/day for 40.0 years (40.0 ttl pk-yrs)    Types: Cigarettes    Start date: 12/18/1967    Quit date: 12/18/2007    Years since quitting: 15.2   Smokeless tobacco: Never   Tobacco comments:    Quit for 8 years  Vaping Use   Vaping status: Never Used  Substance and Sexual Activity   Alcohol use: No    Alcohol/week: 0.0 standard drinks of alcohol   Drug use: No   Sexual activity: Yes  Other Topics Concern   Not on file  Social  History Narrative   Not on file   Social Determinants of Health   Financial Resource Strain: Low Risk  (02/09/2023)   Overall Financial Resource Strain (CARDIA)    Difficulty of Paying Living Expenses: Not hard at all  Food Insecurity: No Food Insecurity (02/09/2023)   Hunger Vital Sign    Worried About Running Out of Food in the Last Year: Never true    Ran Out of Food in the Last Year: Never true  Transportation Needs: No Transportation Needs (02/09/2023)   PRAPARE - Administrator, Civil Service (Medical): No    Lack of Transportation (Non-Medical): No  Physical Activity: Insufficiently Active (01/24/2022)   Exercise Vital Sign    Days of Exercise per Week: 5 days    Minutes of Exercise per Session: 20 min  Stress: No Stress Concern Present (02/09/2023)   [employer] of Occupational Health - Occupational Stress Questionnaire    Feeling of Stress : Not at all  Social Connections: Socially Integrated (02/09/2023)   Social Connection and Isolation Panel [NHANES]    Frequency of Communication with Friends and Family: More than three times a week    Frequency of Social Gatherings with Friends and Family: More than three times a week    Attends Religious Services: More than 4 times per year  Active Member of Clubs or Organizations: Yes    Attends Banker Meetings: More than 4 times per year    Marital Status: Married    Review of Systems  Constitutional: Negative.   Cardiovascular:  Negative for chest pain.   Objective:  BP (!) 142/66   Pulse 73   Wt 171 lb 3.2 oz (77.7 kg)   SpO2 96%   BMI 30.33 kg/m      03/15/2023    2:38 PM 03/07/2023    3:25 PM 02/09/2023    8:10 AM  BP/Weight  Systolic BP 142 121 167  Diastolic BP 66 70 77  Wt. (Lbs) 171.2 172.2 170  BMI 30.33 kg/m2 30.5 kg/m2 30.11 kg/m2    Physical Exam Constitutional:      General: She is not in acute distress.    Appearance: Normal appearance.  HENT:     Head: Normocephalic  and atraumatic.  Eyes:     General:        Right eye: No discharge.        Left eye: No discharge.     Conjunctiva/sclera: Conjunctivae normal.  Cardiovascular:     Rate and Rhythm: Normal rate and regular rhythm.  Pulmonary:     Effort: Pulmonary effort is normal.     Breath sounds: Normal breath sounds. No wheezing, rhonchi or rales.  Neurological:     Mental Status: She is alert.  Psychiatric:        Mood and Affect: Mood normal.        Behavior: Behavior normal.     Lab Results  Component Value Date   WBC 8.3 12/14/2022   HGB 11.8 12/14/2022   HCT 37.3 12/14/2022   PLT 244 12/14/2022   GLUCOSE 138 (H) 12/14/2022   CHOL 241 (H) 12/14/2022   TRIG 245 (H) 12/14/2022   HDL 55 12/14/2022   LDLCALC 142 (H) 12/14/2022   ALT 15 12/14/2022   AST 19 12/14/2022   NA 133 (L) 12/14/2022   K 5.3 (H) 12/14/2022   CL 95 (L) 12/14/2022   CREATININE 1.24 (H) 12/14/2022   BUN 26 12/14/2022   CO2 22 12/14/2022   TSH 3.460 11/20/2019   INR 1.2 10/13/2021   HGBA1C 7.0 (H) 12/14/2022     Assessment & Plan:   Problem List Items Addressed This Visit       Cardiovascular and Mediastinum   Essential hypertension - Primary    Fair control.  Continue current medications.        Respiratory   Chronic obstructive pulmonary disease (HCC)    Stable.  Continue Trelegy.      Relevant Medications   Fluticasone-Umeclidin-Vilant (TRELEGY ELLIPTA) 100-62.5-25 MCG/ACT AEPB     Endocrine   Diabetes mellitus (HCC)    Continue current medications.      Relevant Medications   sitaGLIPtin-metformin (JANUMET) 50-500 MG tablet   Other Visit Diagnoses     Post-menopausal       Relevant Orders   DG Bone Density       Meds ordered this encounter  Medications   Fluticasone-Umeclidin-Vilant (TRELEGY ELLIPTA) 100-62.5-25 MCG/ACT AEPB    Sig: INHALE 1 PUFF INTO THE LUNGS DAILY IN THE AFTERNOON    Dispense:  90 each    Refill:  3   pantoprazole (PROTONIX) 40 MG tablet    Sig: Take  1 tablet (40 mg total) by mouth daily.    Dispense:  90 tablet    Refill:  3  sitaGLIPtin-metformin (JANUMET) 50-500 MG tablet    Sig: Take 1 tablet by mouth 2 (two) times daily with a meal.    Dispense:  180 tablet    Refill:  1    Follow-up:  Return in about 6 months (around 09/15/2023).  Everlene Other DO [facility]

## 2023-03-16 NOTE — Assessment & Plan Note (Signed)
Stable. Continue Trelegy.

## 2023-03-16 NOTE — Assessment & Plan Note (Signed)
Continue current medications. 

## 2023-03-16 NOTE — Assessment & Plan Note (Signed)
Fair control. Continue current medications. 

## 2023-04-05 ENCOUNTER — Telehealth: Payer: Self-pay | Admitting: Cardiology

## 2023-04-05 LAB — HM DIABETES EYE EXAM

## 2023-04-05 NOTE — Telephone Encounter (Signed)
*  STAT* If patient is at the pharmacy, call can be transferred to refill team.   1. Which medications need to be refilled? (please list name of each medication and dose if known)   losartan (COZAAR) 50 MG tablet      4. Which pharmacy/location (including street and city if local pharmacy) is medication to be sent to?[id number] - [place], [place] - [address]    5. Do they need a 30 day or 90 day supply? 90

## 2023-04-06 MED ORDER — LOSARTAN POTASSIUM 50 MG PO TABS: 75.0000 mg | ORAL_TABLET | Freq: Every day | ORAL | 3 refills | Status: DC

## 2023-04-06 NOTE — Telephone Encounter (Signed)
Refill complete 

## 2023-04-15 ENCOUNTER — Other Ambulatory Visit: Payer: Self-pay | Admitting: Family Medicine

## 2023-04-30 ENCOUNTER — Encounter: Payer: Self-pay | Admitting: *Deleted

## 2023-05-01 DIAGNOSIS — M79675 Pain in left toe(s): Secondary | ICD-10-CM | POA: Diagnosis not present

## 2023-05-01 DIAGNOSIS — I739 Peripheral vascular disease, unspecified: Secondary | ICD-10-CM | POA: Diagnosis not present

## 2023-05-01 DIAGNOSIS — M79674 Pain in right toe(s): Secondary | ICD-10-CM | POA: Diagnosis not present

## 2023-05-01 DIAGNOSIS — M79672 Pain in left foot: Secondary | ICD-10-CM | POA: Diagnosis not present

## 2023-05-01 DIAGNOSIS — E1140 Type 2 diabetes mellitus with diabetic neuropathy, unspecified: Secondary | ICD-10-CM | POA: Diagnosis not present

## 2023-05-01 DIAGNOSIS — M79671 Pain in right foot: Secondary | ICD-10-CM | POA: Diagnosis not present

## 2023-05-01 DIAGNOSIS — L110 Acquired keratosis follicularis: Secondary | ICD-10-CM | POA: Diagnosis not present

## 2023-05-09 DIAGNOSIS — E1122 Type 2 diabetes mellitus with diabetic chronic kidney disease: Secondary | ICD-10-CM | POA: Diagnosis not present

## 2023-05-09 DIAGNOSIS — N189 Chronic kidney disease, unspecified: Secondary | ICD-10-CM | POA: Diagnosis not present

## 2023-05-09 DIAGNOSIS — D472 Monoclonal gammopathy: Secondary | ICD-10-CM | POA: Diagnosis not present

## 2023-05-09 DIAGNOSIS — N2581 Secondary hyperparathyroidism of renal origin: Secondary | ICD-10-CM | POA: Diagnosis not present

## 2023-05-09 DIAGNOSIS — R809 Proteinuria, unspecified: Secondary | ICD-10-CM | POA: Diagnosis not present

## 2023-05-11 ENCOUNTER — Other Ambulatory Visit: Payer: Self-pay | Admitting: Family Medicine

## 2023-05-15 DIAGNOSIS — I129 Hypertensive chronic kidney disease with stage 1 through stage 4 chronic kidney disease, or unspecified chronic kidney disease: Secondary | ICD-10-CM | POA: Diagnosis not present

## 2023-05-15 DIAGNOSIS — R809 Proteinuria, unspecified: Secondary | ICD-10-CM | POA: Diagnosis not present

## 2023-05-15 DIAGNOSIS — E1122 Type 2 diabetes mellitus with diabetic chronic kidney disease: Secondary | ICD-10-CM | POA: Diagnosis not present

## 2023-05-15 DIAGNOSIS — R3914 Feeling of incomplete bladder emptying: Secondary | ICD-10-CM | POA: Diagnosis not present

## 2023-07-02 ENCOUNTER — Inpatient Hospital Stay: Payer: Medicare Other | Attending: Oncology | Admitting: Oncology

## 2023-07-02 ENCOUNTER — Inpatient Hospital Stay: Payer: Medicare Other

## 2023-07-02 VITALS — BP 163/57 | HR 78 | Temp 97.5°F | Resp 18 | Ht 63.0 in | Wt 173.5 lb

## 2023-07-02 DIAGNOSIS — N1832 Chronic kidney disease, stage 3b: Secondary | ICD-10-CM

## 2023-07-02 DIAGNOSIS — D472 Monoclonal gammopathy: Secondary | ICD-10-CM | POA: Insufficient documentation

## 2023-07-02 DIAGNOSIS — Z79899 Other long term (current) drug therapy: Secondary | ICD-10-CM | POA: Insufficient documentation

## 2023-07-02 DIAGNOSIS — D509 Iron deficiency anemia, unspecified: Secondary | ICD-10-CM | POA: Diagnosis not present

## 2023-07-02 DIAGNOSIS — N189 Chronic kidney disease, unspecified: Secondary | ICD-10-CM | POA: Diagnosis not present

## 2023-07-02 DIAGNOSIS — E611 Iron deficiency: Secondary | ICD-10-CM | POA: Insufficient documentation

## 2023-07-02 DIAGNOSIS — Z7901 Long term (current) use of anticoagulants: Secondary | ICD-10-CM | POA: Insufficient documentation

## 2023-07-02 NOTE — Assessment & Plan Note (Signed)
Patient has iron deficiency with slight anemia at this time.  She has chronic kidney disease with low iron saturation ratios.  Currently taking oral iron supplements. -Start IV iron therapy.We discussed some of the risks, benefits, and alternatives of intravenous iron infusions. The patient is symptomatic from anemia and the iron level is critically low. She tolerated oral iron supplement poorly and desires to achieved higher levels of iron faster for adequate hematopoesis. Some of the side-effects to be expected including risks of infusion reactions, phlebitis, headaches, nausea and fatigue.  The patient is willing to proceed. Patient education material was dispensed. Goal is to keep ferritin level greater than 50 and resolution of anemia and TSAT 25-30 -Oral iron supplementation every other day -Repeat labs in 6 weeks

## 2023-07-02 NOTE — Assessment & Plan Note (Signed)
Patient has history of chronic kidney disease and follows with Dr. Wolfgang Phoenix -Continue to follow with Dr. Wolfgang Phoenix

## 2023-07-02 NOTE — Progress Notes (Signed)
[facility] HEMATOLOGY NEW VISIT  Leslie Kim, [place]  REASON FOR REFERRAL: M spike   HISTORY OF PRESENT ILLNESS: Leslie Kim 87 y.o. female referred for presence of M spike.  She is accompanied by her daughter today.  She has a past medical history of chronic kidney disease.  Patient reports some fatigue.  She has a history of iron deficiency and has been taking oral iron every day.  She has no other complaints and has been doing very well.  She reports living with her son and has been functional including cooking every day.  She denies any bone pain, abdominal pain, recent fractures.  She is a non-smoker, no alcohol use.  Quit smoking 15 years ago.   I have reviewed the past medical history, past surgical history, social history and family history with the patient   ALLERGIES:  is allergic to codeine.  MEDICATIONS:  Current Outpatient Medications  Medication Sig Dispense Refill   Accu-Chek Softclix Lancets lancets Use to check blood sugars daily before meal dx E11.22, N18.32 100 each 12   acetaminophen (TYLENOL) 325 MG tablet Take 2 tablets (650 mg total) by mouth every 4 (four) hours as needed for mild pain (or temp > 37.5 C (99.5 F)).     albuterol (PROVENTIL) (2.5 MG/3ML) 0.083% nebulizer solution Take 3 mLs (2.5 mg total) by nebulization every 6 (six) hours as needed for wheezing or shortness of breath. 150 mL 1   albuterol (VENTOLIN HFA) 108 (90 Base) MCG/ACT inhaler Inhale 2 puffs into the lungs every 6 (six) hours as needed for wheezing or shortness of breath. 18 g 3   apixaban (ELIQUIS) 5 MG TABS tablet TAKE (1) TABLET BY MOUTH TWICE DAILY. 60 tablet 11   b complex vitamins tablet Take 1 tablet by mouth every other day.     blood glucose meter kit and supplies Dispense based on patient and insurance preference use daily to check blood sugars  E11.9 1 each 1   carvedilol (COREG) 6.25 MG tablet TAKE 1 TABLET(6.25 MG) BY MOUTH TWICE DAILY  WITH A MEAL 180 tablet 3   cholecalciferol (VITAMIN D3) 25 MCG (1000 UNIT) tablet Take 2 tablets (2,000 Units total) by mouth daily. 30 tablet 0   ferrous sulfate 324 MG TBEC Take 1 tablet (324 mg total) by mouth daily with breakfast. 30 tablet 1   Fluticasone-Umeclidin-Vilant (TRELEGY ELLIPTA) 100-62.5-25 MCG/ACT AEPB INHALE 1 PUFF INTO THE LUNGS DAILY IN THE AFTERNOON 90 each 3   furosemide (LASIX) 20 MG tablet 1/2 tab daily 30 tablet 11   glucose blood test strip Use to check blood sugars daily as needed before meals dx code E11.22, N18.32 100 each 12   losartan (COZAAR) 50 MG tablet Take 1.5 tablets (75 mg total) by mouth daily. Patient taking 1 1/2 tablet by mouth (75 mg Total) daily 145 tablet 3   Magnesium 250 MG TABS Take 1 tablet by mouth daily.     melatonin 3 MG TABS tablet Take 2 tablets (6 mg total) by mouth at bedtime. 60 tablet 1   pantoprazole (PROTONIX) 40 MG tablet Take 1 tablet (40 mg total) by mouth daily. 90 tablet 3   sitaGLIPtin-metformin (JANUMET) 50-500 MG tablet Take 1 tablet by mouth 2 (two) times daily with a meal. 180 tablet 1   spironolactone (ALDACTONE) 25 MG tablet TAKE 1/2 TABLET(12.5 MG) BY MOUTH EVERY DAY 45 tablet 0   No current facility-administered medications for this visit.  REVIEW OF SYSTEMS:   Constitutional: Denies fevers, chills or night sweats Eyes: Denies blurriness of vision Ears, nose, mouth, throat, and face: Denies mucositis or sore throat Respiratory: Denies cough, dyspnea or wheezes Cardiovascular: Denies palpitation, chest discomfort or lower extremity swelling Gastrointestinal:  Denies nausea, heartburn or change in bowel habits Skin: Denies abnormal skin rashes Lymphatics: Denies new lymphadenopathy or easy bruising Neurological:Denies numbness, tingling or new weaknesses Behavioral/Psych: Mood is stable, no new changes  All other systems were reviewed with the patient and are negative.  PHYSICAL EXAMINATION:   Vitals:    07/02/23 1405  BP: (!) 163/57  Pulse: 78  Resp: 18  Temp: (!) 97.5 F (36.4 C)  SpO2: 96%    GENERAL:alert, no distress and comfortable LUNGS: clear to auscultation and percussion with normal breathing effort HEART: regular rate & rhythm and no murmurs and no lower extremity edema ABDOMEN:abdomen soft, non-tender and normal bowel sounds Musculoskeletal:no cyanosis of digits and no clubbing  NEURO: alert & oriented x 3 with fluent speech  LABORATORY DATA:  I have reviewed the data as listed labs from her nephrologist from 05/08/2023 CBC: WBC: 8.9, hemoglobin: 11.4, hematocrit: 36.6, MCV: 98, platelets: 200 CMP: Creatinine: 1.27 GFR: 42, calcium: 10.2 IgG: 1562, IgA: 374, IgM: 51 SPEP: M spike: 0.4, IFE: IgG monoclonal protein with lambda light chain specificity Free kappa light chains: 66.0, free lambda light chain: 59.1, FLC ratio: 1.12  Labs from 02/07/2023 also showed an M spike of 0.4 Labs from 02/02/2023 showed no M spike Labs from 01/31/2023: Iron: 45, iron saturation: 14%, TIBC: 329, ferritin: 38 Vitamin B12: 792, folate: Greater than 20  Lab Results  Component Value Date   WBC 8.3 12/14/2022   NEUTROABS 6.4 10/18/2021   HGB 11.8 12/14/2022   HCT 37.3 12/14/2022   MCV 87 12/14/2022   PLT 244 12/14/2022      Component Value Date/Time   NA 133 (L) 12/14/2022 1226   K 5.3 (H) 12/14/2022 1226   CL 95 (L) 12/14/2022 1226   CO2 22 12/14/2022 1226   GLUCOSE 138 (H) 12/14/2022 1226   GLUCOSE 179 (H) 10/25/2021 0555   BUN 26 12/14/2022 1226   CREATININE 1.24 (H) 12/14/2022 1226   CREATININE 1.08 (H) 03/28/2016 1252   CALCIUM 9.8 12/14/2022 1226   PROT 7.7 12/14/2022 1226   ALBUMIN 4.2 12/14/2022 1226   AST 19 12/14/2022 1226   ALT 15 12/14/2022 1226   ALKPHOS 69 12/14/2022 1226   BILITOT 0.2 12/14/2022 1226   GFRNONAA 42 (L) 10/25/2021 0555   GFRAA 53 (L) 11/20/2019 1459       Chemistry      Component Value Date/Time   NA 133 (L) 12/14/2022 1226   K 5.3 (H)  12/14/2022 1226   CL 95 (L) 12/14/2022 1226   CO2 22 12/14/2022 1226   BUN 26 12/14/2022 1226   CREATININE 1.24 (H) 12/14/2022 1226   CREATININE 1.08 (H) 03/28/2016 1252      Component Value Date/Time   CALCIUM 9.8 12/14/2022 1226   ALKPHOS 69 12/14/2022 1226   AST 19 12/14/2022 1226   ALT 15 12/14/2022 1226   BILITOT 0.2 12/14/2022 1226       ASSESSMENT & PLAN:  Patient is an 87 year old female with a chronic kidney disease referred for presence of an M spike  MGUS (monoclonal gammopathy of unknown significance) Patient has an M spike of 0.4 on recent blood work.  IFE consistent with IgG lambda chain.  Slight elevation of free  light chains with normal FLC ratio.  No other CRAB criteria.  Patient likely has MGUS. -No indication for bone marrow biopsy or PET scan at this time with low risk MGUS -Repeat labs in 3 months  Iron deficiency Patient has iron deficiency with slight anemia at this time.  She has chronic kidney disease with low iron saturation ratios.  Currently taking oral iron supplements. -Start IV iron therapy.We discussed some of the risks, benefits, and alternatives of intravenous iron infusions. The patient is symptomatic from anemia and the iron level is critically low. She tolerated oral iron supplement poorly and desires to achieved higher levels of iron faster for adequate hematopoesis. Some of the side-effects to be expected including risks of infusion reactions, phlebitis, headaches, nausea and fatigue.  The patient is willing to proceed. Patient education material was dispensed. Goal is to keep ferritin level greater than 50 and resolution of anemia and TSAT 25-30 -Oral iron supplementation every other day -Repeat labs in 6 weeks   Chronic kidney disease Patient has history of chronic kidney disease and follows with Dr. Wolfgang Phoenix -Continue to follow with Dr. Wolfgang Phoenix   Orders Placed This Encounter  Procedures   Ferritin    Standing Status:   Future     Standing Expiration Date:   07/01/2024   CBC with Differential/Platelet    Standing Status:   Future    Standing Expiration Date:   07/01/2024   Comprehensive metabolic panel    Standing Status:   Future    Standing Expiration Date:   07/01/2024   Vitamin B12    Standing Status:   Future    Standing Expiration Date:   07/01/2024   Folate    Standing Status:   Future    Standing Expiration Date:   07/01/2024   Kappa/lambda light chains    Standing Status:   Future    Standing Expiration Date:   07/01/2024   IgG, IgA, IgM    Standing Status:   Future    Standing Expiration Date:   07/01/2024   Multiple Myeloma Panel (SPEP&IFE w/QIG)    Standing Status:   Future    Standing Expiration Date:   07/01/2024   Iron and TIBC    Standing Status:   Future    Standing Expiration Date:   07/01/2024    Order Specific Question:   Release to patient    Answer:   Immediate [1]    The total time spent in the appointment was 45 minutes encounter with patients including review of chart and various tests results, discussions about plan of care and coordination of care plan   All questions were answered. The patient knows to call the clinic with any problems, questions or concerns. No barriers to learning was detected.   Cindie Crumbly, MD 12/2/20245:13 PM

## 2023-07-02 NOTE — Assessment & Plan Note (Signed)
Patient has an M spike of 0.4 on recent blood work.  IFE consistent with IgG lambda chain.  Slight elevation of free light chains with normal FLC ratio.  No other CRAB criteria.  Patient likely has MGUS. -No indication for bone marrow biopsy or PET scan at this time with low risk MGUS -Repeat labs in 3 months

## 2023-07-11 ENCOUNTER — Inpatient Hospital Stay: Payer: Medicare Other

## 2023-07-11 VITALS — BP 157/58 | HR 70 | Temp 97.0°F | Resp 18

## 2023-07-11 DIAGNOSIS — N189 Chronic kidney disease, unspecified: Secondary | ICD-10-CM | POA: Diagnosis not present

## 2023-07-11 DIAGNOSIS — Z7901 Long term (current) use of anticoagulants: Secondary | ICD-10-CM | POA: Diagnosis not present

## 2023-07-11 DIAGNOSIS — E611 Iron deficiency: Secondary | ICD-10-CM

## 2023-07-11 DIAGNOSIS — D472 Monoclonal gammopathy: Secondary | ICD-10-CM | POA: Diagnosis not present

## 2023-07-11 DIAGNOSIS — Z79899 Other long term (current) drug therapy: Secondary | ICD-10-CM | POA: Diagnosis not present

## 2023-07-11 DIAGNOSIS — D509 Iron deficiency anemia, unspecified: Secondary | ICD-10-CM | POA: Diagnosis not present

## 2023-07-11 MED ORDER — SODIUM CHLORIDE 0.9 % IV SOLN
INTRAVENOUS | Status: DC
Start: 2023-07-11 — End: 2023-07-11

## 2023-07-11 MED ORDER — ACETAMINOPHEN 325 MG PO TABS
650.0000 mg | ORAL_TABLET | Freq: Once | ORAL | Status: AC
  Administered 2023-07-11: 650 mg via ORAL
  Filled 2023-07-11: qty 2

## 2023-07-11 MED ORDER — SODIUM CHLORIDE 0.9 % IV SOLN
510.0000 mg | Freq: Once | INTRAVENOUS | Status: AC
  Administered 2023-07-11: 510 mg via INTRAVENOUS
  Filled 2023-07-11: qty 510

## 2023-07-11 NOTE — Progress Notes (Signed)
Patient presents today for Feraheme infusion per providers order.  Vital signs WNL.  Patient has no new complaints at this time.  Peripheral IV started and blood return noted pre and post infusion.    Stable during infusion without adverse affects.  Vital signs stable.  No complaints at this time.  Discharge from clinic ambulatory in stable condition.  Alert and oriented X 3.  Follow up with [facility] as scheduled.  

## 2023-07-11 NOTE — Patient Instructions (Signed)
[facility] - A DE[facility] OF [facility]  Discharge Instructions: Thank you for choosing [facility] to provide your oncology and hematology care.  If you have a lab appointment with the Cancer Center - please note that after April 8th, 2024, all labs will be drawn in the cancer center.  You do not have to check in or register with the main entrance as you have in the past but will complete your check-in in the cancer center.  Wear comfortable clothing and clothing appropriate for easy access to any Portacath or PICC line.   We strive to give you quality time with your provider. You may need to reschedule your appointment if you arrive late (15 or more minutes).  Arriving late affects you and other patients whose appointments are after yours.  Also, if you miss three or more appointments without notifying the office, you may be dismissed from the clinic at the provider's discretion.      For prescription refill requests, have your pharmacy contact our office and allow 72 hours for refills to be completed.    Today you received the following chemotherapy and/or immunotherapy agents Feraheme      To help prevent nausea and vomiting after your treatment, we encourage you to take your nausea medication as directed.  BELOW ARE SYMPTOMS THAT SHOULD BE REPORTED IMMEDIATELY: *FEVER GREATER THAN 100.4 F (38 C) OR HIGHER *CHILLS OR SWEATING *NAUSEA AND VOMITING THAT IS NOT CONTROLLED WITH YOUR NAUSEA MEDICATION *UNUSUAL SHORTNESS OF BREATH *UNUSUAL BRUISING OR BLEEDING *URINARY PROBLEMS (pain or burning when urinating, or frequent urination) *BOWEL PROBLEMS (unusual diarrhea, constipation, pain near the anus) TENDERNESS IN MOUTH AND THROAT WITH OR WITHOUT PRESENCE OF ULCERS (sore throat, sores in mouth, or a toothache) UNUSUAL RASH, SWELLING OR PAIN  UNUSUAL VAGINAL DISCHARGE OR ITCHING   Items with * indicate a potential emergency and should be followed up  as soon as possible or go to the Emergency Department if any problems should occur.  Please show the CHEMOTHERAPY ALERT CARD or IMMUNOTHERAPY ALERT CARD at check-in to the Emergency Department and triage nurse.  Should you have questions after your visit or need to cancel or reschedule your appointment, please contact [facility] [place] - A DEPT OF [facility] [phone]  and follow the prompts.  Office hours are 8:00 a.m. to 4:30 p.m. Monday - Friday. Please note that voicemails left after 4:00 p.m. may not be returned until the following business day.  We are closed weekends and major holidays. You have access to a nurse at all times for urgent questions. Please call the main number to the clinic [phone] and follow the prompts.  For any non-urgent questions, you may also contact your provider using MyChart. We now offer e-Visits for anyone 81 and older to request care online for non-urgent symptoms. For details visit [email or web address].   Also download the MyChart app! Go to the app store, search "MyChart", open the app, select [place], and log in with your MyChart username and password.

## 2023-07-16 DIAGNOSIS — M79672 Pain in left foot: Secondary | ICD-10-CM | POA: Diagnosis not present

## 2023-07-16 DIAGNOSIS — L110 Acquired keratosis follicularis: Secondary | ICD-10-CM | POA: Diagnosis not present

## 2023-07-16 DIAGNOSIS — I739 Peripheral vascular disease, unspecified: Secondary | ICD-10-CM | POA: Diagnosis not present

## 2023-07-16 DIAGNOSIS — E1140 Type 2 diabetes mellitus with diabetic neuropathy, unspecified: Secondary | ICD-10-CM | POA: Diagnosis not present

## 2023-07-16 DIAGNOSIS — M79671 Pain in right foot: Secondary | ICD-10-CM | POA: Diagnosis not present

## 2023-07-16 DIAGNOSIS — M79675 Pain in left toe(s): Secondary | ICD-10-CM | POA: Diagnosis not present

## 2023-07-16 DIAGNOSIS — M79674 Pain in right toe(s): Secondary | ICD-10-CM | POA: Diagnosis not present

## 2023-07-18 ENCOUNTER — Other Ambulatory Visit: Payer: Self-pay | Admitting: Family Medicine

## 2023-07-18 ENCOUNTER — Inpatient Hospital Stay: Payer: Medicare Other

## 2023-07-19 ENCOUNTER — Inpatient Hospital Stay: Payer: Medicare Other

## 2023-07-19 VITALS — BP 143/60 | HR 71 | Temp 97.0°F | Resp 18

## 2023-07-19 DIAGNOSIS — D472 Monoclonal gammopathy: Secondary | ICD-10-CM | POA: Diagnosis not present

## 2023-07-19 DIAGNOSIS — N189 Chronic kidney disease, unspecified: Secondary | ICD-10-CM | POA: Diagnosis not present

## 2023-07-19 DIAGNOSIS — Z7901 Long term (current) use of anticoagulants: Secondary | ICD-10-CM | POA: Diagnosis not present

## 2023-07-19 DIAGNOSIS — E611 Iron deficiency: Secondary | ICD-10-CM

## 2023-07-19 DIAGNOSIS — Z79899 Other long term (current) drug therapy: Secondary | ICD-10-CM | POA: Diagnosis not present

## 2023-07-19 DIAGNOSIS — D509 Iron deficiency anemia, unspecified: Secondary | ICD-10-CM | POA: Diagnosis not present

## 2023-07-19 MED ORDER — ACETAMINOPHEN 325 MG PO TABS
650.0000 mg | ORAL_TABLET | Freq: Once | ORAL | Status: AC
  Administered 2023-07-19: 650 mg via ORAL
  Filled 2023-07-19: qty 2

## 2023-07-19 MED ORDER — SODIUM CHLORIDE 0.9 % IV SOLN
510.0000 mg | Freq: Once | INTRAVENOUS | Status: AC
  Administered 2023-07-19: 510 mg via INTRAVENOUS
  Filled 2023-07-19: qty 510

## 2023-07-19 MED ORDER — SODIUM CHLORIDE 0.9 % IV SOLN: INTRAVENOUS | Status: DC

## 2023-07-19 NOTE — Progress Notes (Signed)
Patient presents today for iron infusion.  Patient is in satisfactory condition with no new complaints voiced.  Vital signs are stable.  We will proceed with infusion per provider orders.    Patient took 1/2 Benadryl at home  prior to arrival.

## 2023-07-19 NOTE — Progress Notes (Signed)
Patient tolerated iron infusion with no complaints voiced.  Peripheral IV site clean and dry with good blood return noted before and after infusion.  Band aid applied.  VSS with discharge and left in satisfactory condition with no s/s of distress noted.   

## 2023-07-19 NOTE — Patient Instructions (Signed)
[facility] [place] - [facility]  Discharge Instructions: Thank you for choosing [facility] to provide your oncology and hematology care.  If you have a lab appointment with the Cancer Center - please note that after April 8th, 2024, all labs will be drawn in the cancer center.  You do not have to check in or register with the main entrance as you have in the past but will complete your check-in in the cancer center.  Wear comfortable clothing and clothing appropriate for easy access to any Portacath or PICC line.   We strive to give you quality time with your provider. You may need to reschedule your appointment if you arrive late (15 or more minutes).  Arriving late affects you and other patients whose appointments are after yours.  Also, if you miss three or more appointments without notifying the office, you may be dismissed from the clinic at the provider's discretion.      For prescription refill requests, have your pharmacy contact our office and allow 72 hours for refills to be completed.    Today you received the following Feraheme, return as scheduled.   To help prevent nausea and vomiting after your treatment, we encourage you to take your nausea medication as directed.  BELOW ARE SYMPTOMS THAT SHOULD BE REPORTED IMMEDIATELY: *FEVER GREATER THAN 100.4 F (38 C) OR HIGHER *CHILLS OR SWEATING *NAUSEA AND VOMITING THAT IS NOT CONTROLLED WITH YOUR NAUSEA MEDICATION *UNUSUAL SHORTNESS OF BREATH *UNUSUAL BRUISING OR BLEEDING *URINARY PROBLEMS (pain or burning when urinating, or frequent urination) *BOWEL PROBLEMS (unusual diarrhea, constipation, pain near the anus) TENDERNESS IN MOUTH AND THROAT WITH OR WITHOUT PRESENCE OF ULCERS (sore throat, sores in mouth, or a toothache) UNUSUAL RASH, SWELLING OR PAIN  UNUSUAL VAGINAL DISCHARGE OR ITCHING   Items with * indicate a potential emergency and should be followed up as soon as possible or  go to the Emergency Department if any problems should occur.  Please show the CHEMOTHERAPY ALERT CARD or IMMUNOTHERAPY ALERT CARD at check-in to the Emergency Department and triage nurse.  Should you have questions after your visit or need to cancel or reschedule your appointment, please contact [facility] [place] - A DEPT OF [facility] [phone]  and follow the prompts.  Office hours are 8:00 a.m. to 4:30 p.m. Monday - Friday. Please note that voicemails left after 4:00 p.m. may not be returned until the following business day.  We are closed weekends and major holidays. You have access to a nurse at all times for urgent questions. Please call the main number to the clinic [phone] and follow the prompts.  For any non-urgent questions, you may also contact your provider using MyChart. We now offer e-Visits for anyone 71 and older to request care online for non-urgent symptoms. For details visit [email or web address].   Also download the MyChart app! Go to the app store, search "MyChart", open the app, select [place], and log in with your MyChart username and password.

## 2023-08-13 ENCOUNTER — Inpatient Hospital Stay: Payer: Medicare Other | Attending: Oncology

## 2023-08-13 ENCOUNTER — Other Ambulatory Visit: Payer: Self-pay | Admitting: Family Medicine

## 2023-08-13 DIAGNOSIS — D509 Iron deficiency anemia, unspecified: Secondary | ICD-10-CM | POA: Insufficient documentation

## 2023-08-13 DIAGNOSIS — E611 Iron deficiency: Secondary | ICD-10-CM

## 2023-08-13 DIAGNOSIS — D472 Monoclonal gammopathy: Secondary | ICD-10-CM | POA: Diagnosis not present

## 2023-08-13 LAB — CBC WITH DIFFERENTIAL/PLATELET
Abs Immature Granulocytes: 0.03 10*3/uL (ref 0.00–0.07)
Basophils Absolute: 0.1 10*3/uL (ref 0.0–0.1)
Basophils Relative: 1 %
Eosinophils Absolute: 0.1 10*3/uL (ref 0.0–0.5)
Eosinophils Relative: 1 %
HCT: 40.1 % (ref 36.0–46.0)
Hemoglobin: 12.7 g/dL (ref 12.0–15.0)
Immature Granulocytes: 0 %
Lymphocytes Relative: 28 %
Lymphs Abs: 2.7 10*3/uL (ref 0.7–4.0)
MCH: 28.7 pg (ref 26.0–34.0)
MCHC: 31.7 g/dL (ref 30.0–36.0)
MCV: 90.7 fL (ref 80.0–100.0)
Monocytes Absolute: 0.7 10*3/uL (ref 0.1–1.0)
Monocytes Relative: 7 %
Neutro Abs: 6 10*3/uL (ref 1.7–7.7)
Neutrophils Relative %: 63 %
Platelets: 209 10*3/uL (ref 150–400)
RBC: 4.42 MIL/uL (ref 3.87–5.11)
RDW: 14.5 % (ref 11.5–15.5)
WBC: 9.5 10*3/uL (ref 4.0–10.5)
nRBC: 0 % (ref 0.0–0.2)

## 2023-08-13 LAB — IRON AND TIBC
Iron: 95 ug/dL (ref 28–170)
Saturation Ratios: 30 % (ref 10.4–31.8)
TIBC: 315 ug/dL (ref 250–450)
UIBC: 220 ug/dL

## 2023-08-13 LAB — COMPREHENSIVE METABOLIC PANEL
ALT: 19 U/L (ref 0–44)
AST: 20 U/L (ref 15–41)
Albumin: 4 g/dL (ref 3.5–5.0)
Alkaline Phosphatase: 61 U/L (ref 38–126)
Anion gap: 9 (ref 5–15)
BUN: 30 mg/dL — ABNORMAL HIGH (ref 8–23)
CO2: 24 mmol/L (ref 22–32)
Calcium: 9.7 mg/dL (ref 8.9–10.3)
Chloride: 97 mmol/L — ABNORMAL LOW (ref 98–111)
Creatinine, Ser: 1.23 mg/dL — ABNORMAL HIGH (ref 0.44–1.00)
GFR, Estimated: 43 mL/min — ABNORMAL LOW (ref >60)
Glucose, Bld: 152 mg/dL — ABNORMAL HIGH (ref 70–99)
Potassium: 4.2 mmol/L (ref 3.5–5.1)
Sodium: 130 mmol/L — ABNORMAL LOW (ref 135–145)
Total Bilirubin: 0.5 mg/dL (ref 0.0–1.2)
Total Protein: 8 g/dL (ref 6.5–8.1)

## 2023-08-13 LAB — VITAMIN B12: Vitamin B-12: 759 pg/mL (ref 180–914)

## 2023-08-13 LAB — FERRITIN: Ferritin: 498 ng/mL — ABNORMAL HIGH (ref 11–307)

## 2023-08-13 LAB — FOLATE: Folate: 23.7 ng/mL (ref >5.9)

## 2023-08-14 LAB — IGG, IGA, IGM
IgA: 384 mg/dL (ref 64–422)
IgG (Immunoglobin G), Serum: 1508 mg/dL (ref 586–1602)
IgM (Immunoglobulin M), Srm: 54 mg/dL (ref 26–217)

## 2023-08-14 LAB — KAPPA/LAMBDA LIGHT CHAINS
Kappa free light chain: 70.7 mg/L — ABNORMAL HIGH (ref 3.3–19.4)
Kappa, lambda light chain ratio: 1.19 (ref 0.26–1.65)
Lambda free light chains: 59.4 mg/L — ABNORMAL HIGH (ref 5.7–26.3)

## 2023-08-14 MED ORDER — SPIRONOLACTONE 25 MG PO TABS: ORAL_TABLET | ORAL | 0 refills | Status: DC

## 2023-08-20 ENCOUNTER — Inpatient Hospital Stay: Payer: Medicare Other | Admitting: Oncology

## 2023-08-20 LAB — MULTIPLE MYELOMA PANEL, SERUM
Albumin SerPl Elph-Mcnc: 4 g/dL (ref 2.9–4.4)
Albumin/Glob SerPl: 1 (ref 0.7–1.7)
Alpha 1: 0.3 g/dL (ref 0.0–0.4)
Alpha2 Glob SerPl Elph-Mcnc: 1.1 g/dL — ABNORMAL HIGH (ref 0.4–1.0)
B-Globulin SerPl Elph-Mcnc: 1.4 g/dL — ABNORMAL HIGH (ref 0.7–1.3)
Gamma Glob SerPl Elph-Mcnc: 1.3 g/dL (ref 0.4–1.8)
Globulin, Total: 4.1 g/dL — ABNORMAL HIGH (ref 2.2–3.9)
IgA: 400 mg/dL (ref 64–422)
IgG (Immunoglobin G), Serum: 1527 mg/dL (ref 586–1602)
IgM (Immunoglobulin M), Srm: 59 mg/dL (ref 26–217)
M Protein SerPl Elph-Mcnc: 0.4 g/dL — ABNORMAL HIGH
Total Protein ELP: 8.1 g/dL (ref 6.0–8.5)

## 2023-08-23 ENCOUNTER — Ambulatory Visit: Payer: Medicare Other | Admitting: Urology

## 2023-08-30 ENCOUNTER — Inpatient Hospital Stay: Payer: Medicare Other | Admitting: Oncology

## 2023-09-03 ENCOUNTER — Inpatient Hospital Stay: Payer: Medicare Other | Attending: Oncology | Admitting: Oncology

## 2023-09-03 VITALS — BP 134/64 | HR 78 | Temp 98.0°F | Resp 18 | Ht 63.0 in | Wt 174.0 lb

## 2023-09-03 DIAGNOSIS — E1140 Type 2 diabetes mellitus with diabetic neuropathy, unspecified: Secondary | ICD-10-CM | POA: Insufficient documentation

## 2023-09-03 DIAGNOSIS — Z79899 Other long term (current) drug therapy: Secondary | ICD-10-CM | POA: Diagnosis not present

## 2023-09-03 DIAGNOSIS — Z7901 Long term (current) use of anticoagulants: Secondary | ICD-10-CM | POA: Insufficient documentation

## 2023-09-03 DIAGNOSIS — D508 Other iron deficiency anemias: Secondary | ICD-10-CM | POA: Diagnosis not present

## 2023-09-03 DIAGNOSIS — D509 Iron deficiency anemia, unspecified: Secondary | ICD-10-CM | POA: Diagnosis not present

## 2023-09-03 DIAGNOSIS — E1122 Type 2 diabetes mellitus with diabetic chronic kidney disease: Secondary | ICD-10-CM | POA: Insufficient documentation

## 2023-09-03 DIAGNOSIS — N1832 Chronic kidney disease, stage 3b: Secondary | ICD-10-CM

## 2023-09-03 DIAGNOSIS — Z7984 Long term (current) use of oral hypoglycemic drugs: Secondary | ICD-10-CM | POA: Insufficient documentation

## 2023-09-03 DIAGNOSIS — D472 Monoclonal gammopathy: Secondary | ICD-10-CM | POA: Diagnosis not present

## 2023-09-03 DIAGNOSIS — N189 Chronic kidney disease, unspecified: Secondary | ICD-10-CM | POA: Diagnosis not present

## 2023-09-03 DIAGNOSIS — E611 Iron deficiency: Secondary | ICD-10-CM

## 2023-09-03 NOTE — Assessment & Plan Note (Signed)
 Patient has history of chronic kidney disease and follows with Dr. Wolfgang Phoenix -Continue to follow with Dr. Wolfgang Phoenix

## 2023-09-03 NOTE — Progress Notes (Signed)
[facility] at [address] [facility] HEMATOLOGY FOLLOW-UP VISIT  Leslie Sams, DO  REASON FOR FOLLOW-UP: MGUs and iron deficiency  ASSESSMENT & PLAN:  Patient is an 88 year old female with chronic kidney Disease following for MGUS and iron deficiency   MGUS (monoclonal gammopathy of unknown significance) Patient has an M spike of 0.4 on recent blood work.  IFE consistent with IgG lambda chain.  Slight elevation of free light chains with normal FLC ratio.  No other CRAB criteria.  Stable labs since last visit. -No indication for bone marrow biopsy or PET scan at this time with low risk MGUS -Repeat labs in 6 months  Iron deficiency Patient has iron deficiency with no anemia.  S/p 2 doses of IV Feraheme.  Significant improvement in iron levels.  Patient reports improved energy levels after iron supplementation. -Continue oral iron every other day  Chronic kidney disease Patient has history of chronic kidney disease and follows with Dr. Wolfgang Phoenix -Continue to follow with Dr. Wolfgang Phoenix   Orders Placed This Encounter  Procedures   CBC with Differential/Platelet    Standing Status:   Future    Expected Date:   02/29/2024    Expiration Date:   09/02/2024   Comprehensive metabolic panel    Standing Status:   Future    Expected Date:   02/29/2024    Expiration Date:   09/02/2024   Ferritin    Standing Status:   Future    Expected Date:   02/29/2024    Expiration Date:   09/02/2024   Folate    Standing Status:   Future    Expected Date:   02/29/2024    Expiration Date:   09/02/2024   Vitamin B12    Standing Status:   Future    Expected Date:   02/29/2024    Expiration Date:   09/02/2024   Kappa/lambda light chains    Standing Status:   Future    Expected Date:   02/29/2024    Expiration Date:   09/02/2024   Multiple Myeloma Panel (SPEP&IFE w/QIG)    Standing Status:   Future    Expected Date:   02/29/2024    Expiration Date:   09/02/2024   Iron and TIBC    Standing Status:   Future     Expected Date:   02/29/2024    Expiration Date:   09/02/2024    The total time spent in the appointment was 20 minutes encounter with patients including review of chart and various tests results, discussions about plan of care and coordination of care plan   All questions were answered. The patient knows to call the clinic with any problems, questions or concerns. No barriers to learning was detected.  Cindie Crumbly, MD 2/3/20251:36 PM   SUMMARY OF HEMATOLOGIC HISTORY: MGUS: -02/07/2023: M spike: 0.4, IFE: IgG monoclonal light chain -05/08/2023: M spike: 0.4 -08/13/2023: M spike: 0.4  Iron deficiency: -S/p IV Feraheme in 2 to doses 07/11/2023, 07/19/2023   INTERVAL HISTORY: Leslie Kim 88 y.o. female following for MGUS and iron deficiency.  She is accompanied by her daughter today.  She has a history of CKD which is stable.Patient does report some improvement in energy levels since the last IV iron infusion.  She still complains of ongoing fatigue and shortness of breath.The patient also suffers from diabetic neuropathy, which causes discomfort in her feet, particularly at night.  She has no other complaints today and is still able to cook for the  extended family.  I have reviewed the past medical history, past surgical history, social history and family history with the patient   ALLERGIES:  is allergic to codeine.  MEDICATIONS:  Current Outpatient Medications  Medication Sig Dispense Refill   Accu-Chek Softclix Lancets lancets Use to check blood sugars daily before meal dx E11.22, N18.32 100 each 12   acetaminophen (TYLENOL) 325 MG tablet Take 2 tablets (650 mg total) by mouth every 4 (four) hours as needed for mild pain (or temp > 37.5 C (99.5 F)).     albuterol (PROVENTIL) (2.5 MG/3ML) 0.083% nebulizer solution Take 3 mLs (2.5 mg total) by nebulization every 6 (six) hours as needed for wheezing or shortness of breath. 150 mL 1   albuterol (VENTOLIN HFA) 108 (90 Base) MCG/ACT  inhaler Inhale 2 puffs into the lungs every 6 (six) hours as needed for wheezing or shortness of breath. 18 g 3   apixaban (ELIQUIS) 5 MG TABS tablet TAKE (1) TABLET BY MOUTH TWICE DAILY. 60 tablet 11   b complex vitamins tablet Take 1 tablet by mouth every other day.     blood glucose meter kit and supplies Dispense based on patient and insurance preference use daily to check blood sugars  E11.9 1 each 1   carvedilol (COREG) 6.25 MG tablet TAKE 1 TABLET(6.25 MG) BY MOUTH TWICE DAILY WITH A MEAL 180 tablet 3   cholecalciferol (VITAMIN D3) 25 MCG (1000 UNIT) tablet Take 2 tablets (2,000 Units total) by mouth daily. 30 tablet 0   ferrous sulfate 324 MG TBEC Take 1 tablet (324 mg total) by mouth daily with breakfast. 30 tablet 1   Fluticasone-Umeclidin-Vilant (TRELEGY ELLIPTA) 100-62.5-25 MCG/ACT AEPB INHALE 1 PUFF INTO THE LUNGS DAILY IN THE AFTERNOON 60 each 1   furosemide (LASIX) 20 MG tablet 1/2 tab daily 30 tablet 11   glucose blood test strip Use to check blood sugars daily as needed before meals dx code E11.22, N18.32 100 each 12   losartan (COZAAR) 50 MG tablet Take 1.5 tablets (75 mg total) by mouth daily. Patient taking 1 1/2 tablet by mouth (75 mg Total) daily 145 tablet 3   Magnesium 250 MG TABS Take 1 tablet by mouth daily.     melatonin 3 MG TABS tablet Take 2 tablets (6 mg total) by mouth at bedtime. 60 tablet 1   pantoprazole (PROTONIX) 40 MG tablet Take 1 tablet (40 mg total) by mouth daily. 90 tablet 3   sitaGLIPtin-metformin (JANUMET) 50-500 MG tablet Take 1 tablet by mouth 2 (two) times daily with a meal. 180 tablet 1   spironolactone (ALDACTONE) 25 MG tablet TAKE 1/2 TABLET(12.5 MG) BY MOUTH EVERY DAY 45 tablet 0   No current facility-administered medications for this visit.     REVIEW OF SYSTEMS:   Constitutional: Denies fevers, chills or night sweats Eyes: Denies blurriness of vision Ears, nose, mouth, throat, and face: Denies mucositis or sore throat Respiratory: Denies  cough, dyspnea or wheezes Cardiovascular: Denies palpitation, chest discomfort or lower extremity swelling Gastrointestinal:  Denies nausea, heartburn or change in bowel habits Skin: Denies abnormal skin rashes Lymphatics: Denies new lymphadenopathy or easy bruising Neurological:Denies numbness, tingling or new weaknesses Behavioral/Psych: Mood is stable, no new changes  All other systems were reviewed with the patient and are negative.  PHYSICAL EXAMINATION:   Vitals:   09/03/23 1315  BP: 134/64  Pulse: 78  Resp: 18  Temp: 98 F (36.7 C)  SpO2: 96%    GENERAL:alert, no distress and  comfortable SKIN: skin color, texture, turgor are normal, no rashes or significant lesions LUNGS: clear to auscultation and percussion with normal breathing effort HEART: regular rate & rhythm and no murmurs and no lower extremity edema ABDOMEN:abdomen soft, non-tender and normal bowel sounds Musculoskeletal:no cyanosis of digits and no clubbing  NEURO: alert & oriented x 3 with fluent speech  LABORATORY DATA:  I have reviewed the data as listed  Lab Results  Component Value Date   WBC 9.5 08/13/2023   NEUTROABS 6.0 08/13/2023   HGB 12.7 08/13/2023   HCT 40.1 08/13/2023   MCV 90.7 08/13/2023   PLT 209 08/13/2023       Chemistry      Component Value Date/Time   NA 130 (L) 08/13/2023 1355   NA 133 (L) 12/14/2022 1226   K 4.2 08/13/2023 1355   CL 97 (L) 08/13/2023 1355   CO2 24 08/13/2023 1355   BUN 30 (H) 08/13/2023 1355   BUN 26 12/14/2022 1226   CREATININE 1.23 (H) 08/13/2023 1355   CREATININE 1.08 (H) 03/28/2016 1252      Component Value Date/Time   CALCIUM 9.7 08/13/2023 1355   ALKPHOS 61 08/13/2023 1355   AST 20 08/13/2023 1355   ALT 19 08/13/2023 1355   BILITOT 0.5 08/13/2023 1355   BILITOT 0.2 12/14/2022 1226      Latest Reference Range & Units 08/12/19 16:10 08/13/19 05:36 08/14/19 05:56 11/20/19 14:59 12/12/19 12:31 10/15/21 05:18 10/19/21 06:00 08/13/23 13:55   Iron 28 - 170 ug/dL    20 (L) 26 (L) 960 40 95  UIBC ug/dL    454 (H) 098 119 147 220  TIBC 250 - 450 ug/dL    829 562 (H) 130 865 315  Saturation Ratios 10.4 - 31.8 %     6 (L) 27 10 (L) 30  Ferritin 11 - 307 ng/mL 54 50 55 14 (L) 9 (L) 14  498 (H)  Iron Saturation 15 - 55 %    5 (LL)      Folate >5.9 ng/mL     46.8 32.3  23.7  (LL): Data is critically low (L): Data is abnormally low (H): Data is abnormally high   Latest Reference Range & Units 11/20/19 14:59 12/12/19 12:31 12/09/21 10:50 06/14/22 11:40 12/14/22 12:26 08/13/23 13:55  Total Protein ELP 6.0 - 8.5 g/dL  7.6    8.1 (C)  Albumin ELP 2.9 - 4.4 g/dL  3.7      Albumin SerPl Elph-Mcnc 2.9 - 4.4 g/dL      4.0 (C)  Albumin/Glob SerPl 0.7 - 1.7       1.0 (C)  Alpha2 Glob SerPl Elph-Mcnc 0.4 - 1.0 g/dL      1.1 (H) (C)  Alpha 1 0.0 - 0.4 g/dL      0.3 (C)  Gamma Glob SerPl Elph-Mcnc 0.4 - 1.8 g/dL      1.3 (C)  M Protein SerPl Elph-Mcnc Not Observed g/dL      0.4 (H) (C)  IFE 1       Comment ! (C)  Globulin, Total 2.2 - 3.9 g/dL 3.3 3.9 (C) 3.1 3.4 3.5 4.1 (H) (C)  A/G Ratio 0.7 - 1.7   0.9 (C)      Alpha-1-Globulin 0.0 - 0.4 g/dL  0.2      HQION-6-EXBMWUXL 0.4 - 1.0 g/dL  1.1 (H)      Beta Globulin 0.7 - 1.3 g/dL  1.3      B-Globulin SerPl Elph-Mcnc 0.7 -  1.3 g/dL      1.4 (H) (C)  Gamma Globulin 0.4 - 1.8 g/dL  1.3      M-SPIKE, % Not Observed g/dL  Not Observed      SPE Interp.   Comment      Comment   Comment      IgG (Immunoglobin G), Serum 586 - 1,602 mg/dL 161 - 0,960 mg/dL      4,540 9,811  IgM (Immunoglobulin M), Srm 26 - 217 mg/dL 26 - 914 mg/dL      54 59  IgA 64 - 782 mg/dL 64 - 956 mg/dL      213 086  (H): Data is abnormally high !: Data is abnormal (C): Corrected   Latest Reference Range & Units 08/13/23 13:55  Kappa free light chain 3.3 - 19.4 mg/L 70.7 (H)  Lambda free light chains 5.7 - 26.3 mg/L 59.4 (H)  Kappa, lambda light chain ratio 0.26 - 1.65  1.19  (H): Data is abnormally high

## 2023-09-03 NOTE — Assessment & Plan Note (Signed)
Patient has iron deficiency with no anemia.  S/p 2 doses of IV Feraheme.  Significant improvement in iron levels.  Patient reports improved energy levels after iron supplementation. -Continue oral iron every other day

## 2023-09-03 NOTE — Assessment & Plan Note (Signed)
Patient has an M spike of 0.4 on recent blood work.  IFE consistent with IgG lambda chain.  Slight elevation of free light chains with normal FLC ratio.  No other CRAB criteria.  Stable labs since last visit. -No indication for bone marrow biopsy or PET scan at this time with low risk MGUS -Repeat labs in 6 months

## 2023-09-03 NOTE — Patient Instructions (Signed)
VISIT SUMMARY:  During today's visit, we discussed your ongoing fatigue and shortness of breath, which have not changed since your last visit. Your energy levels have slightly improved with daily iron supplements, but you still experience breathlessness during exertion. We also reviewed your diabetic neuropathy and its management. Additionally, we talked about the frequency of your appointments.  YOUR PLAN:  -MONOCLONAL GAMMOPATHY OF UNDETERMINED SIGNIFICANCE (MGUS): MGUS is a condition where an abnormal protein is in your blood, which can sometimes progress to more serious diseases. Your condition is stable with no signs of progression to multiple myeloma. We will continue to monitor this every six months.  -IRON DEFICIENCY : Iron deficiency  occurs when your body doesn't have enough iron to produce adequate red blood cells. Your energy levels have improved with iron supplements, and you currently do not have anemia. We will reduce your iron supplementation to every other day to minimize constipation.  INSTRUCTIONS:  Please continue monitoring your MGUS every six months. Reduce your iron supplementation to every other day. Follow up in six months (August 2025) for your next appointment.

## 2023-09-10 ENCOUNTER — Ambulatory Visit: Payer: Medicare Other | Attending: Cardiology | Admitting: Cardiology

## 2023-09-10 ENCOUNTER — Encounter: Payer: Self-pay | Admitting: Cardiology

## 2023-09-10 VITALS — BP 142/78 | HR 83 | Ht 63.0 in | Wt 174.0 lb

## 2023-09-10 DIAGNOSIS — I480 Paroxysmal atrial fibrillation: Secondary | ICD-10-CM | POA: Diagnosis not present

## 2023-09-10 DIAGNOSIS — I5032 Chronic diastolic (congestive) heart failure: Secondary | ICD-10-CM

## 2023-09-10 DIAGNOSIS — M791 Myalgia, unspecified site: Secondary | ICD-10-CM | POA: Diagnosis not present

## 2023-09-10 DIAGNOSIS — T466X5D Adverse effect of antihyperlipidemic and antiarteriosclerotic drugs, subsequent encounter: Secondary | ICD-10-CM

## 2023-09-10 DIAGNOSIS — T466X5A Adverse effect of antihyperlipidemic and antiarteriosclerotic drugs, initial encounter: Secondary | ICD-10-CM

## 2023-09-10 DIAGNOSIS — E782 Mixed hyperlipidemia: Secondary | ICD-10-CM

## 2023-09-10 NOTE — Patient Instructions (Addendum)

## 2023-09-10 NOTE — Progress Notes (Signed)
    Cardiology Office Note  Date: 09/10/2023   ID: AZA KOPKA, DOB 06-Feb-1936, MRN [id number]  History of Present Illness: Leslie Kim is an 88 y.o. female last seen in August 2024.  She is here today with her daughter for a follow-up visit.  From a cardiac perspective, she does not report any sense of palpitations, has had no exertional chest pain.  She is due to see her PCP soon.  I reviewed the chart as well, she has had iron infusions for treatment of iron deficiency anemia with improvement in hemoglobin, also seen in hematology for assessment of MGUS..  I reviewed her medications.  Current regimen includes Eliquis , Coreg , Lasix , Cozaar , and Aldactone .  She does not report any spontaneous bleeding problems.  Blood pressure mildly elevated today.  Outside clinic measurement on February 3 was 134/64.  I reviewed her interval lab work which is noted below.  Physical Exam: VS:  BP (!) 142/78   Pulse 83   Ht 5\' 3"  (1.6 m)   Wt 174 lb (78.9 kg)   SpO2 96%   BMI 30.82 kg/m , BMI Body mass index is 30.82 kg/m.  Wt Readings from Last 3 Encounters:  09/10/23 174 lb (78.9 kg)  09/03/23 174 lb (78.9 kg)  07/02/23 173 lb 8 oz (78.7 kg)    General: Patient appears comfortable at rest. HEENT: Conjunctiva and lids normal. Neck: Supple, no elevated JVP or carotid bruits. Lungs: Clear to auscultation, nonlabored breathing at rest. Cardiac: Regular rate and rhythm, no S3, 1/6 systolic murmur..  ECG:  An ECG dated 03/07/2023 was personally reviewed today and demonstrated:  Sinus rhythm with PACs, IVCD with poor R wave progression and nonspecific ST changes.  Labwork: 08/13/2023: ALT 19; AST 20; BUN 30; Creatinine, Ser 1.23; Hemoglobin 12.7; Platelets 209; Potassium 4.2; Sodium 130     Component Value Date/Time   CHOL 241 (H) 12/14/2022 1226   TRIG 245 (H) 12/14/2022 1226   HDL 55 12/14/2022 1226   CHOLHDL 4.4 12/14/2022 1226   CHOLHDL 4.5 10/14/2021 0342   VLDL 28 10/14/2021 0342    LDLCALC 142 (H) 12/14/2022 1226   Other Studies Reviewed Today:  No interval cardiac testing for review today.  Assessment and Plan:  1.  HFrecEF with history of nonischemic cardiomyopathy and normalization of LVEF in the range of 55 to 60% by echocardiogram and March 2023.  She is symptomatically stable, no significant fluid retention.  Current regimen includes Coreg , Lasix , Aldactone , and Cozaar .  Previously had intolerance to Jardiance .  I reviewed her interval lab work.  2.  Paroxysmal atrial fibrillation with CHA2DS2-VASc score of 7.  She reports no palpitations and has regular heart rate today.  Continue Eliquis  and Coreg .   3.  Mixed hyperlipidemia with statin myalgias including both Lipitor and Crestor .  She has not wanted to consider other treatment options.  LDL 142 in May 2024.  Disposition:  Follow up  6 months.  Signed, Gerard Knight, M.D., F.A.C.C. [facility] at [facility]

## 2023-09-18 ENCOUNTER — Other Ambulatory Visit: Payer: Self-pay | Admitting: Family Medicine

## 2023-09-18 ENCOUNTER — Ambulatory Visit (INDEPENDENT_AMBULATORY_CARE_PROVIDER_SITE_OTHER): Payer: Medicare Other | Admitting: Family Medicine

## 2023-09-18 VITALS — BP 126/68 | Ht 63.0 in | Wt 172.2 lb

## 2023-09-18 DIAGNOSIS — E785 Hyperlipidemia, unspecified: Secondary | ICD-10-CM | POA: Diagnosis not present

## 2023-09-18 DIAGNOSIS — I10 Essential (primary) hypertension: Secondary | ICD-10-CM

## 2023-09-18 DIAGNOSIS — J449 Chronic obstructive pulmonary disease, unspecified: Secondary | ICD-10-CM

## 2023-09-18 DIAGNOSIS — R109 Unspecified abdominal pain: Secondary | ICD-10-CM

## 2023-09-18 DIAGNOSIS — S31000D Unspecified open wound of lower back and pelvis without penetration into retroperitoneum, subsequent encounter: Secondary | ICD-10-CM

## 2023-09-18 DIAGNOSIS — N1832 Chronic kidney disease, stage 3b: Secondary | ICD-10-CM | POA: Diagnosis not present

## 2023-09-18 DIAGNOSIS — E1122 Type 2 diabetes mellitus with diabetic chronic kidney disease: Secondary | ICD-10-CM | POA: Diagnosis not present

## 2023-09-18 DIAGNOSIS — Z7984 Long term (current) use of oral hypoglycemic drugs: Secondary | ICD-10-CM

## 2023-09-18 DIAGNOSIS — T1490XD Injury, unspecified, subsequent encounter: Secondary | ICD-10-CM

## 2023-09-18 LAB — POCT URINALYSIS DIPSTICK
Nitrite, UA: POSITIVE
Spec Grav, UA: 1.015 (ref 1.010–1.025)
pH, UA: 6 (ref 5.0–8.0)

## 2023-09-18 MED ORDER — MUPIROCIN 2 % EX OINT
1.0000 | TOPICAL_OINTMENT | Freq: Two times a day (BID) | CUTANEOUS | 0 refills | Status: AC
Start: 2023-09-18 — End: 2023-09-25

## 2023-09-18 NOTE — Patient Instructions (Signed)
 Labs today.  Xrays at the hospital.  Ointment as prescribed.  Follow up in 3 months.

## 2023-09-19 DIAGNOSIS — I129 Hypertensive chronic kidney disease with stage 1 through stage 4 chronic kidney disease, or unspecified chronic kidney disease: Secondary | ICD-10-CM | POA: Diagnosis not present

## 2023-09-19 DIAGNOSIS — T1490XD Injury, unspecified, subsequent encounter: Secondary | ICD-10-CM | POA: Insufficient documentation

## 2023-09-19 DIAGNOSIS — R809 Proteinuria, unspecified: Secondary | ICD-10-CM | POA: Diagnosis not present

## 2023-09-19 DIAGNOSIS — E1122 Type 2 diabetes mellitus with diabetic chronic kidney disease: Secondary | ICD-10-CM | POA: Diagnosis not present

## 2023-09-19 DIAGNOSIS — R109 Unspecified abdominal pain: Secondary | ICD-10-CM | POA: Insufficient documentation

## 2023-09-19 DIAGNOSIS — E875 Hyperkalemia: Secondary | ICD-10-CM | POA: Diagnosis not present

## 2023-09-19 MED ORDER — JANUMET 50-500 MG PO TABS: 1.0000 | ORAL_TABLET | Freq: Every day | ORAL | 1 refills | Status: DC

## 2023-09-19 NOTE — Assessment & Plan Note (Signed)
 Stable.  Continue current medications.

## 2023-09-19 NOTE — Assessment & Plan Note (Signed)
 A1c today to assess.  Decreasing dosage of Janumet due to renal function.

## 2023-09-19 NOTE — Assessment & Plan Note (Signed)
 Benign exam.  KUB and x-ray lumbar spine for further evaluation.

## 2023-09-19 NOTE — Progress Notes (Signed)
 Subjective:  Patient ID: Leslie Kim, female    DOB: August 28, 1935  Age: 88 y.o. MRN: [id number]  CC:   Chief Complaint  Patient presents with   Hypertension    Follow up    HPI:  88 year old female presents for follow-up.  Patient states that she has an area on her low back that she would like me to look at.  She is concerned that she had some bug bites.  It is now scabbed up.  It itches.  No drainage.  Patient also reports ongoing right-sided low back pain/right flank pain.  Intermittent.  Has been severe at times.  No pain currently.  Hypertension stable on Losartan, Aldactone, Coreg, Lasix.  Diabetes has been stable.  She states that she needs a refill on Janumet.  COPD stable.  Needs refill on Trelegy.  Patient Active Problem List   Diagnosis Date Noted   Healing wound 09/19/2023   Right flank pain 09/19/2023   MGUS (monoclonal gammopathy of unknown significance) 07/02/2023   Iron deficiency 07/02/2023   Hepatic steatosis 09/15/2022   History of stroke 12/09/2021   Hyperlipidemia 12/09/2021   Essential hypertension    Anemia in chronic kidney disease 12/12/2019   Chronic kidney disease 12/12/2019   Chronic obstructive pulmonary disease (HCC) 10/21/2019   Gastroesophageal reflux disease without esophagitis 10/21/2019   Atrial fibrillation (HCC) 01/07/2016   Nonischemic cardiomyopathy (HCC) 12/24/2015   Diabetes mellitus (HCC) 12/18/2015    Social Hx   Social History   Socioeconomic History   Marital status: Married    Spouse name: Not on file   Number of children: Not on file   Years of education: Not on file   Highest education level: Not on file  Occupational History   Not on file  Tobacco Use   Smoking status: Former    Current packs/day: 0.00    Average packs/day: 1 pack/day for 40.0 years (40.0 ttl pk-yrs)    Types: Cigarettes    Start date: 12/18/1967    Quit date: 12/18/2007    Years since quitting: 15.7   Smokeless tobacco: Never   Tobacco  comments:    Quit for 8 years  Vaping Use   Vaping status: Never Used  Substance and Sexual Activity   Alcohol use: No    Alcohol/week: 0.0 standard drinks of alcohol   Drug use: No   Sexual activity: Yes  Other Topics Concern   Not on file  Social History Narrative   Not on file   Social Drivers of Health   Financial Resource Strain: Low Risk  (02/09/2023)   Overall Financial Resource Strain (CARDIA)    Difficulty of Paying Living Expenses: Not hard at all  Food Insecurity: No Food Insecurity (02/09/2023)   Hunger Vital Sign    Worried About Running Out of Food in the Last Year: Never true    Ran Out of Food in the Last Year: Never true  Transportation Needs: No Transportation Needs (02/09/2023)   PRAPARE - Administrator, Civil Service (Medical): No    Lack of Transportation (Non-Medical): No  Physical Activity: Insufficiently Active (01/24/2022)   Exercise Vital Sign    Days of Exercise per Week: 5 days    Minutes of Exercise per Session: 20 min  Stress: No Stress Concern Present (02/09/2023)   [employer] of Occupational Health - Occupational Stress Questionnaire    Feeling of Stress : Not at all  Social Connections: Socially Integrated (02/09/2023)   Social Connection  and Isolation Panel [NHANES]    Frequency of Communication with Friends and Family: More than three times a week    Frequency of Social Gatherings with Friends and Family: More than three times a week    Attends Religious Services: More than 4 times per year    Active Member of Clubs or Organizations: Yes    Attends Engineer, structural: More than 4 times per year    Marital Status: Married    Review of Systems Per HPI  Objective:  BP 126/68   Ht 5\' 3"  (1.6 m)   Wt 172 lb 3.2 oz (78.1 kg)   BMI 30.50 kg/m      09/18/2023    2:02 PM 09/10/2023    3:17 PM 09/03/2023    1:15 PM  BP/Weight  Systolic BP 126 142 134  Diastolic BP 68 78 64  Wt. (Lbs) 172.2 174 174  BMI 30.5  kg/m2 30.82 kg/m2 30.82 kg/m2    Physical Exam Constitutional:      General: She is not in acute distress.    Appearance: Normal appearance.  HENT:     Head: Normocephalic and atraumatic.  Cardiovascular:     Rate and Rhythm: Normal rate and regular rhythm.  Pulmonary:     Effort: Pulmonary effort is normal.     Breath sounds: Normal breath sounds. No wheezing, rhonchi or rales.  Abdominal:     General: There is no distension.     Palpations: Abdomen is soft.     Tenderness: There is no abdominal tenderness.  Skin:    Comments: Low back with small area of eschar.  Minimal surrounding erythema.  No drainage.  Neurological:     Mental Status: She is alert.  Psychiatric:        Mood and Affect: Mood normal.        Behavior: Behavior normal.     Lab Results  Component Value Date   WBC 9.5 08/13/2023   HGB 12.7 08/13/2023   HCT 40.1 08/13/2023   PLT 209 08/13/2023   GLUCOSE 152 (H) 08/13/2023   CHOL 241 (H) 12/14/2022   TRIG 245 (H) 12/14/2022   HDL 55 12/14/2022   LDLCALC 142 (H) 12/14/2022   ALT 19 08/13/2023   AST 20 08/13/2023   NA 130 (L) 08/13/2023   K 4.2 08/13/2023   CL 97 (L) 08/13/2023   CREATININE 1.23 (H) 08/13/2023   BUN 30 (H) 08/13/2023   CO2 24 08/13/2023   TSH 3.460 11/20/2019   INR 1.2 10/13/2021   HGBA1C 7.0 (H) 12/14/2022     Assessment & Plan:  Right flank pain Assessment & Plan: Benign exam.  KUB and x-ray lumbar spine for further evaluation.  Orders: -     DG Abd 1 View -     DG Lumbar Spine Complete -     POCT urinalysis dipstick  Type 2 diabetes mellitus with stage 3b chronic kidney disease, without long-term current use of insulin (HCC) Assessment & Plan: A1c today to assess.  Decreasing dosage of Janumet due to renal function.  Orders: -     Hemoglobin A1c  Hyperlipidemia, unspecified hyperlipidemia type -     Lipid panel  Essential hypertension Assessment & Plan: Stable.  Continue current medications.   Chronic  obstructive pulmonary disease, unspecified COPD type (HCC) Assessment & Plan: Stable.  Trelegy refilled.   Healing wound Assessment & Plan: Mupirocin as directed.  Supportive care.   Other orders -  Mupirocin; Apply 1 Application topically 2 (two) times daily for 7 days.  Dispense: 22 g; Refill: 0 -     Janumet; Take 1 tablet by mouth daily. Decrease in dose due to renal function.  Dispense: 90 tablet; Refill: 1    Follow-up:  Return in about 3 months (around 12/16/2023) for Follow up Chronic medical issues.  Everlene Other DO [facility]

## 2023-09-19 NOTE — Assessment & Plan Note (Signed)
 Stable.  Trelegy refilled.

## 2023-09-19 NOTE — Assessment & Plan Note (Signed)
 Mupirocin as directed.  Supportive care.

## 2023-09-20 ENCOUNTER — Ambulatory Visit (HOSPITAL_COMMUNITY)
Admission: RE | Admit: 2023-09-20 | Discharge: 2023-09-20 | Disposition: A | Discharge status: Home / Self Care | Payer: Medicare Other | Source: Ambulatory Visit | Attending: Family Medicine | Admitting: Family Medicine

## 2023-09-20 ENCOUNTER — Encounter: Payer: Self-pay | Admitting: Family Medicine

## 2023-09-20 DIAGNOSIS — R109 Unspecified abdominal pain: Secondary | ICD-10-CM | POA: Diagnosis not present

## 2023-09-20 DIAGNOSIS — I700 Atherosclerosis of aorta: Secondary | ICD-10-CM | POA: Diagnosis not present

## 2023-09-20 DIAGNOSIS — K219 Gastro-esophageal reflux disease without esophagitis: Secondary | ICD-10-CM | POA: Diagnosis not present

## 2023-09-21 ENCOUNTER — Other Ambulatory Visit: Payer: Self-pay

## 2023-09-21 DIAGNOSIS — R109 Unspecified abdominal pain: Secondary | ICD-10-CM

## 2023-09-23 ENCOUNTER — Encounter: Payer: Self-pay | Admitting: Family Medicine

## 2023-09-24 ENCOUNTER — Other Ambulatory Visit: Payer: Self-pay | Admitting: Family Medicine

## 2023-09-24 DIAGNOSIS — M79674 Pain in right toe(s): Secondary | ICD-10-CM | POA: Diagnosis not present

## 2023-09-24 DIAGNOSIS — I739 Peripheral vascular disease, unspecified: Secondary | ICD-10-CM | POA: Diagnosis not present

## 2023-09-24 DIAGNOSIS — L110 Acquired keratosis follicularis: Secondary | ICD-10-CM | POA: Diagnosis not present

## 2023-09-24 DIAGNOSIS — M79675 Pain in left toe(s): Secondary | ICD-10-CM | POA: Diagnosis not present

## 2023-09-24 DIAGNOSIS — M79672 Pain in left foot: Secondary | ICD-10-CM | POA: Diagnosis not present

## 2023-09-24 DIAGNOSIS — M79671 Pain in right foot: Secondary | ICD-10-CM | POA: Diagnosis not present

## 2023-09-24 DIAGNOSIS — E1140 Type 2 diabetes mellitus with diabetic neuropathy, unspecified: Secondary | ICD-10-CM | POA: Diagnosis not present

## 2023-09-24 MED ORDER — VALACYCLOVIR HCL 1 G PO TABS: 1000.0000 mg | ORAL_TABLET | Freq: Two times a day (BID) | ORAL | 0 refills | Status: AC

## 2023-09-27 DIAGNOSIS — E1122 Type 2 diabetes mellitus with diabetic chronic kidney disease: Secondary | ICD-10-CM | POA: Diagnosis not present

## 2023-09-27 DIAGNOSIS — E785 Hyperlipidemia, unspecified: Secondary | ICD-10-CM | POA: Diagnosis not present

## 2023-09-28 LAB — LIPID PANEL
Chol/HDL Ratio: 4.9 ratio — ABNORMAL HIGH (ref 0.0–4.4)
Cholesterol, Total: 254 mg/dL — ABNORMAL HIGH (ref 100–199)
HDL: 52 mg/dL (ref >39)
LDL Chol Calc (NIH): 149 mg/dL — ABNORMAL HIGH (ref 0–99)
Triglycerides: 289 mg/dL — ABNORMAL HIGH (ref 0–149)
VLDL Cholesterol Cal: 53 mg/dL — ABNORMAL HIGH (ref 5–40)

## 2023-09-28 LAB — HEMOGLOBIN A1C
Est. average glucose Bld gHb Est-mCnc: 163 mg/dL
Hgb A1c MFr Bld: 7.3 % — ABNORMAL HIGH (ref 4.8–5.6)

## 2023-09-30 ENCOUNTER — Encounter: Payer: Self-pay | Admitting: Family Medicine

## 2023-10-03 ENCOUNTER — Ambulatory Visit (HOSPITAL_COMMUNITY): Payer: Medicare Other

## 2023-10-10 ENCOUNTER — Ambulatory Visit (HOSPITAL_COMMUNITY)
Admission: RE | Admit: 2023-10-10 | Discharge: 2023-10-10 | Disposition: A | Discharge status: Home / Self Care | Payer: Medicare Other | Source: Ambulatory Visit | Attending: Family Medicine | Admitting: Family Medicine

## 2023-10-10 DIAGNOSIS — N200 Calculus of kidney: Secondary | ICD-10-CM | POA: Diagnosis not present

## 2023-10-10 DIAGNOSIS — I714 Abdominal aortic aneurysm, without rupture, unspecified: Secondary | ICD-10-CM | POA: Diagnosis not present

## 2023-10-10 DIAGNOSIS — R109 Unspecified abdominal pain: Secondary | ICD-10-CM | POA: Diagnosis not present

## 2023-10-10 DIAGNOSIS — K5730 Diverticulosis of large intestine without perforation or abscess without bleeding: Secondary | ICD-10-CM | POA: Diagnosis not present

## 2023-10-28 ENCOUNTER — Encounter: Payer: Self-pay | Admitting: Family Medicine

## 2023-11-13 ENCOUNTER — Other Ambulatory Visit: Payer: Self-pay | Admitting: Physical Medicine and Rehabilitation

## 2023-11-14 ENCOUNTER — Other Ambulatory Visit: Payer: Self-pay | Admitting: Family Medicine

## 2023-11-29 ENCOUNTER — Other Ambulatory Visit: Payer: Self-pay | Admitting: Family Medicine

## 2023-11-30 ENCOUNTER — Other Ambulatory Visit: Payer: Self-pay

## 2023-11-30 MED ORDER — ALBUTEROL SULFATE HFA 108 (90 BASE) MCG/ACT IN AERS: 2.0000 | INHALATION_SPRAY | Freq: Four times a day (QID) | RESPIRATORY_TRACT | 3 refills | Status: DC | PRN

## 2023-12-18 ENCOUNTER — Ambulatory Visit (INDEPENDENT_AMBULATORY_CARE_PROVIDER_SITE_OTHER): Payer: Medicare Other | Admitting: Family Medicine

## 2023-12-18 ENCOUNTER — Encounter: Payer: Self-pay | Admitting: Family Medicine

## 2023-12-18 VITALS — BP 118/68 | HR 77 | Temp 98.3°F | Ht 63.0 in | Wt 171.0 lb

## 2023-12-18 DIAGNOSIS — E1122 Type 2 diabetes mellitus with diabetic chronic kidney disease: Secondary | ICD-10-CM

## 2023-12-18 DIAGNOSIS — E785 Hyperlipidemia, unspecified: Secondary | ICD-10-CM | POA: Diagnosis not present

## 2023-12-18 DIAGNOSIS — I10 Essential (primary) hypertension: Secondary | ICD-10-CM

## 2023-12-18 DIAGNOSIS — J449 Chronic obstructive pulmonary disease, unspecified: Secondary | ICD-10-CM | POA: Diagnosis not present

## 2023-12-18 DIAGNOSIS — N1832 Chronic kidney disease, stage 3b: Secondary | ICD-10-CM

## 2023-12-18 DIAGNOSIS — E211 Secondary hyperparathyroidism, not elsewhere classified: Secondary | ICD-10-CM | POA: Diagnosis not present

## 2023-12-18 DIAGNOSIS — E559 Vitamin D deficiency, unspecified: Secondary | ICD-10-CM | POA: Diagnosis not present

## 2023-12-18 DIAGNOSIS — D631 Anemia in chronic kidney disease: Secondary | ICD-10-CM | POA: Diagnosis not present

## 2023-12-18 DIAGNOSIS — R809 Proteinuria, unspecified: Secondary | ICD-10-CM | POA: Diagnosis not present

## 2023-12-18 DIAGNOSIS — N189 Chronic kidney disease, unspecified: Secondary | ICD-10-CM | POA: Diagnosis not present

## 2023-12-18 NOTE — Patient Instructions (Signed)
You're doing well    Follow-up in 6 months

## 2023-12-19 MED ORDER — SPIRONOLACTONE 25 MG PO TABS: ORAL_TABLET | ORAL | 6 refills | Status: AC

## 2023-12-19 MED ORDER — TRELEGY ELLIPTA 100-62.5-25 MCG/ACT IN AEPB: INHALATION_SPRAY | RESPIRATORY_TRACT | 11 refills | Status: AC

## 2023-12-19 NOTE — Progress Notes (Signed)
 Subjective:  Patient ID: Leslie Kim, female    DOB: 02-04-1936  Age: 88 y.o. MRN: [id number]  CC:   Chief Complaint  Patient presents with   Follow-up    3 month f/u  Hypertension, hyperlipidemia COPD, DM     HPI:  88 year old female presents for follow-up.  Patient's blood pressure is well-controlled.  COPD stable.  A1c at goal given advanced age.  Most recent A1c 7.3.  Patient's lipids not well-controlled.  Patient declines treatment.  Patient Active Problem List   Diagnosis Date Noted   MGUS (monoclonal gammopathy of unknown significance) 07/02/2023   Iron deficiency 07/02/2023   Hepatic steatosis 09/15/2022   History of stroke 12/09/2021   Hyperlipidemia 12/09/2021   Essential hypertension    Anemia in chronic kidney disease 12/12/2019   Chronic kidney disease 12/12/2019   Chronic obstructive pulmonary disease (HCC) 10/21/2019   Gastroesophageal reflux disease without esophagitis 10/21/2019   Atrial fibrillation (HCC) 01/07/2016   Nonischemic cardiomyopathy (HCC) 12/24/2015   Diabetes mellitus (HCC) 12/18/2015    Social Hx   Social History   Socioeconomic History   Marital status: Widowed    Spouse name: Not on file   Number of children: Not on file   Years of education: Not on file   Highest education level: Not on file  Occupational History   Not on file  Tobacco Use   Smoking status: Former    Current packs/day: 0.00    Average packs/day: 1 pack/day for 40.0 years (40.0 ttl pk-yrs)    Types: Cigarettes    Start date: 12/18/1967    Quit date: 12/18/2007    Years since quitting: 16.0   Smokeless tobacco: Never   Tobacco comments:    Quit for 8 years  Vaping Use   Vaping status: Never Used  Substance and Sexual Activity   Alcohol use: No    Alcohol/week: 0.0 standard drinks of alcohol   Drug use: No   Sexual activity: Yes  Other Topics Concern   Not on file  Social History Narrative   Not on file   Social Drivers of Health    Financial Resource Strain: Low Risk  (02/09/2023)   Overall Financial Resource Strain (CARDIA)    Difficulty of Paying Living Expenses: Not hard at all  Food Insecurity: No Food Insecurity (02/09/2023)   Hunger Vital Sign    Worried About Running Out of Food in the Last Year: Never true    Ran Out of Food in the Last Year: Never true  Transportation Needs: No Transportation Needs (02/09/2023)   PRAPARE - Administrator, Civil Service (Medical): No    Lack of Transportation (Non-Medical): No  Physical Activity: Insufficiently Active (01/24/2022)   Exercise Vital Sign    Days of Exercise per Week: 5 days    Minutes of Exercise per Session: 20 min  Stress: No Stress Concern Present (02/09/2023)   [employer] of Occupational Health - Occupational Stress Questionnaire    Feeling of Stress : Not at all  Social Connections: Socially Integrated (02/09/2023)   Social Connection and Isolation Panel [NHANES]    Frequency of Communication with Friends and Family: More than three times a week    Frequency of Social Gatherings with Friends and Family: More than three times a week    Attends Religious Services: More than 4 times per year    Active Member of [employer] or Organizations: Yes    Attends Banker Meetings: More than 4  times per year    Marital Status: Married    Review of Systems Per HPI  Objective:  BP 118/68   Pulse 77   Temp 98.3 F (36.8 C)   Ht 5\' 3"  (1.6 m)   Wt 171 lb (77.6 kg)   SpO2 95%   BMI 30.29 kg/m      12/18/2023    2:08 PM 09/18/2023    2:02 PM 09/10/2023    3:17 PM  BP/Weight  Systolic BP 118 126 142  Diastolic BP 68 68 78  Wt. (Lbs) 171 172.2 174  BMI 30.29 kg/m2 30.5 kg/m2 30.82 kg/m2    Physical Exam Vitals and nursing note reviewed.  Constitutional:      General: She is not in acute distress.    Appearance: Normal appearance.  HENT:     Head: Normocephalic and atraumatic.  Eyes:     General:        Right eye: No  discharge.        Left eye: No discharge.     Conjunctiva/sclera: Conjunctivae normal.  Pulmonary:     Effort: Pulmonary effort is normal.     Breath sounds: Normal breath sounds. No wheezing, rhonchi or rales.  Neurological:     Mental Status: She is alert.  Psychiatric:        Mood and Affect: Mood normal.        Behavior: Behavior normal.     Lab Results  Component Value Date   WBC 9.5 08/13/2023   HGB 12.7 08/13/2023   HCT 40.1 08/13/2023   PLT 209 08/13/2023   GLUCOSE 152 (H) 08/13/2023   CHOL 254 (H) 09/27/2023   TRIG 289 (H) 09/27/2023   HDL 52 09/27/2023   LDLCALC 149 (H) 09/27/2023   ALT 19 08/13/2023   AST 20 08/13/2023   NA 130 (L) 08/13/2023   K 4.2 08/13/2023   CL 97 (L) 08/13/2023   CREATININE 1.23 (H) 08/13/2023   BUN 30 (H) 08/13/2023   CO2 24 08/13/2023   TSH 3.460 11/20/2019   INR 1.2 10/13/2021   HGBA1C 7.3 (H) 09/27/2023     Assessment & Plan:  Essential hypertension Assessment & Plan: Stable.  Continue current medications.  Spironolactone  refilled.   Chronic obstructive pulmonary disease, unspecified COPD type (HCC) Assessment & Plan: Stable.  Continue Trelegy and albuterol .  Trelegy refilled.   Type 2 diabetes mellitus with stage 3b chronic kidney disease, without long-term current use of insulin  (HCC) Assessment & Plan: Stable on Janumet .  Continue.   Hyperlipidemia, unspecified hyperlipidemia type Assessment & Plan: Not at goal.  Declines treatment.   Other orders -     Trelegy Ellipta ; INHALE 1 PUFF INTO THE LUNGS DAILY IN THE AFTERNOON  Dispense: 60 each; Refill: 11 -     Spironolactone ; TAKE 1/2 TABLET(12.5 MG) BY MOUTH EVERY DAY  Dispense: 45 tablet; Refill: 6    Follow-up:  6 months  Alleyah Twombly Debrah Fan DO [facility]

## 2023-12-19 NOTE — Assessment & Plan Note (Signed)
 Stable.  Continue current medications.  Spironolactone  refilled.

## 2023-12-19 NOTE — Assessment & Plan Note (Signed)
 Not at goal.  Declines treatment.

## 2023-12-19 NOTE — Assessment & Plan Note (Signed)
 Stable.  Continue Trelegy and albuterol .  Trelegy refilled.

## 2023-12-19 NOTE — Assessment & Plan Note (Signed)
 Stable on Janumet .  Continue.

## 2023-12-25 DIAGNOSIS — M79672 Pain in left foot: Secondary | ICD-10-CM | POA: Diagnosis not present

## 2023-12-25 DIAGNOSIS — L110 Acquired keratosis follicularis: Secondary | ICD-10-CM | POA: Diagnosis not present

## 2023-12-25 DIAGNOSIS — M79671 Pain in right foot: Secondary | ICD-10-CM | POA: Diagnosis not present

## 2023-12-25 DIAGNOSIS — I739 Peripheral vascular disease, unspecified: Secondary | ICD-10-CM | POA: Diagnosis not present

## 2023-12-25 DIAGNOSIS — E1140 Type 2 diabetes mellitus with diabetic neuropathy, unspecified: Secondary | ICD-10-CM | POA: Diagnosis not present

## 2023-12-27 DIAGNOSIS — R809 Proteinuria, unspecified: Secondary | ICD-10-CM | POA: Diagnosis not present

## 2023-12-27 DIAGNOSIS — E1122 Type 2 diabetes mellitus with diabetic chronic kidney disease: Secondary | ICD-10-CM | POA: Diagnosis not present

## 2023-12-27 DIAGNOSIS — I129 Hypertensive chronic kidney disease with stage 1 through stage 4 chronic kidney disease, or unspecified chronic kidney disease: Secondary | ICD-10-CM | POA: Diagnosis not present

## 2023-12-27 DIAGNOSIS — N1832 Chronic kidney disease, stage 3b: Secondary | ICD-10-CM | POA: Diagnosis not present

## 2023-12-31 ENCOUNTER — Other Ambulatory Visit (HOSPITAL_COMMUNITY): Payer: Self-pay | Admitting: Nephrology

## 2023-12-31 ENCOUNTER — Other Ambulatory Visit: Payer: Self-pay | Admitting: Nurse Practitioner

## 2023-12-31 ENCOUNTER — Ambulatory Visit (HOSPITAL_COMMUNITY)
Admission: RE | Admit: 2023-12-31 | Discharge: 2023-12-31 | Disposition: A | Discharge status: Home / Self Care | Source: Ambulatory Visit | Attending: Nephrology | Admitting: Nephrology

## 2023-12-31 DIAGNOSIS — R059 Cough, unspecified: Secondary | ICD-10-CM | POA: Diagnosis not present

## 2024-01-02 IMAGING — US US ABDOMEN LIMITED
1 series · 14 of 25 positions shown · non-contrast
Comparison: None Available.

CLINICAL DATA: Elevated LFTs

EXAM:
ULTRASOUND ABDOMEN LIMITED RIGHT UPPER QUADRANT

[Series 1: us abdomen limited ruq (liver/gb) · 14 of 51 slices shown]
[im 1/51]
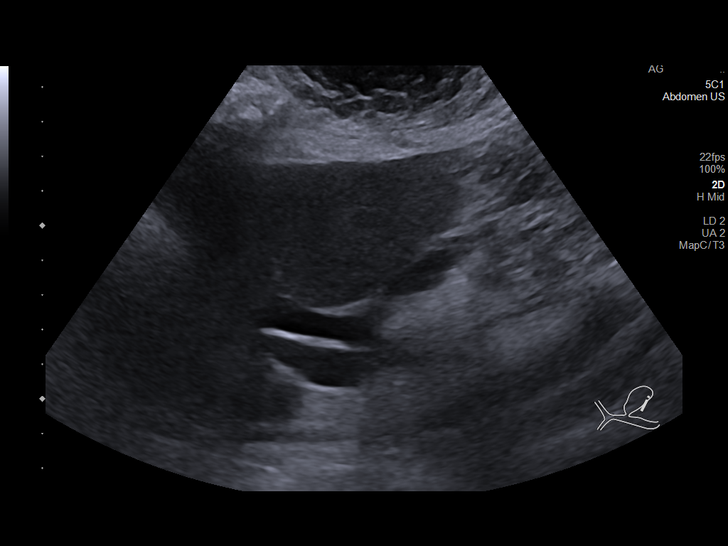
[im 5/51]
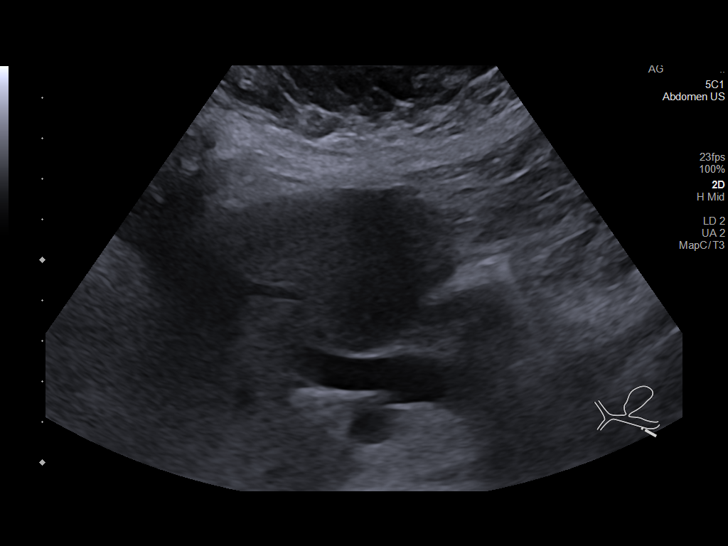
[im 9/51]
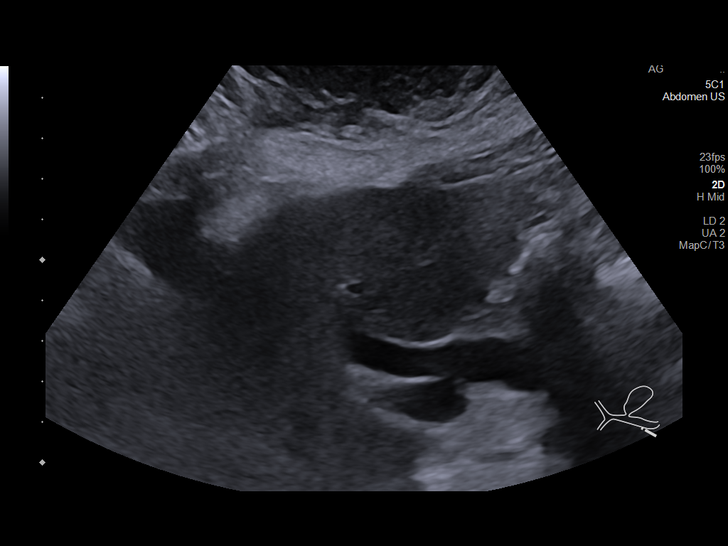
[im 13/51]
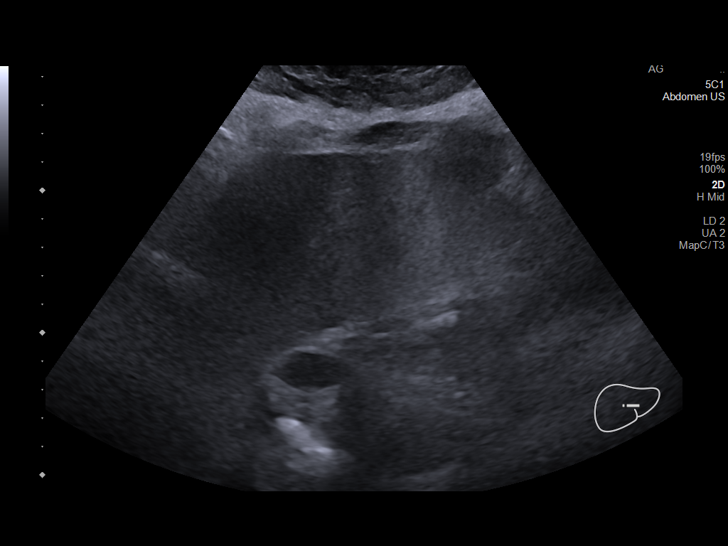
[im 17/51]
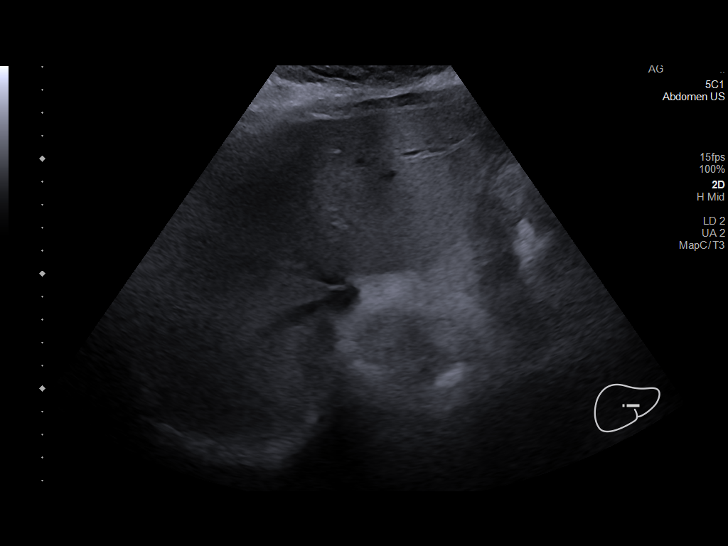
[im 19/51]
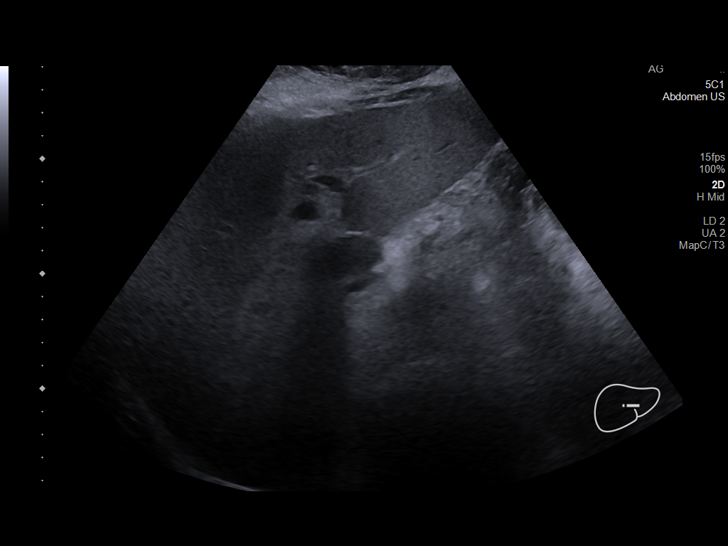
[im 23/51]
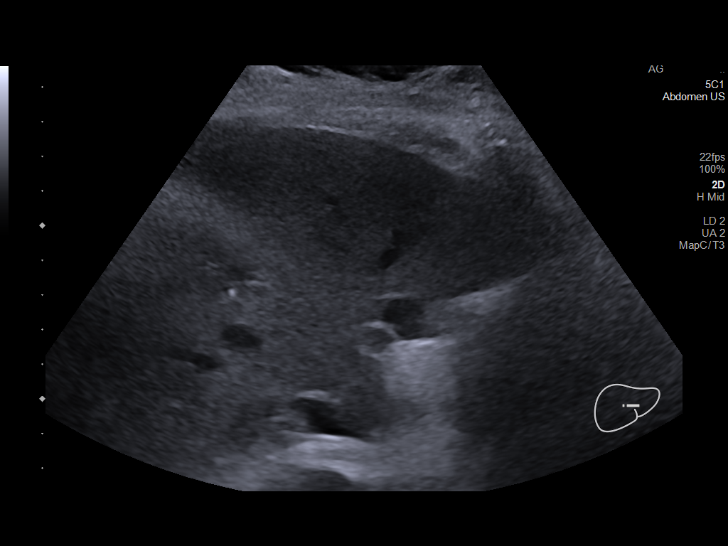
[im 28/51]
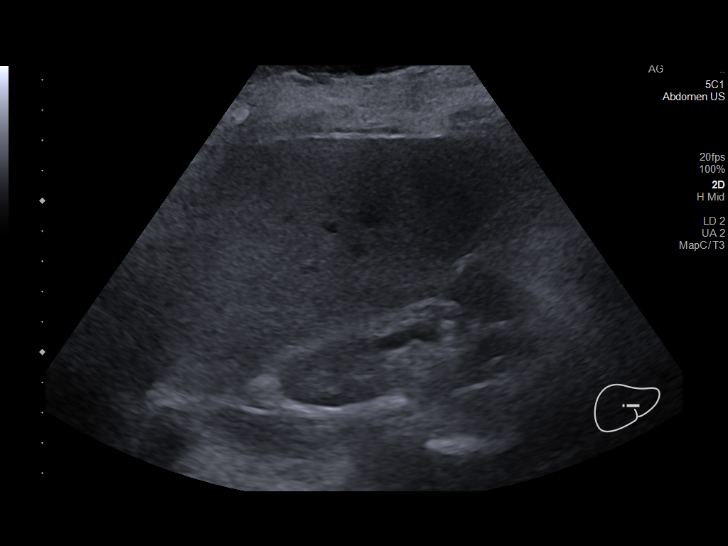
[im 32/51]
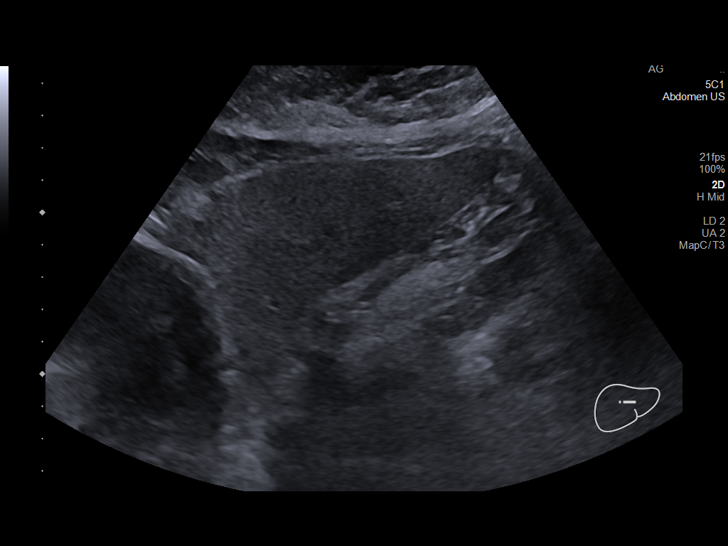
[im 34/51]
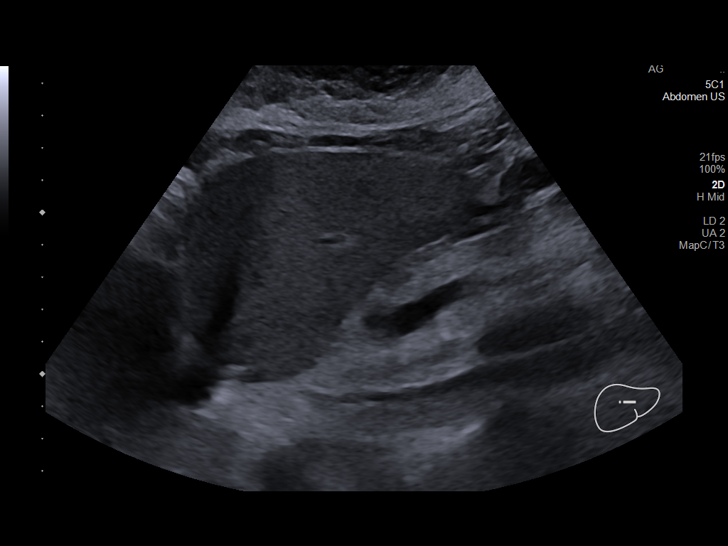
[im 38/51]
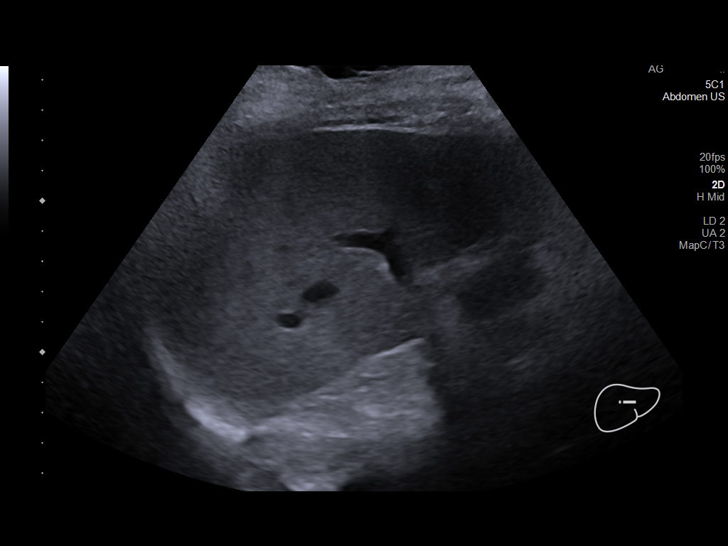
[im 42/51]
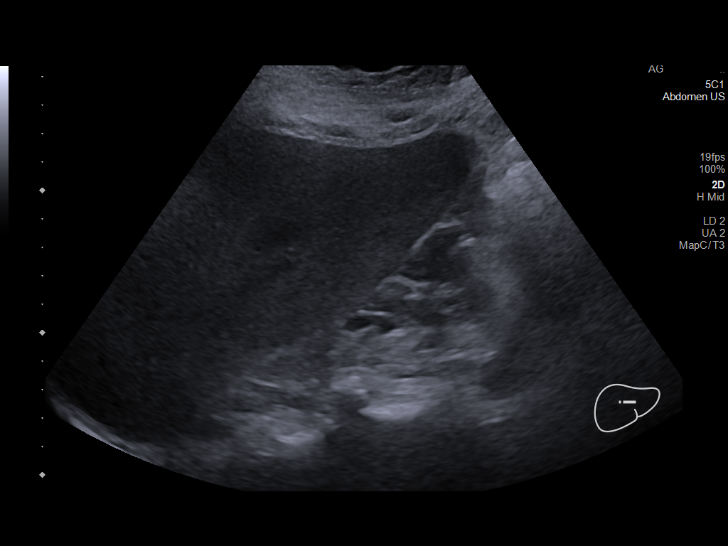
[im 46/51]
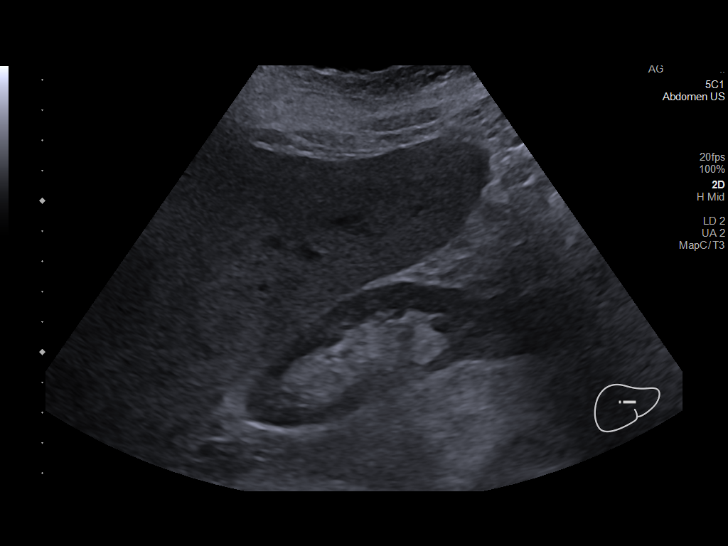
[im 51/51]
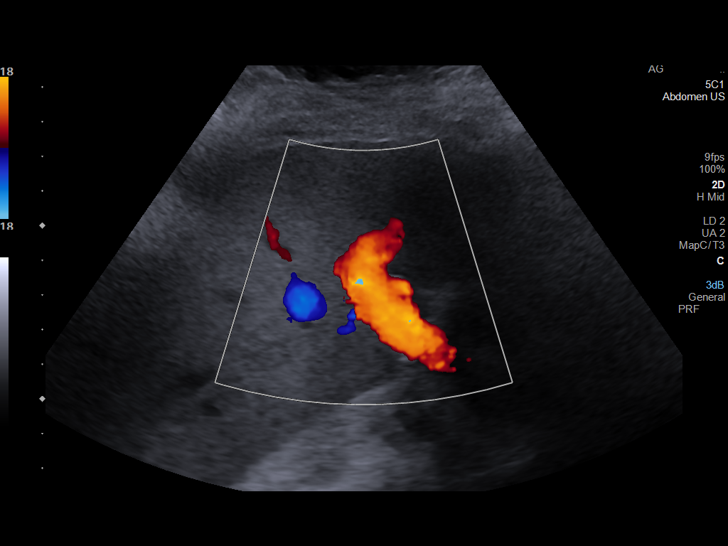

[14 of 25 positions shown; findings below may reference images not displayed]

FINDINGS: Gallbladder:

Surgically absent.

Common bile duct:

Diameter: 12 mm

Liver:

Mildly increased echogenicity of the parenchyma with no focal mass
identified. Portal vein is patent on color Doppler imaging with
normal direction of blood flow towards the liver.

Other: None.
IMPRESSION: 1. Dilated common bile duct which could be compensatory from
cholecystectomy, however correlate clinically and consider MRCP.
2. Mildly increased echogenicity of the liver parenchyma which may
represent hepatic steatosis and/or other hepatocellular disease.

## 2024-01-07 DIAGNOSIS — I5042 Chronic combined systolic (congestive) and diastolic (congestive) heart failure: Secondary | ICD-10-CM | POA: Diagnosis not present

## 2024-01-07 DIAGNOSIS — E611 Iron deficiency: Secondary | ICD-10-CM | POA: Diagnosis not present

## 2024-01-07 DIAGNOSIS — N1832 Chronic kidney disease, stage 3b: Secondary | ICD-10-CM | POA: Diagnosis not present

## 2024-01-07 DIAGNOSIS — R803 Bence Jones proteinuria: Secondary | ICD-10-CM | POA: Diagnosis not present

## 2024-01-21 ENCOUNTER — Encounter: Payer: Self-pay | Admitting: Family Medicine

## 2024-01-21 ENCOUNTER — Other Ambulatory Visit: Payer: Self-pay | Admitting: Family Medicine

## 2024-01-21 MED ORDER — ALBUTEROL SULFATE HFA 108 (90 BASE) MCG/ACT IN AERS: 2.0000 | INHALATION_SPRAY | Freq: Four times a day (QID) | RESPIRATORY_TRACT | 3 refills | Status: AC | PRN

## 2024-02-15 ENCOUNTER — Other Ambulatory Visit: Payer: Self-pay | Admitting: Family Medicine

## 2024-02-15 ENCOUNTER — Ambulatory Visit: Payer: Medicare Other

## 2024-02-15 DIAGNOSIS — N1832 Chronic kidney disease, stage 3b: Secondary | ICD-10-CM

## 2024-03-03 ENCOUNTER — Inpatient Hospital Stay: Payer: Medicare Other | Attending: Oncology

## 2024-03-03 DIAGNOSIS — D509 Iron deficiency anemia, unspecified: Secondary | ICD-10-CM | POA: Diagnosis not present

## 2024-03-03 DIAGNOSIS — N189 Chronic kidney disease, unspecified: Secondary | ICD-10-CM | POA: Diagnosis not present

## 2024-03-03 DIAGNOSIS — Z7984 Long term (current) use of oral hypoglycemic drugs: Secondary | ICD-10-CM | POA: Insufficient documentation

## 2024-03-03 DIAGNOSIS — Z79899 Other long term (current) drug therapy: Secondary | ICD-10-CM | POA: Insufficient documentation

## 2024-03-03 DIAGNOSIS — D472 Monoclonal gammopathy: Secondary | ICD-10-CM | POA: Insufficient documentation

## 2024-03-03 DIAGNOSIS — D508 Other iron deficiency anemias: Secondary | ICD-10-CM

## 2024-03-03 DIAGNOSIS — Z7901 Long term (current) use of anticoagulants: Secondary | ICD-10-CM | POA: Insufficient documentation

## 2024-03-03 DIAGNOSIS — Z79624 Long term (current) use of inhibitors of nucleotide synthesis: Secondary | ICD-10-CM | POA: Insufficient documentation

## 2024-03-03 DIAGNOSIS — E538 Deficiency of other specified B group vitamins: Secondary | ICD-10-CM | POA: Diagnosis present

## 2024-03-03 LAB — COMPREHENSIVE METABOLIC PANEL WITH GFR
ALT: 17 U/L (ref 0–44)
AST: 16 U/L (ref 15–41)
Albumin: 4 g/dL (ref 3.5–5.0)
Alkaline Phosphatase: 63 U/L (ref 38–126)
Anion gap: 12 (ref 5–15)
BUN: 33 mg/dL — ABNORMAL HIGH (ref 8–23)
CO2: 22 mmol/L (ref 22–32)
Calcium: 9.3 mg/dL (ref 8.9–10.3)
Chloride: 100 mmol/L (ref 98–111)
Creatinine, Ser: 1.31 mg/dL — ABNORMAL HIGH (ref 0.44–1.00)
GFR, Estimated: 40 mL/min — ABNORMAL LOW (ref >60)
Glucose, Bld: 141 mg/dL — ABNORMAL HIGH (ref 70–99)
Potassium: 4.2 mmol/L (ref 3.5–5.1)
Sodium: 134 mmol/L — ABNORMAL LOW (ref 135–145)
Total Bilirubin: 0.4 mg/dL (ref 0.0–1.2)
Total Protein: 8.2 g/dL — ABNORMAL HIGH (ref 6.5–8.1)

## 2024-03-03 LAB — CBC WITH DIFFERENTIAL/PLATELET
Abs Immature Granulocytes: 0.01 K/uL (ref 0.00–0.07)
Basophils Absolute: 0 K/uL (ref 0.0–0.1)
Basophils Relative: 0 %
Eosinophils Absolute: 0.1 K/uL (ref 0.0–0.5)
Eosinophils Relative: 1 %
HCT: 38.7 % (ref 36.0–46.0)
Hemoglobin: 12.2 g/dL (ref 12.0–15.0)
Immature Granulocytes: 0 %
Lymphocytes Relative: 27 %
Lymphs Abs: 2.4 K/uL (ref 0.7–4.0)
MCH: 29 pg (ref 26.0–34.0)
MCHC: 31.5 g/dL (ref 30.0–36.0)
MCV: 92.1 fL (ref 80.0–100.0)
Monocytes Absolute: 0.7 K/uL (ref 0.1–1.0)
Monocytes Relative: 8 %
Neutro Abs: 5.5 K/uL (ref 1.7–7.7)
Neutrophils Relative %: 64 %
Platelets: 191 K/uL (ref 150–400)
RBC: 4.2 MIL/uL (ref 3.87–5.11)
RDW: 13.2 % (ref 11.5–15.5)
WBC: 8.8 K/uL (ref 4.0–10.5)
nRBC: 0 % (ref 0.0–0.2)

## 2024-03-03 LAB — IRON AND TIBC
Iron: 68 ug/dL (ref 28–170)
Saturation Ratios: 21 % (ref 10.4–31.8)
TIBC: 332 ug/dL (ref 250–450)
UIBC: 264 ug/dL

## 2024-03-03 LAB — VITAMIN B12: Vitamin B-12: 906 pg/mL (ref 180–914)

## 2024-03-03 LAB — FERRITIN: Ferritin: 130 ng/mL (ref 11–307)

## 2024-03-03 LAB — FOLATE: Folate: >40 ng/mL (ref >5.9)

## 2024-03-04 DIAGNOSIS — I739 Peripheral vascular disease, unspecified: Secondary | ICD-10-CM | POA: Diagnosis not present

## 2024-03-04 DIAGNOSIS — M79674 Pain in right toe(s): Secondary | ICD-10-CM | POA: Diagnosis not present

## 2024-03-04 DIAGNOSIS — M79672 Pain in left foot: Secondary | ICD-10-CM | POA: Diagnosis not present

## 2024-03-04 DIAGNOSIS — L110 Acquired keratosis follicularis: Secondary | ICD-10-CM | POA: Diagnosis not present

## 2024-03-04 DIAGNOSIS — M79675 Pain in left toe(s): Secondary | ICD-10-CM | POA: Diagnosis not present

## 2024-03-04 DIAGNOSIS — M79671 Pain in right foot: Secondary | ICD-10-CM | POA: Diagnosis not present

## 2024-03-04 DIAGNOSIS — E1140 Type 2 diabetes mellitus with diabetic neuropathy, unspecified: Secondary | ICD-10-CM | POA: Diagnosis not present

## 2024-03-04 LAB — KAPPA/LAMBDA LIGHT CHAINS
Kappa free light chain: 58.6 mg/L — ABNORMAL HIGH (ref 3.3–19.4)
Kappa, lambda light chain ratio: 1.05 (ref 0.26–1.65)
Lambda free light chains: 55.9 mg/L — ABNORMAL HIGH (ref 5.7–26.3)

## 2024-03-06 ENCOUNTER — Other Ambulatory Visit: Payer: Self-pay | Admitting: Family Medicine

## 2024-03-06 LAB — MULTIPLE MYELOMA PANEL, SERUM
Albumin SerPl Elph-Mcnc: 3.5 g/dL (ref 2.9–4.4)
Albumin/Glob SerPl: 0.9 (ref 0.7–1.7)
Alpha 1: 0.3 g/dL (ref 0.0–0.4)
Alpha2 Glob SerPl Elph-Mcnc: 1 g/dL (ref 0.4–1.0)
B-Globulin SerPl Elph-Mcnc: 1.3 g/dL (ref 0.7–1.3)
Gamma Glob SerPl Elph-Mcnc: 1.3 g/dL (ref 0.4–1.8)
Globulin, Total: 3.9 g/dL (ref 2.2–3.9)
IgA: 368 mg/dL (ref 64–422)
IgG (Immunoglobin G), Serum: 1511 mg/dL (ref 586–1602)
IgM (Immunoglobulin M), Srm: 47 mg/dL (ref 26–217)
Total Protein ELP: 7.4 g/dL (ref 6.0–8.5)

## 2024-03-10 ENCOUNTER — Inpatient Hospital Stay: Payer: Medicare Other | Admitting: Oncology

## 2024-03-10 VITALS — BP 159/60 | HR 77 | Temp 98.6°F | Resp 20 | Wt 171.4 lb

## 2024-03-10 DIAGNOSIS — N189 Chronic kidney disease, unspecified: Secondary | ICD-10-CM | POA: Diagnosis not present

## 2024-03-10 DIAGNOSIS — D508 Other iron deficiency anemias: Secondary | ICD-10-CM | POA: Diagnosis not present

## 2024-03-10 DIAGNOSIS — Z7901 Long term (current) use of anticoagulants: Secondary | ICD-10-CM | POA: Diagnosis not present

## 2024-03-10 DIAGNOSIS — E538 Deficiency of other specified B group vitamins: Secondary | ICD-10-CM | POA: Diagnosis not present

## 2024-03-10 DIAGNOSIS — Z79624 Long term (current) use of inhibitors of nucleotide synthesis: Secondary | ICD-10-CM | POA: Diagnosis not present

## 2024-03-10 DIAGNOSIS — D509 Iron deficiency anemia, unspecified: Secondary | ICD-10-CM | POA: Diagnosis not present

## 2024-03-10 DIAGNOSIS — Z7984 Long term (current) use of oral hypoglycemic drugs: Secondary | ICD-10-CM | POA: Diagnosis not present

## 2024-03-10 DIAGNOSIS — D472 Monoclonal gammopathy: Secondary | ICD-10-CM | POA: Diagnosis not present

## 2024-03-10 DIAGNOSIS — N1832 Chronic kidney disease, stage 3b: Secondary | ICD-10-CM

## 2024-03-10 DIAGNOSIS — Z79899 Other long term (current) drug therapy: Secondary | ICD-10-CM | POA: Diagnosis not present

## 2024-03-10 NOTE — Assessment & Plan Note (Addendum)
 Patient has history of chronic kidney disease and follows with Dr. Wolfgang Phoenix -Continue to follow with Dr. Wolfgang Phoenix

## 2024-03-10 NOTE — Assessment & Plan Note (Signed)
 Patient currently taking 1000 mcg of vitamin B12 daily Labs showed normal vitamin B12 at this time  - Continue taking oral vitamin B12 daily

## 2024-03-10 NOTE — Assessment & Plan Note (Signed)
 Patient likely has MGUS with an M spike of: 0.4 (1/25) Risk stratification: Non IgM, M spike<1.5, Normal FLC ratio: 1.05 CRAB criteria: Calcium : Less than 10 Creatinine: Less than 2 Hemoglobin: Greater than 10 Bone lesions: Not evaluated Serum light chains: FKLC: 58.6, FLLC: 55.9, FLC ratio: 1.05  -Patient has low risk MGUS.  No indication for PET scan at this time -Do not meet criteria for bone marrow biopsy at this time -Discussed with the patient the risk of progression to Multiple myeloma is around 1% per year and that observation is the standard of care.  - Recent labs with resolution of M spike. - Will continue to liter   RTC in 12 months for follow -up with labs.

## 2024-03-10 NOTE — Progress Notes (Signed)
 [facility] at [facility]  HEMATOLOGY FOLLOW-UP VISIT  Bluford Jacqulyn MATSU, DO  REASON FOR FOLLOW-UP: MGUs and iron deficiency  ASSESSMENT & PLAN:  Patient is an 88 year old female with chronic kidney Disease following for MGUS and iron deficiency   Assessment & Plan MGUS (monoclonal gammopathy of unknown significance) Patient likely has MGUS with an M spike of: 0.4 (1/25) Risk stratification: Non IgM, M spike<1.5, Normal FLC ratio: 1.05 CRAB criteria: Calcium : Less than 10 Creatinine: Less than 2 Hemoglobin: Greater than 10 Bone lesions: Not evaluated Serum light chains: FKLC: 58.6, FLLC: 55.9, FLC ratio: 1.05  -Patient has low risk MGUS.  No indication for PET scan at this time -Do not meet criteria for bone marrow biopsy at this time -Discussed with the patient the risk of progression to Multiple myeloma is around 1% per year and that observation is the standard of care.  - Recent labs with resolution of M spike. - Will continue to liter   RTC in 12 months for follow -up with labs. Iron deficiency anemia secondary to inadequate dietary iron intake Patient has iron deficiency with no anemia.   S/p 2 doses of IV Feraheme.   Significant improvement in iron levels.   Patient reports improved energy levels after iron supplementation.  - Can discontinue oral iron at this time. Stage 3b chronic kidney disease (HCC) Patient has history of chronic kidney disease and follows with Dr. Rachele  -Continue to follow with Dr. Rachele Vitamin B12 deficiency Patient currently taking 1000 mcg of vitamin B12 daily Labs showed normal vitamin B12 at this time  - Continue taking oral vitamin B12 daily    Orders Placed This Encounter  Procedures   CBC with Differential    Standing Status:   Future    Expiration Date:   03/10/2025   Comprehensive metabolic panel    Standing Status:   Future    Expiration Date:   03/10/2025   Iron and TIBC ([facility] ONLY)    Standing Status:   Future    Expiration Date:   03/10/2025   Ferritin    Standing Status:   Future    Expiration Date:   03/10/2025   Immunofixation electrophoresis    Standing Status:   Future    Expiration Date:   03/10/2025   Kappa/lambda light chains    Standing Status:   Future    Expiration Date:   03/10/2025   Protein electrophoresis, serum    Standing Status:   Future    Expiration Date:   03/10/2025    The total time spent in the appointment was 20 minutes encounter with patients including review of chart and various tests results, discussions about plan of care and coordination of care plan   All questions were answered. The patient knows to call the clinic with any problems, questions or concerns. No barriers to learning was detected.  Mickiel Dry, MD 8/11/20252:34 PM   SUMMARY OF HEMATOLOGIC HISTORY: MGUS: -02/07/2023: M spike: 0.4, IFE: IgG monoclonal light chain -05/08/2023: M spike: 0.4 -08/13/2023: M spike: 0.4 -03/03/2024: No M spike  Iron deficiency: -S/p IV Feraheme in 2 to doses 07/11/2023, 07/19/2023   INTERVAL HISTORY: Leslie Kim Solo 88 y.o. female following for MGUS and iron deficiency.  She is accompanied by her daughter today.    Patient has no complaints today.  She reports she has good days and bad days but fatigue is mostly stable.  I have reviewed the past medical history,  past surgical history, social history and family history with the patient   ALLERGIES:  is allergic to codeine.  MEDICATIONS:  Current Outpatient Medications  Medication Sig Dispense Refill   Accu-Chek Softclix Lancets lancets Use to check blood sugars daily before meal dx E11.22, N18.32 100 each 12   acetaminophen  (TYLENOL ) 325 MG tablet Take 2 tablets (650 mg total) by mouth every 4 (four) hours as needed for mild pain (or temp > 37.5 C (99.5 F)).     albuterol  (PROVENTIL ) (2.5 MG/3ML) 0.083% nebulizer solution Take 3 mLs (2.5 mg total) by  nebulization every 6 (six) hours as needed for wheezing or shortness of breath. 150 mL 1   albuterol  (VENTOLIN  HFA) 108 (90 Base) MCG/ACT inhaler Inhale 2 puffs into the lungs every 6 (six) hours as needed for wheezing or shortness of breath. 8 g 3   b complex vitamins tablet Take 1 tablet by mouth every other day.     carvedilol  (COREG ) 6.25 MG tablet TAKE 1 TABLET(6.25 MG) BY MOUTH TWICE DAILY WITH A MEAL 180 tablet 3   cholecalciferol  (VITAMIN D3) 25 MCG (1000 UNIT) tablet Take 2 tablets (2,000 Units total) by mouth daily. 30 tablet 0   ELIQUIS  5 MG TABS tablet TAKE 1 TABLET BY MOUTH TWICE DAILY 60 tablet 11   ferrous sulfate  324 MG TBEC Take 1 tablet (324 mg total) by mouth daily with breakfast. 30 tablet 1   Fluticasone -Umeclidin-Vilant (TRELEGY ELLIPTA ) 100-62.5-25 MCG/ACT AEPB INHALE 1 PUFF INTO THE LUNGS DAILY IN THE AFTERNOON 60 each 11   furosemide  (LASIX ) 20 MG tablet 1/2 tab daily (Patient taking differently: Take 10 mg by mouth 2 (two) times daily. 1/2 tab daily) 30 tablet 11   glucose blood (ACCU-CHEK GUIDE TEST) test strip USE TO CHECK BLOOD SUGAR DAILY 100 strip 3   losartan  (COZAAR ) 50 MG tablet Take 1.5 tablets (75 mg total) by mouth daily. Patient taking 1 1/2 tablet by mouth (75 mg Total) daily 145 tablet 3   Magnesium  250 MG TABS Take 1 tablet by mouth daily.     melatonin 3 MG TABS tablet Take 2 tablets (6 mg total) by mouth at bedtime. 60 tablet 1   pantoprazole  (PROTONIX ) 40 MG tablet TAKE 1 TABLET(40 MG) BY MOUTH DAILY 90 tablet 1   sitaGLIPtin -metformin  (JANUMET ) 50-500 MG tablet Take 1 tablet by mouth daily. Decrease in dose due to renal function. 90 tablet 1   spironolactone  (ALDACTONE ) 25 MG tablet TAKE 1/2 TABLET(12.5 MG) BY MOUTH EVERY DAY 45 tablet 6   valACYclovir  (VALTREX ) 1000 MG tablet Take 1 tablet (1,000 mg total) by mouth 2 (two) times daily. 14 tablet 0   No current facility-administered medications for this visit.     REVIEW OF SYSTEMS:   Constitutional:  Denies fevers, chills or night sweats Eyes: Denies blurriness of vision Ears, nose, mouth, throat, and face: Denies mucositis or sore throat Respiratory: Denies cough, dyspnea or wheezes Cardiovascular: Denies palpitation, chest discomfort or lower extremity swelling Gastrointestinal:  Denies nausea, heartburn or change in bowel habits Skin: Denies abnormal skin rashes Lymphatics: Denies new lymphadenopathy or easy bruising Neurological:Denies numbness, tingling or new weaknesses Behavioral/Psych: Mood is stable, no new changes  All other systems were reviewed with the patient and are negative.  PHYSICAL EXAMINATION:   Vitals:   03/10/24 1350 03/10/24 1355  BP: (!) 160/61 (!) 159/60  Pulse: 77   Resp: 20   Temp: 98.6 F (37 C)   SpO2: 95%     GENERAL:alert,  no distress and comfortable SKIN: skin color, texture, turgor are normal, no rashes or significant lesions LUNGS: clear to auscultation and percussion with normal breathing effort HEART: regular rate & rhythm and no murmurs and no lower extremity edema ABDOMEN:abdomen soft, non-tender and normal bowel sounds Musculoskeletal:no cyanosis of digits and no clubbing  NEURO: alert & oriented x 3 with fluent speech  LABORATORY DATA:  I have reviewed the data as listed  Lab Results  Component Value Date   WBC 8.8 03/03/2024   NEUTROABS 5.5 03/03/2024   HGB 12.2 03/03/2024   HCT 38.7 03/03/2024   MCV 92.1 03/03/2024   PLT 191 03/03/2024       Chemistry      Component Value Date/Time   NA 134 (L) 03/03/2024 1323   NA 133 (L) 12/14/2022 1226   K 4.2 03/03/2024 1323   CL 100 03/03/2024 1323   CO2 22 03/03/2024 1323   BUN 33 (H) 03/03/2024 1323   BUN 26 12/14/2022 1226   CREATININE 1.31 (H) 03/03/2024 1323   CREATININE 1.08 (H) 03/28/2016 1252      Component Value Date/Time   CALCIUM  9.3 03/03/2024 1323   ALKPHOS 63 03/03/2024 1323   AST 16 03/03/2024 1323   ALT 17 03/03/2024 1323   BILITOT 0.4 03/03/2024 1323    BILITOT 0.2 12/14/2022 1226      Latest Reference Range & Units 03/03/24 13:24  Iron 28 - 170 ug/dL 68  UIBC ug/dL 735  TIBC 749 - 549 ug/dL 667  Saturation Ratios 10.4 - 31.8 % 21  Ferritin 11 - 307 ng/mL 130  Folate >5.9 ng/mL >40.0    Latest Reference Range & Units 03/03/24 13:24  Vitamin B12 180 - 914 pg/mL 906  Total Protein ELP 6.0 - 8.5 g/dL 7.4 (C)  Albumin SerPl Elph-Mcnc 2.9 - 4.4 g/dL 3.5 (C)  Albumin/Glob SerPl 0.7 - 1.7  0.9 (C)  Alpha2 Glob SerPl Elph-Mcnc 0.4 - 1.0 g/dL 1.0 (C)  Alpha 1 0.0 - 0.4 g/dL 0.3 (C)  Gamma Glob SerPl Elph-Mcnc 0.4 - 1.8 g/dL 1.3 (C)  M Protein SerPl Elph-Mcnc Not Observed g/dL Not Observed (C)  IFE 1  Comment (C)  Globulin, Total 2.2 - 3.9 g/dL 3.9 (C)  B-Globulin SerPl Elph-Mcnc 0.7 - 1.3 g/dL 1.3 (C)  IgG (Immunoglobin G), Serum 586 - 1,602 mg/dL 8,488  IgM (Immunoglobulin M), Srm 26 - 217 mg/dL 47  IgA 64 - 577 mg/dL 631  (C): Corrected   Latest Reference Range & Units 03/03/24 13:23  Kappa free light chain 3.3 - 19.4 mg/L 58.6 (H)  Lambda free light chains 5.7 - 26.3 mg/L 55.9 (H)  Kappa, lambda light chain ratio 0.26 - 1.65  1.05  (H): Data is abnormally high

## 2024-03-12 ENCOUNTER — Other Ambulatory Visit: Payer: Self-pay | Admitting: Family Medicine

## 2024-04-08 ENCOUNTER — Ambulatory Visit: Admitting: Cardiology

## 2024-04-08 DIAGNOSIS — E113293 Type 2 diabetes mellitus with mild nonproliferative diabetic retinopathy without macular edema, bilateral: Secondary | ICD-10-CM | POA: Diagnosis not present

## 2024-04-08 LAB — HM DIABETES EYE EXAM

## 2024-04-14 ENCOUNTER — Telehealth: Payer: Self-pay | Admitting: Family Medicine

## 2024-04-14 MED ORDER — CARVEDILOL 6.25 MG PO TABS: 6.2500 mg | ORAL_TABLET | Freq: Two times a day (BID) | ORAL | 3 refills | Status: AC

## 2024-04-14 NOTE — Telephone Encounter (Signed)
 Refill sent.

## 2024-04-14 NOTE — Telephone Encounter (Signed)
 Refill on  Carvedilol  6.25 mg last filled 04/16/23  [facility]

## 2024-04-19 ENCOUNTER — Other Ambulatory Visit: Payer: Self-pay | Admitting: Cardiology

## 2024-04-25 ENCOUNTER — Ambulatory Visit

## 2024-05-13 DIAGNOSIS — M79675 Pain in left toe(s): Secondary | ICD-10-CM | POA: Diagnosis not present

## 2024-05-13 DIAGNOSIS — M79674 Pain in right toe(s): Secondary | ICD-10-CM | POA: Diagnosis not present

## 2024-05-13 DIAGNOSIS — L110 Acquired keratosis follicularis: Secondary | ICD-10-CM | POA: Diagnosis not present

## 2024-05-13 DIAGNOSIS — E1140 Type 2 diabetes mellitus with diabetic neuropathy, unspecified: Secondary | ICD-10-CM | POA: Diagnosis not present

## 2024-05-13 DIAGNOSIS — M79672 Pain in left foot: Secondary | ICD-10-CM | POA: Diagnosis not present

## 2024-05-13 DIAGNOSIS — I739 Peripheral vascular disease, unspecified: Secondary | ICD-10-CM | POA: Diagnosis not present

## 2024-05-13 DIAGNOSIS — M79671 Pain in right foot: Secondary | ICD-10-CM | POA: Diagnosis not present

## 2024-05-21 ENCOUNTER — Ambulatory Visit: Attending: Cardiology | Admitting: Cardiology

## 2024-05-21 ENCOUNTER — Encounter: Payer: Self-pay | Admitting: Cardiology

## 2024-05-21 VITALS — BP 132/70 | HR 76 | Ht 63.0 in | Wt 171.2 lb

## 2024-05-21 DIAGNOSIS — R0602 Shortness of breath: Secondary | ICD-10-CM

## 2024-05-21 DIAGNOSIS — I5020 Unspecified systolic (congestive) heart failure: Secondary | ICD-10-CM | POA: Diagnosis not present

## 2024-05-21 DIAGNOSIS — M791 Myalgia, unspecified site: Secondary | ICD-10-CM | POA: Diagnosis not present

## 2024-05-21 DIAGNOSIS — I480 Paroxysmal atrial fibrillation: Secondary | ICD-10-CM | POA: Diagnosis not present

## 2024-05-21 DIAGNOSIS — E782 Mixed hyperlipidemia: Secondary | ICD-10-CM

## 2024-05-21 DIAGNOSIS — T466X5A Adverse effect of antihyperlipidemic and antiarteriosclerotic drugs, initial encounter: Secondary | ICD-10-CM

## 2024-05-21 DIAGNOSIS — T466X5D Adverse effect of antihyperlipidemic and antiarteriosclerotic drugs, subsequent encounter: Secondary | ICD-10-CM

## 2024-05-21 NOTE — Progress Notes (Signed)
    Cardiology Office Note  Date: 05/21/2024   ID: Leslie Kim, DOB 1935/12/08, MRN [id number]  History of Present Illness: Leslie Kim is an 88 y.o. female last seen in February.  She is here for a follow-up visit.  Reports no exertional chest pain.  Has been somewhat more short of breath with the change of season, states that she has had some trouble with allergies.  Some degree of orthopnea as well.  No leg swelling or weight gain however.  I reviewed her medications.  She has started taking losartan  25 mg twice daily instead of 50 mg in the morning and 25 mg in the evening.  Was feeling weak after her morning dose prior to the reduction.  No reported spontaneous bleeding problems on Eliquis .  No palpitations to suggest significant recurring atrial fibrillation.  I did review her interval lab work.  I reviewed her ECG today which shows sinus rhythm with IVCD. Last echocardiogram was in 2023.  We discussed getting an updated study.  Physical Exam: VS:  BP 132/70 (BP Location: Right Arm)   Pulse 76   Ht 5' 3 (1.6 m)   Wt 171 lb 3.2 oz (77.7 kg)   SpO2 94%   BMI 30.33 kg/m , BMI Body mass index is 30.33 kg/m.  Wt Readings from Last 3 Encounters:  05/21/24 171 lb 3.2 oz (77.7 kg)  03/10/24 171 lb 6.4 oz (77.7 kg)  12/18/23 171 lb (77.6 kg)    General: Patient appears comfortable at rest. HEENT: Conjunctiva and lids normal. Neck: Supple, no elevated JVP or carotid bruits. Lungs: Clear to auscultation, nonlabored breathing at rest. Cardiac: Regular rate and rhythm, no S3, 1/6 systolic murmur, no pericardial rub. Extremities: No pitting edema.  ECG:  An ECG dated 03/07/2023 was personally reviewed today and demonstrated:  Sinus rhythm with IVCD and PACs, repolarization changes.  Labwork: 03/03/2024: ALT 17; AST 16; BUN 33; Creatinine, Ser 1.31; Hemoglobin 12.2; Platelets 191; Potassium 4.2; Sodium 134     Component Value Date/Time   CHOL 254 (H) 09/27/2023 1220   TRIG 289  (H) 09/27/2023 1220   HDL 52 09/27/2023 1220   CHOLHDL 4.9 (H) 09/27/2023 1220   CHOLHDL 4.5 10/14/2021 0342   VLDL 28 10/14/2021 0342   LDLCALC 149 (H) 09/27/2023 1220   Other Studies Reviewed Today:  No interval cardiac testing for review today.  Assessment and Plan:  1.  HFrecEF with history of nonischemic cardiomyopathy and normalization of LVEF in the range of 55 to 60% by echocardiogram in March 2023.  Plan to update echocardiogram.  He does report more shortness of breath, although attributing it to the change in weather and allergies.  No peripheral edema or increase in weight.  She has had some element of orthopnea however.  Currently on losartan  25 mg twice daily, Aldactone  12.5 mg daily, Lasix  10 mg once or twice daily and Coreg  6.25 mg twice daily.   2.  Paroxysmal atrial fibrillation with CHA2DS2-VASc score of 7.  No interval palpitations.  She is in sinus rhythm today by ECG.  Continue Eliquis  5 mg twice daily for stroke prophylaxis.  I reviewed her lab work from August.   3.  Mixed hyperlipidemia with statin myalgias including both Lipitor and Crestor .  She has not wanted to consider other treatment options.  LDL 149 in February.  Disposition:  Follow up 6 months.  Signed, [name], M.D., F.A.C.C. [facility]

## 2024-05-21 NOTE — Patient Instructions (Signed)
 Medication Instructions:  Your physician recommends that you continue on your current medications as directed. Please refer to the Current Medication list given to you today.   Labwork: None today  Testing/Procedures: Your physician has requested that you have an echocardiogram. Echocardiography is a painless test that uses sound waves to create images of your heart. It provides your doctor with information about the size and shape of your heart and how well your heart's chambers and valves are working. This procedure takes approximately one hour. There are no restrictions for this procedure. Please do NOT wear cologne, perfume, aftershave, or lotions (deodorant is allowed). Please arrive 15 minutes prior to your appointment time.  Please note: We ask at that you not bring children with you during ultrasound (echo/ vascular) testing. Due to room size and safety concerns, children are not allowed in the ultrasound rooms during exams. Our front office staff cannot provide observation of children in our lobby area while testing is being conducted. An adult accompanying a patient to their appointment will only be allowed in the ultrasound room at the discretion of the ultrasound technician under special circumstances. We apologize for any inconvenience.   Follow-Up: 6 months  Any Other Special Instructions Will Be Listed Below (If Applicable).  If you need a refill on your cardiac medications before your next appointment, please call your pharmacy.

## 2024-06-06 LAB — LAB REPORT - SCANNED
A1c: 7.6
Creatinine, POC: 22.6 mg/dL
EGFR: 41

## 2024-06-19 ENCOUNTER — Ambulatory Visit: Admitting: Family Medicine

## 2024-06-19 ENCOUNTER — Encounter: Payer: Self-pay | Admitting: Family Medicine

## 2024-06-19 VITALS — BP 130/50 | HR 79 | Temp 97.5°F | Ht 63.0 in | Wt 170.0 lb

## 2024-06-19 DIAGNOSIS — E1122 Type 2 diabetes mellitus with diabetic chronic kidney disease: Secondary | ICD-10-CM | POA: Diagnosis not present

## 2024-06-19 DIAGNOSIS — N1832 Chronic kidney disease, stage 3b: Secondary | ICD-10-CM

## 2024-06-19 DIAGNOSIS — I10 Essential (primary) hypertension: Secondary | ICD-10-CM

## 2024-06-19 DIAGNOSIS — E785 Hyperlipidemia, unspecified: Secondary | ICD-10-CM

## 2024-06-19 DIAGNOSIS — Z7984 Long term (current) use of oral hypoglycemic drugs: Secondary | ICD-10-CM

## 2024-06-19 NOTE — Patient Instructions (Signed)
Follow up in 6 months.  Take care  Dr. Wren Gallaga  

## 2024-06-20 ENCOUNTER — Ambulatory Visit (HOSPITAL_COMMUNITY)

## 2024-06-22 ENCOUNTER — Encounter: Payer: Self-pay | Admitting: Family Medicine

## 2024-06-22 MED ORDER — DAPAGLIFLOZIN PROPANEDIOL 5 MG PO TABS
5.0000 mg | ORAL_TABLET | Freq: Every day | ORAL | Status: AC
Start: 2024-06-22 — End: ?

## 2024-06-22 NOTE — Assessment & Plan Note (Signed)
 At goal given age. Continue Janumet  and Farxiga .

## 2024-06-22 NOTE — Assessment & Plan Note (Signed)
 Stable.  Continue current medications.

## 2024-06-22 NOTE — Assessment & Plan Note (Signed)
 Not at goal.  Declines treatment.

## 2024-06-22 NOTE — Progress Notes (Signed)
 Subjective:  Patient ID: Leslie Kim, female    DOB: 1936/06/24  Age: 88 y.o. MRN: [id number]  CC:   Chief Complaint  Patient presents with   6 month follow up    Discuss results from [facility]     HPI:  88 year old female presents for follow up.  Patient states that she is doing well.   Creatinine has recently improved (currently 1.29; GFR 41). She does have significant proteinuria (Urine Protein/Creatinine ratio 1208).  She is on Losartan , Spironolactone . Nephrology has started Farxiga .   A1c 7.6.  Compliant with Janumet . Farxiga  just started.  Needs foot exam today.  Lipids uncontrolled. She doesn't want treatment. We will discuss today.  Patient Active Problem List   Diagnosis Date Noted   Vitamin B12 deficiency 03/10/2024   MGUS (monoclonal gammopathy of unknown significance) 07/02/2023   Iron deficiency 07/02/2023   Hepatic steatosis 09/15/2022   History of stroke 12/09/2021   Hyperlipidemia 12/09/2021   Essential hypertension    Chronic kidney disease 12/12/2019   Chronic obstructive pulmonary disease (HCC) 10/21/2019   Gastroesophageal reflux disease without esophagitis 10/21/2019   Atrial fibrillation (HCC) 01/07/2016   Nonischemic cardiomyopathy (HCC) 12/24/2015   Diabetes mellitus (HCC) 12/18/2015    Social Hx   Social History   Socioeconomic History   Marital status: Widowed    Spouse name: Not on file   Number of children: Not on file   Years of education: Not on file   Highest education level: Not on file  Occupational History   Not on file  Tobacco Use   Smoking status: Former    Current packs/day: 0.00    Average packs/day: 1 pack/day for 40.0 years (40.0 ttl pk-yrs)    Types: Cigarettes    Start date: 12/18/1967    Quit date: 12/18/2007    Years since quitting: 16.5   Smokeless tobacco: Never   Tobacco comments:    Quit for 8 years  Vaping Use   Vaping status: Never Used  Substance and Sexual Activity   Alcohol use: No     Alcohol/week: 0.0 standard drinks of alcohol   Drug use: No   Sexual activity: Yes  Other Topics Concern   Not on file  Social History Narrative   Not on file   Social Drivers of Health   Financial Resource Strain: Low Risk  (02/09/2023)   Overall Financial Resource Strain (CARDIA)    Difficulty of Paying Living Expenses: Not hard at all  Food Insecurity: No Food Insecurity (02/09/2023)   Hunger Vital Sign    Worried About Running Out of Food in the Last Year: Never true    Ran Out of Food in the Last Year: Never true  Transportation Needs: No Transportation Needs (02/09/2023)   PRAPARE - Administrator, Civil Service (Medical): No    Lack of Transportation (Non-Medical): No  Physical Activity: Insufficiently Active (01/24/2022)   Exercise Vital Sign    Days of Exercise per Week: 5 days    Minutes of Exercise per Session: 20 min  Stress: No Stress Concern Present (02/09/2023)   [employer] of Occupational Health - Occupational Stress Questionnaire    Feeling of Stress : Not at all  Social Connections: Socially Integrated (02/09/2023)   Social Connection and Isolation Panel    Frequency of Communication with Friends and Family: More than three times a week    Frequency of Social Gatherings with Friends and Family: More than three times a week  Attends Religious Services: More than 4 times per year    Active Member of Clubs or Organizations: Yes    Attends Banker Meetings: More than 4 times per year    Marital Status: Married    Review of Systems Per HPI  Objective:  BP (!) 130/50   Pulse 79   Temp (!) 97.5 F (36.4 C)   Ht 5' 3 (1.6 m)   Wt 170 lb (77.1 kg)   SpO2 96%   BMI 30.11 kg/m      06/19/2024    1:28 PM 05/21/2024    1:05 PM 05/21/2024   12:53 PM  BP/Weight  Systolic BP 130 132 154  Diastolic BP 50 70 70  Wt. (Lbs) 170  171.2  BMI 30.11 kg/m2  30.33 kg/m2    Physical Exam Vitals and nursing note reviewed.   Constitutional:      General: She is not in acute distress.    Appearance: Normal appearance.  HENT:     Head: Normocephalic and atraumatic.  Eyes:     General:        Right eye: No discharge.        Left eye: No discharge.     Conjunctiva/sclera: Conjunctivae normal.  Pulmonary:     Effort: Pulmonary effort is normal.     Breath sounds: Normal breath sounds.  Neurological:     Mental Status: She is alert.  Psychiatric:        Mood and Affect: Mood normal.        Behavior: Behavior normal.     Lab Results  Component Value Date   WBC 8.8 03/03/2024   HGB 12.2 03/03/2024   HCT 38.7 03/03/2024   PLT 191 03/03/2024   GLUCOSE 141 (H) 03/03/2024   CHOL 254 (H) 09/27/2023   TRIG 289 (H) 09/27/2023   HDL 52 09/27/2023   LDLCALC 149 (H) 09/27/2023   ALT 17 03/03/2024   AST 16 03/03/2024   NA 134 (L) 03/03/2024   K 4.2 03/03/2024   CL 100 03/03/2024   CREATININE 1.31 (H) 03/03/2024   BUN 33 (H) 03/03/2024   CO2 22 03/03/2024   TSH 3.460 11/20/2019   INR 1.2 10/13/2021   HGBA1C 7.3 (H) 09/27/2023     Assessment & Plan:  Essential hypertension Assessment & Plan: Stable. Continue current medications.   Type 2 diabetes mellitus with stage 3b chronic kidney disease, without long-term current use of insulin  [facility]) Assessment & Plan: At goal given age. Continue Janumet  and Farxiga .  Orders: -     Dapagliflozin  Propanediol; Take 1 tablet (5 mg total) by mouth daily.  Hyperlipidemia, unspecified hyperlipidemia type Assessment & Plan: Not at goal. Declines treatment.   Stage 3b chronic kidney disease (HCC) -     Dapagliflozin  Propanediol; Take 1 tablet (5 mg total) by mouth daily.   Follow-up:  6 months  Wheeler Incorvaia Bluford DO [facility]

## 2024-07-04 ENCOUNTER — Ambulatory Visit

## 2024-08-06 ENCOUNTER — Ambulatory Visit (HOSPITAL_COMMUNITY)
Admission: RE | Admit: 2024-08-06 | Discharge: 2024-08-06 | Disposition: A | Discharge status: Home / Self Care | Source: Ambulatory Visit | Attending: Cardiology | Admitting: Cardiology

## 2024-08-06 ENCOUNTER — Ambulatory Visit: Payer: Self-pay | Admitting: Cardiology

## 2024-08-06 DIAGNOSIS — R0602 Shortness of breath: Secondary | ICD-10-CM | POA: Insufficient documentation

## 2024-08-06 LAB — ECHOCARDIOGRAM COMPLETE
AR max vel: 2.88 cm2
AV Area VTI: 2.89 cm2
AV Area mean vel: 2.82 cm2
AV Mean grad: 4 mmHg
AV Peak grad: 8 mmHg
Ao pk vel: 1.41 m/s
Area-P 1/2: 2.48 cm2
Calc EF: 62.9 %
P 1/2 time: 447 ms
S' Lateral: 2.7 cm
Single Plane A2C EF: 65.2 %
Single Plane A4C EF: 54 %

## 2024-08-06 NOTE — Progress Notes (Signed)
*  PRELIMINARY RESULTS* Echocardiogram 2D Echocardiogram has been performed.  Leslie Kim 08/06/2024, 2:01 PM

## 2024-08-14 ENCOUNTER — Ambulatory Visit

## 2024-08-14 VITALS — Ht 63.0 in | Wt 170.0 lb

## 2024-08-14 DIAGNOSIS — Z5320 Procedure and treatment not carried out because of patient's decision for unspecified reasons: Secondary | ICD-10-CM

## 2024-08-14 DIAGNOSIS — Z0000 Encounter for general adult medical examination without abnormal findings: Secondary | ICD-10-CM | POA: Diagnosis not present

## 2024-08-14 DIAGNOSIS — Z2821 Immunization not carried out because of patient refusal: Secondary | ICD-10-CM

## 2024-08-14 NOTE — Patient Instructions (Signed)
 Leslie Kim,  Thank you for taking the time for your Medicare Wellness Visit. I appreciate your continued commitment to your health goals. Please review the care plan we discussed, and feel free to reach out if I can assist you further.  Please note that Annual Wellness Visits do not include a physical exam. Some assessments may be limited, especially if the visit was conducted virtually. If needed, we may recommend an in-person follow-up with your provider.  Ongoing Care Seeing your primary care provider every 3 to 6 months helps us  monitor your health and provide consistent, personalized care.   1 year follow up for Medicare well visit: August 20, 2025 at 8:00 am with medicare wellness nurse in office  Recommended Screenings:  Health Maintenance  Topic Date Due   Zoster (Shingles) Vaccine (1 of 2) Never done   Medicare Annual Wellness Visit  02/09/2024   Flu Shot  10/28/2024*   DTaP/Tdap/Td vaccine (1 - Tdap) 12/18/2024*   Osteoporosis screening with Bone Density Scan  12/18/2024*   Hemoglobin A1C  12/03/2024   Eye exam for diabetics  04/08/2025   Complete foot exam   06/19/2025   Pneumococcal Vaccine for age over 47  Completed   Meningitis B Vaccine  Aged Out   COVID-19 Vaccine  Discontinued  *Topic was postponed. The date shown is not the original due date.       08/14/2024    9:15 AM  Advanced Directives  Does Patient Have a Medical Advance Directive? Yes  Type of Estate Agent of [place];Living will  Copy of Healthcare Power of Attorney in Chart? Yes - validated most recent copy scanned in chart (See row information)    Vision: Annual vision screenings are recommended for early detection of glaucoma, cataracts, and diabetic retinopathy. These exams can also reveal signs of chronic conditions such as diabetes and high blood pressure.  Dental: Annual dental screenings help detect early signs of oral cancer, gum disease, and other conditions linked to  overall health, including heart disease and diabetes.  Please see the attached documents for additional preventive care recommendations.

## 2024-08-14 NOTE — Progress Notes (Signed)
 "  Chief Complaint  Patient presents with   Medicare Wellness     Subjective:   Leslie Kim is a 89 y.o. female who presents for a Medicare Annual Wellness Visit.  Visit info / Clinical Intake: Medicare Wellness Visit Type:: Subsequent Annual Wellness Visit Persons participating in visit and providing information:: patient Medicare Wellness Visit Mode:: Telephone If telephone:: video error Since this visit was completed virtually, some vitals may be partially provided or unavailable. Missing vitals are due to the limitations of the virtual format.: Documented vitals are patient reported If Telephone or Video please confirm:: I connected with patient using audio/video enable telemedicine. I verified patient identity with two identifiers, discussed telehealth limitations, and patient agreed to proceed. Patient Location:: home Provider Location:: home office Interpreter Needed?: No Pre-visit prep was completed: yes AWV questionnaire completed by patient prior to visit?: no Living arrangements:: with family/others Patient's Overall Health Status Rating: good Typical amount of pain: none Does pain affect daily life?: no Are you currently prescribed opioids?: no  Dietary Habits and Nutritional Risks How many meals a day?: 3 Eats fruit and vegetables daily?: yes Most meals are obtained by: preparing own meals In the last 2 weeks, have you had any of the following?: none Diabetic:: (!) yes Any non-healing wounds?: no How often do you check your BS?: 1 Would you like to be referred to a Nutritionist or for Diabetic Management? : no  Functional Status Activities of Daily Living (to include ambulation/medication): Independent Ambulation: Independent with device- listed below Home Assistive Devices/Equipment: [place] (as needed when she leaves the house) Medication Administration: Independent Home Management (perform basic housework or laundry): Needs assistance (comment) (children help  her with such things as mopping due to her breathing.) Manage your own finances?: yes Primary transportation is: family / friends Concerns about vision?: no *vision screening is required for WTM* Concerns about hearing?: no  Fall Screening Falls in the past year?: 0 Number of falls in past year: 0 Was there an injury with Fall?: 0 Fall Risk Category Calculator: 0 Patient Fall Risk Level: Low Fall Risk  Fall Risk Patient at Risk for Falls Due to: Impaired balance/gait Fall risk Follow up: Falls evaluation completed; Education provided; Falls prevention discussed  Home and Transportation Safety: All rugs have non-skid backing?: yes All stairs or steps have railings?: yes Grab bars in the bathtub or shower?: yes Have non-skid surface in bathtub or shower?: yes Good home lighting?: yes Regular seat belt use?: yes Hospital stays in the last year:: no  Cognitive Assessment Difficulty concentrating, remembering, or making decisions? : no Will 6CIT or Mini Cog be Completed: yes What year is it?: 0 points What month is it?: 0 points Give patient an address phrase to remember (5 components): [address] [place] About what time is it?: 0 points Count backwards from 20 to 1: 0 points Say the months of the year in reverse: 0 points Repeat the address phrase from earlier: 0 points 6 CIT Score: 0 points  Advance Directives (For Healthcare) Does Patient Have a Medical Advance Directive?: Yes Type of Advance Directive: Healthcare Power of [place]; Living will Copy of Healthcare Power of Attorney in Chart?: Yes - validated most recent copy scanned in chart (See row information) Copy of Living Will in Chart?: Yes - validated most recent copy scanned in chart (See row information) Would patient like information on creating a medical advance directive?: No - Patient declined  Reviewed/Updated  Reviewed/Updated: Reviewed All (Medical, Surgical, Family,  Medications, Allergies, Care  Teams, Patient Goals)    Allergies (verified) Codeine   Current Medications (verified) Outpatient Encounter Medications as of 08/14/2024  Medication Sig   Accu-Chek Softclix Lancets lancets Use to check blood sugars daily before meal dx E11.22, N18.32   acetaminophen  (TYLENOL ) 325 MG tablet Take 2 tablets (650 mg total) by mouth every 4 (four) hours as needed for mild pain (or temp > 37.5 C (99.5 F)).   albuterol  (PROVENTIL ) (2.5 MG/3ML) 0.083% nebulizer solution Take 3 mLs (2.5 mg total) by nebulization every 6 (six) hours as needed for wheezing or shortness of breath.   albuterol  (VENTOLIN  HFA) 108 (90 Base) MCG/ACT inhaler Inhale 2 puffs into the lungs every 6 (six) hours as needed for wheezing or shortness of breath.   b complex vitamins tablet Take 1 tablet by mouth every other day.   carvedilol  (COREG ) 6.25 MG tablet Take 1 tablet (6.25 mg total) by mouth 2 (two) times daily with a meal. TAKE 1 TABLET(6.25 MG) BY MOUTH TWICE DAILY WITH A MEAL   cholecalciferol  (VITAMIN D3) 25 MCG (1000 UNIT) tablet Take 2 tablets (2,000 Units total) by mouth daily.   ELIQUIS  5 MG TABS tablet TAKE 1 TABLET BY MOUTH TWICE DAILY   ferrous sulfate  324 MG TBEC Take 1 tablet (324 mg total) by mouth daily with breakfast.   Fluticasone -Umeclidin-Vilant (TRELEGY ELLIPTA ) 100-62.5-25 MCG/ACT AEPB INHALE 1 PUFF INTO THE LUNGS DAILY IN THE AFTERNOON   furosemide  (LASIX ) 20 MG tablet 1/2 tab daily (Patient taking differently: Take 10 mg by mouth 2 (two) times daily. 1/2 tab daily)   glucose blood (ACCU-CHEK GUIDE TEST) test strip USE TO CHECK BLOOD SUGAR DAILY   JANUMET  50-500 MG tablet TAKE 1 TABLET BY MOUTH DAILY   losartan  (COZAAR ) 50 MG tablet TAKE 1 AND 1/2 TABLETS(75 MG) BY MOUTH DAILY   Magnesium  250 MG TABS Take 1 tablet by mouth daily.   melatonin 3 MG TABS tablet Take 2 tablets (6 mg total) by mouth at bedtime.   pantoprazole  (PROTONIX ) 40 MG tablet TAKE 1 TABLET(40 MG) BY MOUTH DAILY   spironolactone   (ALDACTONE ) 25 MG tablet TAKE 1/2 TABLET(12.5 MG) BY MOUTH EVERY DAY   valACYclovir  (VALTREX ) 1000 MG tablet Take 1 tablet (1,000 mg total) by mouth 2 (two) times daily.   dapagliflozin  propanediol (FARXIGA ) 5 MG TABS tablet Take 1 tablet (5 mg total) by mouth daily. (Patient not taking: Reported on 08/14/2024)   No facility-administered encounter medications on file as of 08/14/2024.    History: Past Medical History:  Diagnosis Date   Allergy    Anemia in chronic kidney disease 12/12/2019   Cataract    COPD (chronic obstructive pulmonary disease) (HCC)    Essential hypertension    New onset atrial fibrillation (HCC) 11/2015   a. s/p DCCV on 12/18/2015 b. started on Eliquis    Nonischemic cardiomyopathy (HCC)    a. 11/2015: echo showing EF of 30-35%, cath with nonobstructive dz   Type 2 diabetes mellitus (HCC)    Past Surgical History:  Procedure Laterality Date   CARDIAC CATHETERIZATION N/A 12/23/2015   Procedure: Right/Left Heart Cath and Coronary Angiography;  Surgeon: Ezra GORMAN Shuck, MD;  Location: [facility] INVASIVE CV LAB;  Service: Cardiovascular;  Laterality: N/A;   cataract surgery     CHOLECYSTECTOMY     Family History  Problem Relation Age of Onset   Diabetes Mother    Diabetes Father    Cancer Father    Prostate cancer Father  Diabetes Brother    Diabetes Brother    Diabetes Brother    Diabetes Daughter    Diabetes Son    Social History   Occupational History   Not on file  Tobacco Use   Smoking status: Former    Current packs/day: 0.00    Average packs/day: 1 pack/day for 40.0 years (40.0 ttl pk-yrs)    Types: Cigarettes    Start date: 12/18/1967    Quit date: 12/18/2007    Years since quitting: 16.6   Smokeless tobacco: Never   Tobacco comments:    Quit for 8 years  Vaping Use   Vaping status: Never Used  Substance and Sexual Activity   Alcohol use: No    Alcohol/week: 0.0 standard drinks of alcohol   Drug use: No   Sexual activity: Yes   Tobacco  Counseling Counseling given: Yes Tobacco comments: Quit for 8 years  SDOH Screenings   Food Insecurity: No Food Insecurity (08/14/2024)  Housing: Low Risk (08/14/2024)  Transportation Needs: No Transportation Needs (08/14/2024)  Utilities: Not At Risk (08/14/2024)  Alcohol Screen: Low Risk (02/09/2023)  Depression (PHQ2-9): Low Risk (08/14/2024)  Financial Resource Strain: Low Risk (02/09/2023)  Physical Activity: Sufficiently Active (08/14/2024)  Social Connections: Socially Isolated (08/14/2024)  Stress: No Stress Concern Present (08/14/2024)  Tobacco Use: Medium Risk (08/14/2024)  Health Literacy: Adequate Health Literacy (08/14/2024)   See flowsheets for full screening details  Depression Screen PHQ 2 & 9 Depression Scale- Over the past 2 weeks, how often have you been bothered by any of the following problems? Little interest or pleasure in doing things: 0 Feeling down, depressed, or hopeless (PHQ Adolescent also includes...irritable): 0 PHQ-2 Total Score: 0 Trouble falling or staying asleep, or sleeping too much: 0 Feeling tired or having little energy: 0 Poor appetite or overeating (PHQ Adolescent also includes...weight loss): 0 Feeling bad about yourself - or that you are a failure or have let yourself or your family down: 0 Trouble concentrating on things, such as reading the newspaper or watching television (PHQ Adolescent also includes...like school work): 0 Moving or speaking so slowly that other people could have noticed. Or the opposite - being so fidgety or restless that you have been moving around a lot more than usual: 0 Thoughts that you would be better off dead, or of hurting yourself in some way: 0 PHQ-9 Total Score: 0 If you checked off any problems, how difficult have these problems made it for you to do your work, take care of things at home, or get along with other people?: Not difficult at all  Depression Treatment Depression Interventions/Treatment : EYV7-0 Score <4  Follow-up Not Indicated     Goals Addressed               This Visit's Progress     Patient Stated (pt-stated)   On track     Patient stated her goal is to remain active and independent.              Objective:    Today's Vitals   08/14/24 0912  Weight: 170 lb (77.1 kg)  Height: 5' 3 (1.6 m)   Body mass index is 30.11 kg/m.  Hearing/Vision screen Hearing Screening - Comments:: Patient denies any hearing difficulties.   Vision Screening - Comments:: Wears rx glasses - up to date with routine eye exams with  Oneil Kawasaki  Immunizations and Health Maintenance Health Maintenance  Topic Date Due   Zoster Vaccines- Shingrix (1 of 2)  Never done   Medicare Annual Wellness (AWV)  02/09/2024   Influenza Vaccine  10/28/2024 (Originally 02/29/2024)   DTaP/Tdap/Td (1 - Tdap) 12/18/2024 (Originally 06/27/1955)   Bone Density Scan  12/18/2024 (Originally 06/28/2003)   HEMOGLOBIN A1C  12/03/2024   OPHTHALMOLOGY EXAM  04/08/2025   FOOT EXAM  06/19/2025   Pneumococcal Vaccine: 50+ Years  Completed   Meningococcal B Vaccine  Aged Out   COVID-19 Vaccine  Discontinued        Assessment/Plan:  This is a routine wellness examination for [place].  Patient Care Team: Cook, Jayce G, DO as PCP - General (Family Medicine) Shaaron, Lamar HERO, MD as Consulting Physician (Gastroenterology) Fernande Lamar LABOR, DPM as Referring Physician (Podiatry) Debera Jayson MATSU, MD as Consulting Physician (Cardiology) [facility]  (Optometry) Darroll Anes, DO (Optometry) Rachele Gaynell RAMAN, MD as Referring Physician (Nephrology) Geofm Delon BRAVO, NP as Nurse Practitioner (Oncology) Lamon Pleasant HERO DEVONNA as Physician Assistant (Oncology)  I have personally reviewed and noted the following in the patients chart:   Medical and social history Use of alcohol, tobacco or illicit drugs  Current medications and supplements including opioid prescriptions. Functional  ability and status Nutritional status Physical activity Advanced directives List of other physicians Hospitalizations, surgeries, and ER visits in previous 12 months Vitals Screenings to include cognitive, depression, and falls Referrals and appointments  No orders of the defined types were placed in this encounter.  In addition, I have reviewed and discussed with patient certain preventive protocols, quality metrics, and best practice recommendations. A written personalized care plan for preventive services as well as general preventive health recommendations were provided to patient.   Ermias Tomeo, CMA   08/14/2024   Return August 20, 2025 at 8:00 am.  After Visit Summary: (MyChart) Due to this being a telephonic visit, the after visit summary with patients personalized plan was offered to patient via MyChart    "

## 2024-11-24 ENCOUNTER — Ambulatory Visit: Admitting: Cardiology

## 2024-12-17 ENCOUNTER — Ambulatory Visit: Admitting: Family Medicine

## 2025-03-09 ENCOUNTER — Other Ambulatory Visit

## 2025-03-16 ENCOUNTER — Ambulatory Visit: Admitting: Physician Assistant

## 2025-08-20 ENCOUNTER — Ambulatory Visit
